# Patient Record
Sex: Male | Born: 1960
Health system: Southern US, Community
[De-identification: ages and names within clinical notes are randomized; demographics above are authoritative.]

## PROBLEM LIST (undated history)

## (undated) DIAGNOSIS — N529 Male erectile dysfunction, unspecified: Secondary | ICD-10-CM

## (undated) DIAGNOSIS — E8881 Metabolic syndrome: Secondary | ICD-10-CM

## (undated) DIAGNOSIS — R7303 Prediabetes: Secondary | ICD-10-CM

## (undated) DIAGNOSIS — N138 Other obstructive and reflux uropathy: Secondary | ICD-10-CM

## (undated) DIAGNOSIS — I2510 Atherosclerotic heart disease of native coronary artery without angina pectoris: Secondary | ICD-10-CM

## (undated) DIAGNOSIS — K5792 Diverticulitis of intestine, part unspecified, without perforation or abscess without bleeding: Secondary | ICD-10-CM

## (undated) DIAGNOSIS — T8859XA Other complications of anesthesia, initial encounter: Secondary | ICD-10-CM

## (undated) DIAGNOSIS — E7800 Pure hypercholesterolemia, unspecified: Secondary | ICD-10-CM

## (undated) DIAGNOSIS — J310 Chronic rhinitis: Secondary | ICD-10-CM

## (undated) DIAGNOSIS — Z7251 High risk heterosexual behavior: Secondary | ICD-10-CM

## (undated) DIAGNOSIS — C801 Malignant (primary) neoplasm, unspecified: Secondary | ICD-10-CM

## (undated) DIAGNOSIS — D573 Sickle-cell trait: Secondary | ICD-10-CM

## (undated) DIAGNOSIS — H269 Unspecified cataract: Secondary | ICD-10-CM

## (undated) DIAGNOSIS — F17200 Nicotine dependence, unspecified, uncomplicated: Secondary | ICD-10-CM

## (undated) DIAGNOSIS — N401 Enlarged prostate with lower urinary tract symptoms: Secondary | ICD-10-CM

## (undated) DIAGNOSIS — G4730 Sleep apnea, unspecified: Secondary | ICD-10-CM

## (undated) DIAGNOSIS — G4733 Obstructive sleep apnea (adult) (pediatric): Secondary | ICD-10-CM

## (undated) DIAGNOSIS — N400 Enlarged prostate without lower urinary tract symptoms: Secondary | ICD-10-CM

## (undated) DIAGNOSIS — M1990 Unspecified osteoarthritis, unspecified site: Secondary | ICD-10-CM

## (undated) DIAGNOSIS — D571 Sickle-cell disease without crisis: Secondary | ICD-10-CM

## (undated) HISTORY — DX: Chronic rhinitis: J31.0

## (undated) HISTORY — DX: Atherosclerotic heart disease of native coronary artery without angina pectoris: I25.10

## (undated) HISTORY — PX: HERNIA REPAIR: SHX51

## (undated) HISTORY — DX: Pure hypercholesterolemia, unspecified: E78.00

## (undated) HISTORY — DX: Metabolic syndrome: E88.810

## (undated) HISTORY — PX: INCISION AND DRAINAGE ABSCESS / HEMATOMA OF BURSA / KNEE / THIGH: SUR668

## (undated) HISTORY — DX: Metabolic syndrome: E88.81

## (undated) HISTORY — DX: Male erectile dysfunction, unspecified: N52.9

## (undated) HISTORY — PX: COSMETIC SURGERY: SHX468

## (undated) HISTORY — DX: Nicotine dependence, unspecified, uncomplicated: F17.200

## (undated) HISTORY — DX: Benign prostatic hyperplasia with lower urinary tract symptoms: N13.8

## (undated) HISTORY — DX: Obstructive sleep apnea (adult) (pediatric): G47.33

## (undated) HISTORY — DX: Benign prostatic hyperplasia with lower urinary tract symptoms: N40.1

## (undated) HISTORY — DX: Sleep apnea, unspecified: G47.30

## (undated) HISTORY — DX: High risk heterosexual behavior: Z72.51

## (undated) HISTORY — DX: Unspecified cataract: H26.9

## (undated) HISTORY — PX: CATARACT EXTRACTION: SUR2

---

## 1998-07-26 ENCOUNTER — Emergency Department (HOSPITAL_COMMUNITY): Admission: EM | Admit: 1998-07-26 | Discharge: 1998-07-26 | Payer: Self-pay | Admitting: Emergency Medicine

## 1999-10-14 ENCOUNTER — Encounter (INDEPENDENT_AMBULATORY_CARE_PROVIDER_SITE_OTHER): Payer: Self-pay | Admitting: *Deleted

## 1999-10-14 ENCOUNTER — Ambulatory Visit (HOSPITAL_BASED_OUTPATIENT_CLINIC_OR_DEPARTMENT_OTHER): Admission: RE | Admit: 1999-10-14 | Discharge: 1999-10-14 | Payer: Self-pay | Admitting: General Surgery

## 2001-08-03 ENCOUNTER — Observation Stay (HOSPITAL_COMMUNITY): Admission: EM | Admit: 2001-08-03 | Discharge: 2001-08-04 | Payer: Self-pay | Admitting: Emergency Medicine

## 2002-07-14 ENCOUNTER — Emergency Department (HOSPITAL_COMMUNITY): Admission: EM | Admit: 2002-07-14 | Discharge: 2002-07-14 | Payer: Self-pay | Admitting: Emergency Medicine

## 2003-12-10 ENCOUNTER — Ambulatory Visit (HOSPITAL_BASED_OUTPATIENT_CLINIC_OR_DEPARTMENT_OTHER): Admission: RE | Admit: 2003-12-10 | Discharge: 2003-12-10 | Payer: Self-pay | Admitting: General Surgery

## 2003-12-10 ENCOUNTER — Ambulatory Visit (HOSPITAL_COMMUNITY): Admission: RE | Admit: 2003-12-10 | Discharge: 2003-12-10 | Payer: Self-pay | Admitting: General Surgery

## 2004-01-07 ENCOUNTER — Emergency Department (HOSPITAL_COMMUNITY): Admission: EM | Admit: 2004-01-07 | Discharge: 2004-01-08 | Payer: Self-pay | Admitting: Emergency Medicine

## 2004-01-10 ENCOUNTER — Emergency Department (HOSPITAL_COMMUNITY): Admission: EM | Admit: 2004-01-10 | Discharge: 2004-01-11 | Payer: Self-pay

## 2004-01-11 ENCOUNTER — Emergency Department (HOSPITAL_COMMUNITY): Admission: EM | Admit: 2004-01-11 | Discharge: 2004-01-11 | Payer: Self-pay | Admitting: Emergency Medicine

## 2004-01-12 ENCOUNTER — Ambulatory Visit (HOSPITAL_COMMUNITY): Admission: RE | Admit: 2004-01-12 | Discharge: 2004-01-12 | Payer: Self-pay | Admitting: Internal Medicine

## 2004-02-07 ENCOUNTER — Emergency Department (HOSPITAL_COMMUNITY): Admission: EM | Admit: 2004-02-07 | Discharge: 2004-02-07 | Payer: Self-pay | Admitting: Emergency Medicine

## 2004-04-10 ENCOUNTER — Emergency Department (HOSPITAL_COMMUNITY): Admission: EM | Admit: 2004-04-10 | Discharge: 2004-04-10 | Payer: Self-pay | Admitting: Emergency Medicine

## 2004-08-20 ENCOUNTER — Ambulatory Visit (HOSPITAL_COMMUNITY): Admission: RE | Admit: 2004-08-20 | Discharge: 2004-08-20 | Payer: Self-pay | Admitting: General Surgery

## 2004-08-20 ENCOUNTER — Encounter (INDEPENDENT_AMBULATORY_CARE_PROVIDER_SITE_OTHER): Payer: Self-pay | Admitting: Specialist

## 2004-09-12 ENCOUNTER — Emergency Department (HOSPITAL_COMMUNITY): Admission: EM | Admit: 2004-09-12 | Discharge: 2004-09-12 | Payer: Self-pay | Admitting: Emergency Medicine

## 2004-09-13 ENCOUNTER — Observation Stay (HOSPITAL_COMMUNITY): Admission: AD | Admit: 2004-09-13 | Discharge: 2004-09-14 | Payer: Self-pay | Admitting: General Surgery

## 2005-06-14 ENCOUNTER — Ambulatory Visit: Payer: Self-pay | Admitting: Internal Medicine

## 2005-09-14 ENCOUNTER — Ambulatory Visit: Payer: Self-pay | Admitting: Internal Medicine

## 2005-10-07 ENCOUNTER — Ambulatory Visit: Payer: Self-pay | Admitting: Internal Medicine

## 2005-10-19 ENCOUNTER — Ambulatory Visit: Payer: Self-pay | Admitting: Internal Medicine

## 2005-10-19 ENCOUNTER — Ambulatory Visit: Payer: Self-pay

## 2005-10-26 ENCOUNTER — Ambulatory Visit: Payer: Self-pay | Admitting: *Deleted

## 2005-10-31 ENCOUNTER — Ambulatory Visit: Payer: Self-pay | Admitting: *Deleted

## 2005-11-04 ENCOUNTER — Inpatient Hospital Stay (HOSPITAL_BASED_OUTPATIENT_CLINIC_OR_DEPARTMENT_OTHER): Admission: RE | Admit: 2005-11-04 | Discharge: 2005-11-04 | Payer: Self-pay | Admitting: Cardiology

## 2005-11-04 ENCOUNTER — Ambulatory Visit: Payer: Self-pay | Admitting: Cardiology

## 2005-11-07 ENCOUNTER — Ambulatory Visit: Payer: Self-pay

## 2006-05-09 ENCOUNTER — Ambulatory Visit: Payer: Self-pay | Admitting: Endocrinology

## 2007-04-03 ENCOUNTER — Ambulatory Visit: Payer: Self-pay | Admitting: Internal Medicine

## 2007-04-03 LAB — CONVERTED CEMR LAB
Bilirubin, Direct: 0.1 mg/dL (ref 0.0–0.3)
Eosinophils Absolute: 0.2 10*3/uL (ref 0.0–0.6)
Eosinophils Relative: 2.9 % (ref 0.0–5.0)
GFR calc Af Amer: 94 mL/min
GFR calc non Af Amer: 78 mL/min
Glucose, Bld: 97 mg/dL (ref 70–99)
HCT: 43.6 % (ref 39.0–52.0)
Lymphocytes Relative: 21 % (ref 12.0–46.0)
MCV: 83.4 fL (ref 78.0–100.0)
Neutro Abs: 4.9 10*3/uL (ref 1.4–7.7)
Neutrophils Relative %: 64 % (ref 43.0–77.0)
Nitrite: NEGATIVE
PSA: 1.98 ng/mL (ref 0.10–4.00)
Platelets: 286 10*3/uL (ref 150–400)
Potassium: 4.2 meq/L (ref 3.5–5.1)
Sodium: 141 meq/L (ref 135–145)
Triglycerides: 131 mg/dL (ref 0–149)
Urine Glucose: NEGATIVE mg/dL
WBC: 7.6 10*3/uL (ref 4.5–10.5)

## 2007-04-13 ENCOUNTER — Ambulatory Visit: Payer: Self-pay | Admitting: Internal Medicine

## 2007-04-13 LAB — CONVERTED CEMR LAB

## 2007-05-23 ENCOUNTER — Ambulatory Visit: Payer: Self-pay | Admitting: Internal Medicine

## 2007-08-25 ENCOUNTER — Encounter: Payer: Self-pay | Admitting: *Deleted

## 2007-08-25 DIAGNOSIS — F528 Other sexual dysfunction not due to a substance or known physiological condition: Secondary | ICD-10-CM | POA: Insufficient documentation

## 2007-12-04 ENCOUNTER — Telehealth (INDEPENDENT_AMBULATORY_CARE_PROVIDER_SITE_OTHER): Payer: Self-pay | Admitting: *Deleted

## 2007-12-04 ENCOUNTER — Ambulatory Visit: Payer: Self-pay | Admitting: Internal Medicine

## 2007-12-04 DIAGNOSIS — J310 Chronic rhinitis: Secondary | ICD-10-CM | POA: Insufficient documentation

## 2008-04-04 ENCOUNTER — Ambulatory Visit: Payer: Self-pay | Admitting: Internal Medicine

## 2008-04-04 LAB — CONVERTED CEMR LAB
ALT: 23 units/L (ref 0–53)
AST: 20 units/L (ref 0–37)
Albumin: 4 g/dL (ref 3.5–5.2)
BUN: 9 mg/dL (ref 6–23)
Basophils Relative: 0.6 % (ref 0.0–1.0)
CO2: 30 meq/L (ref 19–32)
Chloride: 110 meq/L (ref 96–112)
Cholesterol: 187 mg/dL (ref 0–200)
Creatinine, Ser: 1.1 mg/dL (ref 0.4–1.5)
Eosinophils Absolute: 0.2 10*3/uL (ref 0.0–0.7)
Eosinophils Relative: 3.7 % (ref 0.0–5.0)
GFR calc non Af Amer: 77 mL/min
Glucose, Bld: 108 mg/dL — ABNORMAL HIGH (ref 70–99)
LDL Cholesterol: 131 mg/dL — ABNORMAL HIGH (ref 0–99)
MCV: 83.6 fL (ref 78.0–100.0)
Monocytes Relative: 11.4 % (ref 3.0–12.0)
Neutrophils Relative %: 53.9 % (ref 43.0–77.0)
PSA: 1.73 ng/mL (ref 0.10–4.00)
RBC: 5.13 M/uL (ref 4.22–5.81)
Specific Gravity, Urine: 1.025 (ref 1.000–1.03)
Total Protein, Urine: NEGATIVE mg/dL
Total Protein: 7.1 g/dL (ref 6.0–8.3)
Urine Glucose: NEGATIVE mg/dL
Urobilinogen, UA: 0.2 (ref 0.0–1.0)
WBC: 4.7 10*3/uL (ref 4.5–10.5)

## 2008-04-22 ENCOUNTER — Telehealth: Payer: Self-pay | Admitting: Internal Medicine

## 2008-05-02 ENCOUNTER — Encounter: Payer: Self-pay | Admitting: Internal Medicine

## 2008-07-25 ENCOUNTER — Ambulatory Visit: Payer: Self-pay | Admitting: Internal Medicine

## 2008-07-25 DIAGNOSIS — E785 Hyperlipidemia, unspecified: Secondary | ICD-10-CM | POA: Insufficient documentation

## 2008-07-29 ENCOUNTER — Ambulatory Visit: Payer: Self-pay | Admitting: Internal Medicine

## 2008-07-29 LAB — CONVERTED CEMR LAB
ALT: 45 units/L (ref 0–53)
Albumin: 3.4 g/dL — ABNORMAL LOW (ref 3.5–5.2)
Alkaline Phosphatase: 171 units/L — ABNORMAL HIGH (ref 39–117)
BUN: 13 mg/dL (ref 6–23)
Bilirubin, Direct: 0.1 mg/dL (ref 0.0–0.3)
CO2: 29 meq/L (ref 19–32)
Calcium: 8.8 mg/dL (ref 8.4–10.5)
Eosinophils Relative: 5.6 % — ABNORMAL HIGH (ref 0.0–5.0)
GFR calc Af Amer: 104 mL/min
Glucose, Bld: 127 mg/dL — ABNORMAL HIGH (ref 70–99)
HCT: 42.2 % (ref 39.0–52.0)
Hemoglobin: 13.9 g/dL (ref 13.0–17.0)
Lymphocytes Relative: 19.8 % (ref 12.0–46.0)
Monocytes Absolute: 0.7 10*3/uL (ref 0.1–1.0)
Monocytes Relative: 11.9 % (ref 3.0–12.0)
Neutro Abs: 3.4 10*3/uL (ref 1.4–7.7)
Potassium: 4 meq/L (ref 3.5–5.1)
RBC: 4.97 M/uL (ref 4.22–5.81)
Total CHOL/HDL Ratio: 8.1
Total Protein: 7.5 g/dL (ref 6.0–8.3)
VLDL: 15 mg/dL (ref 0–40)
WBC: 5.6 10*3/uL (ref 4.5–10.5)

## 2008-07-31 ENCOUNTER — Ambulatory Visit: Payer: Self-pay | Admitting: Internal Medicine

## 2008-07-31 DIAGNOSIS — N401 Enlarged prostate with lower urinary tract symptoms: Secondary | ICD-10-CM | POA: Insufficient documentation

## 2008-07-31 DIAGNOSIS — I2510 Atherosclerotic heart disease of native coronary artery without angina pectoris: Secondary | ICD-10-CM | POA: Insufficient documentation

## 2008-07-31 DIAGNOSIS — R351 Nocturia: Secondary | ICD-10-CM | POA: Insufficient documentation

## 2008-07-31 DIAGNOSIS — F17200 Nicotine dependence, unspecified, uncomplicated: Secondary | ICD-10-CM | POA: Insufficient documentation

## 2008-07-31 LAB — CONVERTED CEMR LAB
Bacteria, UA: NEGATIVE
Bilirubin Urine: NEGATIVE
Crystals: NEGATIVE
Hemoglobin, Urine: NEGATIVE
Squamous Epithelial / LPF: NEGATIVE /lpf
TSH: 1.04 microintl units/mL (ref 0.35–5.50)
Total Protein, Urine: NEGATIVE mg/dL
Urine Glucose: NEGATIVE mg/dL
Urobilinogen, UA: 0.2 (ref 0.0–1.0)
WBC, UA: NONE SEEN cells/hpf

## 2008-08-05 ENCOUNTER — Encounter: Payer: Self-pay | Admitting: Internal Medicine

## 2008-08-18 ENCOUNTER — Ambulatory Visit: Payer: Self-pay | Admitting: Internal Medicine

## 2008-08-18 ENCOUNTER — Encounter: Payer: Self-pay | Admitting: Internal Medicine

## 2008-08-18 LAB — CONVERTED CEMR LAB
ALT: 20 units/L (ref 0–53)
Albumin: 4.1 g/dL (ref 3.5–5.2)
Bilirubin, Direct: 0.2 mg/dL (ref 0.0–0.3)
Cholesterol: 132 mg/dL (ref 0–200)
HDL: 34.4 mg/dL — ABNORMAL LOW (ref >39.0)
LDL Cholesterol: 74 mg/dL (ref 0–99)
Total Protein: 7.7 g/dL (ref 6.0–8.3)
Triglycerides: 119 mg/dL (ref 0–149)
VLDL: 24 mg/dL (ref 0–40)

## 2008-09-15 ENCOUNTER — Encounter: Payer: Self-pay | Admitting: Internal Medicine

## 2008-10-03 ENCOUNTER — Telehealth: Payer: Self-pay | Admitting: Internal Medicine

## 2008-11-28 HISTORY — PX: PTCA: SHX146

## 2008-12-18 ENCOUNTER — Ambulatory Visit: Payer: Self-pay | Admitting: Internal Medicine

## 2008-12-18 DIAGNOSIS — E119 Type 2 diabetes mellitus without complications: Secondary | ICD-10-CM | POA: Insufficient documentation

## 2008-12-18 DIAGNOSIS — E8881 Metabolic syndrome: Secondary | ICD-10-CM | POA: Insufficient documentation

## 2008-12-18 LAB — CONVERTED CEMR LAB
AST: 22 units/L (ref 0–37)
Albumin: 4 g/dL (ref 3.5–5.2)
Alkaline Phosphatase: 94 units/L (ref 39–117)
BUN: 9 mg/dL (ref 6–23)
Basophils Relative: 1.3 % (ref 0.0–3.0)
Chloride: 109 meq/L (ref 96–112)
Cholesterol, target level: 200 mg/dL
Creatinine, Ser: 1.1 mg/dL (ref 0.4–1.5)
Creatinine,U: 144.1 mg/dL
Eosinophils Relative: 3.5 % (ref 0.0–5.0)
Glucose, Bld: 97 mg/dL (ref 70–99)
HCT: 43 % (ref 39.0–52.0)
HDL goal, serum: 40 mg/dL
HDL: 35.9 mg/dL — ABNORMAL LOW (ref >39.0)
LDL Goal: 70 mg/dL
Monocytes Absolute: 0.6 10*3/uL (ref 0.1–1.0)
Monocytes Relative: 12.2 % — ABNORMAL HIGH (ref 3.0–12.0)
Neutrophils Relative %: 54.2 % (ref 43.0–77.0)
Platelets: 243 10*3/uL (ref 150–400)
Potassium: 4.5 meq/L (ref 3.5–5.1)
RBC: 5.11 M/uL (ref 4.22–5.81)
Total CHOL/HDL Ratio: 5.4
Total Protein: 7.4 g/dL (ref 6.0–8.3)
WBC: 5.1 10*3/uL (ref 4.5–10.5)

## 2008-12-19 ENCOUNTER — Encounter: Payer: Self-pay | Admitting: Internal Medicine

## 2008-12-19 ENCOUNTER — Telehealth: Payer: Self-pay | Admitting: Internal Medicine

## 2008-12-31 ENCOUNTER — Ambulatory Visit: Payer: Self-pay | Admitting: Pulmonary Disease

## 2008-12-31 DIAGNOSIS — G4733 Obstructive sleep apnea (adult) (pediatric): Secondary | ICD-10-CM | POA: Insufficient documentation

## 2009-01-07 ENCOUNTER — Ambulatory Visit (HOSPITAL_BASED_OUTPATIENT_CLINIC_OR_DEPARTMENT_OTHER): Admission: RE | Admit: 2009-01-07 | Discharge: 2009-01-07 | Payer: Self-pay | Admitting: Pulmonary Disease

## 2009-01-07 ENCOUNTER — Encounter: Payer: Self-pay | Admitting: Pulmonary Disease

## 2009-01-14 ENCOUNTER — Ambulatory Visit: Payer: Self-pay | Admitting: Pulmonary Disease

## 2009-02-04 ENCOUNTER — Ambulatory Visit: Payer: Self-pay | Admitting: Pulmonary Disease

## 2009-04-01 ENCOUNTER — Ambulatory Visit: Payer: Self-pay | Admitting: Internal Medicine

## 2009-04-13 ENCOUNTER — Ambulatory Visit: Payer: Self-pay | Admitting: Internal Medicine

## 2009-05-18 ENCOUNTER — Telehealth: Payer: Self-pay | Admitting: Internal Medicine

## 2009-12-09 ENCOUNTER — Ambulatory Visit: Payer: Self-pay | Admitting: Internal Medicine

## 2009-12-10 ENCOUNTER — Encounter: Payer: Self-pay | Admitting: Internal Medicine

## 2009-12-11 ENCOUNTER — Ambulatory Visit: Payer: Self-pay | Admitting: Internal Medicine

## 2009-12-11 ENCOUNTER — Telehealth (INDEPENDENT_AMBULATORY_CARE_PROVIDER_SITE_OTHER): Payer: Self-pay | Admitting: *Deleted

## 2009-12-11 LAB — CONVERTED CEMR LAB
AST: 36 units/L (ref 0–37)
Alkaline Phosphatase: 105 units/L (ref 39–117)
Basophils Relative: 2.3 % (ref 0.0–3.0)
Bilirubin, Direct: 0.1 mg/dL (ref 0.0–0.3)
CO2: 16 meq/L — ABNORMAL LOW (ref 19–32)
Calcium: 9 mg/dL (ref 8.4–10.5)
Creatinine, Ser: 1.22 mg/dL (ref 0.40–1.50)
Eosinophils Relative: 5.2 % — ABNORMAL HIGH (ref 0.0–5.0)
GC Probe Amp, Urine: NEGATIVE
HCT: 43.2 % (ref 39.0–52.0)
HDL: 39 mg/dL — ABNORMAL LOW (ref >39)
Hemoglobin: 13.8 g/dL (ref 13.0–17.0)
Hgb A1c MFr Bld: 6.1 % (ref 4.6–6.5)
Ketones, ur: NEGATIVE mg/dL
Leukocytes, UA: NEGATIVE
Lymphs Abs: 1.4 10*3/uL (ref 0.7–4.0)
Monocytes Relative: 9.7 % (ref 3.0–12.0)
Neutro Abs: 4.1 10*3/uL (ref 1.4–7.7)
PSA: 2.08 ng/mL (ref 0.10–4.00)
Prolactin: 6.3 ng/mL
RBC: 4.98 M/uL (ref 4.22–5.81)
Specific Gravity, Urine: 1.02 (ref 1.000–1.030)
TSH: 1.67 microintl units/mL (ref 0.35–5.50)
Total Bilirubin: 0.3 mg/dL (ref 0.3–1.2)
Urobilinogen, UA: 0.2 (ref 0.0–1.0)
WBC: 6.5 10*3/uL (ref 4.5–10.5)

## 2009-12-13 ENCOUNTER — Encounter: Payer: Self-pay | Admitting: Internal Medicine

## 2010-03-01 ENCOUNTER — Encounter: Payer: Self-pay | Admitting: Internal Medicine

## 2010-06-30 ENCOUNTER — Emergency Department (HOSPITAL_COMMUNITY): Admission: EM | Admit: 2010-06-30 | Discharge: 2010-06-30 | Payer: Self-pay | Admitting: Emergency Medicine

## 2010-07-01 ENCOUNTER — Observation Stay (HOSPITAL_COMMUNITY): Admission: EM | Admit: 2010-07-01 | Discharge: 2010-07-02 | Payer: Self-pay | Admitting: Emergency Medicine

## 2010-07-01 ENCOUNTER — Ambulatory Visit: Payer: Self-pay | Admitting: Internal Medicine

## 2010-07-01 ENCOUNTER — Encounter: Payer: Self-pay | Admitting: Internal Medicine

## 2010-07-01 ENCOUNTER — Ambulatory Visit: Payer: Self-pay | Admitting: Cardiology

## 2010-07-01 DIAGNOSIS — R079 Chest pain, unspecified: Secondary | ICD-10-CM | POA: Insufficient documentation

## 2010-07-01 DIAGNOSIS — R9431 Abnormal electrocardiogram [ECG] [EKG]: Secondary | ICD-10-CM | POA: Insufficient documentation

## 2010-07-05 ENCOUNTER — Telehealth (INDEPENDENT_AMBULATORY_CARE_PROVIDER_SITE_OTHER): Payer: Self-pay | Admitting: *Deleted

## 2010-07-05 ENCOUNTER — Ambulatory Visit: Payer: Self-pay | Admitting: Internal Medicine

## 2010-07-06 ENCOUNTER — Encounter: Payer: Self-pay | Admitting: Internal Medicine

## 2010-07-08 ENCOUNTER — Telehealth (INDEPENDENT_AMBULATORY_CARE_PROVIDER_SITE_OTHER): Payer: Self-pay | Admitting: *Deleted

## 2010-07-08 ENCOUNTER — Encounter: Payer: Self-pay | Admitting: Cardiology

## 2010-07-12 ENCOUNTER — Ambulatory Visit: Payer: Self-pay | Admitting: Cardiology

## 2010-07-12 ENCOUNTER — Encounter (INDEPENDENT_AMBULATORY_CARE_PROVIDER_SITE_OTHER): Payer: Self-pay | Admitting: *Deleted

## 2010-07-12 ENCOUNTER — Encounter (HOSPITAL_COMMUNITY): Admission: RE | Admit: 2010-07-12 | Discharge: 2010-08-20 | Payer: Self-pay | Admitting: Cardiology

## 2010-07-12 ENCOUNTER — Ambulatory Visit: Payer: Self-pay

## 2010-07-12 ENCOUNTER — Telehealth (INDEPENDENT_AMBULATORY_CARE_PROVIDER_SITE_OTHER): Payer: Self-pay

## 2010-07-13 ENCOUNTER — Telehealth: Payer: Self-pay | Admitting: Internal Medicine

## 2010-07-14 ENCOUNTER — Encounter (INDEPENDENT_AMBULATORY_CARE_PROVIDER_SITE_OTHER): Payer: Self-pay | Admitting: *Deleted

## 2010-07-14 ENCOUNTER — Encounter: Payer: Self-pay | Admitting: Internal Medicine

## 2010-07-14 ENCOUNTER — Telehealth: Payer: Self-pay | Admitting: Internal Medicine

## 2010-12-30 NOTE — Letter (Signed)
Summary: [facility]  [facility]   Imported By: Sherian Rein 03/16/2010 12:01:13  _____________________________________________________________________  External Attachment:    Type:   Image     Comment:   External Document

## 2010-12-30 NOTE — Assessment & Plan Note (Signed)
Summary: post hosp/cd   Vital Signs:  Patient profile:   50 year old male Height:      74 inches Weight:      238 pounds BMI:     30.67 O2 Sat:      98 % on Room air Temp:     98.2 degrees F oral Pulse rate:   80 / minute Pulse rhythm:   regular Resp:     16 per minute BP sitting:   140 / 90  (left arm) Cuff size:   large  Vitals Entered By: Rock Nephew CMA (July 05, 2010 1:59 PM)  Nutrition Counseling: Patient's BMI is greater than 25 and therefore counseled on weight management options.  O2 Flow:  Room air CC: Hospital follow up, Lipid Management Is Patient Diabetic? No Pain Assessment Patient in pain? no        Primary Care Provider:  Etta Grandchild MD  CC:  Hospital follow up and Lipid Management.  History of Present Illness:  Follow-Up Visit      This is a 50 year old man who presents for Follow-up visit.  The patient denies chest pain, palpitations, dizziness, syncope, low blood sugar symptoms, high blood sugar symptoms, edema, SOB, DOE, PND, and orthopnea.  Since the last visit the patient notes a recent hospitilization.  The patient reports not taking meds as prescribed, not monitoring BP, not monitoring blood sugars, and dietary noncompliance.  When questioned about possible medication side effects, the patient notes none.    Lipid Management History:      Positive NCEP/ATP III risk factors include male age 48 years old or older, diabetes, HDL cholesterol less than 40, current tobacco user, and ASHD (either angina/prior MI/prior CABG).  Negative NCEP/ATP III risk factors include no family history for ischemic heart disease, non-hypertensive, no prior stroke/TIA, no peripheral vascular disease, and no history of aortic aneurysm.        The patient states that he knows about the "Therapeutic Lifestyle Change" diet.  His compliance with the TLC diet is not at all.  The patient expresses understanding of adjunctive measures for cholesterol lowering.  Adjunctive  measures started by the patient include aerobic exercise, fiber, ASA, limit alcohol consumpton, and weight reduction.  He expresses no side effects from his lipid-lowering medication.  The patient denies any symptoms to suggest myopathy or liver disease.     Preventive Screening-Counseling & Management  Alcohol-Tobacco     Alcohol drinks/day: <1     Alcohol type: all     >5/day in last 3 mos: no     Alcohol Counseling: not indicated; use of alcohol is not excessive or problematic     Feels need to cut down: no     Feels annoyed by complaints: no     Feels guilty re: drinking: no     Needs 'eye opener' in am: no     Smoking Status: current     Smoking Cessation Counseling: yes     Smoke Cessation Stage: precontemplative     Packs/Day: 1.0     Year Started: 1979     Pack years: 19     Cans of tobacco/week: no     Passive Smoke Exposure: no     Tobacco Counseling: to quit use of tobacco products  Hep-HIV-STD-Contraception     Hepatitis Risk: no risk noted     HIV Risk: no     STD Risk: no risk noted      Sexual  History:  currently monogamous.        Drug Use:  never.        Blood Transfusions:  no.    Medications Prior to Update: 1)  Ambien Cr 12.5 Mg Cr-Tabs (Zolpidem Tartrate) .... Onepo At Bedtime As Needed For Insomnia 2)  Lipitor 20 Mg Tabs (Atorvastatin Calcium) .... Take 1 Tablet By Mouth Once A Day 3)  Cialis 20 Mg Tabs (Tadalafil) .... One By Mouth Q 3-4 Days As Directed  Current Medications (verified): 1)  Ambien Cr 12.5 Mg Cr-Tabs (Zolpidem Tartrate) .... Onepo At Bedtime As Needed For Insomnia 2)  Lipitor 20 Mg Tabs (Atorvastatin Calcium) .... Take 1 Tablet By Mouth Once A Day 3)  Cialis 20 Mg Tabs (Tadalafil) .... One By Mouth Q 3-4 Days As Directed  Allergies (verified): No Known Drug Allergies  Past History:  Past Medical History: Last updated: 12/29/2009 Current Problems:  CORONARY ARTERY DISEASE (ICD-414.00) HYPERCHOLESTEROLEMIA (ICD-272.0) TOBACCO  ABUSE (ICD-305.1) DIABETES MELLITUS, TYPE II (ICD-250.00) OBSTRUCTIVE SLEEP APNEA (ICD-327.23) SEXUAL ACTIVITY, HIGH RISK (ICD-V69.2) CELLULITIS AND ABSCESS OF UNSPECIFIED SITE (ICD-682.9) DYSMETABOLIC SYNDROME (ICD-277.7) ROUTINE GENERAL MEDICAL EXAM@HEALTH  CARE FACL (ICD-V70.0) FAMILY HISTORY OF COLON CA 1ST DEGREE RELATIVE <60 (ICD-V16.0) HYPERTROPHY PROSTATE W/UR OBST & OTH LUTS (ICD-600.01) CHRONIC RHINITIS (ICD-472.0) ERECTILE DYSFUNCTION (ICD-302.72)  Past Surgical History: Last updated: 12/29/2009 Percutaneous transluminal coronary angioplasty  Incision and drainage of complex abscess, left axilla.  Incision and drainage of large, infected, pilonidal abscess.  Excision of a large area of hidradenitis and excision of multiple small areas, left groin, and bilateral thighs.  Family History: Last updated: 12/29/2009 Family History of Colon CA 1st degree relative <60 heart disease: maternal grandfather, father's side cancer: mother (breast), maternal grandfather (colon)   Social History: Last updated: 12/29/2009 Current Smoker. 1 ppd.  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   Alcohol use-no Drug use-no Regular exercise-no  Risk Factors: Alcohol Use: <1 (07/05/2010) >5 drinks/d w/in last 3 months: no (07/05/2010) Exercise: no (07/01/2010)  Risk Factors: Smoking Status: current (07/05/2010) Packs/Day: 1.0 (07/05/2010) Cans of tobacco/wk: no (07/05/2010) Passive Smoke Exposure: no (07/05/2010)  Family History: Reviewed history from 12/29/2009 and no changes required. Family History of Colon CA 1st degree relative <60 heart disease: maternal grandfather, father's side cancer: mother (breast), maternal grandfather (colon)   Social History: Reviewed history from 12/29/2009 and no changes required. Current Smoker. 1 ppd.  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   Alcohol  use-no Drug use-no Regular exercise-no  Review of Systems  The patient denies anorexia, fever, weight loss, chest pain, syncope, dyspnea on exertion, peripheral edema, prolonged cough, headaches, hemoptysis, abdominal pain, hematuria, difficulty walking, and depression.    Physical Exam  General:  alert, well-developed, well-nourished, well-hydrated, normal appearance, healthy-appearing, cooperative to examination, good hygiene, and overweight-appearing.   Head:  normocephalic and atraumatic.   Mouth:  Oral mucosa and oropharynx without lesions or exudates.  Teeth in good repair. Neck:  supple, full ROM, no masses, no thyromegaly, no thyroid nodules or tenderness, no JVD, normal carotid upstroke, no carotid bruits, no cervical lymphadenopathy, and no neck tenderness.   Lungs:  normal respiratory effort, no intercostal retractions, no accessory muscle use, normal breath sounds, no dullness, no fremitus, no crackles, and no wheezes.   Heart:  normal rate, regular rhythm, no murmur, no gallop, no rub, and no JVD.   Abdomen:  soft, non-tender, normal bowel sounds, no distention, no  masses, no guarding, no rigidity, no rebound tenderness, no hepatomegaly, and no splenomegaly.   Msk:  normal ROM, no joint tenderness, no joint swelling, no joint warmth, no redness over joints, no joint deformities, no joint instability, and no crepitation.   Pulses:  R and L carotid,radial,femoral,dorsalis pedis and posterior tibial pulses are full and equal bilaterally Extremities:  No clubbing, cyanosis, edema, or deformity noted with normal full range of motion of all joints.   Neurologic:  alert & oriented X3, cranial nerves II-XII intact, strength normal in all extremities, sensation intact to light touch, gait normal, and DTRs symmetrical and normal.   Skin:  turgor normal, color normal, no rashes, no suspicious lesions, no ecchymoses, no petechiae, no purpura, no ulcerations, and no edema.  tattoo(s).    Cervical Nodes:  no anterior cervical adenopathy and no posterior cervical adenopathy.   Psych:  Cognition and judgment appear intact. Alert and cooperative with normal attention span and concentration. No apparent delusions, illusions, hallucinations  Diabetes Management Exam:    Foot Exam (with socks and/or shoes not present):       Sensory-Pinprick/Light touch:          Left medial foot (L-4): normal          Left dorsal foot (L-5): normal          Left lateral foot (S-1): normal          Right medial foot (L-4): normal          Right dorsal foot (L-5): normal          Right lateral foot (S-1): normal       Sensory-Monofilament:          Left foot: normal          Right foot: normal       Inspection:          Left foot: normal          Right foot: normal       Nails:          Left foot: normal          Right foot: normal   Impression & Recommendations:  Problem # 1:  CHEST PAIN (ICD-786.50) Assessment Improved  Problem # 2:  CORONARY ARTERY DISEASE (ICD-414.00) Assessment: Unchanged  Orders: Cardiology Referral (Cardiology)  Complete Medication List: 1)  Ambien Cr 12.5 Mg Cr-tabs (Zolpidem tartrate) .... Onepo at bedtime as needed for insomnia 2)  Lipitor 20 Mg Tabs (Atorvastatin calcium) .... Take 1 tablet by mouth once a day 3)  Cialis 20 Mg Tabs (Tadalafil) .... One by mouth q 3-4 days as directed  Lipid Assessment/Plan:      Based on NCEP/ATP III, the patient's risk factor category is "history of coronary disease, peripheral vascular disease, cerebrovascular disease, or aortic aneurysm along with either diabetes, current smoker, or LDL > 130 plus HDL < 40 plus triglycerides > 200".  The patient's lipid goals are as follows: Total cholesterol goal is 200; LDL cholesterol goal is 70; HDL cholesterol goal is 40; Triglyceride goal is 150.    Patient Instructions: 1)  Please schedule a follow-up appointment in 2 weeks. 2)  Tobacco is very bad for your health and your  loved ones! You Should stop smoking!. 3)  Stop Smoking Tips: Choose a Quit date. Cut down before the Quit date. decide what you will do as a substitute when you feel the urge to smoke(gum,toothpick,exercise). 4)  Check your Blood Pressure regularly.  If it is above 130/80: you should make an appointment.

## 2010-12-30 NOTE — Letter (Signed)
Summary: [facility]   Imported By: Lester Columbiana 07/09/2010 08:10:44  _____________________________________________________________________  External Attachment:    Type:   Image     Comment:   External Document

## 2010-12-30 NOTE — Progress Notes (Signed)
Summary: Work clearance  Phone Note Outgoing Call Call back at [phone]   Call placed by: Irean Hong, RN,  July 12, 2010 4:14 PM Summary of Call: Spoke with Dr. Yetta Barre office in regards to the patient needing to be cleared for work per patient. The myoview to be faxed to Dr. Yetta Barre office tomorrow. Patsy Edwards,RN.

## 2010-12-30 NOTE — Letter (Signed)
Summary: Out of Work  [facility]  [address] [place], [place] [address]   Phone: [phone]  Fax: [phone]    July 05, 2010   Employee:  [name]    To Whom It May Concern:   For Medical reasons, please excuse the above named employee from work for the following dates:  Start:   06/26/2010  End:   07/05/2010   Patient is able to return to work with no restrictions    If you need additional information, please feel free to contact our office.         Sincerely,    Ronnette Juniper.D.

## 2010-12-30 NOTE — Letter (Signed)
Summary: Generic Letter  [facility]  [address] [place], [place] [address]   Phone: [phone]  Fax: [phone]    07/14/2010    ROSAIRE CUETO [address] [place], [place]  [address]    To Whom It May Concern:   Rayetta Pigg had a Nuclear Stress Test with [facility] on   07/08/2010. Patient is now medically cleared to return to work with no   restrictions. Please feel free to contact our office with any future   questions.           Sincerely,     Sanda Linger, M.D.

## 2010-12-30 NOTE — Letter (Signed)
Summary: Outpatient Coinsurance Notice  Outpatient Coinsurance Notice   Imported By: Marylou Mccoy 07/23/2010 11:59:53  _____________________________________________________________________  External Attachment:    Type:   Image     Comment:   External Document

## 2010-12-30 NOTE — Assessment & Plan Note (Signed)
Summary: CHEST PAIN-REFUSED ER-[facility]   Vital Signs:  Patient profile:   50 year old male Height:      74 inches Weight:      239 pounds O2 Sat:      96 % on Room air Temp:     98.2 degrees F oral Pulse rate:   72 / minute Pulse rhythm:   regular Resp:     16 per minute BP sitting:   130 / 78  O2 Flow:  Room air  Primary Care Provider:  Etta Grandchild MD   History of Present Illness: He returns today for f/up after leaving the ER AMA yesterday. He has had CP, DOE, and some nausea for two weeks. He had a 40% occlusion of LAD in 2006 and since then he has not done well with compliance and lifestyle.  Preventive Screening-Counseling & Management  Alcohol-Tobacco     Alcohol drinks/day: <1     Alcohol type: all     >5/day in last 3 mos: no     Alcohol Counseling: not indicated; use of alcohol is not excessive or problematic     Feels need to cut down: no     Feels annoyed by complaints: no     Feels guilty re: drinking: no     Needs 'eye opener' in am: no     Smoking Status: current     Smoking Cessation Counseling: yes     Smoke Cessation Stage: precontemplative     Packs/Day: 1.0     Year Started: 1979     Cans of tobacco/week: no     Passive Smoke Exposure: no     Tobacco Counseling: to quit use of tobacco products  Caffeine-Diet-Exercise     Does Patient Exercise: no  Hep-HIV-STD-Contraception     Hepatitis Risk: no risk noted     HIV Risk: no     STD Risk: no risk noted  Current Medications (verified): 1)  Ambien Cr 12.5 Mg Cr-Tabs (Zolpidem Tartrate) .... Onepo At Bedtime As Needed For Insomnia 2)  Lipitor 20 Mg Tabs (Atorvastatin Calcium) .... Take 1 Tablet By Mouth Once A Day 3)  Cialis 20 Mg Tabs (Tadalafil) .... One By Mouth Q 3-4 Days As Directed  Allergies (verified): No Known Drug Allergies  Past History:  Past Medical History: Last updated: 12/29/2009 Current Problems:  CORONARY ARTERY DISEASE (ICD-414.00) HYPERCHOLESTEROLEMIA  (ICD-272.0) TOBACCO ABUSE (ICD-305.1) DIABETES MELLITUS, TYPE II (ICD-250.00) OBSTRUCTIVE SLEEP APNEA (ICD-327.23) SEXUAL ACTIVITY, HIGH RISK (ICD-V69.2) CELLULITIS AND ABSCESS OF UNSPECIFIED SITE (ICD-682.9) DYSMETABOLIC SYNDROME (ICD-277.7) ROUTINE GENERAL MEDICAL EXAM@HEALTH  CARE FACL (ICD-V70.0) FAMILY HISTORY OF COLON CA 1ST DEGREE RELATIVE <60 (ICD-V16.0) HYPERTROPHY PROSTATE W/UR OBST & OTH LUTS (ICD-600.01) CHRONIC RHINITIS (ICD-472.0) ERECTILE DYSFUNCTION (ICD-302.72)  Past Surgical History: Last updated: 12/29/2009 Percutaneous transluminal coronary angioplasty  Incision and drainage of complex abscess, left axilla.  Incision and drainage of large, infected, pilonidal abscess.  Excision of a large area of hidradenitis and excision of multiple small areas, left groin, and bilateral thighs.  Family History: Last updated: 12/29/2009 Family History of Colon CA 1st degree relative <60 heart disease: maternal grandfather, father's side cancer: mother (breast), maternal grandfather (colon)   Social History: Last updated: 12/29/2009 Current Smoker. 1 ppd.  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   Alcohol use-no Drug use-no Regular exercise-no  Risk Factors: Alcohol Use: <1 (07/01/2010) >5 drinks/d w/in last 3 months: no (07/01/2010) Exercise: no (07/01/2010)  Risk Factors: Smoking  Status: current (07/01/2010) Packs/Day: 1.0 (07/01/2010) Cans of tobacco/wk: no (07/01/2010) Passive Smoke Exposure: no (07/01/2010)  Family History: Reviewed history from 12/29/2009 and no changes required. Family History of Colon CA 1st degree relative <60 heart disease: maternal grandfather, father's side cancer: mother (breast), maternal grandfather (colon)   Social History: Reviewed history from 12/29/2009 and no changes required. Current Smoker. 1 ppd.  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    Alcohol use-no Drug use-no Regular exercise-no  Review of Systems       The patient complains of weight gain, chest pain, dyspnea on exertion, and peripheral edema.  The patient denies anorexia, fever, weight loss, syncope, prolonged cough, headaches, hemoptysis, abdominal pain, hematuria, difficulty walking, and depression.    Physical Exam  General:  alert, well-developed, well-nourished, well-hydrated, normal appearance, healthy-appearing, cooperative to examination, good hygiene, and overweight-appearing.   Head:  normocephalic and atraumatic.   Mouth:  Oral mucosa and oropharynx without lesions or exudates.  Teeth in good repair. Neck:  supple, full ROM, no masses, no thyromegaly, no thyroid nodules or tenderness, no JVD, normal carotid upstroke, no carotid bruits, no cervical lymphadenopathy, and no neck tenderness.   Lungs:  normal respiratory effort, no intercostal retractions, no accessory muscle use, normal breath sounds, no dullness, no fremitus, no crackles, and no wheezes.   Heart:  normal rate, regular rhythm, no murmur, no gallop, no rub, and no JVD.   Abdomen:  soft, non-tender, normal bowel sounds, no distention, no masses, no guarding, no rigidity, no rebound tenderness, no hepatomegaly, and no splenomegaly.   Msk:  normal ROM, no joint tenderness, no joint swelling, no joint warmth, no redness over joints, no joint deformities, no joint instability, and no crepitation.   Pulses:  R and L carotid,radial,femoral,dorsalis pedis and posterior tibial pulses are full and equal bilaterally Extremities:  No clubbing, cyanosis, edema, or deformity noted with normal full range of motion of all joints.   Neurologic:  alert & oriented X3, cranial nerves II-XII intact, strength normal in all extremities, sensation intact to light touch, gait normal, and DTRs symmetrical and normal.   Skin:  turgor normal, color normal, no rashes, no suspicious lesions, no ecchymoses, no petechiae, no  purpura, no ulcerations, and no edema.  tattoo(s).   Cervical Nodes:  no anterior cervical adenopathy and no posterior cervical adenopathy.   Axillary Nodes:  no R axillary adenopathy and no L axillary adenopathy.   Inguinal Nodes:  no R inguinal adenopathy and no L inguinal adenopathy.   Psych:  Cognition and judgment appear intact. Alert and cooperative with normal attention span and concentration. No apparent delusions, illusions, hallucinations Additional Exam:  EKG today shows some ventricular ectopy with inverted T waves in V1 and V2 and poor R waves in V1 and V2, possible Q wave in V2. These changes are new compared to yesterday.   Impression & Recommendations:  Problem # 1:  CORONARY ARTERY DISEASE (ICD-414.00) Assessment Deteriorated  Orders: EKG w/ Interpretation (93000)  Problem # 2:  ABNORMAL ELECTROCARDIOGRAM (ICD-794.31) Assessment: New he agrees to return to the hospital today to be re-evaluated since he has a known hx. of CAD. Orders: EKG w/ Interpretation (93000)  Problem # 3:  CHEST PAIN (ICD-786.50) this is very suspicious for ischemia, he will return to [facility] ER today. Orders: EKG w/ Interpretation (93000)  Complete Medication List: 1)  Ambien Cr 12.5 Mg Cr-tabs (Zolpidem tartrate) .... Onepo at bedtime as needed for insomnia 2)  Lipitor 20 Mg Tabs (Atorvastatin calcium) .... Take 1 tablet by mouth once a day 3)  Cialis 20 Mg Tabs (Tadalafil) .... One by mouth q 3-4 days as directed  Patient Instructions: 1)  Please schedule a follow-up appointment in 2 weeks. 2)  Tobacco is very bad for your health and your loved ones! You Should stop smoking!. 3)  Stop Smoking Tips: Choose a Quit date. Cut down before the Quit date. decide what you will do as a substitute when you feel the urge to smoke(gum,toothpick,exercise).

## 2010-12-30 NOTE — Assessment & Plan Note (Signed)
Summary: PAINFUL BOIL X2 DAYS/?LANCING/KB   Vital Signs:  Patient profile:   50 year old male Height:      74 inches Weight:      245 pounds O2 Sat:      98 % on Room air Temp:     98.2 degrees F oral Pulse rate:   80 / minute Pulse rhythm:   regular Resp:     16 per minute BP sitting:   140 / 90  (right arm)  O2 Flow:  Room air  Primary Care Provider:  Etta Grandchild MD   History of Present Illness: He returns c/o a painful, enlarging boil in his pubic area for 2 weeks. It has intermittently drained bloody pus-like fluid.  Preventive Screening-Counseling & Management  Alcohol-Tobacco     Alcohol drinks/day: <1     Smoking Status: current     Smoking Cessation Counseling: yes     Packs/Day: 1.0     Year Started: 1979     Cans of tobacco/week: no     Passive Smoke Exposure: no  Hep-HIV-STD-Contraception     Hepatitis Risk: no risk noted     HIV Risk: no     STD Risk: no risk noted      Sexual History:  currently monogamous.    Medications Prior to Update: 1)  Ambien Cr 12.5 Mg Cr-Tabs (Zolpidem Tartrate) .... Onepo At Bedtime As Needed For Insomnia 2)  Viagra 50 Mg Tabs (Sildenafil Citrate) .... Take As Directed 3)  Lipitor 20 Mg Tabs (Atorvastatin Calcium) .... Take 1 Tablet By Mouth Once A Day 4)  Doxycycline Monohydrate 100 Mg Caps (Doxycycline Monohydrate) .... One By Mouth Two Times A Day For 14 Days 5)  Nucynta 50 Mg Tabs (Tapentadol Hcl) .... One By Mouth Qid As Needed For Headache  Allergies (verified): No Known Drug Allergies  Past History:  Past Medical History: Reviewed history from 12/18/2008 and no changes required. Depression Tobacco Abuse Coronary artery disease Hyperlipidemia Diabetes mellitus, type II  Past Surgical History: Reviewed history from 08/25/2007 and no changes required. Percutaneous transluminal coronary angioplasty  Family History: Reviewed history from 04/01/2009 and no changes required. Family History of Colon CA 1st  degree relative <60 heart disease: maternal grandfather, father's side cancer: mother (breast), maternal grandfather (colon)   Social History: Reviewed history from 12/31/2008 and no changes required. Current Smoker. 1 ppd.  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   Alcohol use-no Drug use-no Regular exercise-no Hepatitis Risk:  no risk noted STD Risk:  no risk noted Sexual History:  currently monogamous  Review of Systems  The patient denies anorexia, fever, weight gain, chest pain, prolonged cough, headaches, hemoptysis, abdominal pain, hematuria, enlarged lymph nodes, and testicular masses.    Physical Exam  General:  alert, well-developed, well-nourished, well-hydrated, normal appearance, healthy-appearing, cooperative to examination, good hygiene, and overweight-appearing.   Head:  normocephalic and atraumatic.   Mouth:  Oral mucosa and oropharynx without lesions or exudates.  Teeth in good repair. Lungs:  normal respiratory effort, no intercostal retractions, no accessory muscle use, and no dullness.   Heart:  normal rate, regular rhythm, no murmur, and no rub.   Abdomen:  in his pubic area in the midline there is a 2 cm area of erythema, fluctuance, ttp, and some induration. It does not extend onto the penis or scrotum and there is no LAD or rash. Pulses:  R and L carotid,radial,femoral,dorsalis pedis and posterior tibial  pulses are full and equal bilaterally Extremities:  No clubbing, cyanosis, edema, or deformity noted with normal full range of motion of all joints.   Skin:  turgor normal, color normal, no rashes, no suspicious lesions, no ecchymoses, no petechiae, no purpura, no ulcerations, and no edema.   Cervical Nodes:  no anterior cervical adenopathy and no posterior cervical adenopathy.   Inguinal Nodes:  no R inguinal adenopathy and no L inguinal adenopathy.   Psych:  Cognition and judgment appear intact. Alert and  cooperative with normal attention span and concentration. No apparent delusions, illusions, hallucinations Additional Exam:  the pubic area was prepped and draped in sterile fashion and local anesthesia was obtained with 2% plain lidocaine. a 3mm punch  was used to open the abscess and a scant amount of pus was expressed from a cavity with a loculated area. it was irrigated with H2O2 and packed with iodoform. there was no blood loss and he tolerated it well. a dressing was applied. cultue was sent.   Impression & Recommendations:  Problem # 1:  CELLULITIS AND ABSCESS OF UNSPECIFIED SITE (ICD-682.9) Assessment New  The following medications were removed from the medication list:    Doxycycline Monohydrate 100 Mg Caps (Doxycycline monohydrate) ..... One by mouth two times a day for 14 days His updated medication list for this problem includes:    Sulfamethoxazole-tmp Ds 800-160 Mg Tab (Trimethoprim-sulfamethoxazole) .Marland Kitchen... Take 2  tablet by mouth morning and night  Orders: T-Culture, Wound ([id number]) I&D Abscess, Complex (10061)  Complete Medication List: 1)  Ambien Cr 12.5 Mg Cr-tabs (Zolpidem tartrate) .... Onepo at bedtime as needed for insomnia 2)  Viagra 50 Mg Tabs (Sildenafil citrate) .... Take as directed 3)  Lipitor 20 Mg Tabs (Atorvastatin calcium) .... Take 1 tablet by mouth once a day 4)  Sulfamethoxazole-tmp Ds 800-160 Mg Tab (Trimethoprim-sulfamethoxazole) .... Take 2  tablet by mouth morning and night  Other Orders: Tdap => 11yrs IM (09811) Admin 1st Vaccine (91478)  Patient Instructions: 1)  Take your antibiotic as prescribed until ALL of it is gone, but stop if you develop a rash or swelling and contact our office as soon as possible. 2)  Please schedule a follow-up appointment in 2 days for packing removal and recheck. Prescriptions: SULFAMETHOXAZOLE-TMP DS 800-160 MG TAB (TRIMETHOPRIM-SULFAMETHOXAZOLE) Take 2  tablet by mouth morning and night  #40 x 0    Entered and Authorized by:   Etta Grandchild MD   Signed by:   Etta Grandchild MD on 12/09/2009   Method used:   Electronically to        [facility] (retail)       [address]       [place], [place]  [address]       Ph: [phone]       Fax: [phone]   RxID:   [id number]    Immunizations Administered:  Tetanus Vaccine:    Vaccine Type: Tdap    Site: left deltoid    Mfr: GlaxoSmithKline    Dose: 0.5 ml    Route: IM    Given by: Rock Nephew CMA    Exp. Date: 01/22/2011    Lot #: [id number]    VIS given: 10/16/07 version given December 09, 2009.

## 2010-12-30 NOTE — Assessment & Plan Note (Signed)
Summary: cpx w/labs //LA   Vital Signs:  Patient profile:   50 year old male Height:      74 inches Weight:      245 pounds BMI:     31.57 O2 Sat:      98 % on Room air Temp:     98.4 degrees F oral Pulse rate:   78 / minute Pulse rhythm:   regular Resp:     16 per minute BP sitting:   140 / 72  (left arm) Cuff size:   large  Vitals Entered By: Rock Nephew CMA (December 11, 2009 8:15 AM)  Nutrition Counseling: Patient's BMI is greater than 25 and therefore counseled on weight management options.  O2 Flow:  Room air CC: CPX w/labs, Preventive Care Is Patient Diabetic? No Pain Assessment Patient in pain? no        Primary Care Provider:  Etta Grandchild MD  CC:  CPX w/labs and Preventive Care.  History of Present Illness: He returns for packing removal and says the abscess area is healing well with no pain, swelling, bleeding.  He wants to do a complete physical and to be screened for STD's today.  He is still having some ED.  Preventive Screening-Counseling & Management  Alcohol-Tobacco     Alcohol drinks/day: <1     Smoking Status: current     Smoking Cessation Counseling: yes     Smoke Cessation Stage: precontemplative     Packs/Day: 1.0     Year Started: 1979     Cans of tobacco/week: no     Passive Smoke Exposure: no  Hep-HIV-STD-Contraception     Hepatitis Risk: no risk noted     HIV Risk: no     STD Risk: no risk noted      Sexual History:  currently monogamous.        Drug Use:  never.        Blood Transfusions:  no.    Clinical Review Panels:  Prevention   Last PSA:  2.07 (07/31/2008)  Lipid Management   Cholesterol:  193 (12/18/2008)   LDL (bad choesterol):  132 (12/18/2008)   HDL (good cholesterol):  35.9 (12/18/2008)  Diabetes Management   HgBA1C:  6.4 (12/18/2008)   Creatinine:  1.1 (12/18/2008)   Last Foot Exam:  yes (12/18/2008)  CBC   WBC:  5.1 (12/18/2008)   RBC:  5.11 (12/18/2008)   Hgb:  14.7 (12/18/2008)   Hct:   43.0 (12/18/2008)   Platelets:  243 (12/18/2008)   MCV  84.0 (12/18/2008)   MCHC  34.3 (12/18/2008)   RDW  13.2 (12/18/2008)   PMN:  54.2 (12/18/2008)   Lymphs:  28.8 (12/18/2008)   Monos:  12.2 (12/18/2008)   Eosinophils:  3.5 (12/18/2008)   Basophil:  1.3 (12/18/2008)  Complete Metabolic Panel   Glucose:  97 (12/18/2008)   Sodium:  148 (12/18/2008)   Potassium:  4.5 (12/18/2008)   Chloride:  109 (12/18/2008)   CO2:  31 (12/18/2008)   BUN:  9 (12/18/2008)   Creatinine:  1.1 (12/18/2008)   Albumin:  4.0 (12/18/2008)   Total Protein:  7.4 (12/18/2008)   Calcium:  9.4 (12/18/2008)   Total Bili:  0.8 (12/18/2008)   Alk Phos:  94 (12/18/2008)   SGPT (ALT):  25 (12/18/2008)   SGOT (AST):  22 (12/18/2008)   Current Medications (verified): 1)  Ambien Cr 12.5 Mg Cr-Tabs (Zolpidem Tartrate) .... Onepo At Bedtime As Needed For Insomnia 2)  Viagra 50  Mg Tabs (Sildenafil Citrate) .... Take As Directed 3)  Lipitor 20 Mg Tabs (Atorvastatin Calcium) .... Take 1 Tablet By Mouth Once A Day 4)  Sulfamethoxazole-Tmp Ds 800-160 Mg Tab (Trimethoprim-Sulfamethoxazole) .... Take 2  Tablet By Mouth Morning and Night  Allergies (verified): No Known Drug Allergies  Past History:  Past Medical History: Reviewed history from 12/18/2008 and no changes required. Depression Tobacco Abuse Coronary artery disease Hyperlipidemia Diabetes mellitus, type II  Past Surgical History: Reviewed history from 08/25/2007 and no changes required. Percutaneous transluminal coronary angioplasty  Family History: Reviewed history from 04/01/2009 and no changes required. Family History of Colon CA 1st degree relative <60 heart disease: maternal grandfather, father's side cancer: mother (breast), maternal grandfather (colon)   Social History: Reviewed history from 12/31/2008 and no changes required. Current Smoker. 1 ppd.  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     Alcohol use-no Drug use-no Regular exercise-no Drug Use:  never Blood Transfusions:  no  Review of Systems  The patient denies anorexia, fever, weight loss, weight gain, chest pain, syncope, dyspnea on exertion, peripheral edema, prolonged cough, headaches, hemoptysis, abdominal pain, melena, hematochezia, severe indigestion/heartburn, hematuria, genital sores, suspicious skin lesions, difficulty walking, enlarged lymph nodes, angioedema, and testicular masses.   General:  Denies chills, fatigue, fever, loss of appetite, malaise, sweats, and weakness. GU:  Complains of decreased libido and erectile dysfunction; denies discharge, dysuria, hematuria, incontinence, nocturia, urinary frequency, and urinary hesitancy. Endo:  Complains of polyuria; denies cold intolerance, excessive hunger, excessive thirst, excessive urination, and weight change.  Physical Exam  General:  alert, well-developed, well-nourished, well-hydrated, normal appearance, healthy-appearing, cooperative to examination, good hygiene, and overweight-appearing.   Head:  normocephalic and atraumatic.   Mouth:  Oral mucosa and oropharynx without lesions or exudates.  Teeth in good repair. Neck:  supple, full ROM, no masses, no thyromegaly, no thyroid nodules or tenderness, no JVD, normal carotid upstroke, no carotid bruits, no cervical lymphadenopathy, and no neck tenderness.   Chest Wall:  no deformities, no tenderness, and no masses.   Breasts:  No masses or gynecomastia noted Lungs:  normal respiratory effort, no intercostal retractions, no accessory muscle use, normal breath sounds, no dullness, no fremitus, no crackles, and no wheezes.   Heart:  normal rate, regular rhythm, no murmur, no gallop, no rub, and no JVD.   Abdomen:  soft, non-tender, normal bowel sounds, no distention, no masses, no guarding, no rigidity, no rebound tenderness, no hepatomegaly, and no splenomegaly.   Rectal:  No external abnormalities noted.  Normal sphincter tone. No rectal masses or tenderness. heme negative. Genitalia:  circumcised, no hydrocele, no varicocele, no scrotal masses, no testicular masses or atrophy, no cutaneous lesions, and no urethral discharge.  packing is removed from pubic area and there is no more swelling, exudate, fluctuance, induration Prostate:  no nodules, no asymmetry, no induration, and 1+ enlarged.   Msk:  normal ROM, no joint tenderness, no joint swelling, no joint warmth, no redness over joints, no joint deformities, no joint instability, and no crepitation.   Pulses:  R and L carotid,radial,femoral,dorsalis pedis and posterior tibial pulses are full and equal bilaterally Extremities:  No clubbing, cyanosis, edema, or deformity noted with normal full range of motion of all joints.   Neurologic:  alert & oriented X3, cranial nerves II-XII intact, strength normal in all extremities, sensation intact to light touch, gait normal, and DTRs symmetrical and normal.   Skin:  turgor normal, color normal,  no rashes, no suspicious lesions, no ecchymoses, no petechiae, no purpura, no ulcerations, and no edema.  tattoo(s).   Cervical Nodes:  no anterior cervical adenopathy and no posterior cervical adenopathy.   Axillary Nodes:  no R axillary adenopathy and no L axillary adenopathy.   Inguinal Nodes:  no R inguinal adenopathy and no L inguinal adenopathy.   Psych:  Cognition and judgment appear intact. Alert and cooperative with normal attention span and concentration. No apparent delusions, illusions, hallucinations Additional Exam:  EKG shows NSR with one PVC but no Q waves and normal ST/T waves   Impression & Recommendations:  Problem # 1:  CELLULITIS AND ABSCESS OF UNSPECIFIED SITE (ICD-682.9) Assessment Improved  His updated medication list for this problem includes:    Sulfamethoxazole-tmp Ds 800-160 Mg Tab (Trimethoprim-sulfamethoxazole) .Marland Kitchen... Take 2  tablet by mouth morning and night  Problem # 2:   ROUTINE GENERAL MEDICAL EXAM@HEALTH  CARE FACL (ICD-V70.0)  Orders: Venipuncture (56213) TLB-Lipid Panel (80061-LIPID) TLB-BMP (Basic Metabolic Panel-BMET) (80048-METABOL) TLB-CBC Platelet - w/Differential (85025-CBCD) TLB-Hepatic/Liver Function Pnl (80076-HEPATIC) TLB-TSH (Thyroid Stimulating Hormone) (84443-TSH) TLB-FSH (Follicle Stimulating Hormone) (83001-FSH) TLB-Luteinizing Hormone (LH) (83002-LH) TLB-Prolactin (84146-PROL) TLB-PSA (Prostate Specific Antigen) (84153-PSA) TLB-Udip w/ Micro (81001-URINE) TLB-Testosterone, Total (84403-TESTO) TLB-A1C / Hgb A1C (Glycohemoglobin) (83036-A1C)  Td Booster: Tdap (12/09/2009)   Chol: 193 (12/18/2008)   HDL: 35.9 (12/18/2008)   LDL: 132 (12/18/2008)   TG: 128 (12/18/2008) TSH: 0.88 (12/18/2008)   HgbA1C: 6.4 (12/18/2008)   PSA: 2.07 (07/31/2008)  Discussed using sunscreen, use of alcohol, drug use, self testicular exam, routine dental care, routine eye care, routine physical exam, seat belts, multiple vitamins, and recommendations for immunizations.  Discussed exercise and checking cholesterol.  Also recommend checking PSA.  Problem # 3:  HYPERLIPIDEMIA (ICD-272.4) Assessment: Unchanged  His updated medication list for this problem includes:    Lipitor 20 Mg Tabs (Atorvastatin calcium) .Marland Kitchen... Take 1 tablet by mouth once a day  Orders: Venipuncture (08657) TLB-Lipid Panel (80061-LIPID) TLB-BMP (Basic Metabolic Panel-BMET) (80048-METABOL) TLB-CBC Platelet - w/Differential (85025-CBCD) TLB-Hepatic/Liver Function Pnl (80076-HEPATIC) TLB-TSH (Thyroid Stimulating Hormone) (84443-TSH) TLB-FSH (Follicle Stimulating Hormone) (83001-FSH) TLB-Luteinizing Hormone (LH) (83002-LH) TLB-Prolactin (84146-PROL) TLB-PSA (Prostate Specific Antigen) (84153-PSA) TLB-Udip w/ Micro (81001-URINE) TLB-Testosterone, Total (84403-TESTO) TLB-A1C / Hgb A1C (Glycohemoglobin) (83036-A1C)  Labs Reviewed: SGOT: 22 (12/18/2008)   SGPT: 25 (12/18/2008)  Lipid  Goals: Chol Goal: 200 (12/18/2008)   HDL Goal: 40 (12/18/2008)   LDL Goal: 70 (12/18/2008)   TG Goal: 150 (12/18/2008)  Prior 10 Yr Risk Heart Disease: N/A (12/18/2008)   HDL:35.9 (12/18/2008), 34.4 (08/18/2008)  LDL:132 (12/18/2008), 74 (84/69/6295)  Chol:193 (12/18/2008), 132 (08/18/2008)  Trig:128 (12/18/2008), 119 (08/18/2008)  Problem # 4:  HYPERTROPHY PROSTATE W/UR OBST & OTH LUTS (ICD-600.01) Assessment: Unchanged  Orders: Venipuncture (28413) TLB-Lipid Panel (80061-LIPID) TLB-BMP (Basic Metabolic Panel-BMET) (80048-METABOL) TLB-CBC Platelet - w/Differential (85025-CBCD) TLB-Hepatic/Liver Function Pnl (80076-HEPATIC) TLB-TSH (Thyroid Stimulating Hormone) (84443-TSH) TLB-FSH (Follicle Stimulating Hormone) (83001-FSH) TLB-Luteinizing Hormone (LH) (83002-LH) TLB-Prolactin (84146-PROL) TLB-PSA (Prostate Specific Antigen) (84153-PSA) TLB-Udip w/ Micro (81001-URINE) TLB-Testosterone, Total (84403-TESTO) TLB-A1C / Hgb A1C (Glycohemoglobin) (83036-A1C)  Problem # 5:  DIABETES MELLITUS, TYPE II (ICD-250.00) Assessment: Unchanged  Orders: Venipuncture (24401) TLB-Lipid Panel (80061-LIPID) TLB-BMP (Basic Metabolic Panel-BMET) (80048-METABOL) TLB-CBC Platelet - w/Differential (85025-CBCD) TLB-Hepatic/Liver Function Pnl (80076-HEPATIC) TLB-TSH (Thyroid Stimulating Hormone) (84443-TSH) TLB-FSH (Follicle Stimulating Hormone) (83001-FSH) TLB-Luteinizing Hormone (LH) (83002-LH) TLB-Prolactin (84146-PROL) TLB-PSA (Prostate Specific Antigen) (84153-PSA) TLB-Udip w/ Micro (81001-URINE) TLB-Testosterone, Total (84403-TESTO) TLB-A1C / Hgb A1C (Glycohemoglobin) (83036-A1C) Tobacco use cessation intermediate 3-10 minutes (99406)  Labs Reviewed: Creat: 1.1 (  12/18/2008)    Reviewed HgBA1c results: 6.4 (12/18/2008)  6.1 (07/31/2008)  Problem # 6:  ERECTILE DYSFUNCTION (ICD-302.72) Assessment: Deteriorated I advised him that the factors causing this are tobacco abuse, DM, high  cholesterol, and obesity. The following medications were removed from the medication list:    Viagra 50 Mg Tabs (Sildenafil citrate) .Marland Kitchen... Take as directed His updated medication list for this problem includes:    Cialis 20 Mg Tabs (Tadalafil) ..... One by mouth q 3-4 days as directed  Orders: Venipuncture (54098) TLB-Lipid Panel (80061-LIPID) TLB-BMP (Basic Metabolic Panel-BMET) (80048-METABOL) TLB-CBC Platelet - w/Differential (85025-CBCD) TLB-Hepatic/Liver Function Pnl (80076-HEPATIC) TLB-TSH (Thyroid Stimulating Hormone) (84443-TSH) TLB-FSH (Follicle Stimulating Hormone) (83001-FSH) TLB-Luteinizing Hormone (LH) (83002-LH) TLB-Prolactin (84146-PROL) TLB-PSA (Prostate Specific Antigen) (84153-PSA) TLB-Udip w/ Micro (81001-URINE) TLB-Testosterone, Total (84403-TESTO) TLB-A1C / Hgb A1C (Glycohemoglobin) (83036-A1C) Tobacco use cessation intermediate 3-10 minutes (11914)  Problem # 7:  SEXUAL ACTIVITY, HIGH RISK (ICD-V69.2) Assessment: New  Orders: T-GC Probe, urine (78295-62130) T-Chlamydia  Probe, urine [phone]) T-RPR (Syphilis) (95284-13244) T-HIV Antibody  (Reflex) (01027-25366)  Complete Medication List: 1)  Ambien Cr 12.5 Mg Cr-tabs (Zolpidem tartrate) .... Onepo at bedtime as needed for insomnia 2)  Lipitor 20 Mg Tabs (Atorvastatin calcium) .... Take 1 tablet by mouth once a day 3)  Sulfamethoxazole-tmp Ds 800-160 Mg Tab (Trimethoprim-sulfamethoxazole) .... Take 2  tablet by mouth morning and night 4)  Cialis 20 Mg Tabs (Tadalafil) .... One by mouth q 3-4 days as directed  Other Orders: Cardiology Referral (Cardiology) EKG w/ Interpretation (93000)  Colorectal Screening:  Current Recommendations:    Hemoccult: NEG X 1 today  PSA Screening:    PSA: 2.07  (07/31/2008)    Reviewed PSA screening recommendations: PSA ordered  Immunization & Chemoprophylaxis:    Tetanus vaccine: Tdap  (12/09/2009)  Patient Instructions: 1)  Tobacco is very bad for your  health and your loved ones! You Should stop smoking!. 2)  Stop Smoking Tips: Choose a Quit date. Cut down before the Quit date. decide what you will do as a substitute when you feel the urge to smoke(gum,toothpick,exercise). 3)  It is important that you exercise regularly at least 20 minutes 5 times a week. If you develop chest pain, have severe difficulty breathing, or feel very tired , stop exercising immediately and seek medical attention. 4)  You need to lose weight. Consider a lower calorie diet and regular exercise.  5)  Please schedule a follow-up appointment in 1 month. Prescriptions: CIALIS 20 MG TABS (TADALAFIL) One by mouth q 3-4 days as directed  #15 x 0   Entered and Authorized by:   Etta Grandchild MD   Signed by:   Etta Grandchild MD on 12/11/2009   Method used:   Samples Given   RxID:   [id number]     Appended Document: Orders Update    Clinical Lists Changes

## 2010-12-30 NOTE — Letter (Signed)
Summary: Lipid Letter  [facility]  [address] [place], [place] [address]   Phone: [phone]  Fax: [phone]    12/13/2009  Rayetta Pigg [address] [place], [place]  [address]  Dear Gery Pray:  We have carefully reviewed your last lipid profile from 12/18/2008 and the results are noted below with a summary of recommendations for lipid management.    Cholesterol:       193     Goal: <200   HDL "good" Cholesterol:   63.0     Goal: >40   LDL "bad" Cholesterol:   132     Goal: <70   Triglycerides:       128     Goal: <150    these number should be better    TLC Diet (Therapeutic Lifestyle Change): Saturated Fats & Transfatty acids should be kept < 7% of total calories ***Reduce Saturated Fats Polyunstaurated Fat can be up to 10% of total calories Monounsaturated Fat Fat can be up to 20% of total calories Total Fat should be no greater than 25-35% of total calories Carbohydrates should be 50-60% of total calories Protein should be approximately 15% of total calories Fiber should be at least 20-30 grams a day ***Increased fiber may help lower LDL Total Cholesterol should be < 200mg /day Consider adding plant stanol/sterols to diet (example: Benacol spread) ***A higher intake of unsaturated fat may reduce Triglycerides and Increase HDL    Adjunctive Measures (may lower LIPIDS and reduce risk of Heart Attack) include: Aerobic Exercise (20-30 minutes 3-4 times a week) Limit Alcohol Consumption Weight Reduction Aspirin 75-81 mg a day by mouth (if not allergic or contraindicated) Dietary Fiber 20-30 grams a day by mouth     Current Medications: 1)    Ambien Cr 12.5 Mg Cr-tabs (Zolpidem tartrate) .... Onepo at bedtime as needed for insomnia 2)    Lipitor 20 Mg Tabs (Atorvastatin calcium) .... Take 1 tablet by mouth once a day 3)    Sulfamethoxazole-tmp Ds 800-160 Mg Tab (Trimethoprim-sulfamethoxazole) .... Take 2  tablet by mouth morning and night 4)     Cialis 20 Mg Tabs (Tadalafil) .... One by mouth q 3-4 days as directed  If you have any questions, please call. We appreciate being able to work with you.   Sincerely,    [facility] Etta Grandchild MD

## 2010-12-30 NOTE — Letter (Signed)
Summary: Disability form/Patient  Disability form/Patient   Imported By: Sherian Rein 07/15/2010 14:30:32  _____________________________________________________________________  External Attachment:    Type:   Image     Comment:   External Document

## 2010-12-30 NOTE — Letter (Signed)
Summary: Lipid Letter  [facility]  [address] [place], [place] [address]   Phone: [phone]  Fax: [phone]    12/13/2009  [name] [address] [place], [place]  [address]  Dear [name]:  We have carefully reviewed your last lipid profile from 12/11/2009 and the results are noted below with a summary of recommendations for lipid management.    Cholesterol:       162     Goal: <200   HDL "good" Cholesterol:   39     Goal: >40   LDL "bad" Cholesterol:   93     Goal: <70   Triglycerides:       150     Goal: <150        TLC Diet (Therapeutic Lifestyle Change): Saturated Fats & Transfatty acids should be kept < 7% of total calories ***Reduce Saturated Fats Polyunstaurated Fat can be up to 10% of total calories Monounsaturated Fat Fat can be up to 20% of total calories Total Fat should be no greater than 25-35% of total calories Carbohydrates should be 50-60% of total calories Protein should be approximately 15% of total calories Fiber should be at least 20-30 grams a day ***Increased fiber may help lower LDL Total Cholesterol should be < 200mg /day Consider adding plant stanol/sterols to diet (example: Benacol spread) ***A higher intake of unsaturated fat may reduce Triglycerides and Increase HDL    Adjunctive Measures (may lower LIPIDS and reduce risk of Heart Attack) include: Aerobic Exercise (20-30 minutes 3-4 times a week) Limit Alcohol Consumption Weight Reduction Aspirin 75-81 mg a day by mouth (if not allergic or contraindicated) Dietary Fiber 20-30 grams a day by mouth     Current Medications: 1)    Ambien Cr 12.5 Mg Cr-tabs (Zolpidem tartrate) .... Onepo at bedtime as needed for insomnia 2)    Lipitor 20 Mg Tabs (Atorvastatin calcium) .... Take 1 tablet by mouth once a day 3)    Sulfamethoxazole-tmp Ds 800-160 Mg Tab (Trimethoprim-sulfamethoxazole) .... Take 2  tablet by mouth morning and night 4)    Cialis 20 Mg Tabs (Tadalafil)  .... One by mouth q 3-4 days as directed  If you have any questions, please call. We appreciate being able to work with you.   Sincerely,     [facility] Etta Grandchild MD

## 2010-12-30 NOTE — Letter (Signed)
Summary: Out of Work  [facility]  [address] [place], [place] [address]   Phone: [phone]  Fax: [phone]    December 09, 2009   Employee:  [name]    To Whom It May Concern:   For Medical reasons, please excuse the above named employee from work for the following dates:  Start:   12/09/2009  End:   12/12/2009  If you need additional information, please feel free to contact our office.         Sincerely,    Alvy Beal CMA fpr Sanda Linger, MD

## 2010-12-30 NOTE — Progress Notes (Signed)
----   Converted from flag ---- ---- 07/05/2010 2:43 PM, Edman Circle wrote: appt 9/2 @ 10:15 with Eden Emms  ---- 07/05/2010 2:22 PM, Dagoberto Reef wrote: Thanks  ---- 07/05/2010 2:15 PM, Etta Grandchild MD wrote: The following orders have been entered for this patient and placed on Admin Hold:  Type:     Referral       Code:   Cardiology Description:   Cardiology Referral Order Date:   07/05/2010   Authorized By:   Etta Grandchild MD Order #:   [id number] Clinical Notes: ------------------------------

## 2010-12-30 NOTE — Progress Notes (Signed)
Summary: Nuc Pre-Procedure  Phone Note Outgoing Call Call back at [facility] Phone [phone]   Call placed by: Antionette Char RN,  July 08, 2010 3:56 PM Call placed to: Patient Reason for Call: Confirm/change Appt Summary of Call: Reviewed information on Myoview Information Sheet (see scanned document for further details).  Spoke with patient.    New Allergies: ! PENICILLIN New Allergies: ! PENICILLIN

## 2010-12-30 NOTE — Progress Notes (Signed)
----   Converted from flag ---- ---- 12/11/2009 11:00 AM, Edman Circle wrote: appt 2/2 @ 9:45  ---- 12/11/2009 8:39 AM, Dagoberto Reef wrote: Thanks  ---- 12/11/2009 8:33 AM, Etta Grandchild MD wrote: The following orders have been entered for this patient and placed on Admin Hold:  Type:     Referral       Code:   Cardiology Description:   Cardiology Referral Order Date:   12/11/2009   Authorized By:   Etta Grandchild MD Order #:   [id number] Clinical Notes: ------------------------------

## 2010-12-30 NOTE — Assessment & Plan Note (Signed)
Summary: Cardiology Nuclear Testing  Nuclear Med Background Indications for Stress Test: Evaluation for Ischemia  Indications Comments: [facility] 07/02/10 for CP- neg. enzymes  History: Heart Catheterization, Myocardial Perfusion Study  History Comments: 2006 MPS EF 51% 2006 Heart Cath- Mild Disease EF 60%  Symptoms: Chest Pain, Chest Tightness, Chest Tightness with Exertion, Diaphoresis, Dizziness, DOE, Fatigue, Fatigue with Exertion, Light-Headedness, Palpitations, SOB    Nuclear Pre-Procedure Cardiac Risk Factors: Family History - CAD, IDDM Type 2, Lipids, Smoker Caffeine/Decaff Intake: None NPO After: 9:00 AM Lungs: Clear IV 0.9% NS with Angio Cath: 22g     IV Site: (R) Hand IV Started by: Edwyna Perfect, RN Chest Size (in) 42     Height (in): 74 Weight (lb): 239 BMI: 30.80  Nuclear Med Study 1 or 2 day study:  1 day     Stress Test Type:  Stress Reading MD:  Willa Rough, MD     Referring MD:  Charlton Haws Resting Radionuclide:  Technetium 48m Tetrofosmin     Resting Radionuclide Dose:  11.0 mCi  Stress Radionuclide:  Technetium 63m Tetrofosmin     Stress Radionuclide Dose:  33.0 mCi   Stress Protocol Exercise Time (min):  11:31 min     Max HR:  150 bpm     Predicted Max HR:  173 bpm  Max Systolic BP: 132 mm Hg     Percent Max HR:  86.71 %     METS: 13.4 Rate Pressure Product:  81191    Stress Test Technologist:  Irean Hong RN     Nuclear Technologist:  Domenic Polite CNMT  Rest Procedure  Myocardial perfusion imaging was performed at rest 45 minutes following the intravenous administration of Myoview Technetium 36m Tetrofosmin.  Stress Procedure  The patient exercised for 11 minutes and 31 seconds, RPE=15.  The patient stopped due to DOE  and denied any chest pain.  There were no significant ST-T wave changes.   Myoview was injected at peak exercise and myocardial perfusion imaging was performed after a brief delay.  QPS Raw Data Images:  Patient motion  noted; appropriate software correction applied. Stress Images:  Mild decrease in activity at the base of the inferior wall. Rest Images:  Same as stress Subtraction (SDS):  No evidence of ischemia. Transient Ischemic Dilatation:  0.88  (Normal <1.22)  Lung/Heart Ratio:  0.31  (Normal <0.45)  Quantitative Gated Spect Images QGS EDV:  148 ml QGS ESV:  70 ml QGS EF:  53 % QGS cine images:  EF is low normal.  Findings Low risk nuclear study      Overall Impression  Exercise Capacity: Good exercise capacity. BP Response: Normal blood pressure response. Clinical Symptoms: No chest pain ECG Impression: No significant ST segment change suggestive of ischemia. Overall Impression Comments: The study is similar to the report of 2006. There is decreased activity at the base of the inferior wall. This could be small scar or attenuation. I doubt any change from the past.  Appended Document: Cardiology Nuclear Testing COPY SENT TO DR.JONES

## 2010-12-30 NOTE — Progress Notes (Signed)
  Phone Note Call from Patient   Summary of Call: Robyn Nohr (2061/02/02) Please call this pt, he had stress test, needs results - something to do w/work.Marland KitchenMarland KitchenI am not sure what is going on w/him maybe you do. [phone]. Thanks!! ;)  Elnita Maxwell  Follow-up for Phone Call        Patient is requesting stauts of results so that he may return to work. Please advise......Marland KitchenMarland KitchenAlvy Beal Archie CMA  July 13, 2010 1:04 PM   Additional Follow-up for Phone Call Additional follow up Details #1::        no results available-call cardiology Additional Follow-up by: Etta Grandchild MD,  July 13, 2010 7:00 PM

## 2010-12-30 NOTE — Letter (Signed)
Summary: Results Follow-up Letter  [facility]  [address] [place], [place] [address]   Phone: [phone]  Fax: [phone]    12/13/2009  [address] [place], [place]  [address]  Dear [name],   The following are the results of your recent test(s):  Test     Result     STD's       all negative Urine       normal Testosterone   low Prostate     normal Blood sugars   acceptable CBC       normal Liver/kidney   normal    _________________________________________________________  Please call for an appointment Or _________________________________________________________ _________________________________________________________ _________________________________________________________  Sincerely,  Sanda Linger MD  [facility]

## 2010-12-30 NOTE — Letter (Signed)
Summary: Out of Work  [facility]  [address] [place], [place] [address]   Phone: [phone]  Fax: [phone]    July 05, 2010   Employee:  SHIVA SAHAGIAN    To Whom It May Concern:   For Medical reasons, please excuse the above named employee from work for the following dates:  Start:   06/26/10  End:   07/05/10  If you need additional information, please feel free to contact our office.         Sincerely,    Etta Grandchild MD

## 2010-12-30 NOTE — Progress Notes (Signed)
Summary: RESULTS  Phone Note Call from Patient Call back at Home Phone [phone]   Summary of Call: Patient is requesting results of stress test. See EMR dated 8/11, results are ready. Please advise, can patient return to work?   Initial call taken by: Lamar Sprinkles, CMA,  July 14, 2010 9:54 AM  Follow-up for Phone Call        low risk - yes he can return to work Follow-up by: Etta Grandchild MD,  July 14, 2010 11:36 AM  Additional Follow-up for Phone Call Additional follow up Details #1::        Patient notified and will come by to pick up letter.[name] CMA  July 14, 2010 1:34 PM

## 2011-02-11 LAB — COMPREHENSIVE METABOLIC PANEL
ALT: 22 U/L (ref 0–53)
AST: 16 U/L (ref 0–37)
Albumin: 4 g/dL (ref 3.5–5.2)
Alkaline Phosphatase: 91 U/L (ref 39–117)
CO2: 29 mEq/L (ref 19–32)
Chloride: 104 mEq/L (ref 96–112)
Creatinine, Ser: 1.18 mg/dL (ref 0.4–1.5)
GFR calc Af Amer: >60 mL/min (ref >60)
GFR calc non Af Amer: >60 mL/min (ref >60)
Potassium: 4.1 mEq/L (ref 3.5–5.1)
Total Bilirubin: 0.4 mg/dL (ref 0.3–1.2)

## 2011-02-11 LAB — CARDIAC PANEL(CRET KIN+CKTOT+MB+TROPI)
CK, MB: 0.7 ng/mL (ref 0.3–4.0)
Relative Index: 0.7 (ref 0.0–2.5)

## 2011-02-11 LAB — CK TOTAL AND CKMB (NOT AT ARMC)
CK, MB: 0.9 ng/mL (ref 0.3–4.0)
Total CK: 135 U/L (ref 7–232)

## 2011-02-11 LAB — CBC
HCT: 42.1 % (ref 39.0–52.0)
MCV: 83 fL (ref 78.0–100.0)
RBC: 5.08 MIL/uL (ref 4.22–5.81)
WBC: 4.7 10*3/uL (ref 4.0–10.5)

## 2011-02-11 LAB — DIFFERENTIAL
Eosinophils Relative: 4 % (ref 0–5)
Lymphocytes Relative: 30 % (ref 12–46)
Lymphs Abs: 1.4 10*3/uL (ref 0.7–4.0)
Monocytes Absolute: 0.5 10*3/uL (ref 0.1–1.0)

## 2011-02-11 LAB — POCT I-STAT, CHEM 8
BUN: 7 mg/dL (ref 6–23)
Calcium, Ion: 1.09 mmol/L — ABNORMAL LOW (ref 1.12–1.32)
Chloride: 105 mEq/L (ref 96–112)
Creatinine, Ser: 1.3 mg/dL (ref 0.4–1.5)
Sodium: 143 mEq/L (ref 135–145)
TCO2: 25 mmol/L (ref 0–100)

## 2011-02-11 LAB — LIPID PANEL
HDL: 37 mg/dL — ABNORMAL LOW (ref >39)
LDL Cholesterol: 95 mg/dL (ref 0–99)
Total CHOL/HDL Ratio: 4.6 RATIO
Triglycerides: 190 mg/dL — ABNORMAL HIGH (ref <150)
VLDL: 38 mg/dL (ref 0–40)

## 2011-02-11 LAB — TROPONIN I
Troponin I: 0.01 ng/mL (ref 0.00–0.06)
Troponin I: <0.01 ng/mL (ref 0.00–0.06)

## 2011-02-11 LAB — BASIC METABOLIC PANEL
Chloride: 108 mEq/L (ref 96–112)
GFR calc Af Amer: >60 mL/min (ref >60)
Potassium: 3.4 mEq/L — ABNORMAL LOW (ref 3.5–5.1)
Sodium: 141 mEq/L (ref 135–145)

## 2011-02-11 LAB — PHOSPHORUS: Phosphorus: 4.3 mg/dL (ref 2.3–4.6)

## 2011-02-11 LAB — POCT CARDIAC MARKERS
Myoglobin, poc: 75.7 ng/mL (ref 12–200)
Troponin i, poc: <0.05 ng/mL (ref 0.00–0.09)

## 2011-02-11 LAB — TSH: TSH: 1.454 u[IU]/mL (ref 0.350–4.500)

## 2011-02-11 LAB — HEMOGLOBIN A1C
Hgb A1c MFr Bld: 5.9 % — ABNORMAL HIGH (ref <5.7)
Mean Plasma Glucose: 123 mg/dL — ABNORMAL HIGH (ref <117)

## 2011-03-25 ENCOUNTER — Ambulatory Visit: Payer: Self-pay | Admitting: Internal Medicine

## 2011-04-15 NOTE — Procedures (Signed)
[name], [name] NO.:  [id number]   MEDICAL RECORD NO.:  [id number]          PATIENT TYPE:  OUT   LOCATION:  SLEEP CENTER                 FACILITY:  [facility]   PHYSICIAN:  Barbaraann Share, MD,FCCPDATE OF BIRTH:  03/11/1961   DATE OF STUDY:                            NOCTURNAL POLYSOMNOGRAM   REFERRING PHYSICIAN:  Barbaraann Share, MD,FCCP   LOCATION:  Sleep lab.   REFERRING PHYSICIAN:  Barbaraann Share, MD, [facility]   DATE OF STUDY:  January 07, 2009.   INDICATION FOR STUDY:  Hypersomnia with sleep apnea.   EPWORTH SLEEPINESS SCORE:  2.   MEDICATIONS:   SLEEP ARCHITECTURE:  The patient had a total sleep time of 283 minutes  with no slow wave sleep and only 30 minutes of REM.  Sleep onset latency  was normal at 6 minutes, and REM onset was normal at 95 minutes.  Sleep  efficiency was moderately reduced at 79%, and the patient did have very  fragmented sleep during the night.   RESPIRATORY DATA:  The patient was found to have 14 obstructive apneas  and 13 obstructive hypopneas for an apnea/hypopnea index of 6 events per  hour.  He was also noted to have 43 respiratory effort-related arousals,  giving him a respiratory disturbance index of 15 events per hour.  The  events occurred in all body positions, and there was loud snoring noted  throughout.   OXYGEN DATA:  There was O2 desaturation as low as 87% with the  obstructive events.   CARDIAC DATA:  The patient was noted to have frequent PVCs throughout  the night.   MOVEMENT-PARASOMNIA:  There were no significant periodic leg movements  or abnormal behavior seen.   IMPRESSIONS-RECOMMENDATIONS:  1. Mild obstructive sleep apnea/hypopnea syndrome with an apnea-      hypopnea index of 6 events per hour, and a respiratory disturbance      index of 15 events per hour.  There was O2 desaturation as low as      87% with his obstructive events.  Treatment for this degree of      sleep apnea can include weight  loss alone if applicable, upper      airway surgery, oral appliance, and also continuous positive airway      pressure.  Clinical correlation is suggested.  2. Frequent premature ventricular contractions noted during the night,      again, clinical correlation is suggested.  It is unknown if this      has been evaluated in the past.      Barbaraann Share, MD,FCCP  Diplomate, American Board of Sleep  Medicine  Electronically Signed     [facility]/MEDQ  D:  01/14/2009 12:40:44  T:  01/15/2009 00:34:00  Job:  [id number]

## 2011-04-15 NOTE — Cardiovascular Report (Signed)
NAME:  [name], [name] NO.:  [id number]   MEDICAL RECORD NO.:  [id number]          PATIENT TYPE:  OIB   LOCATION:  1961                         FACILITY:  [facility]   PHYSICIAN:  Charlies Constable, M.D. 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 He is a smoker  and also has a strong family history of coronary heart disease and mild  hyperlipidemia. He had a Cardiolite scan, which showed possible mild  inferior ischemia and he was scheduled for evaluation with coronary  angiography by Dr. Corinda Gubler.   PROCEDURE:  The procedure was performed via the right femoral artery with an  arterial sheath and 4 French pre-formed coronary catheters. A __________  Omnipaque contrast was used. The patient tolerated the procedure well and  left the laboratory in satisfactory condition.   RESULTS:  LEFT MAIN CORONARY ARTERY:  The left main coronary artery was free  of significant disease.   LEFT ANTERIOR DESCENDING ARTERY:  The left anterior descending artery gave  rise to a large diagonal branch and 3 small diagonal branches and 3 septal  perforators. There was a long area of segmental narrowing in the mid LAD,  estimated at 40% with slight systolic compression.   CIRCUMFLEX ARTERY:  The circumflex artery gave rise to a marginal branch and  2 posterolateral branches. These vessels were free of significant disease.   RIGHT CORONARY ARTERY:  The right coronary artery gave rise to a conus  branch, 2 right ventricular branches, a posterior descending branch, and 2  posterolateral branches. These vessels were free of significant disease.   LEFT VENTRICULOGRAM:  The left ventriculogram performed in the RAO  projection showed good wall motion with no areas of  hypokinesis. The  estimated ejection fraction was 60%.   The aortic pressure was 110/82 with a mean of 95.  Left ventricular pressure was 110/15.   CONCLUSION:  Mild non-obstructive coronary artery disease with 40% narrowing  in the mid left anterior descending, no significant obstruction of the  circumflex and right coronary arteries, and normal left ventricular  function.   RECOMMENDATIONS:  Reassurance. In view of these findings, I think the  patient's pain is not cardiac and is likely musculoskeletal.   Thank you.           ______________________________  Charlies Constable, M.D. [facility]     BB/MEDQ  D:  11/04/2005  T:  11/04/2005  Job:  [id number]   cc:   Cecil Cranker, M.D.  [address]  [place]  [place] [address]   Corwin Levins, M.D. [facility]  [address]  [place]  [place] [address]   Cardiopulmonary Lab

## 2011-04-15 NOTE — Op Note (Signed)
NAMECHRISTIE, [name] NO.:  [id number]   MEDICAL RECORD NO.:  [id number]          PATIENT TYPE:  INP   LOCATION:  0440                         FACILITY:  [facility]   PHYSICIAN:  Anselm Pancoast. Weatherly, M.D.DATE OF BIRTH:  October 09, 1961   DATE OF PROCEDURE:  09/13/2004  DATE OF DISCHARGE:                                 OPERATIVE REPORT   PREOPERATIVE DIAGNOSIS:  Recurrent abscesses, hidradenitis left groin,  perineal body area.  General anesthesia.  Excision of multiple areas of  abscesses left inguinofemoral crease, left inner thigh.  General anesthesia,  lithotomy position.   HISTORY:  [name] is a 50 year old male who has had problems with  recurrent hidradenitis, and on August 20, 2004 he was taken to surgery,  where I excised a large area of frank purulence in the left perineal body  base of the scrotum area, and then there were several smaller areas in the  left inner thigh.  He was seen in the office on two or three occasions.  Cultures originally did not grow MRSA, but I think it was a Staphylococcus,  and he had been on Keflex, and then approximately 10 days ago I placed him  on tetracycline.  Over the past weekend, he has had recurrent pain and  purulence and presented to the emergency room.  They suggested that he be  seen in the office today.  He was seen by Dr. Francina Ames, who called me and  asked that I take him back to the operating room.  The patient has frank  purulence in the area anterior to where he has had this previously drained.  It does not go to the actual inguinal area, but it goes real anterior to the  lateral base of the scrotum.  There are frank areas of purulence.  The  patient's pain is such that it was impossible to do any type of really  thorough examination, and certainly impossible to do anything with local  anesthesia as far as draining this.  He was given a gram of Kefzol,  positioned up on the O.R. table.  First, with the  frank purulence, I  cultured it aerobically and anaerobically.  I then prepped the area with  Betadine solution and then used cautery to kind of open up this large  abscess.  There were numerous areas of kind of edematous skin.  I then used  a curet to scrape out this chronic abscess tissue, and then examined the  areas posterior.  There were areas that looked like it was kind of healing  poorly but healing, and there were a couple areas where he had previously  been I&Ded that had very poor healing.  I re-excised these and debrided them  with cautery.  I then packed everything with Betadine and saline gauze 4 x  4s and held them in place.  Three or four areas required suturing with 3-0  chromic, but most of the bleeders were controlled with cautery.  I really  questioned whether this is a MRSA, even though we have not cultured it  previously,  and I think I will give him vancomycin in addition to the  Kefzol.  I will talk with the pharmacist and await the results of the  cultures.   ESTIMATED BLOOD LOSS:  Probably 50 cc.   The patient will be kept overnight for IV antibiotics.  The patient's white  count is not elevated, and he is not a diabetic.      WJW/MEDQ  D:  09/13/2004  T:  09/14/2004  Job:  [id number]

## 2011-04-21 ENCOUNTER — Encounter: Payer: Self-pay | Admitting: Internal Medicine

## 2011-05-02 ENCOUNTER — Other Ambulatory Visit (INDEPENDENT_AMBULATORY_CARE_PROVIDER_SITE_OTHER): Payer: 59

## 2011-05-02 ENCOUNTER — Encounter: Payer: Self-pay | Admitting: Internal Medicine

## 2011-05-02 ENCOUNTER — Ambulatory Visit (INDEPENDENT_AMBULATORY_CARE_PROVIDER_SITE_OTHER): Payer: 59 | Admitting: Internal Medicine

## 2011-05-02 DIAGNOSIS — F17200 Nicotine dependence, unspecified, uncomplicated: Secondary | ICD-10-CM

## 2011-05-02 DIAGNOSIS — E119 Type 2 diabetes mellitus without complications: Secondary | ICD-10-CM

## 2011-05-02 DIAGNOSIS — Z0000 Encounter for general adult medical examination without abnormal findings: Secondary | ICD-10-CM

## 2011-05-02 DIAGNOSIS — F528 Other sexual dysfunction not due to a substance or known physiological condition: Secondary | ICD-10-CM

## 2011-05-02 DIAGNOSIS — I2510 Atherosclerotic heart disease of native coronary artery without angina pectoris: Secondary | ICD-10-CM

## 2011-05-02 DIAGNOSIS — N401 Enlarged prostate with lower urinary tract symptoms: Secondary | ICD-10-CM

## 2011-05-02 DIAGNOSIS — E7800 Pure hypercholesterolemia, unspecified: Secondary | ICD-10-CM

## 2011-05-02 DIAGNOSIS — Z202 Contact with and (suspected) exposure to infections with a predominantly sexual mode of transmission: Secondary | ICD-10-CM

## 2011-05-02 DIAGNOSIS — L0390 Cellulitis, unspecified: Secondary | ICD-10-CM | POA: Insufficient documentation

## 2011-05-02 DIAGNOSIS — N138 Other obstructive and reflux uropathy: Secondary | ICD-10-CM

## 2011-05-02 DIAGNOSIS — L0291 Cutaneous abscess, unspecified: Secondary | ICD-10-CM | POA: Insufficient documentation

## 2011-05-02 LAB — URINALYSIS, ROUTINE W REFLEX MICROSCOPIC
Bilirubin Urine: NEGATIVE
Leukocytes, UA: NEGATIVE
Nitrite: NEGATIVE
Urobilinogen, UA: 0.2 (ref 0.0–1.0)

## 2011-05-02 LAB — CBC WITH DIFFERENTIAL/PLATELET
Basophils Absolute: 0 10*3/uL (ref 0.0–0.1)
HCT: 42.3 % (ref 39.0–52.0)
Lymphs Abs: 1.2 10*3/uL (ref 0.7–4.0)
Monocytes Absolute: 0.5 10*3/uL (ref 0.1–1.0)
Monocytes Relative: 9 % (ref 3.0–12.0)
Neutrophils Relative %: 65.3 % (ref 43.0–77.0)
Platelets: 253 10*3/uL (ref 150.0–400.0)
RDW: 13.2 % (ref 11.5–14.6)

## 2011-05-02 LAB — LIPID PANEL
HDL: 37.5 mg/dL — ABNORMAL LOW (ref >39.00)
Total CHOL/HDL Ratio: 4
Triglycerides: 100 mg/dL (ref 0.0–149.0)
VLDL: 20 mg/dL (ref 0.0–40.0)

## 2011-05-02 LAB — COMPREHENSIVE METABOLIC PANEL
Albumin: 4 g/dL (ref 3.5–5.2)
CO2: 26 mEq/L (ref 19–32)
Calcium: 8.9 mg/dL (ref 8.4–10.5)
GFR: 105.06 mL/min (ref >60.00)
Glucose, Bld: 107 mg/dL — ABNORMAL HIGH (ref 70–99)
Potassium: 4.2 mEq/L (ref 3.5–5.1)
Sodium: 141 mEq/L (ref 135–145)
Total Protein: 7.2 g/dL (ref 6.0–8.3)

## 2011-05-02 MED ORDER — ATORVASTATIN CALCIUM 20 MG PO TABS: 20.0000 mg | ORAL_TABLET | Freq: Every day | ORAL | Status: DC

## 2011-05-02 MED ORDER — SULFAMETHOXAZOLE-TRIMETHOPRIM 800-160 MG PO TABS: 1.0000 | ORAL_TABLET | Freq: Two times a day (BID) | ORAL | Status: AC

## 2011-05-02 MED ORDER — VARENICLINE TARTRATE 1 MG PO TABS: 1.0000 mg | ORAL_TABLET | Freq: Two times a day (BID) | ORAL | Status: AC

## 2011-05-02 MED ORDER — VARENICLINE TARTRATE 0.5 MG X 11 & 1 MG X 42 PO MISC: ORAL | Status: AC

## 2011-05-02 NOTE — Assessment & Plan Note (Signed)
Start bactrim (?MRSA), follow closely for improvement and monitor for any signs of abscess formation

## 2011-05-02 NOTE — Assessment & Plan Note (Signed)
Labs ordered to look for secondary causes

## 2011-05-02 NOTE — Assessment & Plan Note (Signed)
I will check his A1C and advise if needed

## 2011-05-02 NOTE — Patient Instructions (Addendum)
Health Maintenance in Males MAINTAIN REGULAR HEALTH EXAMS  Maintain a healthy diet and normal weight. Increased weight leads to problems with blood pressure and diabetes. Decrease fat in the diet and increase exercise. Obtain a proper diet from your caregiver if necessary.   High blood pressure causes heart and blood vessel problems. Check blood pressures regularly and keep your blood pressure at normal limits. Aerobic exercise helps this. Persistent elevations of blood pressure should be treated with medications if weight loss and exercise are ineffective.   Avoid smoking, drinking in excess (more than 2 drinks per day), or use of street drugs. Do not share needles with anyone. Ask for help if you need assistance or instructions on stopping the use of alcohol, cigarettes, or drugs.   Maintain normal blood lipids and cholesterol. Your caregiver can give you information to lower your risk of heart disease or stroke.   Ask your caregiver if you are in need of early heart disease screening because of a strong family history of heart disease or signs of elevated testosterone (male sex hormone) levels. These can predispose you to early heart disease.   Practice safe sex. Practicing safe sex decreases your risk for a sexually transmitted infection (STI). Some of the STIs are gonorrhea, chlamydia, syphilis, trichimonas, herpes, human papillomavirus (HPV), and human immunodeficiency virus (HIV). Herpes, HIV, and HPV are viral illnesses that have no cure. These can result in disability, cancer, and death.   It is not safe for someone who has AIDS or is HIV positive to have unprotected sex with a partner who is HIV positive. The reason for this is the fact that there are many different strains of HIV. If you have a strain that is readily treated with medications and then suddenly introduce a strain from a partner that has no further treatment options, you may suddenly have a strain of HIV that is untreatable.  Even if you are both positive for HIV, it is still necessary to practice safe sex.   Use sunscreen with a SPF of 15 or greater. Being outside in the sun when your shadow caused by the sun is shorter than you are, means you are being exposed to sun at greater intensity. Lighter skinned people are at a greater risk of skin cancer.   Keep carbon monoxide and smoke detectors in your home and functioning at all times. Change the batteries every 6 months.   Do monthly examinations of your testicles. The best time to do this is after a hot shower or bath when the tissues are loose. Notify your caregivers of any lumps, tenderness, or changes in size or shape.   Notify your caregiver of new moles or changes in moles, especially if there is a change in shape or color. Also notify your caregiver if a mole is larger than the size of a pencil eraser.   Stay current with your tetanus shots and other required immunizations.  The Body Mass Index (BMI) is a way of measuring how much of your body is fat. Having a BMI above 27 increases the risk of heart disease, diabetes, hypertension, stroke, and other problems related to obesity. Document Released: 05/12/2008 Document Re-Released: 05/04/2010 [facility] Patient Information 2011 [place], [place].Smoking Cessation This document explains the best ways for you to quit smoking and new treatments to help. It lists new medicines that can double or triple your chances of quitting and quitting for good. It also considers ways to avoid relapses and concerns you may have about quitting,  including weight gain. NICOTINE: A POWERFUL ADDICTION If you have tried to quit smoking, you know how hard it can be. It is hard because nicotine is a very addictive drug. For some people, it can be as addictive as heroin or cocaine. Usually, people make 2 or 3 tries, or more, before finally being able to quit. Each time you try to quit, you can learn about what helps and what hurts. Quitting takes  hard work and a lot of effort, but you can quit smoking. QUITTING SMOKING IS ONE OF THE MOST IMPORTANT THINGS YOU WILL EVER DO:  You will live longer, feel better, and live better.   The impact on your body of quitting smoking is felt almost immediately:   Within 20 minutes, blood pressure decreases. Pulse returns to its normal level.   After 8 hours, carbon monoxide levels in the blood return to normal. Oxygen level increases.   After 24 hours, chance of heart attack starts to decrease. Breath, hair, and body stop smelling like smoke.   After 48 hours, damaged nerve endings begin to recover. Sense of taste and smell improve.   After 72 hours, the body is virtually free of nicotine. Bronchial tubes relax and breathing becomes easier.   After 2 to 12 weeks, lungs can hold more air. Exercise becomes easier and circulation improves.   Quitting will lower your chance of having a heart attack, stroke, cancer, or lung disease:   After 1 year, the risk of coronary heart disease is cut in half.   After 5 years, the risk of stroke falls to the same as a nonsmoker.   After 10 years, the risk of lung cancer is cut in half and the risk of other cancers decreases significantly.   After 15 years, the risk of coronary heart disease drops, usually to the level of a nonsmoker.   If you are pregnant, quitting smoking will improve your chances of having a healthy baby.   The people you live with, especially your children, will be healthier.   You will have extra money to spend on things other than cigarettes.  FIVE KEYS TO QUITTING Studies have shown that these 5 steps will help you quit smoking and quit for good. You have the best chances of quitting if you use them together: 1. Get ready.  2. Get support and encouragement.  3. Learn new skills and behaviors.  4. Get medicine to reduce your nicotine addiction and use it correctly.  5. Be prepared for relapse or difficult situations. Be  determined to continue trying to quit, even if you do not succeed at first.  1. GET READY  Set a quit date.   Change your environment.   Get rid of ALL cigarettes, ashtrays, matches, and lighters in your home, car, and place of work.   Do not let people smoke in your home.   Review your past attempts to quit. Think about what worked and what did not.   Once you quit, do not smoke. NOT EVEN A PUFF!  2. GET SUPPORT AND ENCOURAGEMENT Studies have shown that you have a better chance of being successful if you have help. You can get support in many ways.  Tell your family, friends, and coworkers that you are going to quit and need their support. Ask them not to smoke around you.   Talk to your caregivers (doctor, dentist, nurse, pharmacist, psychologist, and/or smoking counselor).   Get individual, group, or telephone counseling and support. The more  counseling you have, the better your chances are of quitting. Programs are available at [employer] and health centers. Call your local health department for information about programs in your area.   Spiritual beliefs and practices may help some smokers quit.   Quit meters are Photographer that keep track of quit statistics, such as amount of "quit-time," cigarettes not smoked, and money saved.   Many smokers find one or more of the many self-help books available useful in helping them quit and stay off tobacco.  3. LEARN NEW SKILLS AND BEHAVIORS  Try to distract yourself from urges to smoke. Talk to someone, go for a walk, or occupy your time with a task.   When you first try to quit, change your routine. Take a different route to work. Drink tea instead of coffee. Eat breakfast in a different place.   Do something to reduce your stress. Take a hot bath, exercise, or read a book.   Plan something enjoyable to do every day. Reward yourself for not smoking.   Explore interactive web-based programs  that specialize in helping you quit.  4. GET MEDICINE AND USE IT CORRECTLY Medicines can help you stop smoking and decrease the urge to smoke. Combining medicine with the above behavioral methods and support can quadruple your chances of successfully quitting smoking. The U[place]. Food and Drug Administration (FDA) has approved 7 medicines to help you quit smoking. These medicines fall into 3 categories.  Nicotine replacement therapy (delivers nicotine to your body without the negative effects and risks of smoking):   Nicotine gum: Available over-the-counter.   Nicotine lozenges: Available over-the-counter.   Nicotine inhaler: Available by prescription.   Nicotine nasal spray: Available by prescription.   Nicotine skin patches (transdermal): Available by prescription and over-the-counter.   Antidepressant medicine (helps people abstain from smoking, but how this works is unknown):   Bupropion sustained-release (SR) tablets: Available by prescription.   Nicotinic receptor partial agonist (simulates the effect of nicotine in your brain):   Varenicline tartrate tablets: Available by prescription.   Ask your caregiver for advice about which medicines to use and how to use them. Carefully read the information on the package.   Everyone who is trying to quit may benefit from using a medicine. If you are pregnant or trying to become pregnant, nursing an infant, you are under age 35, or you smoke fewer than 10 cigarettes per day, talk to your caregiver before taking any nicotine replacement medicines.   You should stop using a nicotine replacement product and call your caregiver if you experience nausea, dizziness, weakness, vomiting, fast or irregular heartbeat, mouth problems with the lozenge or gum, or redness or swelling of the skin around the patch that does not go away.   Do not use any other product containing nicotine while using a nicotine replacement product.   Talk to your caregiver  before using these products if you have diabetes, heart disease, asthma, stomach ulcers, you had a recent heart attack, you have high blood pressure that is not controlled with medicine, a history of irregular heartbeat, or you have been prescribed medicine to help you quit smoking.  5. BE PREPARED FOR RELAPSE OR DIFFICULT SITUATIONS  Most relapses occur within the first 3 months after quitting. Do not be discouraged if you start smoking again. Remember, most people try several times before they finally quit.   You may have symptoms of withdrawal because your body is used to nicotine. You  may crave cigarettes, be irritable, feel very hungry, cough often, get headaches, or have difficulty concentrating.   The withdrawal symptoms are only temporary. They are strongest when you first quit, but they will go away within 10 to 14 days.  Here are some difficult situations to watch for:  Alcohol. Avoid drinking alcohol. Drinking lowers your chances of successfully quitting.   Caffeine. Try to reduce the amount of caffeine you consume. It also lowers your chances of successfully quitting.   Other smokers. Being around smoking can make you want to smoke. Avoid smokers.   Weight gain. Many smokers will gain weight when they quit, usually less than 10 pounds. Eat a healthy diet and stay active. Do not let weight gain distract you from your main goal, quitting smoking. Some medicines that help you quit smoking may also help delay weight gain. You can always lose the weight gained after you quit.   Bad mood or depression. There are a lot of ways to improve your mood other than smoking.  If you are having problems with any of these situations, talk to your caregiver. SPECIAL SITUATIONS OR CONDITIONS Studies suggest that everyone can quit smoking. Your situation or condition can give you a special reason to quit.  Pregnant women/New mothers: By quitting, you protect your baby's health and your own.    Hospitalized patients: By quitting, you reduce health problems and help healing.   Heart attack patients: By quitting, you reduce your risk of a second heart attack.   Lung, head, and neck cancer patients: By quitting, you reduce your chance of a second cancer.   Parents of children and adolescents: By quitting, you protect your children from illnesses caused by secondhand smoke.  QUESTIONS TO THINK ABOUT Think about the following questions before you try to stop smoking. You may want to talk about your answers with your caregiver.  Why do you want to quit?   If you tried to quit in the past, what helped and what did not?   What will be the most difficult situations for you after you quit? How will you plan to handle them?   Who can help you through the tough times? Your family? Friends? Caregiver?   What pleasures do you get from smoking? What ways can you still get pleasure if you quit?  Here are some questions to ask your caregiver:  How can you help me to be successful at quitting?   What medicine do you think would be best for me and how should I take it?   What should I do if I need more help?   What is smoking withdrawal like? How can I get information on withdrawal?  Quitting takes hard work and a lot of effort, but you can quit smoking. FOR MORE INFORMATION Smokefree.gov (http://www.davis-sullivan.com/) provides free, accurate, evidence-based information and professional assistance to help support the immediate and long-term needs of people trying to quit smoking. Document Released: 11/08/2001 Document Re-Released: 05/04/2010 [facility] Patient Information 2011 [place], [place].Cellulitis Cellulitis is an infection of the skin and the tissue beneath it. The area is typically red and tender. It is caused by germs (bacteria) (usually staph or strep) that enter the body through cuts or sores. Cellulitis most commonly occurs in the arms or lower legs.  HOME CARE INSTRUCTIONS  If  you are given a prescription for medications which kill germs (antibiotics), take as directed until finished.   If the infection is on the arm or leg, keep the limb  elevated as able.   Use a warm cloth several times per day to relieve pain and encourage healing.   See your caregiver for recheck of the infected site in 3 days or sooner if problems arise.   Only take over-the-counter or prescription medicines for pain, discomfort, or fever as directed by your caregiver.  SEEK MEDICAL CARE IF:  An oral temperature above 100.5 develops, not controlled by medication.   The area of redness (inflammation) is spreading, there are red streaks coming from the infected site, or if a part of the infection begins to turn dark in color.   The joint or bone underneath the infected skin becomes painful after the skin has healed.   The infection returns in the same or another area after it seems to have gone away.   A boil or bump swells up. This may be an abscess.   New, unexplained problems such as pain or fever develop.  SEEK IMMEDIATE MEDICAL CARE IF:  You or your child feels drowsy or lethargic.   There is vomiting, diarrhea, or lasting discomfort or feeling ill (malaise) with muscle aches and pains.  MAKE SURE YOU:   Understand these instructions.   Will watch your condition.   Will get help right away if you are not doing well or get worse.  Document Released: 08/24/2005 Document Re-Released: 09/11/2009 [facility] Patient Information 2011 [place], [place].

## 2011-05-02 NOTE — Assessment & Plan Note (Signed)
STD tests ordered.

## 2011-05-02 NOTE — Assessment & Plan Note (Signed)
He wants to quit smoking to I offered info and an Rx for Chantix

## 2011-05-02 NOTE — Assessment & Plan Note (Signed)
He has some s/s so I will check his PSA today

## 2011-05-02 NOTE — Progress Notes (Signed)
Subjective:    Patient ID: [name], male    DOB: 04/08/1961, 50 y.o.   MRN: [id number]  Diabetes He presents for his follow-up diabetic visit. He has type 2 diabetes mellitus. His disease course has been improving. There are no hypoglycemic associated symptoms. Pertinent negatives for hypoglycemia include no confusion, dizziness, headaches, nervousness/anxiousness, pallor, seizures, speech difficulty or tremors. There are no diabetic associated symptoms. Pertinent negatives for diabetes include no chest pain, no fatigue and no weakness. Symptoms are stable. Current diabetic treatment includes diet. He is compliant with treatment some of the time. His weight is stable. He is following a generally healthy diet. Meal planning includes avoidance of concentrated sweets. He has not had a previous visit with a dietician. He participates in exercise intermittently. There is no change in his home blood glucose trend. His breakfast blood glucose range is generally 70-90 mg/dl. His lunch blood glucose range is generally 90-110 mg/dl. His dinner blood glucose range is generally 90-110 mg/dl. His highest blood glucose is 90-110 mg/dl. His overall blood glucose range is 90-110 mg/dl. An ACE inhibitor/angiotensin II receptor blocker is not being taken. He does not see a podiatrist.Eye exam is not current.  Erectile Dysfunction This is a chronic problem. The current episode started more than 1 year ago. The problem is unchanged. The nature of his difficulty is maintaining erection and penetration. He reports no anxiety, decreased libido or performance anxiety. Irritative symptoms include frequency and nocturia. Irritative symptoms do not include urgency. Obstructive symptoms include dribbling and a slower stream. Obstructive symptoms do not include incomplete emptying, an intermittent stream, straining or a weak stream. Pertinent negatives include no chills, dysuria, genital pain, hematuria, hesitancy or inability to  urinate. The symptoms are aggravated by nothing. Past treatments include sildenafil. The treatment provided significant relief. He has been using treatment for 1 to 6 months. He has had nasal congestion caused by medications.  He has an area of infection in his right groin. He wants to quit smoking. He stopped Lipitor b/c he did not think he needed it anymore. He has been exposed to an STD and wants to be tested. He wants a complete physical today. He saw Cardiology about 4 months ago and he tells me that his heart is okay.    Review of Systems  Constitutional: Negative for fever, chills, diaphoresis, activity change, appetite change and fatigue.  HENT: Negative for sore throat, facial swelling, trouble swallowing, neck pain, neck stiffness and voice change.   Eyes: Negative for photophobia and visual disturbance.  Respiratory: Negative for apnea, cough, choking, chest tightness, shortness of breath, wheezing and stridor.   Cardiovascular: Negative for chest pain, palpitations and leg swelling.  Gastrointestinal: Negative for nausea, vomiting, abdominal pain, diarrhea, constipation, blood in stool, abdominal distention, anal bleeding and rectal pain.  Genitourinary: Positive for frequency and nocturia. Negative for dysuria, hesitancy, urgency, hematuria, flank pain, decreased urine volume, discharge, penile swelling, scrotal swelling, enuresis, difficulty urinating, genital sores, penile pain, testicular pain, decreased libido and incomplete emptying.  Musculoskeletal: Negative for myalgias, back pain, joint swelling, arthralgias and gait problem.  Skin: Positive for wound (he has an area in his right groin for several weeks that feels red and irritated). Negative for color change, pallor and rash.  Neurological: Negative for dizziness, tremors, seizures, syncope, facial asymmetry, speech difficulty, weakness, light-headedness, numbness and headaches.  Hematological: Negative for adenopathy.  Does not bruise/bleed easily.  Psychiatric/Behavioral: Negative for suicidal ideas, hallucinations, behavioral problems, confusion, sleep disturbance, self-injury, dysphoric  mood, decreased concentration and agitation. The patient is not nervous/anxious and is not hyperactive.        Objective:   Physical Exam  Vitals reviewed. Constitutional: He appears well-developed and well-nourished. No distress.  HENT:  Head: Normocephalic and atraumatic.  Right Ear: External ear normal.  Left Ear: External ear normal.  Nose: Nose normal.  Mouth/Throat: Oropharynx is clear and moist. No oropharyngeal exudate.  Eyes: Conjunctivae and EOM are normal. Pupils are equal, round, and reactive to light. Right eye exhibits no discharge. Left eye exhibits no discharge. No scleral icterus.  Neck: Normal range of motion. Neck supple. No JVD present. No tracheal deviation present. No thyromegaly present.  Cardiovascular: Normal rate, regular rhythm, normal heart sounds and intact distal pulses.  Exam reveals no gallop and no friction rub.   No murmur heard. Pulmonary/Chest: Effort normal and breath sounds normal. No stridor. No respiratory distress. He has no wheezes. He has no rales. He exhibits no tenderness.  Abdominal: Soft. Bowel sounds are normal. He exhibits no distension and no mass. There is no tenderness. There is no rebound and no guarding. Hernia confirmed negative in the right inguinal area and confirmed negative in the left inguinal area.  Genitourinary: Testes normal and penis normal. Rectal exam shows no external hemorrhoid, no internal hemorrhoid, no fissure, no mass, no tenderness and anal tone normal. Guaiac negative stool.    Prostate is enlarged (there is 1+ bilateral symmetrical BPH with no nodules or ttp). Prostate is not tender. Right testis shows no mass, no swelling and no tenderness. Left testis shows no mass, no swelling and no tenderness. Circumcised. No penile erythema or penile  tenderness. No discharge found.       In the right groin there is a 1 x 0.5 cm area of mild erythema and induration but no fluctuance, exudate, streaking  Lymphadenopathy:    He has no cervical adenopathy.       Right: No inguinal adenopathy present.       Left: No inguinal adenopathy present.  Skin: He is not diaphoretic.        Lab Results  Component Value Date   WBC 4.7 06/30/2010   HGB 15.3 06/30/2010   HCT 45.0 06/30/2010   PLT 247 06/30/2010   CHOL  Value: 170        ATP III CLASSIFICATION:  <200     mg/dL   Desirable  045-409  mg/dL   Borderline High  >=811    mg/dL   High        07/29/4781   TRIG 190* 07/02/2010   HDL 37* 07/02/2010   LDLDIRECT 132.4 07/29/2008   ALT 22 07/01/2010   AST 16 07/01/2010   NA 141 07/01/2010   K 4.1 07/01/2010   CL 104 07/01/2010   CREATININE 1.18 07/01/2010   BUN 10 07/01/2010   CO2 29 07/01/2010   TSH 1.454 07/01/2010   PSA 2.08 12/11/2009   HGBA1C  Value: 5.9 (NOTE)                                                                       According to the ADA Clinical Practice Recommendations for 2011, when HbA1c is used as a screening test:   >=  6.5%   Diagnostic of Diabetes Mellitus           (if abnormal result  is confirmed)  5.7-6.4%   Increased risk of developing Diabetes Mellitus  References:Diagnosis and Classification of Diabetes Mellitus,Diabetes Care,2011,34(Suppl 1):S62-S69 and Standards of Medical Care in         Diabetes - 2011,Diabetes Care,2011,34  (Suppl 1):S11-S61.* 07/01/2010   MICROALBUR 3.1* 12/18/2008    Assessment & Plan:

## 2011-05-02 NOTE — Assessment & Plan Note (Signed)
Routine labs ordered and pt ed material was given

## 2011-05-03 ENCOUNTER — Encounter: Payer: Self-pay | Admitting: Internal Medicine

## 2011-05-03 LAB — HSV 2 ANTIBODY, IGG: HSV 2 Glycoprotein G Ab, IgG: 10.72 IV — ABNORMAL HIGH

## 2011-05-03 LAB — CHLAMYDIA PROBE AMPLIFICATION, URINE: Chlamydia, Swab/Urine, PCR: NEGATIVE

## 2011-05-03 LAB — TESTOSTERONE, FREE, TOTAL, SHBG
Sex Hormone Binding: 39 nmol/L (ref 13–71)
Testosterone: 363.47 ng/dL (ref 250–890)

## 2011-05-03 LAB — GC PROBE AMPLIFICATION, URINE: GC Probe Amp, Urine: NEGATIVE

## 2011-05-14 ENCOUNTER — Emergency Department (HOSPITAL_COMMUNITY)
Admission: EM | Admit: 2011-05-14 | Discharge: 2011-05-14 | Disposition: A | Discharge status: Home / Self Care | Payer: Worker's Compensation | Attending: Emergency Medicine | Admitting: Emergency Medicine

## 2011-05-14 ENCOUNTER — Emergency Department (HOSPITAL_COMMUNITY): Payer: Worker's Compensation

## 2011-05-14 DIAGNOSIS — M545 Low back pain, unspecified: Secondary | ICD-10-CM | POA: Insufficient documentation

## 2011-05-14 DIAGNOSIS — Y990 Civilian activity done for income or pay: Secondary | ICD-10-CM | POA: Insufficient documentation

## 2011-05-14 DIAGNOSIS — E7800 Pure hypercholesterolemia, unspecified: Secondary | ICD-10-CM | POA: Insufficient documentation

## 2011-05-14 DIAGNOSIS — M546 Pain in thoracic spine: Secondary | ICD-10-CM | POA: Insufficient documentation

## 2011-05-14 DIAGNOSIS — X500XXA Overexertion from strenuous movement or load, initial encounter: Secondary | ICD-10-CM | POA: Insufficient documentation

## 2011-05-31 ENCOUNTER — Ambulatory Visit (INDEPENDENT_AMBULATORY_CARE_PROVIDER_SITE_OTHER): Payer: BC Managed Care – PPO | Admitting: Internal Medicine

## 2011-05-31 ENCOUNTER — Encounter: Payer: Self-pay | Admitting: Internal Medicine

## 2011-05-31 ENCOUNTER — Other Ambulatory Visit: Payer: Worker's Compensation

## 2011-05-31 VITALS — BP 120/80 | HR 78 | Temp 97.9°F | Wt 240.0 lb

## 2011-05-31 DIAGNOSIS — L0390 Cellulitis, unspecified: Secondary | ICD-10-CM

## 2011-05-31 DIAGNOSIS — L089 Local infection of the skin and subcutaneous tissue, unspecified: Secondary | ICD-10-CM

## 2011-05-31 DIAGNOSIS — L0291 Cutaneous abscess, unspecified: Secondary | ICD-10-CM

## 2011-05-31 DIAGNOSIS — L723 Sebaceous cyst: Secondary | ICD-10-CM

## 2011-05-31 MED ORDER — SULFAMETHOXAZOLE-TRIMETHOPRIM 800-160 MG PO TABS: 1.0000 | ORAL_TABLET | Freq: Two times a day (BID) | ORAL | Status: DC

## 2011-05-31 NOTE — Patient Instructions (Signed)
Abscess/Boil (Furuncle) An abscess (boil or furuncle) is an infected area that contains a collection of pus.  SYMPTOMS Signs and symptoms of an abscess include pain, tenderness, redness, or hardness. You may feel a moveable soft area under your skin. An abscess can occur anywhere in the body.  TREATMENT An incision (cut by the caregiver) may have been made over your abscess so the pus could be drained out. Gauze may have been packed into the space or a drain may have been looped thru the abscess cavity (pocket). This provides a drain that will allow the cavity to heal from the inside outwards. The abscess may be painful for a few days, but should feel much better if it was drained. Your abscess, if seen early, may not have localized and may not have been drained. If not, another appointment may be required if it does not get better on its own or with medications. HOME CARE INSTRUCTIONS  Only take over-the-counter or prescription medicines for pain, discomfort, or fever as directed by your caregiver.   Keep the skin and clothes clean around your abscess.   If the abscess was drained, you will need to use gauze dressing ("4x4") to collect any draining pus. These dressing typically will need to be changed 3 or more times during the day.   The infection may spread by skin contact with others. Avoid skin contact as much as possible.   Good hygiene is very important including regular hand washing, cover any draining skin lesions, and don't share personal care items.   If you participate in sports do not share athletic equipment, towels, whirlpools, or personal care items. Shower after every practice or tournament.   If a draining area cannot be adequately covered:   Do not participate in sports   Children should not participate in day care until the wound has healed or drainage stops.   If your caregiver has given you a follow-up appointment, it is very important to keep that appointment. Not  keeping the appointment could result in a much worse infection, chronic or permanent injury, pain, and disability. If there is any problem keeping the appointment, you must call back to this facility for assistance.  SEEK MEDICAL CARE IF:  You develop increased pain, swelling, redness, drainage, or bleeding in the wound site.   You develop signs of generalized infection including muscle aches, chills, fever, or a general ill feeling.   You or your child has an oral temperature above 100.5.   Your baby is older than 3 months with a rectal temperature of 100.5 F (38.1 C) or higher for more than 1 day.  See your caregiver as directed for a recheck or sooner if you develop any of the symptoms described above. Take antibiotics (medicine that kills germs) as directed if they were prescribed. MAKE SURE YOU:   Understand these instructions.   Will watch your condition.   Will get help right away if you are not doing well or get worse.  Document Released: 08/24/2005 Document Re-Released: 05/04/2010 [employer] Patient Information 2011 [employer]. 

## 2011-06-02 ENCOUNTER — Encounter: Payer: Self-pay | Admitting: Internal Medicine

## 2011-06-02 NOTE — Assessment & Plan Note (Signed)
Start bactrim-ds and await culture results. He will return in 2 days for packing removal and recheck.

## 2011-06-02 NOTE — Progress Notes (Signed)
  Subjective:    Patient ID: [name], male    DOB: August 07, 1961, 50 y.o.   MRN: [id number]  HPI  He returns and complains of a cyst on his left upper back for 10 days, he tells me that family members have pressed on it but nothing will drain out. It is painful and feels swollen.  Review of Systems  Constitutional: Negative.  Negative for fever.  HENT: Negative.   Eyes: Negative.   Respiratory: Negative.   Cardiovascular: Negative.   Gastrointestinal: Negative.   Genitourinary: Negative.   Musculoskeletal: Negative.   Neurological: Negative.   Hematological: Negative.   Psychiatric/Behavioral: Negative.        Objective:   Physical Exam  Vitals reviewed. Constitutional: He is oriented to person, place, and time. He appears well-developed and well-nourished. No distress.  HENT:  Head: Normocephalic and atraumatic.  Right Ear: External ear normal.  Left Ear: External ear normal.  Nose: Nose normal.  Mouth/Throat: Oropharynx is clear and moist. No oropharyngeal exudate.  Eyes: Conjunctivae and EOM are normal. Pupils are equal, round, and reactive to light. Right eye exhibits no discharge. Left eye exhibits no discharge. No scleral icterus.  Neck: Normal range of motion. Neck supple. No JVD present. No tracheal deviation present. No thyromegaly present.  Cardiovascular: Normal rate, regular rhythm, normal heart sounds and intact distal pulses.  Exam reveals no gallop and no friction rub.   No murmur heard. Pulmonary/Chest: Effort normal and breath sounds normal. No stridor. No respiratory distress. He has no wheezes. He has no rales. He exhibits no tenderness.  Abdominal: Soft. Bowel sounds are normal. He exhibits no distension and no mass. There is no tenderness. There is no rebound and no guarding.  Musculoskeletal: Normal range of motion. He exhibits no edema and no tenderness.  Lymphadenopathy:    He has no cervical adenopathy.  Neurological: He is alert and oriented to  person, place, and time. He has normal reflexes. He displays normal reflexes. No cranial nerve deficit. He exhibits normal muscle tone. Coordination normal.  Skin: Skin is warm and dry. No abrasion, no bruising, no ecchymosis, no lesion and no rash noted. He is not diaphoretic. No erythema. No pallor.          On his left upper back there is a 3 cm cystic lesion that has a large pore with surrounding fluctuance, erythema, warmth, and ttp. There is no streaking.  Psychiatric: He has a normal mood and affect. His behavior is normal. Judgment and thought content normal.         Lab Results  Component Value Date   WBC 5.3 05/02/2011   HGB 14.6 05/02/2011   HCT 42.3 05/02/2011   PLT 253.0 05/02/2011   CHOL 154 05/02/2011   TRIG 100.0 05/02/2011   HDL 37.50* 05/02/2011   LDLDIRECT 132.4 07/29/2008   ALT 27 05/02/2011   AST 24 05/02/2011   NA 141 05/02/2011   K 4.2 05/02/2011   CL 108 05/02/2011   CREATININE 1.0 05/02/2011   BUN 13 05/02/2011   CO2 26 05/02/2011   TSH 0.83 05/02/2011   PSA 1.92 05/02/2011   HGBA1C 6.2 05/02/2011   MICROALBUR 3.1* 12/18/2008   Assessment & Plan:

## 2011-06-02 NOTE — Assessment & Plan Note (Signed)
This was removed today.   

## 2011-06-03 ENCOUNTER — Ambulatory Visit (INDEPENDENT_AMBULATORY_CARE_PROVIDER_SITE_OTHER): Payer: BC Managed Care – PPO | Admitting: Internal Medicine

## 2011-06-03 ENCOUNTER — Encounter: Payer: Self-pay | Admitting: Internal Medicine

## 2011-06-03 VITALS — BP 112/74 | HR 72 | Temp 97.5°F | Resp 16 | Wt 237.0 lb

## 2011-06-03 DIAGNOSIS — L723 Sebaceous cyst: Secondary | ICD-10-CM

## 2011-06-03 DIAGNOSIS — L0291 Cutaneous abscess, unspecified: Secondary | ICD-10-CM

## 2011-06-03 DIAGNOSIS — L089 Local infection of the skin and subcutaneous tissue, unspecified: Secondary | ICD-10-CM

## 2011-06-03 DIAGNOSIS — L0390 Cellulitis, unspecified: Secondary | ICD-10-CM

## 2011-06-03 LAB — WOUND CULTURE

## 2011-06-04 NOTE — Assessment & Plan Note (Signed)
This has been removed.

## 2011-06-04 NOTE — Patient Instructions (Signed)
Wound Care     Good wound care will help reduce pain and prevent infection. Puncture wounds and deep cuts are more likely to become infected than scrapes and abrasions. There is a greater chance of infection if the injured person is diabetic and/or has a weakened immune system (such as when a cancer patient is being treated with chemotherapy and/or radiation).     HOME CARE INSTRUCTIONS  General wound care treatment includes:   Rest and elevate the injured area until the pain and swelling are better.   All foreign material like bits of dirt must be washed off.   Wounds should be cleaned daily with gentle soap and water.   Dressings may be needed to protect the wound from further damage.   Apply antibiotic cream or ointment after the wound has been cleaned.     SEEK MEDICAL CARE IF YOU DEVELOP:   Increased redness or swelling in or around the wound.   Increasing pain.     SEEK IMMEDIATE MEDICAL CARE IF YOU DEVELOP:   Temperature of 102F (38.9 C) or higher.   A pus like drainage from the wound.   Severe pain not controlled with non-prescription or prescription pain relievers.   Inability to move fingers or toes if the wound affects these areas.   Red streaking of the skin extending above and/or below the wound.     A tetanus shot may be needed if you have not had a booster within the past 5 years.     Document Released: 12/22/2004  Document Re-Released: 02/08/2010  [employer] Patient Information 2011 [employer].

## 2011-06-04 NOTE — Assessment & Plan Note (Signed)
Culture was negative so he will stop the antibiotics, he will keep the wound clean, covered, and protected and will let me know if any symptoms develop.

## 2011-06-04 NOTE — Progress Notes (Signed)
  Subjective:    Patient ID: [name], male    DOB: 1961/05/26, 50 y.o.   MRN: [id number]  HPI He returns for f/up and he tells me that the wound is healing nicely with no more drainage and much less pain and redness. The culture was negative.    Review of Systems  Constitutional: Negative.   HENT: Negative.   Eyes: Negative.   Respiratory: Negative.   Cardiovascular: Negative.   Gastrointestinal: Negative.   Genitourinary: Negative.   Musculoskeletal: Negative.   Skin: Negative.   Neurological: Negative.   Hematological: Negative.   Psychiatric/Behavioral: Negative.        Objective:   Physical Exam  Vitals reviewed. Skin: Skin is warm and dry. He is not diaphoretic. No pallor.       The area of I and D on the left upper back was checked today and the packing was removed, there was no additional exudate noted, there was a small piece of necrotic EIC that was removed. No additional packing was done. There is no ttp, erythema, streaking, fluctuance, or induration.          Assessment & Plan:

## 2011-08-19 ENCOUNTER — Ambulatory Visit (INDEPENDENT_AMBULATORY_CARE_PROVIDER_SITE_OTHER): Payer: BC Managed Care – PPO | Admitting: Internal Medicine

## 2011-08-19 ENCOUNTER — Encounter: Payer: Self-pay | Admitting: Internal Medicine

## 2011-08-19 VITALS — BP 130/82 | HR 80 | Temp 99.3°F | Resp 16

## 2011-08-19 DIAGNOSIS — J019 Acute sinusitis, unspecified: Secondary | ICD-10-CM

## 2011-08-19 DIAGNOSIS — E119 Type 2 diabetes mellitus without complications: Secondary | ICD-10-CM

## 2011-08-19 MED ORDER — MOXIFLOXACIN HCL 400 MG PO TABS: 400.0000 mg | ORAL_TABLET | Freq: Every day | ORAL | Status: AC

## 2011-08-19 NOTE — Progress Notes (Signed)
Subjective:    Patient ID: [name], male    DOB: 1961-07-30, 50 y.o.   MRN: [id number]  URI  This is a new problem. The current episode started in the past 7 days. The problem has been gradually worsening. There has been no fever. Associated symptoms include congestion, rhinorrhea, sinus pain, sneezing and a sore throat. Pertinent negatives include no abdominal pain, chest pain, coughing, diarrhea, dysuria, ear pain, headaches, joint pain, joint swelling, nausea, neck pain, plugged ear sensation, rash, swollen glands, vomiting or wheezing. He has tried nothing for the symptoms.      Review of Systems  Constitutional: Positive for chills. Negative for fever, diaphoresis, activity change, appetite change and fatigue.  HENT: Positive for congestion, sore throat, rhinorrhea, sneezing, postnasal drip and sinus pressure. Negative for ear pain, facial swelling, trouble swallowing, neck pain and voice change.   Eyes: Negative for photophobia, pain, discharge, redness, itching and visual disturbance.  Respiratory: Negative for apnea, cough, choking, chest tightness, shortness of breath, wheezing and stridor.   Cardiovascular: Negative for chest pain, palpitations and leg swelling.  Gastrointestinal: Negative for nausea, vomiting, abdominal pain, diarrhea, constipation, blood in stool, abdominal distention, anal bleeding and rectal pain.  Genitourinary: Negative for dysuria, urgency, frequency, hematuria, flank pain, decreased urine volume, enuresis, difficulty urinating, genital sores and testicular pain.  Musculoskeletal: Negative for myalgias, back pain, joint pain, joint swelling, arthralgias and gait problem.  Skin: Negative for color change, pallor, rash and wound.  Neurological: Negative for dizziness, tremors, seizures, syncope, facial asymmetry, speech difficulty, weakness, light-headedness, numbness and headaches.  Hematological: Negative for adenopathy. Does not bruise/bleed easily.    Psychiatric/Behavioral: Negative for suicidal ideas, hallucinations, behavioral problems, confusion, sleep disturbance, self-injury, dysphoric mood, decreased concentration and agitation. The patient is not nervous/anxious and is not hyperactive.        Objective:   Physical Exam  Vitals reviewed. Constitutional: He is oriented to person, place, and time. He appears well-developed and well-nourished. No distress.  HENT:  Mouth/Throat: Oropharynx is clear and moist. No oropharyngeal exudate.  Eyes: Conjunctivae are normal. Right eye exhibits no discharge. Left eye exhibits no discharge. No scleral icterus.  Neck: Normal range of motion. Neck supple. No JVD present. No tracheal deviation present. No thyromegaly present.  Cardiovascular: Normal rate, regular rhythm, normal heart sounds and intact distal pulses.  Exam reveals no gallop and no friction rub.   No murmur heard. Pulmonary/Chest: Effort normal and breath sounds normal. No stridor. No respiratory distress. He has no wheezes. He has no rales. He exhibits no tenderness.  Abdominal: Soft. Bowel sounds are normal. He exhibits no distension. There is no tenderness. There is no rebound and no guarding.  Musculoskeletal: Normal range of motion. He exhibits no edema and no tenderness.  Lymphadenopathy:    He has no cervical adenopathy.  Neurological: He is oriented to person, place, and time. He displays normal reflexes. He exhibits normal muscle tone. Coordination normal.  Skin: Skin is warm and dry. No rash noted. He is not diaphoretic. No erythema. No pallor.  Psychiatric: He has a normal mood and affect. His behavior is normal. Judgment and thought content normal.      Lab Results  Component Value Date   WBC 5.3 05/02/2011   HGB 14.6 05/02/2011   HCT 42.3 05/02/2011   PLT 253.0 05/02/2011   CHOL 154 05/02/2011   TRIG 100.0 05/02/2011   HDL 37.50* 05/02/2011   LDLDIRECT 132.4 07/29/2008   ALT 27 05/02/2011   AST  24 05/02/2011   NA 141 05/02/2011    K 4.2 05/02/2011   CL 108 05/02/2011   CREATININE 1.0 05/02/2011   BUN 13 05/02/2011   CO2 26 05/02/2011   TSH 0.83 05/02/2011   PSA 1.92 05/02/2011   HGBA1C 6.2 05/02/2011   MICROALBUR 3.1* 12/18/2008      Assessment & Plan:

## 2011-08-19 NOTE — Patient Instructions (Signed)

## 2011-08-21 ENCOUNTER — Encounter: Payer: Self-pay | Admitting: Internal Medicine

## 2011-08-21 NOTE — Assessment & Plan Note (Signed)
His recent A1C looks good

## 2011-08-21 NOTE — Assessment & Plan Note (Signed)
Start avelox for the infection 

## 2012-01-17 ENCOUNTER — Encounter (HOSPITAL_COMMUNITY): Payer: Self-pay | Admitting: Emergency Medicine

## 2012-01-17 ENCOUNTER — Emergency Department (HOSPITAL_COMMUNITY)
Admission: EM | Admit: 2012-01-17 | Discharge: 2012-01-17 | Disposition: A | Discharge status: Home / Self Care | Payer: Worker's Compensation | Attending: Emergency Medicine | Admitting: Emergency Medicine

## 2012-01-17 DIAGNOSIS — F17200 Nicotine dependence, unspecified, uncomplicated: Secondary | ICD-10-CM | POA: Insufficient documentation

## 2012-01-17 DIAGNOSIS — R29898 Other symptoms and signs involving the musculoskeletal system: Secondary | ICD-10-CM | POA: Insufficient documentation

## 2012-01-17 DIAGNOSIS — M79609 Pain in unspecified limb: Secondary | ICD-10-CM | POA: Insufficient documentation

## 2012-01-17 DIAGNOSIS — E119 Type 2 diabetes mellitus without complications: Secondary | ICD-10-CM | POA: Insufficient documentation

## 2012-01-17 DIAGNOSIS — M25539 Pain in unspecified wrist: Secondary | ICD-10-CM | POA: Insufficient documentation

## 2012-01-17 DIAGNOSIS — M659 Unspecified synovitis and tenosynovitis, unspecified site: Secondary | ICD-10-CM | POA: Insufficient documentation

## 2012-01-17 DIAGNOSIS — E7800 Pure hypercholesterolemia, unspecified: Secondary | ICD-10-CM | POA: Insufficient documentation

## 2012-01-17 DIAGNOSIS — Z79899 Other long term (current) drug therapy: Secondary | ICD-10-CM | POA: Insufficient documentation

## 2012-01-17 DIAGNOSIS — J310 Chronic rhinitis: Secondary | ICD-10-CM | POA: Insufficient documentation

## 2012-01-17 DIAGNOSIS — I2510 Atherosclerotic heart disease of native coronary artery without angina pectoris: Secondary | ICD-10-CM | POA: Insufficient documentation

## 2012-01-17 DIAGNOSIS — M25439 Effusion, unspecified wrist: Secondary | ICD-10-CM | POA: Insufficient documentation

## 2012-01-17 MED ORDER — IBUPROFEN 800 MG PO TABS: 800.0000 mg | ORAL_TABLET | Freq: Three times a day (TID) | ORAL | Status: AC

## 2012-01-17 MED ORDER — HYDROCODONE-ACETAMINOPHEN 5-325 MG PO TABS: 1.0000 | ORAL_TABLET | Freq: Four times a day (QID) | ORAL | Status: AC | PRN

## 2012-01-17 NOTE — Discharge Instructions (Signed)
Tendinitis and Tenosynovitis  Tendinitis is inflammation of the tendon. Tenosynovitis is inflammation of the lining around the tendon (tendon sheath). These painful conditions often occur at once. Tendons attach muscle to bone. To move a limb, force from the muscle moves through the tendon, to the bone. These conditions often cause increased pain when moving. Tendinitis may be caused by a small or partial tear in the tendon.  SYMPTOMS   Pain, tenderness, redness, bruising, or swelling at the injury.   Loss of normal joint movement.   Pain that gets worse with use of the muscle and joint attached to the tendon.   Weakness in the tendon, caused by calcium build up that may occur with tendinitis.   Commonly affected tendons:   Achilles tendon (calf of leg).   Rotator cuff (shoulder joint).   Patellar tendon (kneecap to shin).   Peroneal tendon (ankle).   Posterior tibial tendon (inner ankle).   Biceps tendon (in front of shoulder).  CAUSES   Sudden strain on a flexed muscle, muscle overuse, sudden increase or change in activity, vigorous activity.   Result of a direct hit (less common).   Poor muscle action (biomechanics).  RISK INCREASES WITH:  Injury (trauma).   Too much exercise.   Sudden change in athletic activity.   Incorrect exercise form or technique.   Poor strength and flexibility.   Not warming-up properly before activity.   Returning to activity before healing is complete.  PREVENTION   Warm-up and stretch properly before activity.   Maintain physical fitness:   Joint flexibility.   Muscle strength and endurance.   Fitness that increases heart rate.   Learn and use proper exercise techniques.   Use rehabilitation exercises to strengthen weak muscles and tendons.   Ice the tendon after activity, to reduce recurring inflammation.   Wear proper fitting protective equipment for specific tendons, when indicated.  PROGNOSIS  When treated properly,  can be cured in 6 to 8 weeks. Recovery may take longer, depending on degree of injury.  RELATED COMPLICATIONS   Re-injury or recurring symptoms.   Permanent weakness or joint stiffness, if injury is severe and recovery is not completed.   Delayed healing, if sports are started before healing is complete.   Tearing apart (rupture) of the inflamed tendon. Tendinitis means the tendon is injured and must recover.  TREATMENT  Treatment first involves ice, medicine, and rest from aggravating activities. This reduces pain and inflammation. Modifying your activity may be considered to prevent recurring injury. A brace, elastic bandage wrap, splint, cast, or sling may be prescribed to protect the joint for a short period. After that period, strengthening and stretching exercise may help to regain strength and full range of motion. If the condition persists, despite non-surgical treatment, surgery may be recommended to remove the inflamed tendon lining. Corticosteroid injections may be given to reduce inflammation. However, these injections may weaken the tendon and increase your risk for tendon rupture. MEDICATION   If pain medicine is needed, nonsteroidal anti-inflammatory medicines (aspirin and ibuprofen), or other minor pain relievers (acetaminophen), are often recommended.   Do not take pain medicine for 7 days before surgery.   Prescription pain relievers are usually prescribed only after surgery. Use only as directed and only as much as you need.   Ointments applied to the skin may be helpful.   Corticosteroid injections may be given to reduce inflammation. However, this may increase your risk of a tendon rupture.  HEAT AND COLD  Cold   treatment (icing) relieves pain and reduces inflammation. Cold treatment should be applied for 10 to 15 minutes every 2 to 3 hours, and immediately after activity that aggravates your symptoms. Use ice packs or an ice massage.   Heat treatment may be used before  performing stretching and strengthening activities prescribed by your caregiver, physical therapist, or athletic trainer. Use a heat pack or a warm water soak.  SEEK MEDICAL CARE IF:   Symptoms get worse or do not improve, despite treatment.   Pain becomes too much to tolerate.   You develop numbness or tingling.   Toes become cold, or toenails become blue, gray, or dark colored.   New, unexplained symptoms develop. (Drugs used in treatment may produce side effects.)  Document Released: 11/14/2005 Document Revised: 07/27/2011 Document Reviewed: 02/26/2009 [facility] Patient Information 2012 [place], [place].

## 2012-01-17 NOTE — ED Provider Notes (Signed)
History     CSN: [id number]  Arrival date & time 01/17/12  6295   First MD Initiated Contact with Patient 01/17/12 0951     11:03 AM  HPI Patient reports left wrist pain has gradually begun of the last several days. Points to pain located at the radial surface of his left wrist. Reports feels like his wrists as slightly swollen as well. Reports she's having increasing difficulty with grabbing and holding things. States he is now unable to hold steering wheel. Denies erythema, injury. 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 Denies popping sound, or numbness. Patient is a 51 y.o. male presenting with wrist pain. The history is provided by the patient.  Wrist Pain This is a new problem. The current episode started in the past 7 days. The problem occurs constantly. The problem has been gradually worsening. Associated symptoms include joint swelling and weakness. Pertinent negatives include no chills, fever, myalgias, neck pain or numbness. Exacerbated by: Movements of left wrist. He has tried NSAIDs and oral narcotics for the symptoms. The treatment provided mild relief.    Past Medical History  Diagnosis Date  . Coronary atherosclerosis of unspecified type of vessel, native or graft   . Pure hypercholesterolemia   . Tobacco use disorder   . Type II or unspecified type diabetes mellitus without mention of complication, not stated as uncontrolled   . Obstructive sleep apnea (adult) (pediatric)   . Problems related to high-risk sexual behavior   . Cellulitis and abscess of unspecified site   . Dysmetabolic syndrome X   . Erectile dysfunction   . Chronic rhinitis   . Hypertrophy of prostate with urinary obstruction and other lower urinary tract symptoms (LUTS)   . Family history of malignant neoplasm of gastrointestinal tract     Past Surgical History  Procedure Date  . Ptca   . Incision and drainage abscess / hematoma of bursa / knee /  thigh     I&D of complex abscess,left axilla. I&D of large infected pilonidal abscess  . Cosmetic surgery     Family History  Problem Relation Age of Onset  . Cancer Mother     breast  . Cancer Maternal Grandmother     colon cancer  . Heart disease Maternal Grandfather   . Cancer Other     colon cancer 1st degree relative<60  . Heart disease Other     History  Substance Use Topics  . Smoking status: Current Everyday Smoker -- 1.0 packs/day for 25 years  . Smokeless tobacco: Not on file  . Alcohol Use: Yes      Review of Systems  Constitutional: Negative for fever and chills.  HENT: Negative for neck pain.   Musculoskeletal: Positive for joint swelling. Negative for myalgias.       Wrist pain and swelling.  Neurological: Positive for weakness. Negative for numbness.  All other systems reviewed and are negative.    Allergies  Penicillins  Home Medications   Current Outpatient Rx  Name Route Sig Dispense Refill  . ATORVASTATIN CALCIUM 20 MG PO TABS Oral Take 1 tablet (20 mg total) by mouth daily. 90 tablet 3  . TADALAFIL 20 MG PO TABS Oral Take 20 mg by mouth as directed. Take 1 tablet every 3-4 days     . ZOLPIDEM TARTRATE ER 12.5 MG PO TBCR Oral Take 12.5 mg by mouth at bedtime as needed.  BP 136/78  Pulse 91  Resp 18  Wt 235 lb (106.595 kg)  SpO2 99%  Physical Exam  Constitutional: He is oriented to person, place, and time. He appears well-developed and well-nourished.  HENT:  Head: Normocephalic and atraumatic.  Eyes: Pupils are equal, round, and reactive to light.  Musculoskeletal:       Left wrist: He exhibits decreased range of motion, tenderness and swelling. He exhibits no effusion, no crepitus, no deformity and no laceration.       Arms: Neurological: He is alert and oriented to person, place, and time.  Skin: Skin is warm and dry. No rash noted. No erythema. No pallor.  Psychiatric: He has a normal mood and affect. His behavior is normal.      ED Course  Procedures   MDM   Discussed pain is likely due to overuse. Patient diagnosed with tenosynovitis. Will place patient in a thumb spica and referred to orthopedic physician. Advised if no improvement in one week to followup with orthopedic physician. Advised ibuprofen. Will give prescription of hydrocodone for additional pain relief if needed. Patient agrees to plan and is ready for discharge.       Thomasene Lot, PA-C 01/17/12 1106

## 2012-01-17 NOTE — ED Notes (Signed)
Swelling and pain in l/hand x 3 days. Denies injury

## 2012-01-19 NOTE — ED Provider Notes (Signed)
Medical screening examination/treatment/procedure(s) were performed by non-physician practitioner and as supervising physician I was immediately available for consultation/collaboration.  Mita Vallo, MD 01/19/12 1340 

## 2012-03-19 ENCOUNTER — Encounter: Payer: Self-pay | Admitting: Internal Medicine

## 2012-03-19 ENCOUNTER — Other Ambulatory Visit: Payer: BC Managed Care – PPO

## 2012-03-19 ENCOUNTER — Ambulatory Visit (INDEPENDENT_AMBULATORY_CARE_PROVIDER_SITE_OTHER): Payer: BC Managed Care – PPO | Admitting: Internal Medicine

## 2012-03-19 ENCOUNTER — Other Ambulatory Visit: Payer: Self-pay | Admitting: Internal Medicine

## 2012-03-19 VITALS — BP 120/82 | HR 88 | Temp 98.3°F | Resp 16 | Wt 237.0 lb

## 2012-03-19 DIAGNOSIS — G4733 Obstructive sleep apnea (adult) (pediatric): Secondary | ICD-10-CM

## 2012-03-19 DIAGNOSIS — L0390 Cellulitis, unspecified: Secondary | ICD-10-CM

## 2012-03-19 DIAGNOSIS — L0291 Cutaneous abscess, unspecified: Secondary | ICD-10-CM

## 2012-03-19 MED ORDER — HYDROCODONE-ACETAMINOPHEN 5-500 MG PO TABS: 2.0000 | ORAL_TABLET | Freq: Four times a day (QID) | ORAL | Status: AC | PRN

## 2012-03-19 MED ORDER — SULFAMETHOXAZOLE-TRIMETHOPRIM 800-160 MG PO TABS: 1.0000 | ORAL_TABLET | Freq: Two times a day (BID) | ORAL | Status: AC

## 2012-03-19 NOTE — Progress Notes (Signed)
  Subjective:    Patient ID: [name], male    DOB: Sep 30, 1961, 51 y.o.   MRN: [id number]  HPI  He returns c/o a one week history of worsening pain, redness, and swelling in his left axilla.  Review of Systems  Constitutional: Negative for fever, chills, diaphoresis, activity change, appetite change, fatigue and unexpected weight change.  HENT: Negative.   Eyes: Negative.   Respiratory: Negative for cough, chest tightness, shortness of breath, wheezing and stridor.   Cardiovascular: Negative for chest pain, palpitations and leg swelling.  Gastrointestinal: Negative.   Genitourinary: Negative.   Musculoskeletal: Negative.   Skin: Positive for color change. Negative for pallor, rash and wound.  Neurological: Negative.   Hematological: Negative for adenopathy. Does not bruise/bleed easily.  Psychiatric/Behavioral: Negative.        Objective:   Physical Exam  Skin:       In the left axilla there is a 3x4 cm area of erythema, warmth, fluctuance, induration, ttp  The area was prepped and draped in sterile fashion and local anesthesia was obtained with 2% lido with epi, 3 cc were used, an incision was made with a 6 mm punch and a copious amount of thick, purulent exudate was easily expressed, the cavity was explored with H2O2 and Qtips and several loculations were disrupted, there was no blood loss, a culture was obtained and sent, the cavity was packed with iodofrom, he tolerated the procedure well with no complications.          Assessment & Plan:

## 2012-03-19 NOTE — Patient Instructions (Signed)

## 2012-03-22 ENCOUNTER — Other Ambulatory Visit (INDEPENDENT_AMBULATORY_CARE_PROVIDER_SITE_OTHER): Payer: BC Managed Care – PPO

## 2012-03-22 ENCOUNTER — Ambulatory Visit (INDEPENDENT_AMBULATORY_CARE_PROVIDER_SITE_OTHER): Payer: BC Managed Care – PPO | Admitting: Internal Medicine

## 2012-03-22 ENCOUNTER — Ambulatory Visit: Payer: Self-pay | Admitting: Internal Medicine

## 2012-03-22 ENCOUNTER — Encounter: Payer: Self-pay | Admitting: Internal Medicine

## 2012-03-22 VITALS — BP 120/78 | HR 79 | Temp 97.3°F | Resp 16

## 2012-03-22 DIAGNOSIS — N401 Enlarged prostate with lower urinary tract symptoms: Secondary | ICD-10-CM

## 2012-03-22 DIAGNOSIS — N138 Other obstructive and reflux uropathy: Secondary | ICD-10-CM

## 2012-03-22 DIAGNOSIS — L0291 Cutaneous abscess, unspecified: Secondary | ICD-10-CM

## 2012-03-22 DIAGNOSIS — G4733 Obstructive sleep apnea (adult) (pediatric): Secondary | ICD-10-CM

## 2012-03-22 DIAGNOSIS — E7800 Pure hypercholesterolemia, unspecified: Secondary | ICD-10-CM

## 2012-03-22 DIAGNOSIS — L0390 Cellulitis, unspecified: Secondary | ICD-10-CM

## 2012-03-22 DIAGNOSIS — I2510 Atherosclerotic heart disease of native coronary artery without angina pectoris: Secondary | ICD-10-CM

## 2012-03-22 DIAGNOSIS — E119 Type 2 diabetes mellitus without complications: Secondary | ICD-10-CM

## 2012-03-22 LAB — HEMOGLOBIN A1C: Hgb A1c MFr Bld: 5.7 % (ref 4.6–6.5)

## 2012-03-22 LAB — URINALYSIS, ROUTINE W REFLEX MICROSCOPIC
Bilirubin Urine: NEGATIVE
Ketones, ur: NEGATIVE
Nitrite: NEGATIVE
Total Protein, Urine: NEGATIVE
pH: 6 (ref 5.0–8.0)

## 2012-03-22 LAB — COMPREHENSIVE METABOLIC PANEL
ALT: 23 U/L (ref 0–53)
AST: 22 U/L (ref 0–37)
CO2: 27 mEq/L (ref 19–32)
Chloride: 103 mEq/L (ref 96–112)
Creatinine, Ser: 1 mg/dL (ref 0.4–1.5)
Potassium: 4.4 mEq/L (ref 3.5–5.1)
Total Bilirubin: 0.4 mg/dL (ref 0.3–1.2)

## 2012-03-22 LAB — CBC WITH DIFFERENTIAL/PLATELET
Basophils Relative: 0.5 % (ref 0.0–3.0)
Eosinophils Relative: 3.7 % (ref 0.0–5.0)
MCV: 83.3 fl (ref 78.0–100.0)
Monocytes Absolute: 0.9 10*3/uL (ref 0.1–1.0)
Monocytes Relative: 13 % — ABNORMAL HIGH (ref 3.0–12.0)
Neutrophils Relative %: 63.7 % (ref 43.0–77.0)
RBC: 5.3 Mil/uL (ref 4.22–5.81)
WBC: 7.1 10*3/uL (ref 4.5–10.5)

## 2012-03-22 LAB — LIPID PANEL
Cholesterol: 168 mg/dL (ref 0–200)
Total CHOL/HDL Ratio: 4
Triglycerides: 91 mg/dL (ref 0.0–149.0)

## 2012-03-22 LAB — WOUND CULTURE

## 2012-03-22 LAB — MICROALBUMIN / CREATININE URINE RATIO
Creatinine,U: 88 mg/dL
Microalb, Ur: 1.2 mg/dL (ref 0.0–1.9)

## 2012-03-22 NOTE — Assessment & Plan Note (Signed)
I will check his a1c today to see how well controlled his DM is

## 2012-03-22 NOTE — Assessment & Plan Note (Signed)
He is doing well on lipitor, I will check his FLP today 

## 2012-03-22 NOTE — Assessment & Plan Note (Signed)
He has no s/s today but I have asked him to have a cardiology f/up

## 2012-03-22 NOTE — Assessment & Plan Note (Signed)
He does not have any s/s, I will check his PSA today

## 2012-03-22 NOTE — Assessment & Plan Note (Signed)
The sight looks a lot better, the clx looks suspicious for MRSA so I have asked him to continue bactrim

## 2012-03-22 NOTE — Assessment & Plan Note (Signed)
He has been contacted by pulm about having this evaluated

## 2012-03-22 NOTE — Progress Notes (Signed)
Subjective:    Patient ID: [name], male    DOB: 06-14-1961, 51 y.o.   MRN: [id number]  Wound Check He was originally treated 3 to 5 days ago. Previous treatment included I&D of abscess. There has been no drainage from the wound. There is no redness present. There is no swelling present. The pain has no pain. He has no difficulty moving the affected extremity or digit.  Diabetes He presents for his follow-up diabetic visit. He has type 2 diabetes mellitus. His disease course has been stable. There are no hypoglycemic associated symptoms. Pertinent negatives for hypoglycemia include no pallor. Pertinent negatives for diabetes include no blurred vision, no chest pain, no fatigue, no foot paresthesias, no foot ulcerations, no polydipsia, no polyphagia, no polyuria, no visual change, no weakness and no weight loss. There are no hypoglycemic complications. Symptoms are stable. Diabetic complications include heart disease. Current diabetic treatment includes diet. He is compliant with treatment none of the time. His weight is stable. He is following a generally unhealthy diet. When asked about meal planning, he reported none. He has not had a previous visit with a dietician. He participates in exercise intermittently. There is no change in his home blood glucose trend. An ACE inhibitor/angiotensin II receptor blocker is not being taken. He does not see a podiatrist.Eye exam is not current.      Review of Systems  Constitutional: Negative for fever, chills, weight loss, diaphoresis, activity change, appetite change, fatigue and unexpected weight change.  HENT: Negative.   Eyes: Negative.  Negative for blurred vision.  Respiratory: Negative for cough, chest tightness, shortness of breath, wheezing and stridor.   Cardiovascular: Negative for chest pain, palpitations and leg swelling.  Gastrointestinal: Negative for nausea, vomiting, abdominal pain, diarrhea, constipation and blood in stool.    Genitourinary: Negative.  Negative for polyuria.  Musculoskeletal: Negative.   Skin: Positive for wound. Negative for color change, pallor and rash.  Neurological: Negative.  Negative for weakness.  Hematological: Negative for polydipsia, polyphagia and adenopathy. Does not bruise/bleed easily.  Psychiatric/Behavioral: Negative.        Objective:   Physical Exam  Vitals reviewed. Constitutional: He is oriented to person, place, and time. He appears well-developed and well-nourished. No distress.  HENT:  Head: Normocephalic and atraumatic.  Mouth/Throat: No oropharyngeal exudate.  Eyes: Conjunctivae are normal. Right eye exhibits no discharge. Left eye exhibits no discharge. No scleral icterus.  Neck: Normal range of motion. Neck supple. No JVD present. No tracheal deviation present. No thyromegaly present.  Cardiovascular: Normal rate, regular rhythm, normal heart sounds and intact distal pulses.  Exam reveals no gallop and no friction rub.   No murmur heard. Pulmonary/Chest: Effort normal and breath sounds normal. No stridor. No respiratory distress. He has no wheezes. He has no rales. He exhibits no tenderness.  Abdominal: Soft. Bowel sounds are normal. He exhibits no distension and no mass. There is no tenderness. There is no rebound and no guarding.  Musculoskeletal: Normal range of motion. He exhibits no edema and no tenderness.  Lymphadenopathy:    He has no cervical adenopathy.  Neurological: He is oriented to person, place, and time.  Skin: Skin is warm and dry. No abrasion and no rash noted. He is not diaphoretic. No erythema. No pallor.       Left axilla was examined and the packing was removed, there is a significant decrease in the swelling and there is no ttp, erythema, induration, fluctuance, exudate, or warmth  Psychiatric: He  has a normal mood and affect. His behavior is normal. Judgment and thought content normal.      Lab Results  Component Value Date   WBC 5.3  05/02/2011   HGB 14.6 05/02/2011   HCT 42.3 05/02/2011   PLT 253.0 05/02/2011   GLUCOSE 107* 05/02/2011   CHOL 154 05/02/2011   TRIG 100.0 05/02/2011   HDL 37.50* 05/02/2011   LDLDIRECT 132.4 07/29/2008   LDLCALC 97 05/02/2011   ALT 27 05/02/2011   AST 24 05/02/2011   NA 141 05/02/2011   K 4.2 05/02/2011   CL 108 05/02/2011   CREATININE 1.0 05/02/2011   BUN 13 05/02/2011   CO2 26 05/02/2011   TSH 0.83 05/02/2011   PSA 1.92 05/02/2011   HGBA1C 6.2 05/02/2011   MICROALBUR 3.1* 12/18/2008      Assessment & Plan:

## 2012-03-22 NOTE — Patient Instructions (Signed)

## 2012-04-11 ENCOUNTER — Institutional Professional Consult (permissible substitution): Payer: Self-pay | Admitting: Pulmonary Disease

## 2012-04-12 ENCOUNTER — Institutional Professional Consult (permissible substitution): Payer: Self-pay | Admitting: Cardiovascular Disease

## 2012-05-21 ENCOUNTER — Encounter: Payer: Self-pay | Admitting: Internal Medicine

## 2012-05-21 ENCOUNTER — Other Ambulatory Visit (INDEPENDENT_AMBULATORY_CARE_PROVIDER_SITE_OTHER): Payer: BC Managed Care – PPO

## 2012-05-21 ENCOUNTER — Ambulatory Visit (INDEPENDENT_AMBULATORY_CARE_PROVIDER_SITE_OTHER): Payer: BC Managed Care – PPO | Admitting: Internal Medicine

## 2012-05-21 ENCOUNTER — Institutional Professional Consult (permissible substitution): Payer: BC Managed Care – PPO | Admitting: Cardiovascular Disease

## 2012-05-21 VITALS — BP 120/64 | HR 73 | Temp 98.2°F | Resp 16 | Wt 231.8 lb

## 2012-05-21 DIAGNOSIS — Z0000 Encounter for general adult medical examination without abnormal findings: Secondary | ICD-10-CM

## 2012-05-21 DIAGNOSIS — E119 Type 2 diabetes mellitus without complications: Secondary | ICD-10-CM

## 2012-05-21 DIAGNOSIS — Z2089 Contact with and (suspected) exposure to other communicable diseases: Secondary | ICD-10-CM

## 2012-05-21 DIAGNOSIS — Z202 Contact with and (suspected) exposure to infections with a predominantly sexual mode of transmission: Secondary | ICD-10-CM | POA: Insufficient documentation

## 2012-05-21 DIAGNOSIS — Z0001 Encounter for general adult medical examination with abnormal findings: Secondary | ICD-10-CM | POA: Insufficient documentation

## 2012-05-21 DIAGNOSIS — N401 Enlarged prostate with lower urinary tract symptoms: Secondary | ICD-10-CM

## 2012-05-21 DIAGNOSIS — N138 Other obstructive and reflux uropathy: Secondary | ICD-10-CM

## 2012-05-21 DIAGNOSIS — I2510 Atherosclerotic heart disease of native coronary artery without angina pectoris: Secondary | ICD-10-CM

## 2012-05-21 DIAGNOSIS — R079 Chest pain, unspecified: Secondary | ICD-10-CM

## 2012-05-21 LAB — HEMOGLOBIN A1C: Hgb A1c MFr Bld: 5.7 % (ref 4.6–6.5)

## 2012-05-21 LAB — BASIC METABOLIC PANEL
BUN: 15 mg/dL (ref 6–23)
GFR: 93.5 mL/min (ref >60.00)
Potassium: 4.3 mEq/L (ref 3.5–5.1)
Sodium: 140 mEq/L (ref 135–145)

## 2012-05-21 LAB — PSA: PSA: 2.25 ng/mL (ref 0.10–4.00)

## 2012-05-21 NOTE — Assessment & Plan Note (Signed)
I will recheck his a1c today 

## 2012-05-21 NOTE — Patient Instructions (Signed)
Health Maintenance, Males A healthy lifestyle and preventative care can promote health and wellness.  Maintain regular health, dental, and eye exams.   Eat a healthy diet. Foods like vegetables, fruits, whole grains, low-fat dairy products, and lean protein foods contain the nutrients you need without too many calories. Decrease your intake of foods high in solid fats, added sugars, and salt. Get information about a proper diet from your caregiver, if necessary.   Regular physical exercise is one of the most important things you can do for your health. Most adults should get at least 150 minutes of moderate-intensity exercise (any activity that increases your heart rate and causes you to sweat) each week. In addition, most adults need muscle-strengthening exercises on 2 or more days a week.    Maintain a healthy weight. The body mass index (BMI) is a screening tool to identify possible weight problems. It provides an estimate of body fat based on height and weight. Your caregiver can help determine your BMI, and can help you achieve or maintain a healthy weight. For adults 20 years and older:   A BMI below 18.5 is considered underweight.   A BMI of 18.5 to 24.9 is normal.   A BMI of 25 to 29.9 is considered overweight.   A BMI of 30 and above is considered obese.   Maintain normal blood lipids and cholesterol by exercising and minimizing your intake of saturated fat. Eat a balanced diet with plenty of fruits and vegetables. Blood tests for lipids and cholesterol should begin at age 20 and be repeated every 5 years. If your lipid or cholesterol levels are high, you are over 50, or you are a high risk for heart disease, you may need your cholesterol levels checked more frequently.Ongoing high lipid and cholesterol levels should be treated with medicines, if diet and exercise are not effective.   If you smoke, find out from your caregiver how to quit. If you do not use tobacco, do not start.    If you choose to drink alcohol, do not exceed 2 drinks per day. One drink is considered to be 12 ounces (355 mL) of beer, 5 ounces (148 mL) of wine, or 1.5 ounces (44 mL) of liquor.   Avoid use of street drugs. Do not share needles with anyone. Ask for help if you need support or instructions about stopping the use of drugs.   High blood pressure causes heart disease and increases the risk of stroke. Blood pressure should be checked at least every 1 to 2 years. Ongoing high blood pressure should be treated with medicines if weight loss and exercise are not effective.   If you are 45 to 49 years old, ask your caregiver if you should take aspirin to prevent heart disease.   Diabetes screening involves taking a blood sample to check your fasting blood sugar level. This should be done once every 3 years, after age 45, if you are within normal weight and without risk factors for diabetes. Testing should be considered at a younger age or be carried out more frequently if you are overweight and have at least 1 risk factor for diabetes.   Colorectal cancer can be detected and often prevented. Most routine colorectal cancer screening begins at the age of 50 and continues through age 75. However, your caregiver may recommend screening at an earlier age if you have risk factors for colon cancer. On a yearly basis, your caregiver may provide home test kits to check for hidden   blood in the stool. Use of a small camera at the end of a tube, to directly examine the colon (sigmoidoscopy or colonoscopy), can detect the earliest forms of colorectal cancer. Talk to your caregiver about this at age 50, when routine screening begins. Direct examination of the colon should be repeated every 5 to 10 years through age 75, unless early forms of pre-cancerous polyps or small growths are found.   Hepatitis C blood testing is recommended for all people born from 1945 through 1965 and any individual with known risks for  hepatitis C.   Healthy men should no longer receive prostate-specific antigen (PSA) blood tests as part of routine cancer screening. Consult with your caregiver about prostate cancer screening.   Testicular cancer screening is not recommended for adolescents or adult males who have no symptoms. Screening includes self-exam, caregiver exam, and other screening tests. Consult with your caregiver about any symptoms you have or any concerns you have about testicular cancer.   Practice safe sex. Use condoms and avoid high-risk sexual practices to reduce the spread of sexually transmitted infections (STIs).   Use sunscreen with a sun protection factor (SPF) of 30 or greater. Apply sunscreen liberally and repeatedly throughout the day. You should seek shade when your shadow is shorter than you. Protect yourself by wearing long sleeves, pants, a wide-brimmed hat, and sunglasses year round, whenever you are outdoors.   Notify your caregiver of new moles or changes in moles, especially if there is a change in shape or color. Also notify your caregiver if a mole is larger than the size of a pencil eraser.   A one-time screening for abdominal aortic aneurysm (AAA) and surgical repair of large AAAs by sound wave imaging (ultrasonography) is recommended for ages 65 to 75 years who are current or former smokers.   Stay current with your immunizations.  Document Released: 05/12/2008 Document Revised: 11/03/2011 Document Reviewed: 04/11/2011 [employer] Patient Information 2012 [employer]. 

## 2012-05-21 NOTE — Assessment & Plan Note (Signed)
His EKG is normal and the pain is atypical though he does have a hx of CAD, he will f/up with cardiology soon

## 2012-05-21 NOTE — Assessment & Plan Note (Signed)
At this request I will check for STD's today

## 2012-05-21 NOTE — Assessment & Plan Note (Signed)
Exam done, labs ordered, vaccines were reviewed, pt ed material was given 

## 2012-05-21 NOTE — Assessment & Plan Note (Signed)
His PSA was slightly elevated last time so I will recheck it today and if it continues to rise I will send him to urology

## 2012-05-21 NOTE — Progress Notes (Signed)
Subjective:    Patient ID: [name], male    DOB: Jul 31, 1961, 51 y.o.   MRN: [id number]  Chest Pain  This is a recurrent problem. The current episode started more than 1 month ago. The onset quality is gradual. The problem occurs intermittently. The problem has been unchanged. The pain is present in the substernal region. The pain is at a severity of 1/10. The pain is mild. The quality of the pain is described as dull. The pain does not radiate. Pertinent negatives include no abdominal pain, back pain, claudication, cough, diaphoresis, dizziness, exertional chest pressure, fever, headaches, hemoptysis, irregular heartbeat, leg pain, lower extremity edema, malaise/fatigue, nausea, near-syncope, numbness, orthopnea, palpitations, PND, shortness of breath, sputum production, syncope, vomiting or weakness. The pain is aggravated by nothing. He has tried nothing for the symptoms.  His past medical history is significant for CAD.  Pertinent negatives for past medical history include no seizures.      Review of Systems  Constitutional: Negative for fever, chills, malaise/fatigue, diaphoresis, activity change, appetite change, fatigue and unexpected weight change.  HENT: Negative.   Eyes: Negative.   Respiratory: Negative for apnea, cough, hemoptysis, sputum production, choking, chest tightness, shortness of breath, wheezing and stridor.   Cardiovascular: Positive for chest pain. Negative for palpitations, orthopnea, claudication, leg swelling, syncope, PND and near-syncope.  Gastrointestinal: Negative for nausea, vomiting, abdominal pain, diarrhea, constipation, blood in stool and abdominal distention.  Genitourinary: Negative for dysuria, urgency, frequency, hematuria, flank pain, decreased urine volume, discharge, penile swelling, scrotal swelling, enuresis, difficulty urinating, genital sores, penile pain and testicular pain.  Musculoskeletal: Negative for myalgias, back pain, joint swelling,  arthralgias and gait problem.  Skin: Negative for color change, pallor, rash and wound.  Neurological: Negative for dizziness, tremors, seizures, syncope, facial asymmetry, speech difficulty, weakness, light-headedness, numbness and headaches.  Hematological: Negative for adenopathy. Does not bruise/bleed easily.  Psychiatric/Behavioral: Negative.        Objective:   Physical Exam  Vitals reviewed. Constitutional: He is oriented to person, place, and time. He appears well-developed and well-nourished. No distress.  HENT:  Head: Normocephalic and atraumatic.  Mouth/Throat: Oropharynx is clear and moist. No oropharyngeal exudate.  Eyes: Conjunctivae are normal. Right eye exhibits no discharge. Left eye exhibits no discharge. No scleral icterus.  Neck: Normal range of motion. Neck supple. No JVD present. No tracheal deviation present. No thyromegaly present.  Cardiovascular: Normal rate, regular rhythm, normal heart sounds and intact distal pulses.  Exam reveals no gallop and no friction rub.   No murmur heard. Pulmonary/Chest: Effort normal and breath sounds normal. No stridor. No respiratory distress. He has no wheezes. He has no rales. He exhibits no tenderness.  Abdominal: Soft. Bowel sounds are normal. He exhibits no distension and no mass. There is no tenderness. There is no rebound and no guarding. Hernia confirmed negative in the right inguinal area and confirmed negative in the left inguinal area.  Genitourinary: Rectum normal, prostate normal, testes normal and penis normal. Rectal exam shows no external hemorrhoid, no internal hemorrhoid, no fissure, no mass, no tenderness and anal tone normal. Guaiac negative stool. Prostate is not enlarged and not tender. Right testis shows no mass, no swelling and no tenderness. Right testis is descended. Left testis shows no mass, no swelling and no tenderness. Left testis is descended. Circumcised. No penile tenderness. No discharge found.    Musculoskeletal: Normal range of motion. He exhibits no edema and no tenderness.  Lymphadenopathy:    He has no  cervical adenopathy.       Right: No inguinal adenopathy present.       Left: No inguinal adenopathy present.  Neurological: He is oriented to person, place, and time.  Skin: Skin is warm and dry. No rash noted. He is not diaphoretic. No erythema. No pallor.  Psychiatric: He has a normal mood and affect. His behavior is normal. Judgment and thought content normal.     Lab Results  Component Value Date   WBC 7.1 03/22/2012   HGB 14.7 03/22/2012   HCT 44.1 03/22/2012   PLT 248.0 03/22/2012   GLUCOSE 72 03/22/2012   CHOL 168 03/22/2012   TRIG 91.0 03/22/2012   HDL 42.90 03/22/2012   LDLDIRECT 132.4 07/29/2008   LDLCALC 107* 03/22/2012   ALT 23 03/22/2012   AST 22 03/22/2012   NA 139 03/22/2012   K 4.4 03/22/2012   CL 103 03/22/2012   CREATININE 1.0 03/22/2012   BUN 18 03/22/2012   CO2 27 03/22/2012   TSH 1.17 03/22/2012   PSA 2.63 03/22/2012   HGBA1C 5.7 03/22/2012   MICROALBUR 1.2 03/22/2012       Assessment & Plan:

## 2012-05-22 ENCOUNTER — Encounter: Payer: Self-pay | Admitting: Internal Medicine

## 2012-05-22 LAB — GC PROBE AMPLIFICATION, URINE: GC Probe Amp, Urine: NEGATIVE

## 2012-06-27 ENCOUNTER — Telehealth: Payer: Self-pay | Admitting: Internal Medicine

## 2012-06-27 MED ORDER — SULFAMETHOXAZOLE-TRIMETHOPRIM 800-160 MG PO TABS: 1.0000 | ORAL_TABLET | Freq: Two times a day (BID) | ORAL | Status: AC

## 2012-06-27 NOTE — Telephone Encounter (Signed)
done

## 2012-06-27 NOTE — Telephone Encounter (Signed)
Pt is req antibiotic for boils. Please send in to [facility] on [address]. Thank you.

## 2012-10-27 ENCOUNTER — Emergency Department (HOSPITAL_COMMUNITY): Payer: BC Managed Care – PPO

## 2012-10-27 ENCOUNTER — Emergency Department (HOSPITAL_COMMUNITY)
Admission: EM | Admit: 2012-10-27 | Discharge: 2012-10-27 | Disposition: A | Discharge status: Home / Self Care | Payer: BC Managed Care – PPO | Attending: Emergency Medicine | Admitting: Emergency Medicine

## 2012-10-27 DIAGNOSIS — G4733 Obstructive sleep apnea (adult) (pediatric): Secondary | ICD-10-CM | POA: Insufficient documentation

## 2012-10-27 DIAGNOSIS — E7800 Pure hypercholesterolemia, unspecified: Secondary | ICD-10-CM | POA: Insufficient documentation

## 2012-10-27 DIAGNOSIS — E119 Type 2 diabetes mellitus without complications: Secondary | ICD-10-CM | POA: Insufficient documentation

## 2012-10-27 DIAGNOSIS — Z23 Encounter for immunization: Secondary | ICD-10-CM | POA: Insufficient documentation

## 2012-10-27 DIAGNOSIS — I2510 Atherosclerotic heart disease of native coronary artery without angina pectoris: Secondary | ICD-10-CM | POA: Insufficient documentation

## 2012-10-27 DIAGNOSIS — Z7982 Long term (current) use of aspirin: Secondary | ICD-10-CM | POA: Insufficient documentation

## 2012-10-27 DIAGNOSIS — Y9389 Activity, other specified: Secondary | ICD-10-CM | POA: Insufficient documentation

## 2012-10-27 DIAGNOSIS — W230XXA Caught, crushed, jammed, or pinched between moving objects, initial encounter: Secondary | ICD-10-CM | POA: Insufficient documentation

## 2012-10-27 DIAGNOSIS — F17200 Nicotine dependence, unspecified, uncomplicated: Secondary | ICD-10-CM | POA: Insufficient documentation

## 2012-10-27 DIAGNOSIS — Z7251 High risk heterosexual behavior: Secondary | ICD-10-CM | POA: Insufficient documentation

## 2012-10-27 DIAGNOSIS — Z87448 Personal history of other diseases of urinary system: Secondary | ICD-10-CM | POA: Insufficient documentation

## 2012-10-27 DIAGNOSIS — IMO0002 Reserved for concepts with insufficient information to code with codable children: Secondary | ICD-10-CM

## 2012-10-27 DIAGNOSIS — Y929 Unspecified place or not applicable: Secondary | ICD-10-CM | POA: Insufficient documentation

## 2012-10-27 DIAGNOSIS — S61209A Unspecified open wound of unspecified finger without damage to nail, initial encounter: Secondary | ICD-10-CM | POA: Insufficient documentation

## 2012-10-27 DIAGNOSIS — E8881 Metabolic syndrome: Secondary | ICD-10-CM | POA: Insufficient documentation

## 2012-10-27 DIAGNOSIS — Z872 Personal history of diseases of the skin and subcutaneous tissue: Secondary | ICD-10-CM | POA: Insufficient documentation

## 2012-10-27 DIAGNOSIS — J310 Chronic rhinitis: Secondary | ICD-10-CM | POA: Insufficient documentation

## 2012-10-27 MED ORDER — HYDROCODONE-ACETAMINOPHEN 5-325 MG PO TABS: 1.0000 | ORAL_TABLET | Freq: Four times a day (QID) | ORAL | Status: DC | PRN

## 2012-10-27 MED ORDER — TETANUS-DIPHTH-ACELL PERTUSSIS 5-2.5-18.5 LF-MCG/0.5 IM SUSP
0.5000 mL | Freq: Once | INTRAMUSCULAR | Status: AC
  Administered 2012-10-27: 0.5 mL via INTRAMUSCULAR

## 2012-10-27 MED ORDER — TETANUS-DIPHTH-ACELL PERTUSSIS 5-2.5-18.5 LF-MCG/0.5 IM SUSP
INTRAMUSCULAR | Status: AC
  Filled 2012-10-27: qty 0.5

## 2012-10-27 MED ORDER — HYDROCODONE-ACETAMINOPHEN 5-325 MG PO TABS
1.0000 | ORAL_TABLET | Freq: Once | ORAL | Status: AC
  Administered 2012-10-27: 1 via ORAL
  Filled 2012-10-27: qty 1

## 2012-10-27 NOTE — ED Provider Notes (Signed)
[name] is a 51 y.o. male who cut his right, middle finger, when he dropped a weight on it, today.  Exam. Right middle finger with laceration of the proximal radial aspect. He has near normal flexion of the PIP and IP joints. He is neurovascularly intact distally in the right middle finger.  Assessment: Laceration ring finger without tendon, nerve or artery injury. He can be sutured and followed up expectantly.   Medical screening examination/treatment/procedure(s) were conducted as a shared visit with non-physician practitioner(s) and myself.  I personally evaluated the patient during the encounter  Flint Melter, MD 10/27/12 [id number]

## 2012-10-27 NOTE — ED Notes (Signed)
Patient was lifting weights today and caught his hand between two of them lacerating base of third right finger.  Partially an avulsion.  Tetanus up to date.

## 2012-10-27 NOTE — ED Provider Notes (Signed)
History     CSN: [id number]  Arrival date & time 10/27/12  1650   First MD Initiated Contact with Patient 10/27/12 1728      Chief Complaint  Patient presents with  . Laceration    (Consider location/radiation/quality/duration/timing/severity/associated sxs/prior treatment) Patient is a 51 y.o. male presenting with skin laceration. The history is provided by the patient.  Laceration  The incident occurred 1 to 2 hours ago. The laceration is located on the right hand. The laceration is 3 cm in size. Injury mechanism: busted between floor and weight. The pain is at a severity of 10/10. The pain is moderate. The pain has been constant since onset. He reports no foreign bodies present. His tetanus status is out of date.    Past Medical History  Diagnosis Date  . Coronary atherosclerosis of unspecified type of vessel, native or graft   . Pure hypercholesterolemia   . Tobacco use disorder   . Type II or unspecified type diabetes mellitus without mention of complication, not stated as uncontrolled   . Obstructive sleep apnea (adult) (pediatric)   . Problems related to high-risk sexual behavior   . Cellulitis and abscess of unspecified site   . Dysmetabolic syndrome X   . Erectile dysfunction   . Chronic rhinitis   . Hypertrophy of prostate with urinary obstruction and other lower urinary tract symptoms (LUTS)   . Family history of malignant neoplasm of gastrointestinal tract     Past Surgical History  Procedure Date  . Ptca   . Incision and drainage abscess / hematoma of bursa / knee / thigh     I&D of complex abscess,left axilla. I&D of large infected pilonidal abscess  . Cosmetic surgery     Family History  Problem Relation Age of Onset  . Cancer Mother     breast  . Cancer Maternal Grandmother     colon cancer  . Heart disease Maternal Grandfather   . Cancer Other     colon cancer 1st degree relative<60  . Heart disease Other     History  Substance Use Topics  .  Smoking status: Current Every Day Smoker -- 1.0 packs/day for 25 years  . Smokeless tobacco: Not on file  . Alcohol Use: 3.0 oz/week    5 Cans of beer per week      Review of Systems  Constitutional: Negative for fever, diaphoresis and activity change.  HENT: Negative for congestion and neck pain.   Respiratory: Negative for cough.   Genitourinary: Negative for dysuria.  Musculoskeletal: Negative for myalgias.  Skin: Positive for wound. Negative for color change.  Neurological: Negative for headaches.  All other systems reviewed and are negative.    Allergies  Penicillins  Home Medications   Current Outpatient Rx  Name  Route  Sig  Dispense  Refill  . ASPIRIN EC 81 MG PO TBEC   Oral   Take 81 mg by mouth daily.         . ADULT MULTIVITAMIN W/MINERALS CH   Oral   Take 1 tablet by mouth daily.           BP 143/78  Pulse 88  Temp 98.8 F (37.1 C) (Oral)  Resp 20  Wt 215 lb (97.523 kg)  SpO2 100%  Physical Exam  Nursing note and vitals reviewed. Constitutional: He appears well-developed and well-nourished. No distress.  HENT:  Head: Normocephalic and atraumatic.  Eyes: Conjunctivae normal and EOM are normal.  Neck: Normal range of motion.  Neck supple.  Cardiovascular:       Intact distal pulses, capillary refill < 3 seconds  Musculoskeletal:       Right hand third digit: Complete extension, near complete flexion at PIP/DIP   Neurological:       No sensory deficit  Skin: He is not diaphoretic.       3 cm uneven laceration located at the right proximal radial aspect 3rd digit right hand, actively bleeding    ED Course  LACERATION REPAIR Date/Time: 10/27/2012 8:20 PM Performed by: Jaci Carrel Authorized by: Jaci Carrel Consent: Verbal consent obtained. Risks and benefits: risks, benefits and alternatives were discussed Consent given by: patient Patient understanding: patient states understanding of the procedure being performed Patient consent:  the patient's understanding of the procedure matches consent given Patient identity confirmed: verbally with patient and arm band Body area: upper extremity Location details: right long finger Laceration length: 2.5 cm Foreign bodies: no foreign bodies Tendon involvement: thoroughly explored, soed not appear to be involved. Nerve involvement: none Vascular damage: no Anesthesia: local infiltration Local anesthetic: lidocaine 2% without epinephrine Anesthetic total: 3 ml Patient sedated: no Preparation: Patient was prepped and draped in the usual sterile fashion. Irrigation solution: saline Irrigation method: syringe Amount of cleaning: extensive Debridement: moderate Degree of undermining: minimal Skin closure: 4-0 Prolene Number of sutures: 8 Technique: simple Approximation: close Approximation difficulty: complex Dressing: antibiotic ointment Patient tolerance: Patient tolerated the procedure well with no immediate complications.   (including critical care time)  Labs Reviewed - No data to display Dg Finger Middle Right  10/27/2012  *RADIOLOGY REPORT*  Clinical Data: Injury.  Laceration  RIGHT MIDDLE FINGER 2+V  Comparison: None.  Findings: Negative for fracture or foreign body.  No significant arthropathy.  Laceration with gas in the soft tissues of the third finger.  IMPRESSION: Soft tissue laceration.  No bony abnormality.   Original Report Authenticated By: Janeece Riggers, M.D.      No diagnosis found.    MDM  50 yo M w laceration to the right proximal radial aspect 3rd digit right hand. Tdap booster given. Pressure irrigation performed. Wound explored with no obvious tendon involvement. On exam mildly incomplete flexion of digit at PIP/DIP- will refer to hand for follow up.  Laceration occurred just prior to repair which was well tolerated. Pt is diabetic and explained this may effect normal wound healing and advised to closely watch BG and follow up for wound check in  5-7 days with suture removal in 7-10 days. Discussed suture home care w pt and answered questions.Pt is hemodynamically stable w no complaints prior to dc.          Jaci Carrel, PA-C 10/27/12 2023  Jaci Carrel, PA-C 10/27/12 2029

## 2012-10-29 ENCOUNTER — Encounter: Payer: Self-pay | Admitting: Internal Medicine

## 2012-10-29 ENCOUNTER — Ambulatory Visit (INDEPENDENT_AMBULATORY_CARE_PROVIDER_SITE_OTHER): Payer: BC Managed Care – PPO | Admitting: Internal Medicine

## 2012-10-29 ENCOUNTER — Emergency Department (HOSPITAL_COMMUNITY)
Admission: EM | Admit: 2012-10-29 | Discharge: 2012-10-29 | Disposition: A | Discharge status: Home / Self Care | Payer: BC Managed Care – PPO | Attending: Emergency Medicine | Admitting: Emergency Medicine

## 2012-10-29 ENCOUNTER — Telehealth: Payer: Self-pay | Admitting: Internal Medicine

## 2012-10-29 ENCOUNTER — Emergency Department (HOSPITAL_COMMUNITY): Payer: BC Managed Care – PPO

## 2012-10-29 VITALS — BP 110/72 | HR 72 | Temp 98.7°F | Resp 16 | Wt 220.0 lb

## 2012-10-29 DIAGNOSIS — Z79899 Other long term (current) drug therapy: Secondary | ICD-10-CM | POA: Insufficient documentation

## 2012-10-29 DIAGNOSIS — Y838 Other surgical procedures as the cause of abnormal reaction of the patient, or of later complication, without mention of misadventure at the time of the procedure: Secondary | ICD-10-CM | POA: Insufficient documentation

## 2012-10-29 DIAGNOSIS — I2510 Atherosclerotic heart disease of native coronary artery without angina pectoris: Secondary | ICD-10-CM | POA: Insufficient documentation

## 2012-10-29 DIAGNOSIS — G4733 Obstructive sleep apnea (adult) (pediatric): Secondary | ICD-10-CM | POA: Insufficient documentation

## 2012-10-29 DIAGNOSIS — N400 Enlarged prostate without lower urinary tract symptoms: Secondary | ICD-10-CM | POA: Insufficient documentation

## 2012-10-29 DIAGNOSIS — F17200 Nicotine dependence, unspecified, uncomplicated: Secondary | ICD-10-CM | POA: Insufficient documentation

## 2012-10-29 DIAGNOSIS — Z87448 Personal history of other diseases of urinary system: Secondary | ICD-10-CM | POA: Insufficient documentation

## 2012-10-29 DIAGNOSIS — S6710XA Crushing injury of unspecified finger(s), initial encounter: Secondary | ICD-10-CM

## 2012-10-29 DIAGNOSIS — M7989 Other specified soft tissue disorders: Secondary | ICD-10-CM | POA: Insufficient documentation

## 2012-10-29 DIAGNOSIS — Z7982 Long term (current) use of aspirin: Secondary | ICD-10-CM | POA: Insufficient documentation

## 2012-10-29 DIAGNOSIS — Z8709 Personal history of other diseases of the respiratory system: Secondary | ICD-10-CM | POA: Insufficient documentation

## 2012-10-29 DIAGNOSIS — T8140XA Infection following a procedure, unspecified, initial encounter: Secondary | ICD-10-CM | POA: Insufficient documentation

## 2012-10-29 DIAGNOSIS — E7800 Pure hypercholesterolemia, unspecified: Secondary | ICD-10-CM | POA: Insufficient documentation

## 2012-10-29 DIAGNOSIS — E119 Type 2 diabetes mellitus without complications: Secondary | ICD-10-CM | POA: Insufficient documentation

## 2012-10-29 LAB — CBC WITH DIFFERENTIAL/PLATELET
Basophils Relative: 1 % (ref 0–1)
Eosinophils Absolute: 0.3 10*3/uL (ref 0.0–0.7)
Hemoglobin: 14.7 g/dL (ref 13.0–17.0)
Lymphs Abs: 1.8 10*3/uL (ref 0.7–4.0)
Monocytes Relative: 10 % (ref 3–12)
Neutro Abs: 4.2 10*3/uL (ref 1.7–7.7)
Neutrophils Relative %: 60 % (ref 43–77)
Platelets: 276 10*3/uL (ref 150–400)
RBC: 5.2 MIL/uL (ref 4.22–5.81)

## 2012-10-29 MED ORDER — HYDROMORPHONE HCL PF 1 MG/ML IJ SOLN
0.5000 mg | Freq: Once | INTRAMUSCULAR | Status: AC
  Administered 2012-10-29: 0.5 mg via INTRAVENOUS
  Filled 2012-10-29: qty 1

## 2012-10-29 MED ORDER — CLINDAMYCIN PHOSPHATE 600 MG/50ML IV SOLN
600.0000 mg | Freq: Once | INTRAVENOUS | Status: AC
  Administered 2012-10-29: 600 mg via INTRAVENOUS
  Filled 2012-10-29: qty 50

## 2012-10-29 MED ORDER — OXYCODONE-ACETAMINOPHEN 5-325 MG PO TABS: 1.0000 | ORAL_TABLET | Freq: Four times a day (QID) | ORAL | Status: DC | PRN

## 2012-10-29 MED ORDER — CLINDAMYCIN HCL 150 MG PO CAPS: 450.0000 mg | ORAL_CAPSULE | Freq: Three times a day (TID) | ORAL | Status: DC

## 2012-10-29 MED ORDER — VANCOMYCIN HCL IN DEXTROSE 1-5 GM/200ML-% IV SOLN
1000.0000 mg | Freq: Once | INTRAVENOUS | Status: AC
  Administered 2012-10-29: 1000 mg via INTRAVENOUS
  Filled 2012-10-29: qty 200

## 2012-10-29 NOTE — ED Provider Notes (Signed)
History     CSN: [id number]  Arrival date & time 10/29/12  1646   First MD Initiated Contact with Patient 10/29/12 1745      Chief Complaint  Patient presents with  . Finger Injury    (Consider location/radiation/quality/duration/timing/severity/associated sxs/prior treatment) The history is provided by the patient.  patient had a crush injury to his right middle finger on Saturday. It is reportedly now more painful and cold numb. He saw his primary care doctor who sent him in to see a hand surgeon. No fevers. No trauma. He states his pain moving his finger. He has increased swelling in his hand. No clear drainage from wound.  Past Medical History  Diagnosis Date  . Coronary atherosclerosis of unspecified type of vessel, native or graft   . Pure hypercholesterolemia   . Tobacco use disorder   . Type II or unspecified type diabetes mellitus without mention of complication, not stated as uncontrolled   . Obstructive sleep apnea (adult) (pediatric)   . Problems related to high-risk sexual behavior   . Cellulitis and abscess of unspecified site   . Dysmetabolic syndrome X   . Erectile dysfunction   . Chronic rhinitis   . Hypertrophy of prostate with urinary obstruction and other lower urinary tract symptoms (LUTS)   . Family history of malignant neoplasm of gastrointestinal tract     Past Surgical History  Procedure Date  . Ptca   . Incision and drainage abscess / hematoma of bursa / knee / thigh     I&D of complex abscess,left axilla. I&D of large infected pilonidal abscess  . Cosmetic surgery     Family History  Problem Relation Age of Onset  . Cancer Mother     breast  . Cancer Maternal Grandmother     colon cancer  . Heart disease Maternal Grandfather   . Cancer Other     colon cancer 1st degree relative<60  . Heart disease Other     History  Substance Use Topics  . Smoking status: Current Every Day Smoker -- 1.0 packs/day for 25 years  . Smokeless tobacco:  Not on file  . Alcohol Use: 3.0 oz/week    5 Cans of beer per week      Review of Systems  Constitutional: Negative for fever and fatigue.  Musculoskeletal: Positive for joint swelling.  Skin: Positive for color change and wound.    Allergies  Penicillins  Home Medications   Current Outpatient Rx  Name  Route  Sig  Dispense  Refill  . ASPIRIN EC 81 MG PO TBEC   Oral   Take 81 mg by mouth daily.         Marland Kitchen HYDROCODONE-ACETAMINOPHEN 5-325 MG PO TABS   Oral   Take 1 tablet by mouth every 6 (six) hours as needed for pain.   15 tablet   0   . ADULT MULTIVITAMIN W/MINERALS CH   Oral   Take 1 tablet by mouth daily.         Marland Kitchen CLINDAMYCIN HCL 150 MG PO CAPS   Oral   Take 3 capsules (450 mg total) by mouth 3 (three) times daily.   45 capsule   0   . OXYCODONE-ACETAMINOPHEN 5-325 MG PO TABS   Oral   Take 1-2 tablets by mouth every 6 (six) hours as needed for pain.   20 tablet   0     BP 116/72  Pulse 63  Temp 98.9 F (37.2 C) (Oral)  Resp  18  SpO2 100%  Physical Exam  Vitals reviewed. Constitutional: He appears well-developed.  Musculoskeletal:       Sutured wound to proximal phalanx of right middle finger. Some delayed capillary refill and decreased sensation laterally. His decreased range of motion at PIP and PIP joints. His finger is swollen as is his hand. No drainage from wound.  Skin: There is erythema.    ED Course  Procedures (including critical care time)   Labs Reviewed  CBC WITH DIFFERENTIAL   Dg Finger Middle Right  10/29/2012  *RADIOLOGY REPORT*  Clinical Data: Diffuse right middle finger pain and swelling. Laceration of that finger on 10/27/2012.  RIGHT MIDDLE FINGER 2+V  Comparison: 10/27/2012.  Findings: The previously seen soft tissue air in the proximal middle finger is no longer demonstrated.  No fracture, dislocation or radiopaque foreign body seen.  IMPRESSION: No fracture, radiopaque foreign body or soft tissue gas.   Original  Report Authenticated By: Beckie Salts, M.D.      1. Post-operative infection       MDM  Patient with likely infection after wound was closed in ER a few days ago. Seen in ER by Dr. Mina Marble. Antibiotics were given and patient will follow in the office on Thursday.        Juliet Rude. Rubin Payor, MD 10/30/12 2956

## 2012-10-29 NOTE — Patient Instructions (Signed)
Wound Care  Wound care helps prevent pain and infection.    You may need a tetanus shot if:   You cannot remember when you had your last tetanus shot.   You have never had a tetanus shot.   The injury broke your skin.  If you need a tetanus shot and you choose not to have one, you may get tetanus. Sickness from tetanus can be serious.  HOME CARE     Only take medicine as told by your doctor.   Clean the wound daily with mild soap and water.   Change any bandages (dressings) as told by your doctor.   Put medicated cream and a bandage on the wound as told by your doctor.   Change the bandage if it gets wet, dirty, or starts to smell.   Take showers. Do not take baths, swim, or do anything that puts your wound under water.   Rest and raise (elevate) the wound until the pain and puffiness (swelling) are better.   Keep all doctor visits as told.  GET HELP RIGHT AWAY IF:     Yellowish-white fluid (pus) comes from the wound.   Medicine does not lessen your pain.   There is a red streak going away from the wound.   You have a fever.  MAKE SURE YOU:     Understand these instructions.   Will watch your condition.   Will get help right away if you are not doing well or get worse.  Document Released: 08/23/2008 Document Revised: 02/06/2012 Document Reviewed: 03/20/2011  [employer] Patient Information 2013 [employer].

## 2012-10-29 NOTE — ED Notes (Signed)
Bed:WA14<BR> Expected date:<BR> Expected time:<BR> Means of arrival:<BR> Comments:<BR> Hold for triage 1

## 2012-10-29 NOTE — Telephone Encounter (Signed)
Patient Information:  Caller Name: Giulian  Phone: [phone]  Patient: [name]  Gender: Male  DOB: Sep 16, 1961  Age: 51 Years  PCP: Sanda Linger (Adults only)   Symptoms  Reason For Call & Symptoms: Middle finger to Right Hand injury with pain and swelling with stitches  Reviewed Health History In EMR: Yes  Reviewed Medications In EMR: Yes  Reviewed Allergies In EMR: Yes  Reviewed Surgeries / Procedures: Yes  Date of Onset of Symptoms: 10/27/2012  Treatments Tried: Taking Norco for pain,  Treatments Tried Worked: No  Guideline(s) Used:  Finger Injury  Disposition Per Guideline:   Go to ED Now (or to Office with PCP Approval)  Reason For Disposition Reached:   Cut with numbness (loss of sensation) of finger  Advice Given:  N/A  Office Follow Up:  Does the office need to follow up with this patient?: Yes  Instructions For The Office: Patient Disposition see in Emergency Room or Office with PCP approval.  Patient has appointment previously scheduled at 16:00.  Please follow up with patient with work in appointment or further instructions.  See Nurses note for update on status of injury of finger.  RN Note:  Patient injured finger on Saturday at [facility] and had to go to Emergency Room for 8-10 stitches to inside of middle finger.  States today pain is 5/10 however hurts worse if he is lying down.  Finger is numb and cold to top area with discoloration to lower part. Has some tingling sensation. Increased in swelling. Patient has appointment at 1600 and wants to know if he can come in sooner. Per Epic schedule no ealier appointments available.  Message sent to determine if earlier appointment available or if patient should go back to Emergency Department for evaluation.

## 2012-10-29 NOTE — ED Notes (Signed)
Pt states his is unable to bend finger.

## 2012-10-29 NOTE — Assessment & Plan Note (Signed)
I am concerned about ischemia and infection, I think he should see a hand surgery, he will return to the ER for further evaluation

## 2012-10-29 NOTE — ED Notes (Signed)
Pt c/o worsening pain and swelling to 3rd finger on R hand. Pt injured finger on Sat and has sutures to side of finger. Pt states finger is now cold and numb since leaving ED on Saturday. Pt went to his doctor today and was told to come to ED. Pt has slow capillary refill to finger. Finger is cool to touch. Pt states swelling has increased to R hand and finger is very painful.

## 2012-10-29 NOTE — ED Notes (Signed)
Pt. Standing in doorway.

## 2012-10-29 NOTE — Progress Notes (Signed)
  Subjective:    Patient ID: [name], male    DOB: [birth year], 51 y.o.   MRN: [id number]  Wound Check He was originally treated 2 to 3 days ago. Previous treatment included laceration repair. His temperature was unmeasured prior to arrival. There has been no drainage from the wound. There is new redness present. There is new swelling present. The pain has new pain. He has no difficulty moving (right middle finger feels cold and numb to him) the affected extremity or digit.      Review of Systems  Constitutional: Negative.   HENT: Negative.   Eyes: Negative.   Respiratory: Negative.   Cardiovascular: Negative.   Gastrointestinal: Negative.   Genitourinary: Negative.   Musculoskeletal: Negative.   Skin: Negative.   Neurological: Negative.   Hematological: Negative.   Psychiatric/Behavioral: Negative.        Objective:   Physical Exam  Musculoskeletal:       Right middle finger has a repaired laceration over the proximal phalanx, radial side - proximalto this there is a subtle area of erythema/warmth/swelling. Distally in the RMF there is no capillary refill, the finger feels cool, pulse ox does not measure anything (left middle finger pulse ox = 97%)      Dg Finger Middle Right  10/27/2012  *RADIOLOGY REPORT*  Clinical Data: Injury.  Laceration  RIGHT MIDDLE FINGER 2+V  Comparison: None.  Findings: Negative for fracture or foreign body.  No significant arthropathy.  Laceration with gas in the soft tissues of the third finger.  IMPRESSION: Soft tissue laceration.  No bony abnormality.   Original Report Authenticated By: Janeece Riggers, M.D.      Assessment & Plan:

## 2013-06-07 ENCOUNTER — Ambulatory Visit (INDEPENDENT_AMBULATORY_CARE_PROVIDER_SITE_OTHER): Payer: BC Managed Care – PPO | Admitting: Internal Medicine

## 2013-06-07 ENCOUNTER — Encounter: Payer: Self-pay | Admitting: Internal Medicine

## 2013-06-07 VITALS — BP 110/80 | HR 73 | Temp 98.1°F | Ht 71.0 in | Wt 229.1 lb

## 2013-06-07 DIAGNOSIS — N529 Male erectile dysfunction, unspecified: Secondary | ICD-10-CM

## 2013-06-07 DIAGNOSIS — F17200 Nicotine dependence, unspecified, uncomplicated: Secondary | ICD-10-CM

## 2013-06-07 DIAGNOSIS — L0292 Furuncle, unspecified: Secondary | ICD-10-CM | POA: Insufficient documentation

## 2013-06-07 DIAGNOSIS — L0293 Carbuncle, unspecified: Secondary | ICD-10-CM

## 2013-06-07 MED ORDER — VARENICLINE TARTRATE 1 MG PO TABS: 1.0000 mg | ORAL_TABLET | Freq: Two times a day (BID) | ORAL | Status: DC

## 2013-06-07 MED ORDER — TADALAFIL 20 MG PO TABS: 20.0000 mg | ORAL_TABLET | Freq: Every day | ORAL | Status: DC | PRN

## 2013-06-07 MED ORDER — SULFAMETHOXAZOLE-TRIMETHOPRIM 800-160 MG PO TABS: 1.0000 | ORAL_TABLET | Freq: Two times a day (BID) | ORAL | Status: DC

## 2013-06-07 MED ORDER — VARENICLINE TARTRATE 0.5 MG X 11 & 1 MG X 42 PO MISC: ORAL | Status: DC

## 2013-06-07 NOTE — Progress Notes (Signed)
Subjective:    Patient ID: [name], male    DOB: 10-16-1961, 52 y.o.   MRN: [id number]  HPI   Here with 2-3 days onset red/pain/tender to right pubic area, similar to previous episodes or recurrent boils, did drain some last night pus like material, no fever, chill, streaks, n/v or malaise.  Wants to quit smoking, asks for chantix as last time he was given rx the coupon was expired and o/w too expensive.  Also with 6 mo worsening ED symptoms, cant maintain erection.   Past Medical History  Diagnosis Date  . Coronary atherosclerosis of unspecified type of vessel, native or graft   . Pure hypercholesterolemia   . Tobacco use disorder   . Type II or unspecified type diabetes mellitus without mention of complication, not stated as uncontrolled   . Obstructive sleep apnea (adult) (pediatric)   . Problems related to high-risk sexual behavior   . Cellulitis and abscess of unspecified site   . Dysmetabolic syndrome X   . Erectile dysfunction   . Chronic rhinitis   . Hypertrophy of prostate with urinary obstruction and other lower urinary tract symptoms (LUTS)   . Family history of malignant neoplasm of gastrointestinal tract    Past Surgical History  Procedure Laterality Date  . Ptca    . Incision and drainage abscess / hematoma of bursa / knee / thigh      I&D of complex abscess,left axilla. I&D of large infected pilonidal abscess  . Cosmetic surgery      reports that he has been smoking.  He does not have any smokeless tobacco history on file. He reports that he drinks about 3.0 ounces of alcohol per week. He reports that he does not use illicit drugs. family history includes Cancer in his maternal grandmother, mother, and other and Heart disease in his maternal grandfather and other. Allergies  Allergen Reactions  . Penicillins Hives and Itching   Current Outpatient Prescriptions on File Prior to Visit  Medication Sig Dispense Refill  . aspirin EC 81 MG tablet Take 81 mg by mouth  daily.      Marland Kitchen HYDROcodone-acetaminophen (NORCO/VICODIN) 5-325 MG per tablet Take 1 tablet by mouth every 6 (six) hours as needed for pain.  15 tablet  0  . Multiple Vitamin (MULTIVITAMIN WITH MINERALS) TABS Take 1 tablet by mouth daily.      Marland Kitchen oxyCODONE-acetaminophen (PERCOCET/ROXICET) 5-325 MG per tablet Take 1-2 tablets by mouth every 6 (six) hours as needed for pain.  20 tablet  0   No current facility-administered medications on file prior to visit.   Review of Systems  Constitutional: Negative for unexpected weight change, or unusual diaphoresis  HENT: Negative for tinnitus.   Eyes: Negative for photophobia and visual disturbance.  Respiratory: Negative for choking and stridor.   Gastrointestinal: Negative for vomiting and blood in stool.  Genitourinary: Negative for hematuria and decreased urine volume.  Musculoskeletal: Negative for acute joint swelling Skin: Negative for color change and wound.  Neurological: Negative for tremors and numbness other than noted  Psychiatric/Behavioral: Negative for decreased concentration or  hyperactivity.       Objective:   Physical Exam BP 110/80  Pulse 73  Temp(Src) 98.1 F (36.7 C) (Oral)  Ht 5\' 11"  (1.803 m)  Wt 229 lb 2 oz (103.93 kg)  BMI 31.97 kg/m2  SpO2 97% VS noted,  Constitutional: Pt appears well-developed and well-nourished.  HENT: Head: NCAT.  Right Ear: External ear normal.  Left Ear: External  ear normal.  Eyes: Conjunctivae and EOM are normal. Pupils are equal, round, and reactive to light.  Neck: Normal range of motion. Neck supple.  Cardiovascular: Normal rate and regular rhythm.   Pulmonary/Chest: Effort normal and breath sounds normal.  Abd:  Soft, NT, non-distended, + BS Neurological: Pt is alert. Not confused  Skin: Skin is warm. No erythema. except for right pubic area with 1/2 cm area red/tender/swelling/fluctuance without drainage Psychiatric: Pt behavior is normal. Thought content normal.     Assessment &  Plan:

## 2013-06-07 NOTE — Assessment & Plan Note (Signed)
Ok for cialis prn,  to f/u any worsening symptoms or concerns  

## 2013-06-07 NOTE — Assessment & Plan Note (Signed)
Ok for chantix asd,  to f/u any worsening symptoms or concerns, gave new coupon

## 2013-06-07 NOTE — Patient Instructions (Signed)
Please take all new medication as prescribed Please continue all other medications as before, and refills have been done if requested.  Please remember to sign up for My Chart if you have not done so, as this will be important to you in the future with finding out test results, communicating by private email, and scheduling acute appointments online when needed.   

## 2013-09-18 ENCOUNTER — Encounter: Payer: Self-pay | Admitting: Internal Medicine

## 2013-09-18 ENCOUNTER — Other Ambulatory Visit (INDEPENDENT_AMBULATORY_CARE_PROVIDER_SITE_OTHER): Payer: BC Managed Care – PPO

## 2013-09-18 ENCOUNTER — Ambulatory Visit (INDEPENDENT_AMBULATORY_CARE_PROVIDER_SITE_OTHER): Payer: BC Managed Care – PPO | Admitting: Internal Medicine

## 2013-09-18 VITALS — BP 118/70 | HR 64 | Temp 97.0°F | Resp 16 | Ht 71.0 in | Wt 236.0 lb

## 2013-09-18 DIAGNOSIS — Z23 Encounter for immunization: Secondary | ICD-10-CM

## 2013-09-18 DIAGNOSIS — F17200 Nicotine dependence, unspecified, uncomplicated: Secondary | ICD-10-CM

## 2013-09-18 DIAGNOSIS — Z0000 Encounter for general adult medical examination without abnormal findings: Secondary | ICD-10-CM

## 2013-09-18 DIAGNOSIS — Z202 Contact with and (suspected) exposure to infections with a predominantly sexual mode of transmission: Secondary | ICD-10-CM

## 2013-09-18 DIAGNOSIS — E7800 Pure hypercholesterolemia, unspecified: Secondary | ICD-10-CM

## 2013-09-18 DIAGNOSIS — Z2089 Contact with and (suspected) exposure to other communicable diseases: Secondary | ICD-10-CM

## 2013-09-18 DIAGNOSIS — F528 Other sexual dysfunction not due to a substance or known physiological condition: Secondary | ICD-10-CM

## 2013-09-18 DIAGNOSIS — I2510 Atherosclerotic heart disease of native coronary artery without angina pectoris: Secondary | ICD-10-CM

## 2013-09-18 DIAGNOSIS — E119 Type 2 diabetes mellitus without complications: Secondary | ICD-10-CM

## 2013-09-18 LAB — TSH: TSH: 0.73 u[IU]/mL (ref 0.35–5.50)

## 2013-09-18 LAB — LIPID PANEL
Cholesterol: 170 mg/dL (ref 0–200)
LDL Cholesterol: 111 mg/dL — ABNORMAL HIGH (ref 0–99)
Triglycerides: 60 mg/dL (ref 0.0–149.0)
VLDL: 12 mg/dL (ref 0.0–40.0)

## 2013-09-18 LAB — URINALYSIS, ROUTINE W REFLEX MICROSCOPIC
Nitrite: NEGATIVE
Specific Gravity, Urine: 1.02 (ref 1.000–1.030)
Total Protein, Urine: NEGATIVE
Urine Glucose: NEGATIVE
pH: 6 (ref 5.0–8.0)

## 2013-09-18 LAB — COMPREHENSIVE METABOLIC PANEL
AST: 20 U/L (ref 0–37)
Albumin: 4.2 g/dL (ref 3.5–5.2)
BUN: 14 mg/dL (ref 6–23)
Calcium: 9.2 mg/dL (ref 8.4–10.5)
Chloride: 104 mEq/L (ref 96–112)
Creatinine, Ser: 1.1 mg/dL (ref 0.4–1.5)
Glucose, Bld: 88 mg/dL (ref 70–99)
Potassium: 4.1 mEq/L (ref 3.5–5.1)

## 2013-09-18 LAB — CBC WITH DIFFERENTIAL/PLATELET
Basophils Relative: 0.8 % (ref 0.0–3.0)
Eosinophils Absolute: 0.2 10*3/uL (ref 0.0–0.7)
Eosinophils Relative: 4.5 % (ref 0.0–5.0)
Hemoglobin: 14.3 g/dL (ref 13.0–17.0)
Lymphocytes Relative: 31.3 % (ref 12.0–46.0)
Monocytes Relative: 9.7 % (ref 3.0–12.0)
Neutro Abs: 2.5 10*3/uL (ref 1.4–7.7)
Neutrophils Relative %: 53.7 % (ref 43.0–77.0)
RBC: 5.03 Mil/uL (ref 4.22–5.81)
WBC: 4.6 10*3/uL (ref 4.5–10.5)

## 2013-09-18 LAB — FECAL OCCULT BLOOD, GUAIAC: Fecal Occult Blood: NEGATIVE

## 2013-09-18 MED ORDER — ATORVASTATIN CALCIUM 20 MG PO TABS: 20.0000 mg | ORAL_TABLET | Freq: Every day | ORAL | Status: DC

## 2013-09-18 MED ORDER — TADALAFIL 20 MG PO TABS: 20.0000 mg | ORAL_TABLET | Freq: Every day | ORAL | Status: DC | PRN

## 2013-09-18 MED ORDER — VARENICLINE TARTRATE 1 MG PO TABS: 1.0000 mg | ORAL_TABLET | Freq: Two times a day (BID) | ORAL | Status: DC

## 2013-09-18 MED ORDER — VARENICLINE TARTRATE 0.5 MG X 11 & 1 MG X 42 PO MISC: ORAL | Status: DC

## 2013-09-18 NOTE — Progress Notes (Signed)
Subjective:    Patient ID: [name], male    DOB: 1961/03/08, 52 y.o.   MRN: [id number]  Hyperlipidemia This is a chronic problem. The current episode started more than 1 year ago. The problem is uncontrolled. Recent lipid tests were reviewed and are variable. Exacerbating diseases include obesity. He has no history of chronic renal disease, diabetes, hypothyroidism, liver disease or nephrotic syndrome. There are no known factors aggravating his hyperlipidemia. Pertinent negatives include no chest pain, focal sensory loss, focal weakness, leg pain, myalgias or shortness of breath. He is currently on no antihyperlipidemic treatment. The current treatment provides no improvement of lipids. Compliance problems include adherence to diet, adherence to exercise and psychosocial issues.       Review of Systems  Constitutional: Negative.  Negative for fever, chills, diaphoresis, appetite change and fatigue.  HENT: Negative.   Eyes: Negative.   Respiratory: Negative.  Negative for apnea, cough, choking, chest tightness, shortness of breath, wheezing and stridor.   Cardiovascular: Negative.  Negative for chest pain, palpitations and leg swelling.  Gastrointestinal: Negative.  Negative for nausea, vomiting, abdominal pain, diarrhea, constipation and blood in stool.  Endocrine: Negative.   Genitourinary: Negative.   Musculoskeletal: Negative for arthralgias, back pain, gait problem, joint swelling, myalgias, neck pain and neck stiffness.  Skin: Negative.   Allergic/Immunologic: Negative.   Neurological: Negative.  Negative for dizziness, tremors, focal weakness, weakness, light-headedness and headaches.  Hematological: Negative.  Negative for adenopathy. Does not bruise/bleed easily.  Psychiatric/Behavioral: Negative.        Objective:   Physical Exam  Vitals reviewed. Constitutional: He is oriented to person, place, and time. He appears well-developed and well-nourished. No distress.  HENT:   Head: Normocephalic and atraumatic.  Mouth/Throat: Oropharynx is clear and moist. No oropharyngeal exudate.  Eyes: Conjunctivae are normal. Right eye exhibits no discharge. Left eye exhibits no discharge. No scleral icterus.  Neck: Normal range of motion. Neck supple. No JVD present. No tracheal deviation present. No thyromegaly present.  Cardiovascular: Normal rate, regular rhythm, normal heart sounds and intact distal pulses.  Exam reveals no gallop and no friction rub.   No murmur heard. Pulmonary/Chest: Effort normal and breath sounds normal. No stridor. No respiratory distress. He has no wheezes. He has no rales. He exhibits no tenderness.  Abdominal: Soft. Bowel sounds are normal. He exhibits no distension and no mass. There is no tenderness. There is no rebound and no guarding. Hernia confirmed negative in the right inguinal area and confirmed negative in the left inguinal area.  Genitourinary: Rectum normal, prostate normal, testes normal and penis normal. Rectal exam shows no external hemorrhoid, no internal hemorrhoid, no fissure, no mass, no tenderness and anal tone normal. Guaiac negative stool. Prostate is not enlarged and not tender. Right testis shows no mass, no swelling and no tenderness. Right testis is descended. Left testis shows no mass, no swelling and no tenderness. Left testis is descended. Circumcised. No penile erythema or penile tenderness. No discharge found.  Musculoskeletal: Normal range of motion. He exhibits no edema and no tenderness.  Lymphadenopathy:    He has no cervical adenopathy.       Right: No inguinal adenopathy present.       Left: No inguinal adenopathy present.  Neurological: He is oriented to person, place, and time.  Skin: Skin is warm and dry. No rash noted. He is not diaphoretic. No erythema. No pallor.  Psychiatric: He has a normal mood and affect. His behavior is normal. Judgment  and thought content normal.     Lab Results  Component Value  Date   WBC 7.0 10/29/2012   HGB 14.7 10/29/2012   HCT 42.5 10/29/2012   PLT 276 10/29/2012   GLUCOSE 90 05/21/2012   CHOL 168 03/22/2012   TRIG 91.0 03/22/2012   HDL 42.90 03/22/2012   LDLDIRECT 132.4 07/29/2008   LDLCALC 107* 03/22/2012   ALT 23 03/22/2012   AST 22 03/22/2012   NA 140 05/21/2012   K 4.3 05/21/2012   CL 104 05/21/2012   CREATININE 1.1 05/21/2012   BUN 15 05/21/2012   CO2 27 05/21/2012   TSH 1.17 03/22/2012   PSA 2.25 05/21/2012   HGBA1C 5.7 05/21/2012   MICROALBUR 1.2 03/22/2012       Assessment & Plan:

## 2013-09-18 NOTE — Patient Instructions (Signed)
Health Maintenance, Males A healthy lifestyle and preventative care can promote health and wellness.  Maintain regular health, dental, and eye exams.  Eat a healthy diet. Foods like vegetables, fruits, whole grains, low-fat dairy products, and lean protein foods contain the nutrients you need without too many calories. Decrease your intake of foods high in solid fats, added sugars, and salt. Get information about a proper diet from your caregiver, if necessary.  Regular physical exercise is one of the most important things you can do for your health. Most adults should get at least 150 minutes of moderate-intensity exercise (any activity that increases your heart rate and causes you to sweat) each week. In addition, most adults need muscle-strengthening exercises on 2 or more days a week.   Maintain a healthy weight. The body mass index (BMI) is a screening tool to identify possible weight problems. It provides an estimate of body fat based on height and weight. Your caregiver can help determine your BMI, and can help you achieve or maintain a healthy weight. For adults 20 years and older:  A BMI below 18.5 is considered underweight.  A BMI of 18.5 to 24.9 is normal.  A BMI of 25 to 29.9 is considered overweight.  A BMI of 30 and above is considered obese.  Maintain normal blood lipids and cholesterol by exercising and minimizing your intake of saturated fat. Eat a balanced diet with plenty of fruits and vegetables. Blood tests for lipids and cholesterol should begin at age 20 and be repeated every 5 years. If your lipid or cholesterol levels are high, you are over 50, or you are a high risk for heart disease, you may need your cholesterol levels checked more frequently.Ongoing high lipid and cholesterol levels should be treated with medicines, if diet and exercise are not effective.  If you smoke, find out from your caregiver how to quit. If you do not use tobacco, do not start.  If you  choose to drink alcohol, do not exceed 2 drinks per day. One drink is considered to be 12 ounces (355 mL) of beer, 5 ounces (148 mL) of wine, or 1.5 ounces (44 mL) of liquor.  Avoid use of street drugs. Do not share needles with anyone. Ask for help if you need support or instructions about stopping the use of drugs.  High blood pressure causes heart disease and increases the risk of stroke. Blood pressure should be checked at least every 1 to 2 years. Ongoing high blood pressure should be treated with medicines if weight loss and exercise are not effective.  If you are 45 to 50 years old, ask your caregiver if you should take aspirin to prevent heart disease.  Diabetes screening involves taking a blood sample to check your fasting blood sugar level. This should be done once every 3 years, after age 45, if you are within normal weight and without risk factors for diabetes. Testing should be considered at a younger age or be carried out more frequently if you are overweight and have at least 1 risk factor for diabetes.  Colorectal cancer can be detected and often prevented. Most routine colorectal cancer screening begins at the age of 50 and continues through age 75. However, your caregiver may recommend screening at an earlier age if you have risk factors for colon cancer. On a yearly basis, your caregiver may provide home test kits to check for hidden blood in the stool. Use of a small camera at the end of a tube,   to directly examine the colon (sigmoidoscopy or colonoscopy), can detect the earliest forms of colorectal cancer. Talk to your caregiver about this at age 50, when routine screening begins. Direct examination of the colon should be repeated every 5 to 10 years through age 75, unless early forms of pre-cancerous polyps or small growths are found.  Hepatitis C blood testing is recommended for all people born from 1945 through 1965 and any individual with known risks for hepatitis C.  Healthy  men should no longer receive prostate-specific antigen (PSA) blood tests as part of routine cancer screening. Consult with your caregiver about prostate cancer screening.  Testicular cancer screening is not recommended for adolescents or adult males who have no symptoms. Screening includes self-exam, caregiver exam, and other screening tests. Consult with your caregiver about any symptoms you have or any concerns you have about testicular cancer.  Practice safe sex. Use condoms and avoid high-risk sexual practices to reduce the spread of sexually transmitted infections (STIs).  Use sunscreen with a sun protection factor (SPF) of 30 or greater. Apply sunscreen liberally and repeatedly throughout the day. You should seek shade when your shadow is shorter than you. Protect yourself by wearing long sleeves, pants, a wide-brimmed hat, and sunglasses year round, whenever you are outdoors.  Notify your caregiver of new moles or changes in moles, especially if there is a change in shape or color. Also notify your caregiver if a mole is larger than the size of a pencil eraser.  A one-time screening for abdominal aortic aneurysm (AAA) and surgical repair of large AAAs by sound wave imaging (ultrasonography) is recommended for ages 65 to 75 years who are current or former smokers.  Stay current with your immunizations. Document Released: 05/12/2008 Document Revised: 02/06/2012 Document Reviewed: 04/11/2011 [employer] Patient Information 2014 [employer].  

## 2013-09-19 ENCOUNTER — Encounter: Payer: Self-pay | Admitting: Internal Medicine

## 2013-09-19 LAB — TESTOSTERONE, FREE, TOTAL, SHBG
Sex Hormone Binding: 49 nmol/L (ref 13–71)
Testosterone-% Free: 1.6 % (ref 1.6–2.9)
Testosterone: 386 ng/dL (ref 300–890)

## 2013-09-19 LAB — HIV ANTIBODY (ROUTINE TESTING W REFLEX): HIV: NONREACTIVE

## 2013-09-22 NOTE — Assessment & Plan Note (Signed)
I have asked him to restart the statin

## 2013-09-22 NOTE — Assessment & Plan Note (Signed)
I will check his testosterone level and other labs to look for secondary causes of ED

## 2013-09-22 NOTE — Assessment & Plan Note (Signed)
I have asked him to restart a statin and asa He is also due for a f/up with cardiology

## 2013-09-22 NOTE — Assessment & Plan Note (Signed)
HIV ab test ordered at his request today

## 2013-09-22 NOTE — Assessment & Plan Note (Addendum)
Exam done Vaccines were updated Labs ordered He was referred for a colonoscopy Pt ed material was given 

## 2013-09-22 NOTE — Assessment & Plan Note (Signed)
He will use chantix to quit smoking

## 2013-10-03 ENCOUNTER — Other Ambulatory Visit: Payer: Self-pay

## 2013-11-04 ENCOUNTER — Encounter: Payer: Self-pay | Admitting: Internal Medicine

## 2013-12-29 ENCOUNTER — Encounter (HOSPITAL_COMMUNITY): Payer: Self-pay | Admitting: Emergency Medicine

## 2013-12-29 ENCOUNTER — Emergency Department (HOSPITAL_COMMUNITY)
Admission: EM | Admit: 2013-12-29 | Discharge: 2013-12-29 | Disposition: A | Discharge status: Home / Self Care | Payer: BC Managed Care – PPO | Attending: Emergency Medicine | Admitting: Emergency Medicine

## 2013-12-29 DIAGNOSIS — E119 Type 2 diabetes mellitus without complications: Secondary | ICD-10-CM | POA: Insufficient documentation

## 2013-12-29 DIAGNOSIS — Z87448 Personal history of other diseases of urinary system: Secondary | ICD-10-CM | POA: Insufficient documentation

## 2013-12-29 DIAGNOSIS — M5410 Radiculopathy, site unspecified: Secondary | ICD-10-CM

## 2013-12-29 DIAGNOSIS — Z7982 Long term (current) use of aspirin: Secondary | ICD-10-CM | POA: Insufficient documentation

## 2013-12-29 DIAGNOSIS — R5383 Other fatigue: Secondary | ICD-10-CM

## 2013-12-29 DIAGNOSIS — Z880 Allergy status to penicillin: Secondary | ICD-10-CM | POA: Insufficient documentation

## 2013-12-29 DIAGNOSIS — R5381 Other malaise: Secondary | ICD-10-CM | POA: Insufficient documentation

## 2013-12-29 DIAGNOSIS — I2510 Atherosclerotic heart disease of native coronary artery without angina pectoris: Secondary | ICD-10-CM | POA: Insufficient documentation

## 2013-12-29 DIAGNOSIS — Z872 Personal history of diseases of the skin and subcutaneous tissue: Secondary | ICD-10-CM | POA: Insufficient documentation

## 2013-12-29 DIAGNOSIS — R209 Unspecified disturbances of skin sensation: Secondary | ICD-10-CM | POA: Insufficient documentation

## 2013-12-29 DIAGNOSIS — Z79899 Other long term (current) drug therapy: Secondary | ICD-10-CM | POA: Insufficient documentation

## 2013-12-29 DIAGNOSIS — M5412 Radiculopathy, cervical region: Secondary | ICD-10-CM | POA: Insufficient documentation

## 2013-12-29 DIAGNOSIS — Z8669 Personal history of other diseases of the nervous system and sense organs: Secondary | ICD-10-CM | POA: Insufficient documentation

## 2013-12-29 DIAGNOSIS — F17200 Nicotine dependence, unspecified, uncomplicated: Secondary | ICD-10-CM | POA: Insufficient documentation

## 2013-12-29 MED ORDER — CYCLOBENZAPRINE HCL 5 MG PO TABS: 5.0000 mg | ORAL_TABLET | Freq: Three times a day (TID) | ORAL | Status: DC | PRN

## 2013-12-29 NOTE — Discharge Instructions (Signed)
Cervical Radiculopathy  Cervical radiculopathy happens when a nerve in the neck is pinched or bruised by a slipped (herniated) disk or by arthritic changes in the bones of the cervical spine. This can occur due to an injury or as part of the normal aging process. Pressure on the cervical nerves can cause pain or numbness that runs from your neck all the way down into your arm and fingers.  CAUSES   There are many possible causes, including:  · Injury.  · Muscle tightness in the neck from overuse.  · Swollen, painful joints (arthritis).  · Breakdown or degeneration in the bones and joints of the spine (spondylosis) due to aging.  · Bone spurs that may develop near the cervical nerves.  SYMPTOMS   Symptoms include pain, weakness, or numbness in the affected arm and hand. Pain can be severe or irritating. Symptoms may be worse when extending or turning the neck.  DIAGNOSIS   Your caregiver will ask about your symptoms and do a physical exam. He or she may test your strength and reflexes. X-rays, CT scans, and MRI scans may be needed in cases of injury or if the symptoms do not go away after a period of time. Electromyography (EMG) or nerve conduction testing may be done to study how your nerves and muscles are working.  TREATMENT   Your caregiver may recommend certain exercises to help relieve your symptoms. Cervical radiculopathy can, and often does, get better with time and treatment. If your problems continue, treatment options may include:  · Wearing a soft collar for short periods of time.  · Physical therapy to strengthen the neck muscles.  · Medicines, such as nonsteroidal anti-inflammatory drugs (NSAIDs), oral corticosteroids, or spinal injections.  · Surgery. Different types of surgery may be done depending on the cause of your problems.  HOME CARE INSTRUCTIONS   · Put ice on the affected area.  · Put ice in a plastic bag.  · Place a towel between your skin and the bag.  · Leave the ice on for 15-20 minutes,  03-04 times a day or as directed by your caregiver.  · If ice does not help, you can try using heat. Take a warm shower or bath, or use a hot water bottle as directed by your caregiver.  · You may try a gentle neck and shoulder massage.  · Use a flat pillow when you sleep.  · Only take over-the-counter or prescription medicines for pain, discomfort, or fever as directed by your caregiver.  · If physical therapy was prescribed, follow your caregiver's directions.  · If a soft collar was prescribed, use it as directed.  SEEK IMMEDIATE MEDICAL CARE IF:   · Your pain gets much worse and cannot be controlled with medicines.  · You have weakness or numbness in your hand, arm, face, or leg.  · You have a high fever or a stiff, rigid neck.  · You lose bowel or bladder control (incontinence).  · You have trouble with walking, balance, or speaking.  MAKE SURE YOU:   · Understand these instructions.  · Will watch your condition.  · Will get help right away if you are not doing well or get worse.  Document Released: 08/09/2001 Document Revised: 02/06/2012 Document Reviewed: 06/28/2011  [employer]® Patient Information ©2014 [employer].

## 2013-12-29 NOTE — ED Provider Notes (Signed)
CSN: [id number]     Arrival date & time 12/29/13  1036 History   First MD Initiated Contact with Patient 12/29/13 1050     Chief Complaint  Patient presents with  . Arm Injury   (Consider location/radiation/quality/duration/timing/severity/associated sxs/prior Treatment) HPI Comments: Pt has been a serious weight lifter for about 4 years, has lost weight, gained sig muscle mass.  Pt is left handed.  He reports starting about 6 weeks ago and getting a little worse, actually has had episodes of pain waking him from sleep and uncomfortable numbness and tingling sensation in left forearm and fingertips, but not including thumb.  Pain will go away and be asymptomatic at time.  No specific movement seems to exacerbate it, however he has found over the past few weeks, he cannot do arm curls or shoulder presses due to perceived weakness.  He reports he can curl up to 85lbs with both arms normally, he reports even with 30 lbs, he has fatiguing with just 2 reps on left arm.  No CP, SOB, pleuritic pain.  Denies any neck injury, but with weight lifitng, may have injured arm or shoulder or neck  Patient is a 53 y.o. male presenting with extremity pain. The history is provided by the patient.  Extremity Pain This is a new problem. Episode onset: 6 weeks ago. Pertinent negatives include no chest pain, no abdominal pain and no shortness of breath.    Past Medical History  Diagnosis Date  . Coronary atherosclerosis of unspecified type of vessel, native or graft   . Pure hypercholesterolemia   . Tobacco use disorder   . Type II or unspecified type diabetes mellitus without mention of complication, not stated as uncontrolled   . Obstructive sleep apnea (adult) (pediatric)   . Problems related to high-risk sexual behavior   . Cellulitis and abscess of unspecified site   . Dysmetabolic syndrome X   . Erectile dysfunction   . Chronic rhinitis   . Hypertrophy of prostate with urinary obstruction and other lower  urinary tract symptoms (LUTS)   . Family history of malignant neoplasm of gastrointestinal tract    Past Surgical History  Procedure Laterality Date  . Ptca    . Incision and drainage abscess / hematoma of bursa / knee / thigh      I&D of complex abscess,left axilla. I&D of large infected pilonidal abscess  . Cosmetic surgery     Family History  Problem Relation Age of Onset  . Cancer Mother     breast  . Cancer Maternal Grandmother     colon cancer  . Heart disease Maternal Grandfather   . Cancer Other     colon cancer 1st degree relative<60  . Heart disease Other    History  Substance Use Topics  . Smoking status: Current Every Day Smoker -- 1.00 packs/day for 25 years  . Smokeless tobacco: Not on file  . Alcohol Use: 3.0 oz/week    5 Cans of beer per week    Review of Systems  Constitutional: Negative for fever and chills.  Respiratory: Negative for chest tightness and shortness of breath.   Cardiovascular: Negative for chest pain.  Gastrointestinal: Negative for abdominal pain.  Musculoskeletal: Positive for arthralgias. Negative for neck pain and neck stiffness.  Skin: Negative for color change, rash and wound.  Neurological: Positive for weakness and numbness.  All other systems reviewed and are negative.    Allergies  Penicillins  Home Medications   Current Outpatient Rx  Name  Route  Sig  Dispense  Refill  . aspirin EC 81 MG tablet   Oral   Take 81 mg by mouth daily.         . Multiple Vitamin (MULTIVITAMIN WITH MINERALS) TABS   Oral   Take 1 tablet by mouth daily.         . cyclobenzaprine (FLEXERIL) 5 MG tablet   Oral   Take 1 tablet (5 mg total) by mouth 3 (three) times daily as needed for muscle spasms.   20 tablet   0    BP 117/88  Pulse 75  Temp(Src) 98.4 F (36.9 C) (Oral)  Resp 20  SpO2 100% Physical Exam  Nursing note and vitals reviewed. Constitutional: He appears well-developed and well-nourished.  HENT:  Head:  Normocephalic and atraumatic.  Cardiovascular: Normal rate, regular rhythm and intact distal pulses.   Pulmonary/Chest: Effort normal. No respiratory distress. He has no wheezes.  Abdominal: Soft.  Musculoskeletal:       Cervical back: He exhibits normal range of motion and no bony tenderness.  Neurological: He is alert. He is not disoriented. He displays no atrophy and no tremor. No sensory deficit. He exhibits normal muscle tone. Coordination normal.  No difference in biceps or triceps strength, B, left grip is marginally weaker than on right.  No wrist drop.  Median, ulnar and radial distributions seem intact.    Skin: Skin is warm.    ED Course  Procedures (including critical care time) Labs Review Labs Reviewed - No data to display Imaging Review No results found.  EKG Interpretation   None      RA sat is 100% and I interpret to be normal   MDM   1. Radiculopathy affecting upper extremity      Pt with muscle fatigue, possibly nerve inflammation.  Unclear at this point from my exam if source may be a cervical disc herniation and cervial radiculopathy versus atypical presentation of carpal tunnel.  Carpal tunnel could explain forearm symptoms and hand, but not his left shoulder and posterior upper back discomfort.  Doubt his is cardiac as symptoms are reproducible, involved distal muscle weakness.  Will place in wrist splint and order outpt cervical MRI and forward results to Dr. Ronnald Ramp.  Pt can follow up with Dr. Ronnald Ramp and possibly may need further sports medicine or ortho referral once MRI is done.      Saddie Benders. Akshita Italiano, MD 12/29/13 1131

## 2013-12-29 NOTE — ED Notes (Signed)
Pt from home reports numbness to L fingers and forearm x 1.5 months. Pt denies injury, but states that he works out at gym. Pt states that over past month, symptoms have worsened. When questioned, pt states that he has had dizziness, more tired, but denies visual changes. Pt is A&O and in NAD

## 2013-12-30 ENCOUNTER — Ambulatory Visit (HOSPITAL_COMMUNITY)
Admission: RE | Admit: 2013-12-30 | Discharge: 2013-12-30 | Disposition: A | Discharge status: Home / Self Care | Payer: BC Managed Care – PPO | Source: Ambulatory Visit | Attending: Emergency Medicine | Admitting: Emergency Medicine

## 2013-12-30 DIAGNOSIS — M5020 Other cervical disc displacement, unspecified cervical region: Secondary | ICD-10-CM | POA: Insufficient documentation

## 2014-01-02 ENCOUNTER — Telehealth: Payer: Self-pay

## 2014-01-02 NOTE — Telephone Encounter (Signed)
The patient called and is hoping to get the results of his recent MRI   Callback - [phone]

## 2014-01-02 NOTE — Telephone Encounter (Signed)
Pt called again for results.

## 2014-01-02 NOTE — Telephone Encounter (Signed)
Herniated discs with damage to the nerves and spinal cord

## 2014-01-02 NOTE — Telephone Encounter (Signed)
Pt.notified

## 2014-01-06 ENCOUNTER — Encounter: Payer: Self-pay | Admitting: Internal Medicine

## 2014-01-06 ENCOUNTER — Ambulatory Visit (INDEPENDENT_AMBULATORY_CARE_PROVIDER_SITE_OTHER): Payer: BC Managed Care – PPO | Admitting: Internal Medicine

## 2014-01-06 VITALS — BP 112/80 | HR 81 | Temp 98.2°F | Resp 16 | Ht 71.0 in | Wt 234.0 lb

## 2014-01-06 DIAGNOSIS — E785 Hyperlipidemia, unspecified: Secondary | ICD-10-CM

## 2014-01-06 DIAGNOSIS — I2510 Atherosclerotic heart disease of native coronary artery without angina pectoris: Secondary | ICD-10-CM

## 2014-01-06 DIAGNOSIS — F17200 Nicotine dependence, unspecified, uncomplicated: Secondary | ICD-10-CM

## 2014-01-06 DIAGNOSIS — M5412 Radiculopathy, cervical region: Secondary | ICD-10-CM

## 2014-01-06 MED ORDER — PITAVASTATIN CALCIUM 2 MG PO TABS: 1.0000 | ORAL_TABLET | Freq: Every day | ORAL | Status: DC

## 2014-01-06 MED ORDER — IBUPROFEN-FAMOTIDINE 800-26.6 MG PO TABS: 1.0000 | ORAL_TABLET | Freq: Three times a day (TID) | ORAL | Status: DC | PRN

## 2014-01-06 MED ORDER — METHYLPREDNISOLONE ACETATE 80 MG/ML IJ SUSP
120.0000 mg | Freq: Once | INTRAMUSCULAR | Status: AC
  Administered 2014-01-06: 120 mg via INTRAMUSCULAR

## 2014-01-06 NOTE — Patient Instructions (Signed)

## 2014-01-06 NOTE — Progress Notes (Signed)
Subjective:    Patient ID: [name], male    DOB: 1961-10-15, 53 y.o.   MRN: [id number]  Hyperlipidemia This is a chronic problem. The current episode started more than 1 year ago. The problem is uncontrolled. Recent lipid tests were reviewed and are variable. Exacerbating diseases include obesity. He has no history of chronic renal disease, diabetes, hypothyroidism, liver disease or nephrotic syndrome. There are no known factors aggravating his hyperlipidemia. Pertinent negatives include no chest pain, focal sensory loss, leg pain, myalgias or shortness of breath. He is currently on no antihyperlipidemic treatment. The current treatment provides no improvement of lipids. Compliance problems include adherence to exercise, adherence to diet and psychosocial issues.   Neck Pain  This is a recurrent problem. The current episode started more than 1 month ago. The problem occurs intermittently. The problem has been gradually worsening. The pain is associated with nothing. The pain is present in the left side. The quality of the pain is described as aching and burning. The pain is at a severity of 3/10. The pain is mild. The symptoms are aggravated by position and twisting. The pain is worse during the day. Associated symptoms include numbness (left hand) and tingling (left hand). Pertinent negatives include no chest pain, fever, headaches, leg pain, pain with swallowing, paresis, photophobia, syncope, trouble swallowing, visual change, weakness or weight loss. He has tried muscle relaxants for the symptoms. The treatment provided mild relief.      Review of Systems  Constitutional: Negative.  Negative for fever, chills, weight loss, diaphoresis, appetite change and fatigue.  HENT: Negative.  Negative for trouble swallowing.   Eyes: Negative.  Negative for photophobia.  Respiratory: Negative.  Negative for cough, choking, chest tightness, shortness of breath and stridor.   Cardiovascular: Negative.   Negative for chest pain, palpitations, leg swelling and syncope.  Gastrointestinal: Negative.  Negative for nausea, vomiting, abdominal pain, diarrhea, constipation and blood in stool.  Endocrine: Negative.   Genitourinary: Negative.   Musculoskeletal: Positive for neck pain. Negative for arthralgias, back pain, gait problem, joint swelling, myalgias and neck stiffness.  Allergic/Immunologic: Negative.   Neurological: Positive for tingling (left hand), speech difficulty and numbness (left hand). Negative for dizziness, tremors, seizures, syncope, facial asymmetry, weakness and headaches.  Hematological: Negative.  Negative for adenopathy. Does not bruise/bleed easily.  Psychiatric/Behavioral: Negative.        Objective:   Physical Exam  Vitals reviewed. Constitutional: He appears well-developed and well-nourished. No distress.  HENT:  Head: Normocephalic and atraumatic.  Mouth/Throat: Oropharynx is clear and moist. No oropharyngeal exudate.  Eyes: Conjunctivae are normal. Right eye exhibits no discharge. Left eye exhibits no discharge. No scleral icterus.  Neck: Normal range of motion. Neck supple. No JVD present. No tracheal deviation present. No thyromegaly present.  Cardiovascular: Normal rate, regular rhythm, normal heart sounds and intact distal pulses.  Exam reveals no gallop and no friction rub.   No murmur heard. Pulmonary/Chest: Effort normal and breath sounds normal. No stridor. No respiratory distress. He has no wheezes. He has no rales. He exhibits no tenderness.  Abdominal: Soft. Bowel sounds are normal. He exhibits no distension and no mass. There is no tenderness. There is no rebound and no guarding.  Musculoskeletal: Normal range of motion. He exhibits no edema and no tenderness.       Cervical back: Normal. He exhibits normal range of motion, no tenderness, no bony tenderness, no swelling, no edema, no deformity, no laceration, no pain, no spasm and  normal pulse.    Lymphadenopathy:    He has no cervical adenopathy.  Neurological: He is alert. He has normal strength. He displays no atrophy, no tremor and normal reflexes. No cranial nerve deficit or sensory deficit. He exhibits normal muscle tone. He displays a negative Romberg sign. He displays no seizure activity. Coordination and gait normal.  Reflex Scores:      Tricep reflexes are 1+ on the right side and 1+ on the left side.      Bicep reflexes are 1+ on the right side and 1+ on the left side.      Brachioradialis reflexes are 1+ on the right side and 1+ on the left side.      Patellar reflexes are 1+ on the right side and 1+ on the left side.      Achilles reflexes are 1+ on the right side and 1+ on the left side. Skin: Skin is warm and dry. No rash noted. He is not diaphoretic. No erythema. No pallor.  Psychiatric: He has a normal mood and affect. His behavior is normal. Judgment and thought content normal.          Assessment & Plan:

## 2014-01-06 NOTE — Progress Notes (Signed)
Pre visit review using our clinic review tool, if applicable. No additional management support is needed unless otherwise documented below in the visit note. 

## 2014-01-07 ENCOUNTER — Telehealth: Payer: Self-pay

## 2014-01-07 NOTE — Assessment & Plan Note (Signed)
He agrees to start livalo for this

## 2014-01-07 NOTE — Assessment & Plan Note (Signed)
He needs to be on a statin - start livalo

## 2014-01-07 NOTE — Assessment & Plan Note (Signed)
I encouraged him to quit smoking and he is contemplating this

## 2014-01-07 NOTE — Assessment & Plan Note (Signed)
I will treat with an injection of depo-medrol IM and duexis I have also asked him to see pain management for further evaluation

## 2014-01-07 NOTE — Telephone Encounter (Signed)
Received pharmacy rejection stating that insurance  [facility] [phone]) will not cover livalo or duexis  without a prior authorization. Covered medications are crestor,simcor, or vytorin, alternatives for duexis are celebrex or generic NSAID. Thanks

## 2014-01-08 NOTE — Telephone Encounter (Signed)
He has samples, take all of the samples and get back to me after all of the samples are gone

## 2014-09-12 ENCOUNTER — Other Ambulatory Visit: Payer: Self-pay

## 2014-10-29 ENCOUNTER — Encounter: Payer: Self-pay | Admitting: Internal Medicine

## 2014-10-29 ENCOUNTER — Ambulatory Visit (INDEPENDENT_AMBULATORY_CARE_PROVIDER_SITE_OTHER)
Admission: RE | Admit: 2014-10-29 | Discharge: 2014-10-29 | Disposition: A | Discharge status: Home / Self Care | Payer: BC Managed Care – PPO | Source: Ambulatory Visit | Attending: Internal Medicine | Admitting: Internal Medicine

## 2014-10-29 ENCOUNTER — Ambulatory Visit (INDEPENDENT_AMBULATORY_CARE_PROVIDER_SITE_OTHER): Payer: BC Managed Care – PPO | Admitting: Internal Medicine

## 2014-10-29 VITALS — BP 120/74 | HR 69 | Temp 98.2°F | Resp 16 | Ht 71.0 in | Wt 240.0 lb

## 2014-10-29 DIAGNOSIS — S86001A Unspecified injury of right Achilles tendon, initial encounter: Secondary | ICD-10-CM

## 2014-10-29 DIAGNOSIS — S99911A Unspecified injury of right ankle, initial encounter: Secondary | ICD-10-CM | POA: Insufficient documentation

## 2014-10-29 DIAGNOSIS — S86009A Unspecified injury of unspecified Achilles tendon, initial encounter: Secondary | ICD-10-CM | POA: Insufficient documentation

## 2014-10-29 NOTE — Progress Notes (Signed)
Pre visit review using our clinic review tool, if applicable. No additional management support is needed unless otherwise documented below in the visit note. 

## 2014-10-29 NOTE — Progress Notes (Signed)
   Subjective:    Patient ID: [name], male    DOB: [birth year], 53 y.o.   MRN: [id number]  HPI  He was at work a few days ago and pushed off with his right foot and now complains of pain over the back of the ankle around the achilles. He has taken motrin and got some relief.  Review of Systems  Constitutional: Negative.   HENT: Negative.   Eyes: Negative.   Respiratory: Negative.  Negative for cough, chest tightness, shortness of breath and stridor.   Cardiovascular: Negative.   Gastrointestinal: Negative.  Negative for abdominal pain.  Endocrine: Negative.   Genitourinary: Negative.   Musculoskeletal: Positive for arthralgias. Negative for myalgias, back pain, joint swelling, gait problem, neck pain and neck stiffness.  Skin: Negative.   Allergic/Immunologic: Negative.   Neurological: Negative.   Hematological: Negative.  Negative for adenopathy. Does not bruise/bleed easily.  Psychiatric/Behavioral: Negative.        Objective:   Physical Exam  Musculoskeletal:       Right ankle: Normal. He exhibits normal range of motion, no swelling, no ecchymosis, no deformity, no laceration and normal pulse. No tenderness. No lateral malleolus and no medial malleolus tenderness found. Achilles tendon normal. Achilles tendon exhibits no pain, no defect and normal Thompson's test results.      Lab Results  Component Value Date   WBC 4.6 09/18/2013   HGB 14.3 09/18/2013   HCT 42.1 09/18/2013   PLT 240.0 09/18/2013   GLUCOSE 88 09/18/2013   CHOL 170 09/18/2013   TRIG 60.0 09/18/2013   HDL 47.40 09/18/2013   LDLDIRECT 132.4 07/29/2008   LDLCALC 111* 09/18/2013   ALT 23 09/18/2013   AST 20 09/18/2013   NA 143 09/18/2013   K 4.1 09/18/2013   CL 104 09/18/2013   CREATININE 1.1 09/18/2013   BUN 14 09/18/2013   CO2 30 09/18/2013   TSH 0.73 09/18/2013   PSA 2.42 09/18/2013   HGBA1C 5.7 05/21/2012   MICROALBUR 1.2 03/22/2012      Assessment & Plan:

## 2014-10-29 NOTE — Patient Instructions (Signed)
Achilles Tendinitis Achilles tendinitis is inflammation of the tough, cord-like band that attaches the lower muscles of your leg to your heel (Achilles tendon). It is usually caused by overusing the tendon and joint involved.  CAUSES Achilles tendinitis can happen because of:  A sudden increase in exercise or activity (such as running).  Doing the same exercises or activities (such as jumping) over and over.  Not warming up calf muscles before exercising.  Exercising in shoes that are worn out or not made for exercise.  Having arthritis or a bone growth on the back of the heel bone. This can rub against the tendon and hurt the tendon. SIGNS AND SYMPTOMS The most common symptoms are:  Pain in the back of the leg, just above the heel. The pain usually gets worse with exercise and better with rest.  Stiffness or soreness in the back of the leg, especially in the morning.  Swelling of the skin over the Achilles tendon.  Trouble standing on tiptoe. Sometimes, an Achilles tendon tears (ruptures). Symptoms of an Achilles tendon rupture can include:  Sudden, severe pain in the back of the leg.  Trouble putting weight on the foot or walking normally. DIAGNOSIS Achilles tendinitis will be diagnosed based on symptoms and a physical examination. An X-ray may be done to check if another condition is causing your symptoms. An MRI may be ordered if your health care provider suspects you may have completely torn your tendon, which is called an Achilles tendon rupture.  TREATMENT  Achilles tendinitis usually gets better over time. It can take weeks to months to heal completely. Treatment focuses on treating the symptoms and helping the injury heal. HOME CARE INSTRUCTIONS   Rest your Achilles tendon and avoid activities that cause pain.  Apply ice to the injured area:  Put ice in a plastic bag.  Place a towel between your skin and the bag.  Leave the ice on for 20 minutes, 2-3 times a  day  Try to avoid using the tendon (other than gentle range of motion) while the tendon is painful. Do not resume use until instructed by your health care provider. Then begin use gradually. Do not increase use to the point of pain. If pain does develop, decrease use and continue the above measures. Gradually increase activities that do not cause discomfort until you achieve normal use.  Do exercises to make your calf muscles stronger and more flexible. Your health care provider or physical therapist can recommend exercises for you to do.  Wrap your ankle with an elastic bandage or other wrap. This can help keep your tendon from moving too much. Your health care provider will show you how to wrap your ankle correctly.  Only take over-the-counter or prescription medicines for pain, discomfort, or fever as directed by your health care provider. SEEK MEDICAL CARE IF:   Your pain and swelling increase or pain is uncontrolled with medicines.  You develop new, unexplained symptoms or your symptoms get worse.  You are unable to move your toes or foot.  You develop warmth and swelling in your foot.  You have an unexplained temperature. MAKE SURE YOU:   Understand these instructions.  Will watch your condition.  Will get help right away if you are not doing well or get worse. Document Released: 08/24/2005 Document Revised: 09/04/2013 Document Reviewed: 06/26/2013 [employer] Patient Information 2015 [employer]. This information is not intended to replace advice given to you by your health care provider. Make sure you discuss   any questions you have with your health care provider.  

## 2014-10-30 ENCOUNTER — Telehealth: Payer: Self-pay | Admitting: Internal Medicine

## 2014-10-30 NOTE — Assessment & Plan Note (Signed)
Xray and plain film are normal Will treat for strain

## 2014-10-30 NOTE — Assessment & Plan Note (Signed)
Exam and xray are normal Will cont motrin as needed Will RIE as well

## 2014-10-30 NOTE — Telephone Encounter (Signed)
yes

## 2014-10-30 NOTE — Telephone Encounter (Signed)
Please advise 

## 2014-10-30 NOTE — Telephone Encounter (Signed)
Notified pt md ok work note fax to # below...[name]

## 2014-10-30 NOTE — Telephone Encounter (Signed)
Patient called stating his ankle is feeling better and would like to know if we can release him to go back to work tomorrow, 10/31/14, with no restrictions. Fax to his employer Attn: Legrand Como Prior [phone]. Patient would like someone to call him when this is done.

## 2014-11-10 ENCOUNTER — Ambulatory Visit (INDEPENDENT_AMBULATORY_CARE_PROVIDER_SITE_OTHER): Payer: BC Managed Care – PPO | Admitting: Internal Medicine

## 2014-11-10 ENCOUNTER — Other Ambulatory Visit (INDEPENDENT_AMBULATORY_CARE_PROVIDER_SITE_OTHER): Payer: BC Managed Care – PPO

## 2014-11-10 ENCOUNTER — Encounter: Payer: Self-pay | Admitting: Internal Medicine

## 2014-11-10 VITALS — BP 120/70 | HR 79 | Temp 98.2°F | Resp 16 | Ht 71.0 in | Wt 242.0 lb

## 2014-11-10 DIAGNOSIS — Z0000 Encounter for general adult medical examination without abnormal findings: Secondary | ICD-10-CM

## 2014-11-10 DIAGNOSIS — E785 Hyperlipidemia, unspecified: Secondary | ICD-10-CM

## 2014-11-10 DIAGNOSIS — I25118 Atherosclerotic heart disease of native coronary artery with other forms of angina pectoris: Secondary | ICD-10-CM

## 2014-11-10 DIAGNOSIS — R072 Precordial pain: Secondary | ICD-10-CM

## 2014-11-10 LAB — TSH: TSH: 0.81 u[IU]/mL (ref 0.35–4.50)

## 2014-11-10 LAB — PSA: PSA: 2.98 ng/mL (ref 0.10–4.00)

## 2014-11-10 LAB — TROPONIN I: TNIDX: 0 ug/L (ref 0.00–0.06)

## 2014-11-10 LAB — URINALYSIS, ROUTINE W REFLEX MICROSCOPIC
BILIRUBIN URINE: NEGATIVE
HGB URINE DIPSTICK: NEGATIVE
KETONES UR: NEGATIVE
LEUKOCYTES UA: NEGATIVE
Nitrite: NEGATIVE
Specific Gravity, Urine: <=1.005 — AB (ref 1.000–1.030)
Total Protein, Urine: NEGATIVE
Urine Glucose: NEGATIVE
Urobilinogen, UA: 0.2 (ref 0.0–1.0)
WBC, UA: NONE SEEN (ref 0–?)
pH: 6.5 (ref 5.0–8.0)

## 2014-11-10 LAB — CBC WITH DIFFERENTIAL/PLATELET
Basophils Absolute: 0 10*3/uL (ref 0.0–0.1)
Basophils Relative: 0.4 % (ref 0.0–3.0)
EOS ABS: 0.1 10*3/uL (ref 0.0–0.7)
EOS PCT: 2.3 % (ref 0.0–5.0)
HCT: 42.3 % (ref 39.0–52.0)
Hemoglobin: 14 g/dL (ref 13.0–17.0)
LYMPHS PCT: 23.8 % (ref 12.0–46.0)
Lymphs Abs: 1.5 10*3/uL (ref 0.7–4.0)
MCHC: 33.1 g/dL (ref 30.0–36.0)
MCV: 82.8 fl (ref 78.0–100.0)
MONO ABS: 0.7 10*3/uL (ref 0.1–1.0)
Monocytes Relative: 10.7 % (ref 3.0–12.0)
NEUTROS PCT: 62.8 % (ref 43.0–77.0)
Neutro Abs: 4.1 10*3/uL (ref 1.4–7.7)
Platelets: 246 10*3/uL (ref 150.0–400.0)
RBC: 5.11 Mil/uL (ref 4.22–5.81)
RDW: 13.4 % (ref 11.5–15.5)
WBC: 6.4 10*3/uL (ref 4.0–10.5)

## 2014-11-10 LAB — FECAL OCCULT BLOOD, GUAIAC: FECAL OCCULT BLD: NEGATIVE

## 2014-11-10 MED ORDER — ROSUVASTATIN CALCIUM 40 MG PO TABS: 40.0000 mg | ORAL_TABLET | Freq: Every day | ORAL | Status: DC

## 2014-11-10 NOTE — Progress Notes (Signed)
Pre visit review using our clinic review tool, if applicable. No additional management support is needed unless otherwise documented below in the visit note. 

## 2014-11-10 NOTE — Progress Notes (Signed)
Subjective:    Patient ID: [name], male    DOB: 09-14-1961, 53 y.o.   MRN: [id number]  HPI Comments: He complains of diffuse chest pressure that occurs only in the mornings when he is getting up and getting his day started. He also requests a complete physical and STI screen.  Chest Pain  This is a recurrent problem. The current episode started more than 1 year ago. The onset quality is gradual. The problem occurs intermittently. The problem has been unchanged. The pain is present in the substernal region. The pain is at a severity of 1/10. The pain is mild. The quality of the pain is described as pressure. The pain does not radiate. Associated symptoms include exertional chest pressure. Pertinent negatives include no abdominal pain, back pain, claudication, cough, diaphoresis, dizziness, fever, headaches, hemoptysis, irregular heartbeat, leg pain, lower extremity edema, malaise/fatigue, nausea, near-syncope, numbness, orthopnea, palpitations, PND, shortness of breath, sputum production, syncope, vomiting or weakness. The pain is aggravated by nothing. He has tried NSAIDs for the symptoms. The treatment provided moderate relief. Risk factors include smoking/tobacco exposure, stress, male gender and obesity.  His past medical history is significant for CAD.      Review of Systems  Constitutional: Negative.  Negative for fever, chills, malaise/fatigue, diaphoresis, appetite change and fatigue.  HENT: Negative.   Eyes: Negative.   Respiratory: Positive for chest tightness. Negative for apnea, cough, hemoptysis, sputum production, choking, shortness of breath, wheezing and stridor.   Cardiovascular: Positive for chest pain. Negative for palpitations, orthopnea, claudication, leg swelling, syncope, PND and near-syncope.  Gastrointestinal: Positive for constipation. Negative for nausea, vomiting, abdominal pain, diarrhea, blood in stool, abdominal distention, anal bleeding and rectal pain.    Endocrine: Negative.   Genitourinary: Positive for frequency.  Musculoskeletal: Negative.  Negative for back pain.  Skin: Negative.   Allergic/Immunologic: Negative.   Neurological: Negative.  Negative for dizziness, tremors, weakness, light-headedness, numbness and headaches.  Hematological: Negative.  Negative for adenopathy. Does not bruise/bleed easily.  Psychiatric/Behavioral: Negative.        Objective:   Physical Exam  Constitutional: He is oriented to person, place, and time. He appears well-developed and well-nourished. No distress.  HENT:  Head: Normocephalic and atraumatic.  Mouth/Throat: Oropharynx is clear and moist. No oropharyngeal exudate.  Eyes: Conjunctivae are normal. Right eye exhibits no discharge. Left eye exhibits no discharge. No scleral icterus.  Neck: Normal range of motion. Neck supple. No JVD present. No tracheal deviation present. No thyromegaly present.  Cardiovascular: Normal rate, regular rhythm, normal heart sounds and intact distal pulses.  Exam reveals no gallop and no friction rub.   No murmur heard. Pulmonary/Chest: Effort normal and breath sounds normal. No stridor. No respiratory distress. He has no wheezes. He has no rales. He exhibits no tenderness.  Abdominal: Soft. Bowel sounds are normal. He exhibits no distension and no mass. There is no tenderness. There is no rebound and no guarding. Hernia confirmed negative in the right inguinal area and confirmed negative in the left inguinal area.  Genitourinary: Rectum normal, prostate normal, testes normal and penis normal. Rectal exam shows no external hemorrhoid, no internal hemorrhoid, no fissure, no mass, no tenderness and anal tone normal. Guaiac negative stool. Prostate is not enlarged and not tender. Right testis shows no mass, no swelling and no tenderness. Right testis is descended. Left testis shows no mass, no swelling and no tenderness. Left testis is descended. Circumcised. No penile erythema  or penile tenderness. No discharge found.  Musculoskeletal: Normal range of motion. He exhibits no edema or tenderness.  Lymphadenopathy:    He has no cervical adenopathy.       Right: No inguinal adenopathy present.       Left: No inguinal adenopathy present.  Neurological: He is oriented to person, place, and time.  Skin: Skin is warm and dry. No rash noted. He is not diaphoretic. No erythema. No pallor.  Psychiatric: He has a normal mood and affect. His behavior is normal. Judgment and thought content normal.  Vitals reviewed.    Lab Results  Component Value Date   WBC 4.6 09/18/2013   HGB 14.3 09/18/2013   HCT 42.1 09/18/2013   PLT 240.0 09/18/2013   GLUCOSE 88 09/18/2013   CHOL 170 09/18/2013   TRIG 60.0 09/18/2013   HDL 47.40 09/18/2013   LDLDIRECT 132.4 07/29/2008   LDLCALC 111* 09/18/2013   ALT 23 09/18/2013   AST 20 09/18/2013   NA 143 09/18/2013   K 4.1 09/18/2013   CL 104 09/18/2013   CREATININE 1.1 09/18/2013   BUN 14 09/18/2013   CO2 30 09/18/2013   TSH 0.73 09/18/2013   PSA 2.42 09/18/2013   HGBA1C 5.7 05/21/2012   MICROALBUR 1.2 03/22/2012       Assessment & Plan:

## 2014-11-10 NOTE — Patient Instructions (Signed)
Health Maintenance A healthy lifestyle and preventative care can promote health and wellness.  Maintain regular health, dental, and eye exams.  Eat a healthy diet. Foods like vegetables, fruits, whole grains, low-fat dairy products, and lean protein foods contain the nutrients you need and are low in calories. Decrease your intake of foods high in solid fats, added sugars, and salt. Get information about a proper diet from your health care provider, if necessary.  Regular physical exercise is one of the most important things you can do for your health. Most adults should get at least 150 minutes of moderate-intensity exercise (any activity that increases your heart rate and causes you to sweat) each week. In addition, most adults need muscle-strengthening exercises on 2 or more days a week.   Maintain a healthy weight. The body mass index (BMI) is a screening tool to identify possible weight problems. It provides an estimate of body fat based on height and weight. Your health care provider can find your BMI and can help you achieve or maintain a healthy weight. For males 20 years and older:  A BMI below 18.5 is considered underweight.  A BMI of 18.5 to 24.9 is normal.  A BMI of 25 to 29.9 is considered overweight.  A BMI of 30 and above is considered obese.  Maintain normal blood lipids and cholesterol by exercising and minimizing your intake of saturated fat. Eat a balanced diet with plenty of fruits and vegetables. Blood tests for lipids and cholesterol should begin at age 20 and be repeated every 5 years. If your lipid or cholesterol levels are high, you are over age 50, or you are at high risk for heart disease, you may need your cholesterol levels checked more frequently.Ongoing high lipid and cholesterol levels should be treated with medicines if diet and exercise are not working.  If you smoke, find out from your health care provider how to quit. If you do not use tobacco, do not  start.  Lung cancer screening is recommended for adults aged 55-80 years who are at high risk for developing lung cancer because of a history of smoking. A yearly low-dose CT scan of the lungs is recommended for people who have at least a 30-pack-year history of smoking and are current smokers or have quit within the past 15 years. A pack year of smoking is smoking an average of 1 pack of cigarettes a day for 1 year (for example, a 30-pack-year history of smoking could mean smoking 1 pack a day for 30 years or 2 packs a day for 15 years). Yearly screening should continue until the smoker has stopped smoking for at least 15 years. Yearly screening should be stopped for people who develop a health problem that would prevent them from having lung cancer treatment.  If you choose to drink alcohol, do not have more than 2 drinks per day. One drink is considered to be 12 oz (360 mL) of beer, 5 oz (150 mL) of wine, or 1.5 oz (45 mL) of liquor.  Avoid the use of street drugs. Do not share needles with anyone. Ask for help if you need support or instructions about stopping the use of drugs.  High blood pressure causes heart disease and increases the risk of stroke. Blood pressure should be checked at least every 1-2 years. Ongoing high blood pressure should be treated with medicines if weight loss and exercise are not effective.  If you are 45-79 years old, ask your health care provider if   you should take aspirin to prevent heart disease.  Diabetes screening involves taking a blood sample to check your fasting blood sugar level. This should be done once every 3 years after age 45 if you are at a normal weight and without risk factors for diabetes. Testing should be considered at a younger age or be carried out more frequently if you are overweight and have at least 1 risk factor for diabetes.  Colorectal cancer can be detected and often prevented. Most routine colorectal cancer screening begins at the age of 50  and continues through age 75. However, your health care provider may recommend screening at an earlier age if you have risk factors for colon cancer. On a yearly basis, your health care provider may provide home test kits to check for hidden blood in the stool. A small camera at the end of a tube may be used to directly examine the colon (sigmoidoscopy or colonoscopy) to detect the earliest forms of colorectal cancer. Talk to your health care provider about this at age 50 when routine screening begins. A direct exam of the colon should be repeated every 5-10 years through age 75, unless early forms of precancerous polyps or small growths are found.  People who are at an increased risk for hepatitis B should be screened for this virus. You are considered at high risk for hepatitis B if:  You were born in a country where hepatitis B occurs often. Talk with your health care provider about which countries are considered high risk.  Your parents were born in a high-risk country and you have not received a shot to protect against hepatitis B (hepatitis B vaccine).  You have HIV or AIDS.  You use needles to inject street drugs.  You live with, or have sex with, someone who has hepatitis B.  You are a man who has sex with other men (MSM).  You get hemodialysis treatment.  You take certain medicines for conditions like cancer, organ transplantation, and autoimmune conditions.  Hepatitis C blood testing is recommended for all people born from 1945 through 1965 and any individual with known risk factors for hepatitis C.  Healthy men should no longer receive prostate-specific antigen (PSA) blood tests as part of routine cancer screening. Talk to your health care provider about prostate cancer screening.  Testicular cancer screening is not recommended for adolescents or adult males who have no symptoms. Screening includes self-exam, a health care provider exam, and other screening tests. Consult with your  health care provider about any symptoms you have or any concerns you have about testicular cancer.  Practice safe sex. Use condoms and avoid high-risk sexual practices to reduce the spread of sexually transmitted infections (STIs).  You should be screened for STIs, including gonorrhea and chlamydia if:  You are sexually active and are younger than 24 years.  You are older than 24 years, and your health care provider tells you that you are at risk for this type of infection.  Your sexual activity has changed since you were last screened, and you are at an increased risk for chlamydia or gonorrhea. Ask your health care provider if you are at risk.  If you are at risk of being infected with HIV, it is recommended that you take a prescription medicine daily to prevent HIV infection. This is called pre-exposure prophylaxis (PrEP). You are considered at risk if:  You are a man who has sex with other men (MSM).  You are a heterosexual man who   is sexually active with multiple partners.  You take drugs by injection.  You are sexually active with a partner who has HIV.  Talk with your health care provider about whether you are at high risk of being infected with HIV. If you choose to begin PrEP, you should first be tested for HIV. You should then be tested every 3 months for as long as you are taking PrEP.  Use sunscreen. Apply sunscreen liberally and repeatedly throughout the day. You should seek shade when your shadow is shorter than you. Protect yourself by wearing long sleeves, pants, a wide-brimmed hat, and sunglasses year round whenever you are outdoors.  Tell your health care provider of new moles or changes in moles, especially if there is a change in shape or color. Also, tell your health care provider if a mole is larger than the size of a pencil eraser.  A one-time screening for abdominal aortic aneurysm (AAA) and surgical repair of large AAAs by ultrasound is recommended for men aged  19-75 years who are current or former smokers.  Stay current with your vaccines (immunizations). Document Released: 05/12/2008 Document Revised: 11/19/2013 Document Reviewed: 04/11/2011 [facility] Patient Information 2015 [place], [place]. This information is not intended to replace advice given to you by your health care provider. Make sure you discuss any questions you have with your health care provider. Chest Pain (Nonspecific) It is often hard to give a specific diagnosis for the cause of chest pain. There is always a chance that your pain could be related to something serious, such as a heart attack or a blood clot in the lungs. You need to follow up with your health care provider for further evaluation. CAUSES   Heartburn.  Pneumonia or bronchitis.  Anxiety or stress.  Inflammation around your heart (pericarditis) or lung (pleuritis or pleurisy).  A blood clot in the lung.  A collapsed lung (pneumothorax). It can develop suddenly on its own (spontaneous pneumothorax) or from trauma to the chest.  Shingles infection (herpes zoster virus). The chest wall is composed of bones, muscles, and cartilage. Any of these can be the source of the pain.  The bones can be bruised by injury.  The muscles or cartilage can be strained by coughing or overwork.  The cartilage can be affected by inflammation and become sore (costochondritis). DIAGNOSIS  Lab tests or other studies may be needed to find the cause of your pain. Your health care provider may have you take a test called an ambulatory electrocardiogram (ECG). An ECG records your heartbeat patterns over a 24-hour period. You may also have other tests, such as:  Transthoracic echocardiogram (TTE). During echocardiography, sound waves are used to evaluate how blood flows through your heart.  Transesophageal echocardiogram (TEE).  Cardiac monitoring. This allows your health care provider to monitor your heart rate and rhythm in real  time.  Holter monitor. This is a portable device that records your heartbeat and can help diagnose heart arrhythmias. It allows your health care provider to track your heart activity for several days, if needed.  Stress tests by exercise or by giving medicine that makes the heart beat faster. TREATMENT   Treatment depends on what may be causing your chest pain. Treatment may include:  Acid blockers for heartburn.  Anti-inflammatory medicine.  Pain medicine for inflammatory conditions.  Antibiotics if an infection is present.  You may be advised to change lifestyle habits. This includes stopping smoking and avoiding alcohol, caffeine, and chocolate.  You may be  advised to keep your head raised (elevated) when sleeping. This reduces the chance of acid going backward from your stomach into your esophagus. Most of the time, nonspecific chest pain will improve within 2-3 days with rest and mild pain medicine.  HOME CARE INSTRUCTIONS   If antibiotics were prescribed, take them as directed. Finish them even if you start to feel better.  For the next few days, avoid physical activities that bring on chest pain. Continue physical activities as directed.  Do not use any tobacco products, including cigarettes, chewing tobacco, or electronic cigarettes.  Avoid drinking alcohol.  Only take medicine as directed by your health care provider.  Follow your health care provider's suggestions for further testing if your chest pain does not go away.  Keep any follow-up appointments you made. If you do not go to an appointment, you could develop lasting (chronic) problems with pain. If there is any problem keeping an appointment, call to reschedule. SEEK MEDICAL CARE IF:   Your chest pain does not go away, even after treatment.  You have a rash with blisters on your chest.  You have a fever. SEEK IMMEDIATE MEDICAL CARE IF:   You have increased chest pain or pain that spreads to your arm,  neck, jaw, back, or abdomen.  You have shortness of breath.  You have an increasing cough, or you cough up blood.  You have severe back or abdominal pain.  You feel nauseous or vomit.  You have severe weakness.  You faint.  You have chills. This is an emergency. Do not wait to see if the pain will go away. Get medical help at once. Call your local emergency services (911 in [place] Do not drive yourself to the hospital. MAKE SURE YOU:   Understand these instructions.  Will watch your condition.  Will get help right away if you are not doing well or get worse. Document Released: 08/24/2005 Document Revised: 11/19/2013 Document Reviewed: 06/19/2008 [facility] Patient Information 2015 [place], [place]. This information is not intended to replace advice given to you by your health care provider. Make sure you discuss any questions you have with your health care provider.

## 2014-11-11 ENCOUNTER — Encounter: Payer: Self-pay | Admitting: Internal Medicine

## 2014-11-11 DIAGNOSIS — R072 Precordial pain: Secondary | ICD-10-CM | POA: Insufficient documentation

## 2014-11-11 LAB — COMPREHENSIVE METABOLIC PANEL
ALBUMIN: 3.9 g/dL (ref 3.5–5.2)
ALT: 21 U/L (ref 0–53)
AST: 21 U/L (ref 0–37)
Alkaline Phosphatase: 93 U/L (ref 39–117)
BUN: 18 mg/dL (ref 6–23)
CALCIUM: 8.7 mg/dL (ref 8.4–10.5)
CHLORIDE: 105 meq/L (ref 96–112)
CO2: 25 mEq/L (ref 19–32)
Creatinine, Ser: 1.3 mg/dL (ref 0.4–1.5)
GFR: 77.48 mL/min (ref >60.00)
GLUCOSE: 76 mg/dL (ref 70–99)
Potassium: 4.1 mEq/L (ref 3.5–5.1)
Sodium: 137 mEq/L (ref 135–145)
TOTAL PROTEIN: 7 g/dL (ref 6.0–8.3)
Total Bilirubin: 0.6 mg/dL (ref 0.2–1.2)

## 2014-11-11 LAB — RPR

## 2014-11-11 LAB — CARDIAC PANEL
CK MB: 2.6 ng/mL (ref 0.3–4.0)
Relative Index: 0.7 calc (ref 0.0–2.5)
Total CK: 349 U/L — ABNORMAL HIGH (ref 7–232)

## 2014-11-11 LAB — LIPID PANEL
CHOLESTEROL: 171 mg/dL (ref 0–200)
HDL: 38.1 mg/dL — ABNORMAL LOW (ref >39.00)
LDL CALC: 101 mg/dL — AB (ref 0–99)
NonHDL: 132.9
TRIGLYCERIDES: 160 mg/dL — AB (ref 0.0–149.0)
Total CHOL/HDL Ratio: 4
VLDL: 32 mg/dL (ref 0.0–40.0)

## 2014-11-11 LAB — GC/CHLAMYDIA PROBE AMP, URINE
Chlamydia, Swab/Urine, PCR: NEGATIVE
GC Probe Amp, Urine: NEGATIVE

## 2014-11-11 LAB — HIV ANTIBODY (ROUTINE TESTING W REFLEX): HIV 1&2 Ab, 4th Generation: NONREACTIVE

## 2014-11-11 NOTE — Assessment & Plan Note (Signed)
Exam, EKG, and cardiac enzymes are normal Has has CAD so will get a lexiscan done

## 2014-11-11 NOTE — Assessment & Plan Note (Signed)
Labs done as requested Vaccines were updated Exam done He was referred for a colonscopy

## 2014-11-11 NOTE — Assessment & Plan Note (Signed)
He reports CP that is typical in some ways and atypical in other ways His EKG is normal and cardiac enzymes are normal I have asked him to get a lexiscan done

## 2014-11-11 NOTE — Assessment & Plan Note (Signed)
He has not been complaint with livalo He has CAD so will start high dose crestor

## 2014-11-12 LAB — HSV(HERPES SMPLX)ABS-I+II(IGG+IGM)-BLD
HSV 1 Glycoprotein G Ab, IgG: 0.57 IV
HSV 2 Glycoprotein G Ab, IgG: 10.74 IV — ABNORMAL HIGH
Herpes Simplex Vrs I&II-IgM Ab (EIA): 1.06 INDEX

## 2014-12-08 ENCOUNTER — Ambulatory Visit (HOSPITAL_COMMUNITY): Payer: BLUE CROSS/BLUE SHIELD | Attending: Cardiology | Admitting: Radiology

## 2014-12-08 DIAGNOSIS — E785 Hyperlipidemia, unspecified: Secondary | ICD-10-CM | POA: Diagnosis not present

## 2014-12-08 DIAGNOSIS — R072 Precordial pain: Secondary | ICD-10-CM

## 2014-12-08 DIAGNOSIS — R079 Chest pain, unspecified: Secondary | ICD-10-CM | POA: Diagnosis not present

## 2014-12-08 DIAGNOSIS — I2510 Atherosclerotic heart disease of native coronary artery without angina pectoris: Secondary | ICD-10-CM | POA: Insufficient documentation

## 2014-12-08 DIAGNOSIS — I25118 Atherosclerotic heart disease of native coronary artery with other forms of angina pectoris: Secondary | ICD-10-CM

## 2014-12-08 MED ORDER — TECHNETIUM TC 99M SESTAMIBI GENERIC - CARDIOLITE
10.0000 | Freq: Once | INTRAVENOUS | Status: AC | PRN
  Administered 2014-12-08: 10 via INTRAVENOUS

## 2014-12-08 MED ORDER — TECHNETIUM TC 99M SESTAMIBI GENERIC - CARDIOLITE
30.0000 | Freq: Once | INTRAVENOUS | Status: AC | PRN
  Administered 2014-12-08: 30 via INTRAVENOUS

## 2014-12-08 MED ORDER — REGADENOSON 0.4 MG/5ML IV SOLN
0.4000 mg | Freq: Once | INTRAVENOUS | Status: AC
  Administered 2014-12-08: 0.4 mg via INTRAVENOUS

## 2014-12-08 NOTE — Progress Notes (Signed)
[facility] [address] [place], [place] [address] [phone]    Cardiology Nuclear Med Study  [name] is a 54 y.o. male     MRN : [id number]     DOB: September 27, 1961  Procedure Date: 12/08/2014  Nuclear Med Background Indication for Stress Test:  Evaluation for Ischemia History:  CAD-PTCA, '06 MPI: EF: 51% Ischemia Low Risk Cardiac Risk Factors: Lipids  Symptoms:  Chest Pain   Nuclear Pre-Procedure Caffeine/Decaff Intake:  9:00pm NPO After: 6:00am   Lungs:  clear O2 Sat: 97% on room air. IV 0.9% NS with Angio Cath:  22g  IV Site: R Hand  IV Started by:  Matilde Haymaker, RN  Chest Size (in):  52 Cup Size: n/a  Height: 5\' 11"  (1.803 m)  Weight:  239 lb (108.41 kg)  BMI:  Body mass index is 33.35 kg/(m^2). Tech Comments:  n/a    Nuclear Med Study 1 or 2 day study: 1 day  Stress Test Type:  Lexiscan  Reading MD: n/a  Order Authorizing Provider:  Volanda Napoleon   Resting Radionuclide: Technetium 81m Sestamibi  Resting Radionuclide Dose: 11.0 mCi   Stress Radionuclide:  Technetium 77m Sestamibi  Stress Radionuclide Dose: 33.0 mCi           Stress Protocol Rest HR: 64 Stress HR: 87  Rest BP: 110/84 Stress BP: 127/86  Exercise Time (min): n/a METS: n/a   Predicted Max HR: 169 bpm % Max HR: 51.48 bpm Rate Pressure Product: 11049   Dose of Adenosine (mg):  n/a Dose of Lexiscan: 0.4 mg  Dose of Atropine (mg): n/a Dose of Dobutamine: n/a mcg/kg/min (at max HR)  Stress Test Technologist: Perrin Maltese, EMT-P  Nuclear Technologist:  Earl Many, CNMT     Rest Procedure:  Myocardial perfusion imaging was performed at rest 45 minutes following the intravenous administration of Technetium 55m Sestamibi. Rest ECG: NSR-LVH  Stress Procedure:  The patient received IV Lexiscan 0.4 mg over 15-seconds.  Technetium 82m Sestamibi injected at 30-seconds. This patient had sob and felt weird with the Lexiscan injection. Quantitative spect images  were obtained after a 45 minute delay. Stress ECG: No significant change from baseline ECG  QPS Raw Data Images:  Diaphragm and bowel artifact Stress Images:  There is decreased uptake in the inferior wall. Rest Images:  There is decreased uptake in the inferior wall. Subtraction (SDS):  There is a fixed inferior defect that is most consistent with diaphragmatic attenuation. Transient Ischemic Dilatation (Normal <1.22):  0.90 Lung/Heart Ratio (Normal <0.45):  0.32  Quantitative Gated Spect Images QGS EDV:  158 ml QGS ESV:  76 ml  Impression Exercise Capacity:  Lexiscan with no exercise. BP Response:  Normal blood pressure response. Clinical Symptoms:  There is dyspnea. ECG Impression:  No significant ST segment change suggestive of ischemia. Comparison with Prior Nuclear Study: No images to compare  Overall Impression:  Low risk stress nuclear study Attenuation of the mid and basal inferior wall in setting of diaphragm and bowel artifact No ischemia Similar to description of myovue from August of 2011.  LV Ejection Fraction: 52%.  LV Wall Motion:  NL LV Function; NL Wall Motion   Jenkins Rouge

## 2014-12-09 NOTE — Addendum Note (Signed)
Addended by: Janith Lima on: 12/09/2014 10:49 AM   Modules accepted: Miquel Dunn

## 2014-12-12 ENCOUNTER — Encounter: Payer: Self-pay | Admitting: Internal Medicine

## 2015-01-12 ENCOUNTER — Ambulatory Visit (AMBULATORY_SURGERY_CENTER): Payer: Self-pay | Admitting: *Deleted

## 2015-01-12 VITALS — Ht 72.0 in | Wt 241.6 lb

## 2015-01-12 DIAGNOSIS — Z1211 Encounter for screening for malignant neoplasm of colon: Secondary | ICD-10-CM

## 2015-01-12 MED ORDER — MOVIPREP 100 G PO SOLR: 1.0000 | Freq: Once | ORAL | Status: DC

## 2015-01-12 NOTE — Progress Notes (Signed)
Denies allergies to eggs or soy products. Denies complications with sedation or anesthesia. Denies O2 use. Denies use of diet or weight loss medications.  Emmi instructions given for colonoscopy.  

## 2015-01-26 ENCOUNTER — Ambulatory Visit (AMBULATORY_SURGERY_CENTER): Payer: BLUE CROSS/BLUE SHIELD | Admitting: Internal Medicine

## 2015-01-26 ENCOUNTER — Other Ambulatory Visit: Payer: Self-pay

## 2015-01-26 ENCOUNTER — Encounter: Payer: Self-pay | Admitting: Internal Medicine

## 2015-01-26 ENCOUNTER — Telehealth: Payer: Self-pay

## 2015-01-26 VITALS — BP 112/80 | HR 67 | Temp 97.7°F | Resp 21 | Ht 72.0 in | Wt 241.0 lb

## 2015-01-26 DIAGNOSIS — Z1211 Encounter for screening for malignant neoplasm of colon: Secondary | ICD-10-CM

## 2015-01-26 DIAGNOSIS — K639 Disease of intestine, unspecified: Secondary | ICD-10-CM

## 2015-01-26 DIAGNOSIS — K6389 Other specified diseases of intestine: Secondary | ICD-10-CM

## 2015-01-26 DIAGNOSIS — D123 Benign neoplasm of transverse colon: Secondary | ICD-10-CM

## 2015-01-26 DIAGNOSIS — D120 Benign neoplasm of cecum: Secondary | ICD-10-CM

## 2015-01-26 MED ORDER — CIPROFLOXACIN HCL 500 MG PO TABS: 500.0000 mg | ORAL_TABLET | Freq: Two times a day (BID) | ORAL | Status: DC

## 2015-01-26 MED ORDER — SODIUM CHLORIDE 0.9 % IV SOLN: 500.0000 mL | INTRAVENOUS | Status: DC

## 2015-01-26 MED ORDER — METRONIDAZOLE 500 MG PO TABS: ORAL_TABLET | ORAL | Status: DC

## 2015-01-26 NOTE — Patient Instructions (Signed)
YOU HAD AN ENDOSCOPIC PROCEDURE TODAY AT [facility]:   Refer to the procedure report that was given to you for any specific questions about what was found during the examination.  If the procedure report does not answer your questions, please call your gastroenterologist to clarify.  If you requested that your care partner not be given the details of your procedure findings, then the procedure report has been included in a sealed envelope for you to review at your convenience later.  YOU SHOULD EXPECT: Some feelings of bloating in the abdomen. Passage of more gas than usual.  Walking can help get rid of the air that was put into your GI tract during the procedure and reduce the bloating. If you had a lower endoscopy (such as a colonoscopy or flexible sigmoidoscopy) you may notice spotting of blood in your stool or on the toilet paper. If you underwent a bowel prep for your procedure, you may not have a normal bowel movement for a few days.  Please Note:  You might notice some irritation and congestion in your nose or some drainage.  This is from the oxygen used during your procedure.  There is no need for concern and it should clear up in a day or so.  SYMPTOMS TO REPORT IMMEDIATELY:   Following lower endoscopy (colonoscopy or flexible sigmoidoscopy):  Excessive amounts of blood in the stool  Significant tenderness or worsening of abdominal pains  Swelling of the abdomen that is new, acute  Fever of 100F or higher   Following upper endoscopy (EGD)  Vomiting of blood or coffee ground material  New chest pain or pain under the shoulder blades  Painful or persistently difficult swallowing  New shortness of breath  Fever of 100F or higher  Black, tarry-looking stools  A gastroenterologist can be reached at any hour by calling [phone].   DIET: Your first meal following the procedure should be a small meal and then it is ok to progress to your normal diet. Heavy or  fried foods are harder to digest and may make you feel nauseous or bloated.  Likewise, meals heavy in dairy and vegetables can increase bloating.  Drink plenty of fluids but you should avoid alcoholic beverages for 24 hours.  ACTIVITY:  You should plan to take it easy for the rest of today and you should NOT DRIVE or use heavy machinery until tomorrow (because of the sedation medicines used during the test).    FOLLOW UP: Our staff will call the number listed on your records the next business day following your procedure to check on you and address any questions or concerns that you may have regarding the information given to you following your procedure. If we do not reach you, we will leave a message.  However, if you are feeling well and you are not experiencing any problems, there is no need to return our call.  We will assume that you have returned to your regular daily activities without incident.  If any biopsies were taken you will be contacted by phone or by letter within the next 1-3 weeks.  Please call us at [phone] if you have not heard about the biopsies in 3 weeks.    SIGNATURES/CONFIDENTIALITY: You and/or your care partner have signed paperwork which will be entered into your electronic medical record.  These signatures attest to the fact that that the information above on your After Visit Summary has been reviewed and is understood.  Full  responsibility of the confidentiality of this discharge information lies with you and/or your care-partner.  Polyp and diverticulosis information given. CT contrast given with directions. Avoid NSAIDS.

## 2015-01-26 NOTE — Telephone Encounter (Signed)
Pt scheduled for CT of A/P at [facility] 01/27/15@3 :30pm. Pt to be NPO after 11:30am except for bottle 1 of contrast at 1:30pm, bottle 2 at 2:30pm. Jane-[facility] RN- to give pt appt info.

## 2015-01-26 NOTE — Op Note (Signed)
[facility]  [address] [place], [address]   COLONOSCOPY PROCEDURE REPORT  PATIENT: Scott, [name]  MR#: [id number] BIRTHDATE: 1961/10/14 , 63  yrs. old GENDER: male ENDOSCOPIST: Jerene Bears, MD REFERRED BB:[id number] Evalina Field, M.D. PROCEDURE DATE:  01/26/2015 PROCEDURE:   Colonoscopy with biopsy and Colonoscopy with snare polypectomy First Screening Colonoscopy - Avg.  risk and is 50 yrs.  old or older - No.  Prior Negative Screening - Now for repeat screening. N/A  History of Adenoma - Now for follow-up colonoscopy & has been > or = to 3 yrs.  N/A  Polyps Removed Today? Yes. ASA CLASS:   Class III INDICATIONS:patient's family history of colon cancer, distant relatives. MEDICATIONS: Monitored anesthesia care, Propofol 400 mg IV, and Lidocaine 200 mg IV  DESCRIPTION OF PROCEDURE:   After the risks benefits and alternatives of the procedure were thoroughly explained, informed consent was obtained.  The digital rectal exam revealed no rectal mass.   The LB [id number]  endoscope was introduced through the anus and advanced to the terminal ileum which was intubated for a short distance. No adverse events experienced.   The quality of the prep was good, using MoviPrep  The instrument was then slowly withdrawn as the colon was fully examined.   COLON FINDINGS: The examined terminal ileum appeared to be normal. A mass, possibly inflammatory,  measuring approximately 2 X 2cm in size was found at the cecum.  This is located underneath the IC valve and was covered with clotted blood.  Multiple biopsies of the area were performed using cold forceps.   A sessile polyp measuring 5 mm in size was found in the transverse colon.  A polypectomy was performed with a cold snare.  The resection was complete, the polyp tissue was completely retrieved and sent to histology.   There was moderate diverticulosis noted in the ascending colon, transverse colon, descending  sigmoid, and sigmoid colon.  Retroflexed views revealed no abnormalities. The time to cecum=5 minutes 11 seconds. Withdrawal time=16 minutes 29 seconds.  The scope was withdrawn and the procedure completed.  COMPLICATIONS: There were no immediate complications.     ENDOSCOPIC IMPRESSION: 1.   The examined terminal ileum appeared to be normal 2.   Mass, inflammatory versus neoplastic, was found at the cecum; multiple biopsies of the area were performed using cold forceps 3.   Sessile polyp was found in the transverse colon; polypectomy was performed with a cold snare 4.   Moderate diverticulosis was noted in the transverse colon, ascending colon, sigmoid colon, and descending colon  RECOMMENDATIONS: 1.  Await pathology results 2.  Avoid all NSAIDs 3.  CT scan abdomen/pelvis with IV contrast to further examine cecum as biopsies pending 4.  Timing of repeat colonoscopy will be determined by pathology findings. 5.  You will receive a letter within 1-2 weeks with the results of your biopsy as well as final recommendations.  Please call my office if you have not received a letter after 3 weeks.  eSigned:  Jerene Bears, MD 01/26/2015 11:53 AM   cc: Janith Lima, MD and The Patient   PATIENT NAME:  Scott, [name] MR#: [id number]

## 2015-01-26 NOTE — Progress Notes (Signed)
Report to PACU, RN, vss, BBS= Clear.  

## 2015-01-26 NOTE — Progress Notes (Signed)
Called to room to assist during endoscopic procedure.  Patient ID and intended procedure confirmed with present staff. Received instructions for my participation in the procedure from the performing physician.  

## 2015-01-27 ENCOUNTER — Telehealth: Payer: Self-pay | Admitting: *Deleted

## 2015-01-27 ENCOUNTER — Ambulatory Visit (INDEPENDENT_AMBULATORY_CARE_PROVIDER_SITE_OTHER)
Admission: RE | Admit: 2015-01-27 | Discharge: 2015-01-27 | Disposition: A | Discharge status: Home / Self Care | Payer: BLUE CROSS/BLUE SHIELD | Source: Ambulatory Visit | Attending: Internal Medicine | Admitting: Internal Medicine

## 2015-01-27 DIAGNOSIS — K6389 Other specified diseases of intestine: Secondary | ICD-10-CM

## 2015-01-27 MED ORDER — IOHEXOL 300 MG/ML  SOLN: 100.0000 mL | Freq: Once | INTRAMUSCULAR | Status: AC | PRN

## 2015-01-27 NOTE — Telephone Encounter (Signed)
  Follow up Call-  Call back number 01/26/2015  Post procedure Call Back phone  # [phone]  Permission to leave phone message Yes     Patient questions:  Do you have a fever, pain , or abdominal swelling? No. Pain Score  0 *  Have you tolerated food without any problems? Yes.    Have you been able to return to your normal activities? Yes.    Do you have any questions about your discharge instructions: Diet   No. Medications  No. Follow up visit  No.  Do you have questions or concerns about your Care? Yes.    Actions: * If pain score is 4 or above: No action needed, pain <4.

## 2015-01-29 LAB — HM COLONOSCOPY

## 2015-01-29 NOTE — Addendum Note (Signed)
Addended by: Janith Lima on: 01/29/2015 01:50 PM   Modules accepted: Miquel Dunn

## 2015-02-17 ENCOUNTER — Other Ambulatory Visit: Payer: Self-pay | Admitting: General Surgery

## 2015-02-26 ENCOUNTER — Encounter (HOSPITAL_COMMUNITY): Payer: Self-pay | Admitting: Pharmacy Technician

## 2015-03-02 ENCOUNTER — Encounter (HOSPITAL_COMMUNITY): Payer: Self-pay

## 2015-03-02 ENCOUNTER — Encounter (HOSPITAL_COMMUNITY)
Admission: RE | Admit: 2015-03-02 | Discharge: 2015-03-02 | Disposition: A | Discharge status: Home / Self Care | Payer: BLUE CROSS/BLUE SHIELD | Source: Ambulatory Visit | Attending: General Surgery | Admitting: General Surgery

## 2015-03-02 DIAGNOSIS — Z01812 Encounter for preprocedural laboratory examination: Secondary | ICD-10-CM | POA: Insufficient documentation

## 2015-03-02 HISTORY — DX: Other complications of anesthesia, initial encounter: T88.59XA

## 2015-03-02 LAB — BASIC METABOLIC PANEL
ANION GAP: 13 (ref 5–15)
BUN: 12 mg/dL (ref 6–23)
CALCIUM: 9.2 mg/dL (ref 8.4–10.5)
CO2: 22 mmol/L (ref 19–32)
CREATININE: 1.19 mg/dL (ref 0.50–1.35)
Chloride: 104 mmol/L (ref 96–112)
GFR calc non Af Amer: 69 mL/min — ABNORMAL LOW (ref >90)
GFR, EST AFRICAN AMERICAN: 80 mL/min — AB (ref >90)
Glucose, Bld: 86 mg/dL (ref 70–99)
Potassium: 4.1 mmol/L (ref 3.5–5.1)
Sodium: 139 mmol/L (ref 135–145)

## 2015-03-02 LAB — CBC WITH DIFFERENTIAL/PLATELET
BASOS PCT: 1 % (ref 0–1)
Basophils Absolute: 0 10*3/uL (ref 0.0–0.1)
Eosinophils Absolute: 0.2 10*3/uL (ref 0.0–0.7)
Eosinophils Relative: 5 % (ref 0–5)
HEMATOCRIT: 40.8 % (ref 39.0–52.0)
HEMOGLOBIN: 13.9 g/dL (ref 13.0–17.0)
LYMPHS PCT: 37 % (ref 12–46)
Lymphs Abs: 1.6 10*3/uL (ref 0.7–4.0)
MCH: 27.6 pg (ref 26.0–34.0)
MCHC: 34.1 g/dL (ref 30.0–36.0)
MCV: 81 fL (ref 78.0–100.0)
MONO ABS: 0.5 10*3/uL (ref 0.1–1.0)
Monocytes Relative: 10 % (ref 3–12)
NEUTROS ABS: 2 10*3/uL (ref 1.7–7.7)
NEUTROS PCT: 47 % (ref 43–77)
Platelets: 225 10*3/uL (ref 150–400)
RBC: 5.04 MIL/uL (ref 4.22–5.81)
RDW: 13.2 % (ref 11.5–15.5)
WBC: 4.3 10*3/uL (ref 4.0–10.5)

## 2015-03-02 NOTE — Pre-Procedure Instructions (Signed)
[name]  03/02/2015   Your procedure is scheduled on:   Tuesday  03/10/15  Report to [facility] Admitting at 900 AM.  Call this number if you have problems the morning of surgery: [phone]   Remember:   Do not eat food or drink liquids after midnight.   Take these medicines the morning of surgery with A SIP OF WATER:  (STOP ASPIRIN, COUMADIN, PLAVIX, EFFIENT, HERBAL MEDICINES)   Do not wear jewelry, make-up or nail polish.  Do not wear lotions, powders, or perfumes. You may wear deodorant.  Do not shave 48 hours prior to surgery. Men may shave face and neck.  Do not bring valuables to the hospital.  [facility] is not responsible                  for any belongings or valuables.               Contacts, dentures or bridgework may not be worn into surgery.  Leave suitcase in the car. After surgery it may be brought to your room.  For patients admitted to the hospital, discharge time is determined by your                treatment team.               Patients discharged the day of surgery will not be allowed to drive  home.  Name and phone number of your driver:   Special Instructions: [place] - Preparing for Surgery  Before surgery, you can play an important role.  Because skin is not sterile, your skin needs to be as free of germs as possible.  You can reduce the number of germs on you skin by washing with CHG (chlorahexidine gluconate) soap before surgery.  CHG is an antiseptic cleaner which kills germs and bonds with the skin to continue killing germs even after washing.  Please DO NOT use if you have an allergy to CHG or antibacterial soaps.  If your skin becomes reddened/irritated stop using the CHG and inform your nurse when you arrive at Short Stay.  Do not shave (including legs and underarms) for at least 48 hours prior to the first CHG shower.  You may shave your face.  Please follow these instructions carefully:   1.  Shower with CHG Soap the night  before surgery and the                                morning of Surgery.  2.  If you choose to wash your hair, wash your hair first as usual with your       normal shampoo.  3.  After you shampoo, rinse your hair and body thoroughly to remove the                      Shampoo.  4.  Use CHG as you would any other liquid soap.  You can apply chg directly       to the skin and wash gently with scrungie or a clean washcloth.  5.  Apply the CHG Soap to your body ONLY FROM THE NECK DOWN.        Do not use on open wounds or open sores.  Avoid contact with your eyes,       ears, mouth and genitals (private parts).  Wash genitals (private parts)  with your normal soap.  6.  Wash thoroughly, paying special attention to the area where your surgery        will be performed.  7.  Thoroughly rinse your body with warm water from the neck down.  8.  DO NOT shower/wash with your normal soap after using and rinsing off       the CHG Soap.  9.  Pat yourself dry with a clean towel.            10.  Wear clean pajamas.            11.  Place clean sheets on your bed the night of your first shower and do not        sleep with pets.  Day of Surgery  Do not apply any lotions/deoderants the morning of surgery.  Please wear clean clothes to the hospital/surgery center.     Please read over the following fact sheets that you were given: Pain Booklet, Coughing and Deep Breathing and Surgical Site Infection Prevention

## 2015-03-03 LAB — CEA: CEA: 2.1 ng/mL (ref 0.0–4.7)

## 2015-03-09 MED ORDER — HEPARIN SODIUM (PORCINE) 5000 UNIT/ML IJ SOLN
5000.0000 [IU] | Freq: Once | INTRAMUSCULAR | Status: AC
  Administered 2015-03-10: 5000 [IU] via SUBCUTANEOUS
  Filled 2015-03-09: qty 1

## 2015-03-09 MED ORDER — CLINDAMYCIN PHOSPHATE 900 MG/50ML IV SOLN
900.0000 mg | INTRAVENOUS | Status: AC
  Administered 2015-03-10: 900 mg via INTRAVENOUS
  Filled 2015-03-09: qty 50

## 2015-03-09 MED ORDER — ALVIMOPAN 12 MG PO CAPS
12.0000 mg | ORAL_CAPSULE | Freq: Once | ORAL | Status: AC
  Administered 2015-03-10: 12 mg via ORAL
  Filled 2015-03-09: qty 1

## 2015-03-09 MED ORDER — CHLORHEXIDINE GLUCONATE CLOTH 2 % EX PADS: 6.0000 | MEDICATED_PAD | Freq: Once | CUTANEOUS | Status: DC

## 2015-03-09 MED ORDER — GENTAMICIN SULFATE 40 MG/ML IJ SOLN
5.0000 mg/kg | INTRAVENOUS | Status: AC
  Administered 2015-03-10: 550 mg via INTRAVENOUS
  Filled 2015-03-09: qty 13.75

## 2015-03-10 ENCOUNTER — Inpatient Hospital Stay (HOSPITAL_COMMUNITY)
Admission: RE | Admit: 2015-03-10 | Discharge: 2015-03-14 | DRG: 331 | Disposition: A | Discharge status: Home / Self Care | Payer: BLUE CROSS/BLUE SHIELD | Source: Ambulatory Visit | Attending: General Surgery | Admitting: General Surgery

## 2015-03-10 ENCOUNTER — Encounter (HOSPITAL_COMMUNITY)
Admission: RE | Disposition: A | Discharge status: Home / Self Care | Payer: Self-pay | Source: Ambulatory Visit | Attending: General Surgery

## 2015-03-10 ENCOUNTER — Inpatient Hospital Stay (HOSPITAL_COMMUNITY): Payer: BLUE CROSS/BLUE SHIELD | Admitting: Anesthesiology

## 2015-03-10 ENCOUNTER — Encounter (HOSPITAL_COMMUNITY): Payer: Self-pay | Admitting: *Deleted

## 2015-03-10 DIAGNOSIS — Z9049 Acquired absence of other specified parts of digestive tract: Secondary | ICD-10-CM

## 2015-03-10 DIAGNOSIS — Z6832 Body mass index (BMI) 32.0-32.9, adult: Secondary | ICD-10-CM

## 2015-03-10 DIAGNOSIS — R1031 Right lower quadrant pain: Secondary | ICD-10-CM | POA: Diagnosis present

## 2015-03-10 DIAGNOSIS — G4730 Sleep apnea, unspecified: Secondary | ICD-10-CM | POA: Diagnosis present

## 2015-03-10 DIAGNOSIS — R1903 Right lower quadrant abdominal swelling, mass and lump: Principal | ICD-10-CM | POA: Diagnosis present

## 2015-03-10 DIAGNOSIS — E119 Type 2 diabetes mellitus without complications: Secondary | ICD-10-CM | POA: Diagnosis present

## 2015-03-10 HISTORY — PX: LAPAROSCOPIC PARTIAL COLECTOMY: SHX5907

## 2015-03-10 LAB — TYPE AND SCREEN
ABO/RH(D): AB POS
Antibody Screen: NEGATIVE

## 2015-03-10 LAB — ABO/RH: ABO/RH(D): AB POS

## 2015-03-10 LAB — GLUCOSE, CAPILLARY: Glucose-Capillary: 82 mg/dL (ref 70–99)

## 2015-03-10 SURGERY — LAPAROSCOPIC PARTIAL COLECTOMY
Anesthesia: General | Site: Abdomen

## 2015-03-10 MED ORDER — FENTANYL CITRATE 0.05 MG/ML IJ SOLN
INTRAMUSCULAR | Status: AC
  Filled 2015-03-10: qty 5

## 2015-03-10 MED ORDER — NEOSTIGMINE METHYLSULFATE 10 MG/10ML IV SOLN
INTRAVENOUS | Status: DC | PRN
  Administered 2015-03-10: 5 mg via INTRAVENOUS

## 2015-03-10 MED ORDER — MIDAZOLAM HCL 2 MG/2ML IJ SOLN
INTRAMUSCULAR | Status: AC
  Filled 2015-03-10: qty 2

## 2015-03-10 MED ORDER — PROPOFOL 10 MG/ML IV BOLUS
INTRAVENOUS | Status: DC | PRN
  Administered 2015-03-10: 200 mg via INTRAVENOUS

## 2015-03-10 MED ORDER — ONDANSETRON HCL 4 MG/2ML IJ SOLN: 4.0000 mg | Freq: Four times a day (QID) | INTRAMUSCULAR | Status: DC | PRN

## 2015-03-10 MED ORDER — LIDOCAINE HCL (CARDIAC) 20 MG/ML IV SOLN
INTRAVENOUS | Status: AC
  Filled 2015-03-10: qty 5

## 2015-03-10 MED ORDER — MORPHINE SULFATE (PF) 1 MG/ML IV SOLN
INTRAVENOUS | Status: DC
  Administered 2015-03-10: 7 mg via INTRAVENOUS
  Administered 2015-03-10: 14:00:00 via INTRAVENOUS
  Administered 2015-03-10: 6 mg via INTRAVENOUS
  Administered 2015-03-11: 14 mg via INTRAVENOUS
  Administered 2015-03-11: 3 mg via INTRAVENOUS
  Administered 2015-03-11: 6 mg via INTRAVENOUS
  Filled 2015-03-10: qty 25

## 2015-03-10 MED ORDER — PROMETHAZINE HCL 25 MG/ML IJ SOLN
INTRAMUSCULAR | Status: AC
  Filled 2015-03-10: qty 1

## 2015-03-10 MED ORDER — ONDANSETRON HCL 4 MG/2ML IJ SOLN
INTRAMUSCULAR | Status: AC
  Filled 2015-03-10: qty 2

## 2015-03-10 MED ORDER — SODIUM CHLORIDE 0.9 % IV SOLN
INTRAVENOUS | Status: DC
  Administered 2015-03-10: 19:00:00 via INTRAVENOUS

## 2015-03-10 MED ORDER — PHENYLEPHRINE HCL 10 MG/ML IJ SOLN
INTRAMUSCULAR | Status: DC | PRN
  Administered 2015-03-10: 80 ug via INTRAVENOUS

## 2015-03-10 MED ORDER — 0.9 % SODIUM CHLORIDE (POUR BTL) OPTIME
TOPICAL | Status: DC | PRN
  Administered 2015-03-10: 1000 mL

## 2015-03-10 MED ORDER — ONDANSETRON HCL 4 MG/2ML IJ SOLN
INTRAMUSCULAR | Status: DC | PRN
  Administered 2015-03-10: 4 mg via INTRAVENOUS

## 2015-03-10 MED ORDER — MIDAZOLAM HCL 5 MG/5ML IJ SOLN
INTRAMUSCULAR | Status: DC | PRN
  Administered 2015-03-10: 2 mg via INTRAVENOUS

## 2015-03-10 MED ORDER — NALOXONE HCL 0.4 MG/ML IJ SOLN: 0.4000 mg | INTRAMUSCULAR | Status: DC | PRN

## 2015-03-10 MED ORDER — HYDROMORPHONE HCL 1 MG/ML IJ SOLN
0.2500 mg | INTRAMUSCULAR | Status: DC | PRN
  Administered 2015-03-10 (×4): 0.5 mg via INTRAVENOUS

## 2015-03-10 MED ORDER — ACETAMINOPHEN 650 MG RE SUPP: 650.0000 mg | Freq: Four times a day (QID) | RECTAL | Status: DC | PRN

## 2015-03-10 MED ORDER — HYDROMORPHONE HCL 1 MG/ML IJ SOLN
INTRAMUSCULAR | Status: AC
  Administered 2015-03-10: 0.5 mg via INTRAVENOUS
  Filled 2015-03-10: qty 2

## 2015-03-10 MED ORDER — MORPHINE SULFATE (PF) 1 MG/ML IV SOLN
INTRAVENOUS | Status: AC
  Filled 2015-03-10: qty 25

## 2015-03-10 MED ORDER — SODIUM CHLORIDE 0.9 % IR SOLN: Status: DC | PRN

## 2015-03-10 MED ORDER — OXYCODONE HCL 5 MG/5ML PO SOLN: 5.0000 mg | Freq: Once | ORAL | Status: AC | PRN

## 2015-03-10 MED ORDER — KETOROLAC TROMETHAMINE 15 MG/ML IJ SOLN
15.0000 mg | Freq: Three times a day (TID) | INTRAMUSCULAR | Status: DC | PRN
  Administered 2015-03-10 – 2015-03-13 (×5): 15 mg via INTRAVENOUS
  Filled 2015-03-10 (×4): qty 1

## 2015-03-10 MED ORDER — ACETAMINOPHEN 325 MG PO TABS: 650.0000 mg | ORAL_TABLET | Freq: Four times a day (QID) | ORAL | Status: DC | PRN

## 2015-03-10 MED ORDER — FENTANYL CITRATE 0.05 MG/ML IJ SOLN
INTRAMUSCULAR | Status: DC | PRN
  Administered 2015-03-10 (×2): 100 ug via INTRAVENOUS
  Administered 2015-03-10: 50 ug via INTRAVENOUS
  Administered 2015-03-10: 100 ug via INTRAVENOUS
  Administered 2015-03-10: 150 ug via INTRAVENOUS
  Administered 2015-03-10 (×3): 50 ug via INTRAVENOUS
  Administered 2015-03-10: 100 ug via INTRAVENOUS

## 2015-03-10 MED ORDER — GLYCOPYRROLATE 0.2 MG/ML IJ SOLN
INTRAMUSCULAR | Status: DC | PRN
  Administered 2015-03-10: .8 mg via INTRAVENOUS

## 2015-03-10 MED ORDER — ALVIMOPAN 12 MG PO CAPS
12.0000 mg | ORAL_CAPSULE | Freq: Two times a day (BID) | ORAL | Status: DC
  Administered 2015-03-11 – 2015-03-13 (×6): 12 mg via ORAL
  Filled 2015-03-10 (×6): qty 1

## 2015-03-10 MED ORDER — SODIUM CHLORIDE 0.9 % IR SOLN
Status: DC | PRN
  Administered 2015-03-10 (×2): 1000 mL

## 2015-03-10 MED ORDER — DIPHENHYDRAMINE HCL 12.5 MG/5ML PO ELIX: 12.5000 mg | ORAL_SOLUTION | Freq: Four times a day (QID) | ORAL | Status: DC | PRN

## 2015-03-10 MED ORDER — BUPIVACAINE HCL (PF) 0.25 % IJ SOLN
INTRAMUSCULAR | Status: DC | PRN
  Administered 2015-03-10: 9 mL

## 2015-03-10 MED ORDER — ENOXAPARIN SODIUM 40 MG/0.4ML ~~LOC~~ SOLN
40.0000 mg | SUBCUTANEOUS | Status: DC
  Administered 2015-03-11 – 2015-03-13 (×3): 40 mg via SUBCUTANEOUS
  Filled 2015-03-10 (×4): qty 0.4

## 2015-03-10 MED ORDER — LACTATED RINGERS IV SOLN
INTRAVENOUS | Status: DC | PRN
  Administered 2015-03-10 (×3): via INTRAVENOUS

## 2015-03-10 MED ORDER — DIPHENHYDRAMINE HCL 50 MG/ML IJ SOLN: 12.5000 mg | Freq: Four times a day (QID) | INTRAMUSCULAR | Status: DC | PRN

## 2015-03-10 MED ORDER — NEOSTIGMINE METHYLSULFATE 10 MG/10ML IV SOLN
INTRAVENOUS | Status: AC
  Filled 2015-03-10: qty 1

## 2015-03-10 MED ORDER — KETOROLAC TROMETHAMINE 15 MG/ML IJ SOLN
INTRAMUSCULAR | Status: AC
  Administered 2015-03-10: 15 mg via INTRAVENOUS
  Filled 2015-03-10: qty 1

## 2015-03-10 MED ORDER — LACTATED RINGERS IV SOLN
INTRAVENOUS | Status: DC
  Administered 2015-03-10: 10:00:00 via INTRAVENOUS

## 2015-03-10 MED ORDER — SODIUM CHLORIDE 0.9 % IJ SOLN: 9.0000 mL | INTRAMUSCULAR | Status: DC | PRN

## 2015-03-10 MED ORDER — GLYCOPYRROLATE 0.2 MG/ML IJ SOLN
INTRAMUSCULAR | Status: AC
  Filled 2015-03-10: qty 4

## 2015-03-10 MED ORDER — ROCURONIUM BROMIDE 100 MG/10ML IV SOLN
INTRAVENOUS | Status: DC | PRN
  Administered 2015-03-10: 10 mg via INTRAVENOUS
  Administered 2015-03-10: 30 mg via INTRAVENOUS
  Administered 2015-03-10: 40 mg via INTRAVENOUS

## 2015-03-10 MED ORDER — ALVIMOPAN 12 MG PO CAPS: 12.0000 mg | ORAL_CAPSULE | Freq: Once | ORAL | Status: DC

## 2015-03-10 MED ORDER — OXYCODONE HCL 5 MG PO TABS
ORAL_TABLET | ORAL | Status: AC
  Administered 2015-03-10: 5 mg via ORAL
  Filled 2015-03-10: qty 1

## 2015-03-10 MED ORDER — BUPIVACAINE-EPINEPHRINE (PF) 0.25% -1:200000 IJ SOLN
INTRAMUSCULAR | Status: AC
  Filled 2015-03-10: qty 30

## 2015-03-10 MED ORDER — LIDOCAINE HCL (CARDIAC) 20 MG/ML IV SOLN
INTRAVENOUS | Status: DC | PRN
  Administered 2015-03-10: 100 mg via INTRAVENOUS

## 2015-03-10 MED ORDER — PROMETHAZINE HCL 25 MG/ML IJ SOLN
6.2500 mg | INTRAMUSCULAR | Status: DC | PRN
  Administered 2015-03-10: 12.5 mg via INTRAVENOUS

## 2015-03-10 MED ORDER — OXYCODONE HCL 5 MG PO TABS
5.0000 mg | ORAL_TABLET | Freq: Once | ORAL | Status: AC | PRN
  Administered 2015-03-10: 5 mg via ORAL

## 2015-03-10 SURGICAL SUPPLY — 82 items
APPLIER CLIP 5 13 M/L LIGAMAX5 (MISCELLANEOUS)
APR CLP MED LRG 5 ANG JAW (MISCELLANEOUS)
BLADE SURG ROTATE 9660 (MISCELLANEOUS) ×1 IMPLANT
CANISTER SUCTION 2500CC (MISCELLANEOUS) ×2 IMPLANT
CELLS DAT CNTRL 66122 CELL SVR (MISCELLANEOUS) IMPLANT
CHLORAPREP W/TINT 26ML (MISCELLANEOUS) ×2 IMPLANT
CLIP APPLIE 5 13 M/L LIGAMAX5 (MISCELLANEOUS) IMPLANT
COVER MAYO STAND STRL (DRAPES) ×2 IMPLANT
COVER SURGICAL LIGHT HANDLE (MISCELLANEOUS) ×4 IMPLANT
DRAPE LAPAROSCOPIC ABDOMINAL (DRAPES) ×2 IMPLANT
DRAPE PROXIMA HALF (DRAPES) IMPLANT
DRAPE UTILITY XL STRL (DRAPES) ×7 IMPLANT
DRAPE WARM FLUID 44X44 (DRAPE) ×2 IMPLANT
DRSG OPSITE POSTOP 4X10 (GAUZE/BANDAGES/DRESSINGS) IMPLANT
DRSG OPSITE POSTOP 4X8 (GAUZE/BANDAGES/DRESSINGS) ×1 IMPLANT
DRSG TEGADERM 2-3/8X2-3/4 SM (GAUZE/BANDAGES/DRESSINGS) ×4 IMPLANT
ELECT BLADE 6.5 EXT (BLADE) ×2 IMPLANT
ELECT CAUTERY BLADE 6.4 (BLADE) ×4 IMPLANT
ELECT REM PT RETURN 9FT ADLT (ELECTROSURGICAL) ×2
ELECTRODE REM PT RTRN 9FT ADLT (ELECTROSURGICAL) ×1 IMPLANT
GAUZE SPONGE 2X2 8PLY STRL LF (GAUZE/BANDAGES/DRESSINGS) IMPLANT
GEL ULTRASOUND 20GR AQUASONIC (MISCELLANEOUS) IMPLANT
GLOVE BIO SURGEON STRL SZ7 (GLOVE) ×4 IMPLANT
GLOVE BIOGEL PI IND STRL 7.0 (GLOVE) IMPLANT
GLOVE BIOGEL PI IND STRL 7.5 (GLOVE) ×2 IMPLANT
GLOVE BIOGEL PI IND STRL 8 (GLOVE) IMPLANT
GLOVE BIOGEL PI INDICATOR 7.0 (GLOVE) ×6
GLOVE BIOGEL PI INDICATOR 7.5 (GLOVE) ×6
GLOVE BIOGEL PI INDICATOR 8 (GLOVE) ×1
GLOVE ECLIPSE 7.5 STRL STRAW (GLOVE) ×2 IMPLANT
GLOVE ECLIPSE 8.0 STRL XLNG CF (GLOVE) ×2 IMPLANT
GOWN STRL REUS W/ TWL LRG LVL3 (GOWN DISPOSABLE) ×8 IMPLANT
GOWN STRL REUS W/TWL LRG LVL3 (GOWN DISPOSABLE) ×14
KIT BASIN OR (CUSTOM PROCEDURE TRAY) ×2 IMPLANT
KIT ROOM TURNOVER OR (KITS) ×2 IMPLANT
LEGGING LITHOTOMY PAIR STRL (DRAPES) IMPLANT
LIGASURE IMPACT 36 18CM CVD LR (INSTRUMENTS) ×1 IMPLANT
LIQUID BAND (GAUZE/BANDAGES/DRESSINGS) ×1 IMPLANT
NS IRRIG 1000ML POUR BTL (IV SOLUTION) ×5 IMPLANT
PAD ARMBOARD 7.5X6 YLW CONV (MISCELLANEOUS) ×4 IMPLANT
PENCIL BUTTON HOLSTER BLD 10FT (ELECTRODE) ×4 IMPLANT
RELOAD PROXIMATE 75MM BLUE (ENDOMECHANICALS) ×6 IMPLANT
RELOAD STAPLE 75 3.8 BLU REG (ENDOMECHANICALS) IMPLANT
RETRACTOR WND ALEXIS 18 MED (MISCELLANEOUS) IMPLANT
RTRCTR WOUND ALEXIS 18CM MED (MISCELLANEOUS)
SCALPEL HARMONIC ACE (MISCELLANEOUS) ×2 IMPLANT
SCISSORS LAP 5X35 DISP (ENDOMECHANICALS) ×2 IMPLANT
SET IRRIG TUBING LAPAROSCOPIC (IRRIGATION / IRRIGATOR) ×1 IMPLANT
SLEEVE ENDOPATH XCEL 5M (ENDOMECHANICALS) ×4 IMPLANT
SPECIMEN JAR LARGE (MISCELLANEOUS) ×2 IMPLANT
SPONGE GAUZE 2X2 STER 10/PKG (GAUZE/BANDAGES/DRESSINGS) ×1
SPONGE LAP 18X18 X RAY DECT (DISPOSABLE) ×1 IMPLANT
STAPLER GUN LINEAR PROX 60 (STAPLE) ×1 IMPLANT
STAPLER PROXIMATE 75MM BLUE (STAPLE) ×1 IMPLANT
STAPLER VISISTAT 35W (STAPLE) ×2 IMPLANT
SUCTION POOLE TIP (SUCTIONS) ×2 IMPLANT
SURGILUBE 2OZ TUBE FLIPTOP (MISCELLANEOUS) IMPLANT
SUT MNCRL AB 4-0 PS2 18 (SUTURE) ×1 IMPLANT
SUT PDS AB 1 CTX 36 (SUTURE) ×4 IMPLANT
SUT PROLENE 2 0 CT2 30 (SUTURE) IMPLANT
SUT PROLENE 2 0 KS (SUTURE) IMPLANT
SUT SILK 2 0 (SUTURE) ×2
SUT SILK 2 0 SH CR/8 (SUTURE) ×4 IMPLANT
SUT SILK 2-0 18XBRD TIE 12 (SUTURE) ×1 IMPLANT
SUT SILK 3 0 (SUTURE) ×2
SUT SILK 3 0 SH CR/8 (SUTURE) ×2 IMPLANT
SUT SILK 3-0 18XBRD TIE 12 (SUTURE) ×1 IMPLANT
SUT VIC AB 3-0 SH 8-18 (SUTURE) IMPLANT
SYR BULB IRRIGATION 50ML (SYRINGE) ×1 IMPLANT
SYS LAPSCP GELPORT 120MM (MISCELLANEOUS) ×2
SYSTEM LAPSCP GELPORT 120MM (MISCELLANEOUS) IMPLANT
TOWEL OR 17X26 10 PK STRL BLUE (TOWEL DISPOSABLE) ×3 IMPLANT
TRAY FOLEY CATH 16FRSI W/METER (SET/KITS/TRAYS/PACK) ×2 IMPLANT
TRAY LAPAROSCOPIC (CUSTOM PROCEDURE TRAY) ×2 IMPLANT
TRAY PROCTOSCOPIC FIBER OPTIC (SET/KITS/TRAYS/PACK) IMPLANT
TROCAR XCEL BLUNT TIP 100MML (ENDOMECHANICALS) IMPLANT
TROCAR XCEL NON-BLD 11X100MML (ENDOMECHANICALS) IMPLANT
TROCAR XCEL NON-BLD 5MMX100MML (ENDOMECHANICALS) ×2 IMPLANT
TUBE CONNECTING 12X1/4 (SUCTIONS) ×4 IMPLANT
TUBING FILTER THERMOFLATOR (ELECTROSURGICAL) ×2 IMPLANT
TUBING INSUFFLATION (TUBING) ×1 IMPLANT
YANKAUER SUCT BULB TIP NO VENT (SUCTIONS) ×5 IMPLANT

## 2015-03-10 NOTE — H&P (Signed)
54 yo healthy male who smokes has had right lower quadrant pain for about one month. He does not report any real changes in bms except some constipation. No brbpr, no change in stool caliber. He does have symptoms of BPH. He underwent a colonoscopy with Dr Hilarie Fredrickson that shows a possible mass with overlying clot in the cecum. biopsy was done showing ulcerative changes. he has another benign polyp that was removed. Since then he underwent ct scan that shows a nl appendix, 2.3x3.4x4.1 eccentric cecal mass worrisome for tumor. There is stranding aroung this and some small nodes. He has been on c/f and he states the pain has gotten worse since then.   Other Problems Elbert Ewings, CMA; 02/17/2015 9:44 AM) No pertinent past medical history  Allergies Elbert Ewings, CMA; 02/17/2015 9:45 AM) No Known Drug Allergies03/22/2016  Medication History Elbert Ewings, CMA; 02/17/2015 9:46 AM) Aspirin EC (81MG  Tablet DR, Oral) Active. Ciprofloxacin HCl (500MG  Tablet, Oral) Active. MetroNIDAZOLE (500MG  Tablet, Oral) Active. Rosuvastatin Calcium (40MG  Tablet, Oral) Active. Medications Reconciled  Social History Elbert Ewings, [place]; 02/17/2015 9:44 AM) Alcohol use Occasional alcohol use. Caffeine use Coffee. No drug use Tobacco use Current every day smoker.  Family History Elbert Ewings, [place]; 02/17/2015 9:44 AM) Breast Cancer Mother. Colon Cancer Family Members In General. Colon Polyps Family Members In General. Prostate Cancer Family Members In General.  Review of Systems Elbert Ewings CMA; 02/17/2015 9:44 AM) General Not Present- Appetite Loss, Chills, Fatigue, Fever, Night Sweats, Weight Gain and Weight Loss. Skin Not Present- Change in Wart/Mole, Dryness, Hives, Jaundice, New Lesions, Non-Healing Wounds, Rash and Ulcer. HEENT Not Present- Earache, Hearing Loss, Hoarseness, Nose Bleed, Oral Ulcers, Ringing in the Ears, Seasonal Allergies, Sinus Pain, Sore Throat, Visual Disturbances, Wears  glasses/contact lenses and Yellow Eyes. Respiratory Not Present- Bloody sputum, Chronic Cough, Difficulty Breathing, Snoring and Wheezing. Breast Not Present- Breast Mass, Breast Pain, Nipple Discharge and Skin Changes. Cardiovascular Not Present- Chest Pain, Difficulty Breathing Lying Down, Leg Cramps, Palpitations, Rapid Heart Rate, Shortness of Breath and Swelling of Extremities. Gastrointestinal Present- Bloating, Change in Bowel Habits and Constipation. Not Present- Abdominal Pain, Bloody Stool, Chronic diarrhea, Difficulty Swallowing, Excessive gas, Gets full quickly at meals, Hemorrhoids, Indigestion, Nausea, Rectal Pain and Vomiting. Male Genitourinary Present- Frequency and Urine Leakage. Not Present- Blood in Urine, Change in Urinary Stream, Impotence, Nocturia, Painful Urination and Urgency. Musculoskeletal Not Present- Back Pain, Joint Pain, Joint Stiffness, Muscle Pain, Muscle Weakness and Swelling of Extremities. Neurological Not Present- Decreased Memory, Fainting, Headaches, Numbness, Seizures, Tingling, Tremor, Trouble walking and Weakness.   Vitals Elbert Ewings CMA; 02/17/2015 9:46 AM) 02/17/2015 9:46 AM Weight: 233 lb Height: 72in Body Surface Area: 2.32 m Body Mass Index: 31.6 kg/m Temp.: 97.35F(Temporal)  Pulse: 71 (Regular)  Resp.: 18 (Unlabored)  BP: 136/78 (Sitting, Left Arm, Standard) Physical Exam Rolm Bookbinder MD; 02/17/2015 1:31 PM) General Mental Status-Alert. Orientation-Oriented X3. Eye Sclera/Conjunctiva - Bilateral-No scleral icterus. Chest and Lung Exam Chest and lung exam reveals -on auscultation, normal breath sounds, no adventitious sounds and normal vocal resonance. Cardiovascular Cardiovascular examination reveals -normal heart sounds, regular rate and rhythm with no murmurs. Abdomen Note: rlq tender to palpation, supraumbilical scar, bs present, no groin hernia  Assessment & Plan Rolm Bookbinder MD; 02/17/2015 1:32  PM) MASS OF COLON (569.89  K63.9) Story: Laparoscopic assisted right colectomy I think there is enough evidence and concern that he needs surgery for this. I don't think continued conservative mgt as there certainly is the possibility there is a malignancy.  I recommended to him a right colectomy. We discussed risks/hospital stay/time off work. Risks include but are not limited to bleeding, infection, anastomotic leak, reoperation, dvt/pe etc. I will give him flomax five days prior to surgery also to decreased risk of urinary retention.

## 2015-03-10 NOTE — Anesthesia Preprocedure Evaluation (Addendum)
Anesthesia Evaluation  Patient identified by MRN, date of birth, ID band Patient awake    Reviewed: Allergy & Precautions, NPO status , Patient's Chart, lab work & pertinent test results  Airway Mallampati: III  TM Distance: >3 FB Neck ROM: Full    Dental  (+) Teeth Intact, Dental Advisory Given   Pulmonary sleep apnea , Current Smoker,  breath sounds clear to auscultation        Cardiovascular Exercise Tolerance: Good METS: 7 - 9 Mets + CAD Rhythm:Regular Rate:Normal  11/2014- Low risk myoview   Neuro/Psych Cervical radiculopathy  Neuromuscular disease    GI/Hepatic Neg liver ROS, Colon mass   Endo/Other  diabetesMorbid obesity  Renal/GU Renal InsufficiencyRenal disease     Musculoskeletal negative musculoskeletal ROS (+)   Abdominal   Peds  Hematology negative hematology ROS (+)   Anesthesia Other Findings   Reproductive/Obstetrics                          Anesthesia Physical Anesthesia Plan  ASA: II  Anesthesia Plan: General   Post-op Pain Management:    Induction: Intravenous  Airway Management Planned: Oral ETT  Additional Equipment:   Intra-op Plan:   Post-operative Plan: Extubation in OR  Informed Consent: I have reviewed the patients History and Physical, chart, labs and discussed the procedure including the risks, benefits and alternatives for the proposed anesthesia with the patient or authorized representative who has indicated his/her understanding and acceptance.   Dental advisory given  Plan Discussed with: CRNA  Anesthesia Plan Comments:        Anesthesia Quick Evaluation

## 2015-03-10 NOTE — Transfer of Care (Signed)
Immediate Anesthesia Transfer of Care Note  Patient: [name]  Procedure(s) Performed: Procedure(s): LAPAROSCOPIC ASSISTED RIGHT COLECTOMY (N/A)  Patient Location: PACU  Anesthesia Type:General  Level of Consciousness: awake, alert  and oriented  Airway & Oxygen Therapy: Patient Spontanous Breathing and Patient connected to nasal cannula oxygen  Post-op Assessment: Report given to RN and Post -op Vital signs reviewed and stable  Post vital signs: Reviewed and stable  Last Vitals:  Filed Vitals:   03/10/15 0946  BP: 143/85  Pulse: 65  Temp: 36.5 C  Resp: 20    Complications: No apparent anesthesia complications

## 2015-03-10 NOTE — Interval H&P Note (Signed)
History and Physical Interval Note:  03/10/2015 11:11 AM  [name]  has presented today for surgery, with the diagnosis of RIGHT COLON MASS  The various methods of treatment have been discussed with the patient and family. After consideration of risks, benefits and other options for treatment, the patient has consented to  Procedure(s): LAPAROSCOPIC ASSISTED RIGHT COLECTOMY (N/A) as a surgical intervention .  The patient's history has been reviewed, patient examined, no change in status, stable for surgery.  I have reviewed the patient's chart and labs.  Questions were answered to the patient's satisfaction.     [name]

## 2015-03-10 NOTE — Anesthesia Procedure Notes (Signed)
Procedure Name: Intubation Date/Time: 03/10/2015 11:22 AM Performed by: Ollen Bowl Pre-anesthesia Checklist: Patient identified, Emergency Drugs available, Suction available, Patient being monitored and Timeout performed Patient Re-evaluated:Patient Re-evaluated prior to inductionOxygen Delivery Method: Circle system utilized and Simple face mask Preoxygenation: Pre-oxygenation with 100% oxygen Intubation Type: IV induction Ventilation: Mask ventilation with difficulty and Oral airway inserted - appropriate to patient size Laryngoscope Size: Sabra Heck and 2 Grade View: Grade I Tube type: Oral Tube size: 7.5 mm Number of attempts: 1 Airway Equipment and Method: Patient positioned with wedge pillow and Stylet Placement Confirmation: ETT inserted through vocal cords under direct vision,  positive ETCO2 and breath sounds checked- equal and bilateral Secured at: 23 cm Tube secured with: Tape Dental Injury: Teeth and Oropharynx as per pre-operative assessment

## 2015-03-10 NOTE — Op Note (Signed)
Preoperative diagnosis: right colon mass Postoperative diagnosis: same as above Procedure: laparoscopic assisted right colectomy Surgeon: Dr Serita Grammes Asst: Dr Jackolyn Confer Anesthesia: general EBL: 50 cc Complications: none Drains none Specimens: right colon Sponge count correct times two Disposition to recovery stable  Indications: This is a 88 yom who underwent routine screening colonoscopy with finding of a cecal ulcerated mass with overyling clot. This was confirmed by CT scan and concerning for tumor.  He has pain at this site also. I discussed with him proceeding with laparoscopic right colectomy.  Risks were discussed.  Procedure: After informed consent was obtained the patient was taken to the operating room.  SCDs were in place. Antibiotics were given.  He was placed under general anesthesia without complication.  A foley catheter and orogastric tube were placed.  He was then prepped and draped in the standard sterile surgical fashion.  A timeout was performed.  I infiltrated marcaine in the left upper quadrant and used the 5 mm optiview trocar to enter the peritoneal cavity without difficulty or injury.  I then insufflated the abdomen to 15 mm Hg pressure. I then inserted two 5 mm trocars on the left side of the abdomen. I reduced scar tissue that was adherent to his prior umbilical hernia site.  I then inserted a 5 mm trocar in the llq. I identified his cecum, appendix, and the terminal ileum. There was clearly a mass in the cecum that was hard. He also had multiple diverticuli.  I then used the harmonic scalpel to rotate the colon medially and the appendix which was very long.  I then divided the omentum from the transverse colon.  I was able to mobilize the entire hepatic flexure and medialize the colon. The duodenum was visualized.  I then inserted a hand port and completed the dissection.  I then exteriorized the colon. I divided the small bowel with the gia stapler. I then  divided the transverse colon also. I then divided the mesentery with the ligasure device and sutures for the vessels This was then passed off the table. Hemostasis was obtained. I closed the mesenteric defect with silk suture. I then approximated the small bowel and transverse colon. I made enterotomies and then created an anastomosis with a gia stapler.  I then closed the common enterotomy with a tx stapler. I oversewed the bleeding areas.  The anastomosis was hemostatic prior to this. It was patent upon completion. I placed 2 2-0 crotch silk stitches.  I then placed this in the abdomen.  I irrigated. We observed the colon protocol and changed gowns and redraped.  I then closed the handport incision with 2-0 pds suture and stapled this.  I then reinsufflated the abdomen and there was not any evidence bleeding and the anastomosis looked well. I then desufflated the abdomen and removed the trocars. These were closed with staples.  Dressings were placed. He was extubated and transferred to recovery stable.

## 2015-03-11 ENCOUNTER — Encounter (HOSPITAL_COMMUNITY): Payer: Self-pay | Admitting: General Surgery

## 2015-03-11 LAB — BASIC METABOLIC PANEL
Anion gap: 9 (ref 5–15)
BUN: 11 mg/dL (ref 6–23)
CALCIUM: 8 mg/dL — AB (ref 8.4–10.5)
CO2: 26 mmol/L (ref 19–32)
Chloride: 106 mmol/L (ref 96–112)
Creatinine, Ser: 1.29 mg/dL (ref 0.50–1.35)
GFR calc Af Amer: 73 mL/min — ABNORMAL LOW (ref >90)
GFR calc non Af Amer: 63 mL/min — ABNORMAL LOW (ref >90)
GLUCOSE: 104 mg/dL — AB (ref 70–99)
Potassium: 3.9 mmol/L (ref 3.5–5.1)
Sodium: 141 mmol/L (ref 135–145)

## 2015-03-11 LAB — CBC
HCT: 37.6 % — ABNORMAL LOW (ref 39.0–52.0)
HEMOGLOBIN: 12.6 g/dL — AB (ref 13.0–17.0)
MCH: 27.2 pg (ref 26.0–34.0)
MCHC: 33.5 g/dL (ref 30.0–36.0)
MCV: 81 fL (ref 78.0–100.0)
Platelets: 215 10*3/uL (ref 150–400)
RBC: 4.64 MIL/uL (ref 4.22–5.81)
RDW: 13.5 % (ref 11.5–15.5)
WBC: 11.6 10*3/uL — ABNORMAL HIGH (ref 4.0–10.5)

## 2015-03-11 MED ORDER — HYDROMORPHONE 0.3 MG/ML IV SOLN
INTRAVENOUS | Status: DC
  Administered 2015-03-11: 1.5 mg via INTRAVENOUS
  Administered 2015-03-11 (×2): 1.2 mg via INTRAVENOUS
  Administered 2015-03-11: 10:00:00 via INTRAVENOUS
  Administered 2015-03-12: 1.2 mg via INTRAVENOUS
  Administered 2015-03-12: 2.4 mg via INTRAVENOUS
  Administered 2015-03-12: 1.2 mg via INTRAVENOUS
  Filled 2015-03-11 (×2): qty 25

## 2015-03-11 MED ORDER — DIPHENHYDRAMINE HCL 50 MG/ML IJ SOLN
12.5000 mg | Freq: Four times a day (QID) | INTRAMUSCULAR | Status: DC | PRN
  Administered 2015-03-11: 12.5 mg via INTRAVENOUS
  Filled 2015-03-11: qty 1

## 2015-03-11 MED ORDER — NALOXONE HCL 0.4 MG/ML IJ SOLN: 0.4000 mg | INTRAMUSCULAR | Status: DC | PRN

## 2015-03-11 MED ORDER — ACETAMINOPHEN 325 MG PO TABS
650.0000 mg | ORAL_TABLET | Freq: Four times a day (QID) | ORAL | Status: DC | PRN
  Administered 2015-03-12: 650 mg via ORAL
  Filled 2015-03-11 (×2): qty 2

## 2015-03-11 MED ORDER — KCL IN DEXTROSE-NACL 20-5-0.45 MEQ/L-%-% IV SOLN
INTRAVENOUS | Status: DC
Start: 2015-03-11 — End: 2015-03-14
  Administered 2015-03-11 – 2015-03-12 (×4): via INTRAVENOUS
  Filled 2015-03-11 (×5): qty 1000

## 2015-03-11 MED ORDER — DIPHENHYDRAMINE HCL 12.5 MG/5ML PO ELIX: 12.5000 mg | ORAL_SOLUTION | Freq: Four times a day (QID) | ORAL | Status: DC | PRN

## 2015-03-11 MED ORDER — ONDANSETRON HCL 4 MG/2ML IJ SOLN: 4.0000 mg | Freq: Four times a day (QID) | INTRAMUSCULAR | Status: DC | PRN

## 2015-03-11 MED ORDER — SODIUM CHLORIDE 0.9 % IJ SOLN: 9.0000 mL | INTRAMUSCULAR | Status: DC | PRN

## 2015-03-11 NOTE — Progress Notes (Signed)
1 Day Post-Op  Subjective: Pain fair control, no n/v  Objective: Vital signs in last 24 hours: Temp:  [97.7 F (36.5 C)-99.3 F (37.4 C)] 98.4 F (36.9 C) (04/13 0601) Pulse Rate:  [59-156] 85 (04/13 0601) Resp:  [8-20] 18 (04/13 0743) BP: (125-144)/(67-92) 142/67 mmHg (04/13 0601) SpO2:  [95 %-100 %] 97 % (04/13 0743) Weight:  [106.777 kg (235 lb 6.4 oz)-107.2 kg (236 lb 5.3 oz)] 107.2 kg (236 lb 5.3 oz) (04/12 1734)    Intake/Output from previous day: 04/12 0701 - 04/13 0700 In: 3730 [P.O.:580; I.V.:3150] Out: 2425 [Urine:2325; Blood:100] Intake/Output this shift:    General appearance: no distress Resp: clear to auscultation bilaterally Cardio: regular rate and rhythm GI: mild distended wounds clean  Lab Results:   Recent Labs  03/11/15 0415  WBC 11.6*  HGB 12.6*  HCT 37.6*  PLT 215   BMET  Recent Labs  03/11/15 0415  NA 141  K 3.9  CL 106  CO2 26  GLUCOSE 104*  BUN 11  CREATININE 1.29  CALCIUM 8.0*    Anti-infectives: Anti-infectives    Start     Dose/Rate Route Frequency Ordered Stop   03/09/15 1414  clindamycin (CLEOCIN) IVPB 900 mg     900 mg 100 mL/hr over 30 Minutes Intravenous 60 min pre-op 03/09/15 1414 03/10/15 1127   03/09/15 1414  gentamicin (GARAMYCIN) 550 mg in dextrose 5 % 100 mL IVPB     5 mg/kg  109.3 kg 113.8 mL/hr over 60 Minutes Intravenous 60 min pre-op 03/09/15 1414 03/10/15 1155      Assessment/Plan: POD 1 lap assist right colectomy  1. Will change pca, tylenol and toradol prn (will recheck cr in am tomorrow) 2. pulm toilet oob 3. Continue clears 4. Lovenox, scds   [facility] 03/11/2015

## 2015-03-11 NOTE — Anesthesia Postprocedure Evaluation (Signed)
  Anesthesia Post-op Note  Patient: [name]  Procedure(s) Performed: Procedure(s): LAPAROSCOPIC ASSISTED RIGHT COLECTOMY (N/A)  Patient Location: PACU  Anesthesia Type:General  Level of Consciousness: awake and alert   Airway and Oxygen Therapy: Patient Spontanous Breathing  Post-op Pain: mild  Post-op Assessment: Post-op Vital signs reviewed  Post-op Vital Signs: Reviewed  Last Vitals:  Filed Vitals:   03/11/15 1206  BP:   Pulse:   Temp:   Resp: 19    Complications: No apparent anesthesia complications

## 2015-03-12 LAB — BASIC METABOLIC PANEL
ANION GAP: 7 (ref 5–15)
BUN: 9 mg/dL (ref 6–23)
CALCIUM: 8.3 mg/dL — AB (ref 8.4–10.5)
CO2: 31 mmol/L (ref 19–32)
Chloride: 101 mmol/L (ref 96–112)
Creatinine, Ser: 1.04 mg/dL (ref 0.50–1.35)
GFR calc Af Amer: >90 mL/min (ref >90)
GFR calc non Af Amer: 81 mL/min — ABNORMAL LOW (ref >90)
GLUCOSE: 115 mg/dL — AB (ref 70–99)
Potassium: 3.9 mmol/L (ref 3.5–5.1)
Sodium: 139 mmol/L (ref 135–145)

## 2015-03-12 MED ORDER — HYDROMORPHONE HCL 1 MG/ML IJ SOLN
0.5000 mg | INTRAMUSCULAR | Status: DC | PRN
  Administered 2015-03-13: 1 mg via INTRAVENOUS
  Filled 2015-03-12: qty 1

## 2015-03-12 NOTE — Progress Notes (Signed)
2 Days Post-Op  Subjective: No flatus or bm, doesn't like bed, no n/v, up some  Objective: Vital signs in last 24 hours: Temp:  [98.4 F (36.9 C)-98.7 F (37.1 C)] 98.7 F (37.1 C) (04/14 0640) Pulse Rate:  [68-95] 80 (04/14 0640) Resp:  [17-25] 18 (04/14 0738) BP: (125-145)/(62-95) 145/87 mmHg (04/14 0640) SpO2:  [94 %-98 %] 98 % (04/14 0738) Last BM Date: 03/09/15 (Patient states 11th or 10th, before he came to hospital)  Intake/Output from previous day: 04/13 0701 - 04/14 0700 In: 2188.7 [P.O.:120; I.V.:2068.7] Out: 1200 [Urine:1200] Intake/Output this shift:    General appearance: no distress Resp: diminished breath sounds bibasilar Cardio: regular rate and rhythm GI: no bs, mild distended, dressings dry  Lab Results:   Recent Labs  03/11/15 0415  WBC 11.6*  HGB 12.6*  HCT 37.6*  PLT 215   BMET  Recent Labs  03/11/15 0415 03/12/15 0410  NA 141 139  K 3.9 3.9  CL 106 101  CO2 26 31  GLUCOSE 104* 115*  BUN 11 9  CREATININE 1.29 1.04  CALCIUM 8.0* 8.3*   PT/INR No results for input(s): LABPROT, INR in the last 72 hours. ABG No results for input(s): PHART, HCO3 in the last 72 hours.  Invalid input(s): PCO2, PO2  Studies/Results: No results found.  Anti-infectives: Anti-infectives    Start     Dose/Rate Route Frequency Ordered Stop   03/09/15 1414  clindamycin (CLEOCIN) IVPB 900 mg     900 mg 100 mL/hr over 30 Minutes Intravenous 60 min pre-op 03/09/15 1414 03/10/15 1127   03/09/15 1414  gentamicin (GARAMYCIN) 550 mg in dextrose 5 % 100 mL IVPB     5 mg/kg  109.3 kg 113.8 mL/hr over 60 Minutes Intravenous 60 min pre-op 03/09/15 1414 03/10/15 1155      Assessment/Plan: POD 2 lap right colectomy  1. Will switch off pca to iv meds as it is bothering him, toradol and tylenol to be used also to minimize narcotics, continue entereg 2. pulm toilet, oob 3. Clears until ileus resolves 4. Cr fine, met dtv  5. Lovenox, scds 6. Home when having  bowel function and tol po  Kjirsten Bloodgood 03/12/2015

## 2015-03-13 MED ORDER — DOCUSATE SODIUM 100 MG PO CAPS
100.0000 mg | ORAL_CAPSULE | Freq: Two times a day (BID) | ORAL | Status: DC
Start: 2015-03-13 — End: 2015-03-14
  Administered 2015-03-13 (×2): 100 mg via ORAL
  Filled 2015-03-13 (×2): qty 1

## 2015-03-13 MED ORDER — OXYCODONE-ACETAMINOPHEN 5-325 MG PO TABS
1.0000 | ORAL_TABLET | ORAL | Status: DC | PRN
  Administered 2015-03-13 – 2015-03-14 (×2): 2 via ORAL
  Filled 2015-03-13 (×2): qty 2

## 2015-03-13 NOTE — Progress Notes (Signed)
3 Days Post-Op  Subjective: Passing flatus, tol clears, walking a lot, pain controlled, no n/v  Objective: Vital signs in last 24 hours: Temp:  [98.7 F (37.1 C)-99.9 F (37.7 C)] 99.9 F (37.7 C) (04/14 2152) Pulse Rate:  [78-84] 82 (04/14 2152) Resp:  [18-20] 19 (04/14 2152) BP: (107-145)/(52-87) 113/72 mmHg (04/14 2152) SpO2:  [91 %-98 %] 97 % (04/14 2152) Last BM Date: 03/09/15  Intake/Output from previous day: 04/14 0701 - 04/15 0700 In: 2010 [P.O.:820; I.V.:1190] Out: 1850 [Urine:1850] Intake/Output this shift: Total I/O In: 811.3 [P.O.:220; I.V.:591.3] Out: -   General appearance: no distress Resp: clear to auscultation bilaterally Cardio: regular rate and rhythm GI: soft approp tender bs present incisions clean  Lab Results:   Recent Labs  03/11/15 0415  WBC 11.6*  HGB 12.6*  HCT 37.6*  PLT 215   BMET  Recent Labs  03/11/15 0415 03/12/15 0410  NA 141 139  K 3.9 3.9  CL 106 101  CO2 26 31  GLUCOSE 104* 115*  BUN 11 9  CREATININE 1.29 1.04  CALCIUM 8.0* 8.3*   PT/INR No results for input(s): LABPROT, INR in the last 72 hours. ABG No results for input(s): PHART, HCO3 in the last 72 hours.  Invalid input(s): PCO2, PO2  Studies/Results: No results found.  Anti-infectives: Anti-infectives    Start     Dose/Rate Route Frequency Ordered Stop   03/09/15 1414  clindamycin (CLEOCIN) IVPB 900 mg     900 mg 100 mL/hr over 30 Minutes Intravenous 60 min pre-op 03/09/15 1414 03/10/15 1127   03/09/15 1414  gentamicin (GARAMYCIN) 550 mg in dextrose 5 % 100 mL IVPB     5 mg/kg  109.3 kg 113.8 mL/hr over 60 Minutes Intravenous 60 min pre-op 03/09/15 1414 03/10/15 1155      Assessment/Plan: POD 3 lap right colon  1. Will change to oral pain meds with iv backup 2. Advance diet to fulls then as tolerated 3. Cont ambulation, pulm toilet 4. lovenox scds 5. If does well could go home tomorrow 6. Path benign discussed this  am  [facility] 03/13/2015

## 2015-03-14 MED ORDER — OXYCODONE-ACETAMINOPHEN 10-325 MG PO TABS: 1.0000 | ORAL_TABLET | Freq: Four times a day (QID) | ORAL | Status: DC | PRN

## 2015-03-14 NOTE — Progress Notes (Signed)
4 Days Post-Op  Subjective: Passing flatus, no n/v tol diet, wants to go home  Objective: Vital signs in last 24 hours: Temp:  [98.1 F (36.7 C)-98.6 F (37 C)] 98.1 F (36.7 C) (04/16 0545) Pulse Rate:  [64-75] 64 (04/16 0545) Resp:  [18] 18 (04/16 0545) BP: (123-134)/(69-94) 128/83 mmHg (04/16 0545) SpO2:  [100 %] 100 % (04/16 0545) Last BM Date: 03/09/15  Intake/Output from previous day: 04/15 0701 - 04/16 0700 In: 1400 [P.O.:1400] Out: -  Intake/Output this shift:    GI: soft nd bs present approp tender incisions all without infection  Lab Results:  No results for input(s): WBC, HGB, HCT, PLT in the last 72 hours. BMET  Recent Labs  03/12/15 0410  NA 139  K 3.9  CL 101  CO2 31  GLUCOSE 115*  BUN 9  CREATININE 1.04  CALCIUM 8.3*   PT/INR No results for input(s): LABPROT, INR in the last 72 hours. ABG No results for input(s): PHART, HCO3 in the last 72 hours.  Invalid input(s): PCO2, PO2  Studies/Results: No results found.  Anti-infectives: Anti-infectives    Start     Dose/Rate Route Frequency Ordered Stop   03/09/15 1414  clindamycin (CLEOCIN) IVPB 900 mg     900 mg 100 mL/hr over 30 Minutes Intravenous 60 min pre-op 03/09/15 1414 03/10/15 1127   03/09/15 1414  gentamicin (GARAMYCIN) 550 mg in dextrose 5 % 100 mL IVPB     5 mg/kg  109.3 kg 113.8 mL/hr over 60 Minutes Intravenous 60 min pre-op 03/09/15 1414 03/10/15 1155      Assessment/Plan: POD 4 lap right colon  Will dc home today  [facility] 03/14/2015

## 2015-03-14 NOTE — Discharge Instructions (Signed)
[facility], [place] [phone]  POST OP INSTRUCTIONS  Always review your discharge instruction sheet given to you by the facility where your surgery was performed.  IF YOU HAVE DISABILITY OR FAMILY LEAVE FORMS, YOU MUST BRING THEM TO THE OFFICE FOR PROCESSING.  PLEASE DO NOT GIVE THEM TO YOUR DOCTOR.  1. A prescription for pain medication may be given to you upon discharge.  Take your pain medication as prescribed, if needed.  If narcotic pain medicine is not needed, then you may take acetaminophen (Tylenol) or ibuprofen (Advil) as needed. 2. Take your usually prescribed medications unless otherwise directed. 3. If you need a refill on your pain medication, please contact your pharmacy. They will contact our office to request authorization.  Prescriptions will not be filled after 5pm or on week-ends. 4. You should follow a light diet the first few days after arrival home, such as soup and crackers, pudding, etc.unless your doctor has advised otherwise. A high-fiber, low fat diet can be resumed as tolerated.   Be sure to include lots of fluids daily.  5. Most patients will experience some swelling and bruising in the area of the incision. Ice pack will help. Swelling and bruising can take several days to resolve..  6. It is common to experience some constipation if taking pain medication after surgery.  Increasing fluid intake and taking a stool softener will usually help or prevent this problem from occurring.  A mild laxative (Milk of Magnesia or Miralax) should be taken according to package directions if there are no bowel movements after 48 hours. 7.  You may have steri-strips (small skin tapes) in place directly over the incision.  These strips should be left on the skin for 7-10 days.  If your surgeon used skin glue on the incision, you may shower in 24 hours.  The glue will flake off over the next 2-3 weeks.  Any sutures or staples will be removed at the office during your  follow-up visit. You may find that a light gauze bandage over your incision may keep your staples from being rubbed or pulled. You may shower and replace the bandage daily. 8. ACTIVITIES:  You may resume regular (light) daily activities beginning the next day--such as daily self-care, walking, climbing stairs--gradually increasing activities as tolerated.  You may have sexual intercourse when it is comfortable.  Refrain from any heavy lifting or straining until approved by your doctor. a. You may drive when you no longer are taking prescription pain medication, you can comfortably wear a seatbelt, and you can safely maneuver your car and apply brakes b. Return to Work: ___________________________________ 52. You should see your doctor in the office for a follow-up appointment approximately two weeks after your surgery.  Make sure that you call for this appointment within a day or two after you arrive home to insure a convenient appointment time. OTHER INSTRUCTIONS:  _____________________________________________________________ _____________________________________________________________  WHEN TO CALL YOUR DOCTOR: 1. Fever over 101.0 2. Inability to urinate 3. Nausea and/or vomiting 4. Extreme swelling or bruising 5. Continued bleeding from incision.  The clinic staff is available to answer your questions during regular business hours.  Please dont hesitate to call and ask to speak to one of the nurses if you have concerns.  For further questions, please visit [email or web address]

## 2015-03-14 NOTE — Progress Notes (Signed)
Discharge instructions reviewed and copy given. Prescription given for percocet.

## 2015-03-14 NOTE — Discharge Summary (Signed)
Physician Discharge Summary  Patient ID: EARLIN SWEEDEN MRN: [id number] DOB/AGE: 05-08-1961 54 y.o.  Admit date: 03/10/2015 Discharge date: 03/14/2015  Admission Diagnoses: Right colon mass  Discharge Diagnoses:  Active Problems:   S/P right colectomy   Discharged Condition: good  Hospital Course: 65 yom with possible right colon mass on endoscopy and ct scan.  Taken to the or for laparoscopic assisted right colectomy. Over the following days he began to have bowel function and diet was advanced. His wounds are all clean.  He was discharged home after having flatus and tolerating diet. Path was benign.  Consults: None  Significant Diagnostic Studies: none  Treatments: surgery: lap assist right colon    Disposition: 01-Home or Self Care     Medication List    STOP taking these medications        ciprofloxacin 500 MG tablet  Commonly known as:  CIPRO     metroNIDAZOLE 500 MG tablet  Commonly known as:  FLAGYL      TAKE these medications        aspirin EC 81 MG tablet  Take 81 mg by mouth daily.     MULTIVITAMIN PO  Take 1 tablet by mouth daily.     oxyCODONE-acetaminophen 10-325 MG per tablet  Commonly known as:  PERCOCET  Take 1 tablet by mouth every 6 (six) hours as needed for pain.     rosuvastatin 40 MG tablet  Commonly known as:  CRESTOR  Take 1 tablet (40 mg total) by mouth daily.     UNKNOWN TO PATIENT  Patient finished prescribed preop antibiotic last night unsure name     VISINE OP  Place 2 drops into both eyes as needed (for red eyes).           Follow-up Information    Follow up with [facility], MD In 2 weeks.   Specialty:  General Surgery   Contact information:   [address]  [place] [address] [phone]       Signed: Rolm Bookbinder 03/14/2015, 2:27 PM

## 2015-03-17 ENCOUNTER — Encounter (HOSPITAL_COMMUNITY): Payer: Self-pay | Admitting: *Deleted

## 2015-03-17 ENCOUNTER — Emergency Department (HOSPITAL_COMMUNITY): Payer: BLUE CROSS/BLUE SHIELD

## 2015-03-17 ENCOUNTER — Inpatient Hospital Stay (HOSPITAL_COMMUNITY)
Admission: EM | Admit: 2015-03-17 | Discharge: 2015-03-30 | DRG: 394 | Disposition: A | Discharge status: Home / Self Care | Payer: BLUE CROSS/BLUE SHIELD | Attending: General Surgery | Admitting: General Surgery

## 2015-03-17 DIAGNOSIS — E8881 Metabolic syndrome: Secondary | ICD-10-CM | POA: Diagnosis present

## 2015-03-17 DIAGNOSIS — K9189 Other postprocedural complications and disorders of digestive system: Secondary | ICD-10-CM

## 2015-03-17 DIAGNOSIS — E876 Hypokalemia: Secondary | ICD-10-CM | POA: Diagnosis present

## 2015-03-17 DIAGNOSIS — Z800 Family history of malignant neoplasm of digestive organs: Secondary | ICD-10-CM

## 2015-03-17 DIAGNOSIS — K913 Postprocedural intestinal obstruction: Secondary | ICD-10-CM | POA: Diagnosis not present

## 2015-03-17 DIAGNOSIS — Y838 Other surgical procedures as the cause of abnormal reaction of the patient, or of later complication, without mention of misadventure at the time of the procedure: Secondary | ICD-10-CM | POA: Diagnosis present

## 2015-03-17 DIAGNOSIS — J310 Chronic rhinitis: Secondary | ICD-10-CM | POA: Diagnosis present

## 2015-03-17 DIAGNOSIS — E119 Type 2 diabetes mellitus without complications: Secondary | ICD-10-CM | POA: Diagnosis present

## 2015-03-17 DIAGNOSIS — K567 Ileus, unspecified: Secondary | ICD-10-CM

## 2015-03-17 DIAGNOSIS — E870 Hyperosmolality and hypernatremia: Secondary | ICD-10-CM | POA: Diagnosis present

## 2015-03-17 DIAGNOSIS — Z79899 Other long term (current) drug therapy: Secondary | ICD-10-CM

## 2015-03-17 DIAGNOSIS — Z7982 Long term (current) use of aspirin: Secondary | ICD-10-CM | POA: Diagnosis not present

## 2015-03-17 DIAGNOSIS — D571 Sickle-cell disease without crisis: Secondary | ICD-10-CM | POA: Diagnosis present

## 2015-03-17 DIAGNOSIS — I2510 Atherosclerotic heart disease of native coronary artery without angina pectoris: Secondary | ICD-10-CM | POA: Diagnosis present

## 2015-03-17 DIAGNOSIS — K56609 Unspecified intestinal obstruction, unspecified as to partial versus complete obstruction: Secondary | ICD-10-CM

## 2015-03-17 DIAGNOSIS — A047 Enterocolitis due to Clostridium difficile: Secondary | ICD-10-CM | POA: Diagnosis not present

## 2015-03-17 DIAGNOSIS — G4733 Obstructive sleep apnea (adult) (pediatric): Secondary | ICD-10-CM | POA: Diagnosis present

## 2015-03-17 DIAGNOSIS — Z9049 Acquired absence of other specified parts of digestive tract: Secondary | ICD-10-CM | POA: Diagnosis present

## 2015-03-17 DIAGNOSIS — F17210 Nicotine dependence, cigarettes, uncomplicated: Secondary | ICD-10-CM | POA: Diagnosis present

## 2015-03-17 DIAGNOSIS — Z79891 Long term (current) use of opiate analgesic: Secondary | ICD-10-CM

## 2015-03-17 DIAGNOSIS — E780 Pure hypercholesterolemia: Secondary | ICD-10-CM | POA: Diagnosis present

## 2015-03-17 DIAGNOSIS — N138 Other obstructive and reflux uropathy: Secondary | ICD-10-CM | POA: Diagnosis present

## 2015-03-17 DIAGNOSIS — Z4659 Encounter for fitting and adjustment of other gastrointestinal appliance and device: Secondary | ICD-10-CM

## 2015-03-17 DIAGNOSIS — E878 Other disorders of electrolyte and fluid balance, not elsewhere classified: Secondary | ICD-10-CM | POA: Diagnosis present

## 2015-03-17 HISTORY — DX: Sickle-cell disease without crisis: D57.1

## 2015-03-17 HISTORY — DX: Malignant (primary) neoplasm, unspecified: C80.1

## 2015-03-17 LAB — CBC WITH DIFFERENTIAL/PLATELET
BASOS ABS: 0.1 10*3/uL (ref 0.0–0.1)
BASOS PCT: 1 % (ref 0–1)
EOS ABS: 0.1 10*3/uL (ref 0.0–0.7)
EOS PCT: 3 % (ref 0–5)
HCT: 37.5 % — ABNORMAL LOW (ref 39.0–52.0)
HEMOGLOBIN: 13 g/dL (ref 13.0–17.0)
LYMPHS ABS: 0.8 10*3/uL (ref 0.7–4.0)
LYMPHS PCT: 21 % (ref 12–46)
MCH: 27.5 pg (ref 26.0–34.0)
MCHC: 34.7 g/dL (ref 30.0–36.0)
MCV: 79.3 fL (ref 78.0–100.0)
Monocytes Absolute: 0.8 10*3/uL (ref 0.1–1.0)
Monocytes Relative: 21 % — ABNORMAL HIGH (ref 3–12)
NEUTROS ABS: 2 10*3/uL (ref 1.7–7.7)
NEUTROS PCT: 54 % (ref 43–77)
Platelets: 314 10*3/uL (ref 150–400)
RBC: 4.73 MIL/uL (ref 4.22–5.81)
RDW: 13.3 % (ref 11.5–15.5)
WBC: 3.7 10*3/uL — ABNORMAL LOW (ref 4.0–10.5)

## 2015-03-17 LAB — I-STAT CHEM 8, ED
BUN: 14 mg/dL (ref 6–23)
CHLORIDE: 100 mmol/L (ref 96–112)
Calcium, Ion: 0.98 mmol/L — ABNORMAL LOW (ref 1.12–1.23)
Creatinine, Ser: 1.1 mg/dL (ref 0.50–1.35)
Glucose, Bld: 111 mg/dL — ABNORMAL HIGH (ref 70–99)
HEMATOCRIT: 41 % (ref 39.0–52.0)
Hemoglobin: 13.9 g/dL (ref 13.0–17.0)
POTASSIUM: 4 mmol/L (ref 3.5–5.1)
Sodium: 139 mmol/L (ref 135–145)
TCO2: 25 mmol/L (ref 0–100)

## 2015-03-17 LAB — BASIC METABOLIC PANEL
ANION GAP: 12 (ref 5–15)
BUN: 13 mg/dL (ref 6–23)
CO2: 27 mmol/L (ref 19–32)
CREATININE: 1.07 mg/dL (ref 0.50–1.35)
Calcium: 8.5 mg/dL (ref 8.4–10.5)
Chloride: 99 mmol/L (ref 96–112)
GFR calc Af Amer: >90 mL/min (ref >90)
GFR calc non Af Amer: 79 mL/min — ABNORMAL LOW (ref >90)
GLUCOSE: 111 mg/dL — AB (ref 70–99)
Potassium: 4 mmol/L (ref 3.5–5.1)
SODIUM: 138 mmol/L (ref 135–145)

## 2015-03-17 LAB — I-STAT CG4 LACTIC ACID, ED: Lactic Acid, Venous: 1.44 mmol/L (ref 0.5–2.0)

## 2015-03-17 MED ORDER — DEXTROSE-NACL 5-0.9 % IV SOLN
INTRAVENOUS | Status: DC
  Administered 2015-03-17 – 2015-03-20 (×7): via INTRAVENOUS

## 2015-03-17 MED ORDER — SODIUM CHLORIDE 0.9 % IV BOLUS (SEPSIS)
1000.0000 mL | Freq: Once | INTRAVENOUS | Status: AC
  Administered 2015-03-17: 1000 mL via INTRAVENOUS

## 2015-03-17 MED ORDER — ENOXAPARIN SODIUM 40 MG/0.4ML ~~LOC~~ SOLN
40.0000 mg | SUBCUTANEOUS | Status: DC
  Administered 2015-03-18 – 2015-03-30 (×13): 40 mg via SUBCUTANEOUS
  Filled 2015-03-17 (×11): qty 0.4

## 2015-03-17 MED ORDER — IOHEXOL 300 MG/ML  SOLN
100.0000 mL | Freq: Once | INTRAMUSCULAR | Status: AC | PRN
  Administered 2015-03-17: 100 mL via INTRAVENOUS

## 2015-03-17 MED ORDER — HYDROMORPHONE HCL 1 MG/ML IJ SOLN
1.0000 mg | Freq: Once | INTRAMUSCULAR | Status: AC
  Administered 2015-03-17: 1 mg via INTRAVENOUS
  Filled 2015-03-17: qty 1

## 2015-03-17 MED ORDER — ONDANSETRON HCL 4 MG/2ML IJ SOLN
4.0000 mg | Freq: Once | INTRAMUSCULAR | Status: AC
  Administered 2015-03-17: 4 mg via INTRAVENOUS
  Filled 2015-03-17: qty 2

## 2015-03-17 MED ORDER — ONDANSETRON HCL 4 MG/2ML IJ SOLN
4.0000 mg | Freq: Four times a day (QID) | INTRAMUSCULAR | Status: DC | PRN
  Administered 2015-03-17: 4 mg via INTRAVENOUS
  Filled 2015-03-17: qty 2

## 2015-03-17 MED ORDER — HYDROMORPHONE HCL 1 MG/ML IJ SOLN
1.0000 mg | INTRAMUSCULAR | Status: DC | PRN
  Administered 2015-03-17 – 2015-03-20 (×12): 1 mg via INTRAVENOUS
  Filled 2015-03-17 (×12): qty 1

## 2015-03-17 NOTE — ED Notes (Signed)
Please keep mother Noreene Filbert updated  [phone] home, [phone] cell.

## 2015-03-17 NOTE — ED Provider Notes (Signed)
CSN: [id number]     Arrival date & time 03/17/15  1705 History   First MD Initiated Contact with Patient 03/17/15 1725     Chief Complaint  Patient presents with  . Abdominal Pain  . Post-op Problem   [name] is a 54 y.o. male with a history of CAD, and recent laparoscopic right partial colectomy for a colon mass on 03/10/15 who presents to the ED complaining of abdominal pain, abdominal distention, nausea, and vomiting since he was discharged 4 days ago. The patient reports he has been able to keep down liquids but has been unable to keep down solid food. He reports vomiting once this morning at 4 AM. He reports he did pass a small amount of gas yesterday but none today. He does report having a small bowel movement early this morning but he reports that his abdomen has become more distended and hard over the past several days. Planes of 6 out of 10 right sided abdominal pain that he describes as a pressure. He reports taking Percocet and ibuprofen for his pain. He reports taking MiraLAX intermittently since discharge. He reports chills and subjective fever. His partial right colectomy was done by Dr. Donne Hazel on 03/10/15. The patient denies hematemesis, urinary symptoms, hematochezia, or rashes.   (Consider location/radiation/quality/duration/timing/severity/associated sxs/prior Treatment) HPI  Past Medical History  Diagnosis Date  . Coronary atherosclerosis of unspecified type of vessel, native or graft   . Pure hypercholesterolemia   . Tobacco use disorder   . Obstructive sleep apnea (adult) (pediatric)   . Problems related to high-risk sexual behavior   . Cellulitis and abscess of unspecified site   . Dysmetabolic syndrome X   . Erectile dysfunction   . Chronic rhinitis   . Hypertrophy of prostate with urinary obstruction and other lower urinary tract symptoms (LUTS)   . Family history of malignant neoplasm of gastrointestinal tract   . Complication of anesthesia     TOOK AWHILE  TO GO TO SLEEP    Past Surgical History  Procedure Laterality Date  . Ptca    . Incision and drainage abscess / hematoma of bursa / knee / thigh      I&D of complex abscess,left axilla. I&D of large infected pilonidal abscess  . Cosmetic surgery    . Hernia repair      UHR  AT [facility]  <5 YRS AGO   . Laparoscopic partial right colectomy Right 03/10/2015  . Colon surgery    . Laparoscopic partial colectomy N/A 03/10/2015    Procedure: LAPAROSCOPIC ASSISTED RIGHT COLECTOMY;  Surgeon: Rolm Bookbinder, MD;  Location: [facility] OR;  Service: General;  Laterality: N/A;   Family History  Problem Relation Age of Onset  . Cancer Mother     breast  . Cancer Maternal Grandmother     colon cancer  . Heart disease Maternal Grandfather   . Colon cancer Maternal Grandfather   . Cancer Other     colon cancer 1st degree relative<60  . Heart disease Other   . Esophageal cancer Neg Hx   . Stomach cancer Neg Hx   . Rectal cancer Neg Hx    History  Substance Use Topics  . Smoking status: Current Every Day Smoker -- 1.00 packs/day for 25 years  . Smokeless tobacco: Never Used  . Alcohol Use: 0.0 oz/week    0 Standard drinks or equivalent per week     Comment: rare    Review of Systems  Constitutional: Positive for fever and  chills.  HENT: Negative for congestion, sore throat and trouble swallowing.   Eyes: Negative for visual disturbance.  Respiratory: Negative for cough, shortness of breath and wheezing.   Cardiovascular: Negative for chest pain.  Gastrointestinal: Positive for nausea, vomiting, abdominal pain, constipation and abdominal distention. Negative for diarrhea and blood in stool.  Genitourinary: Negative for dysuria and hematuria.  Musculoskeletal: Negative for back pain and neck pain.  Skin: Negative for rash.  Neurological: Negative for headaches.      Allergies  Penicillins  Home Medications   Prior to Admission medications   Medication Sig Start Date End Date Taking?  Authorizing Provider  aspirin EC 81 MG tablet Take 81 mg by mouth daily.    Historical Provider, MD  Multiple Vitamins-Minerals (MULTIVITAMIN PO) Take 1 tablet by mouth daily.    Historical Provider, MD  oxyCODONE-acetaminophen (PERCOCET) 10-325 MG per tablet Take 1 tablet by mouth every 6 (six) hours as needed for pain. 03/14/15 03/13/16  Rolm Bookbinder, MD  rosuvastatin (CRESTOR) 40 MG tablet Take 1 tablet (40 mg total) by mouth daily. Patient not taking: Reported on 01/12/2015 11/10/14   Janith Lima, MD  Tetrahydrozoline HCl (VISINE OP) Place 2 drops into both eyes as needed (for red eyes).    Historical Provider, MD  UNKNOWN TO PATIENT Patient finished prescribed preop antibiotic last night unsure name    Historical Provider, MD   BP 132/76 mmHg  Pulse 86  Temp(Src) 98.6 F (37 C) (Oral)  Resp 23  Ht 6' (1.829 m)  Wt 230 lb (104.327 kg)  BMI 31.19 kg/m2  SpO2 98% Physical Exam  Constitutional: He is oriented to person, place, and time. He appears well-developed and well-nourished. No distress.  HENT:  Head: Normocephalic and atraumatic.  Mouth/Throat: Oropharynx is clear and moist. No oropharyngeal exudate.  Dry mucous membranes.   Eyes: Conjunctivae are normal. Pupils are equal, round, and reactive to light. Right eye exhibits no discharge. Left eye exhibits no discharge.  Neck: Neck supple.  Cardiovascular: Normal rate, regular rhythm, normal heart sounds and intact distal pulses.  Exam reveals no gallop and no friction rub.   No murmur heard. Bilateral radial, posterior tibialis and dorsalis pedis pulses are intact.   Pulmonary/Chest: Effort normal and breath sounds normal. No respiratory distress. He has no wheezes. He has no rales.  Abdominal: He exhibits distension. There is tenderness.  Abdomen is distended, firm and diffusely tender. No bowel sounds noted.  Staples in place over incision sites and are clean and free of infection.   Musculoskeletal: He exhibits no  edema.  Lymphadenopathy:    He has no cervical adenopathy.  Neurological: He is alert and oriented to person, place, and time. Coordination normal.  Skin: Skin is warm and dry. No rash noted. He is not diaphoretic. No erythema. No pallor.  Psychiatric: He has a normal mood and affect. His behavior is normal.  Nursing note and vitals reviewed.   ED Course  Procedures (including critical care time) Labs Review Labs Reviewed  BASIC METABOLIC PANEL - Abnormal; Notable for the following:    Glucose, Bld 111 (*)    GFR calc non Af Amer 79 (*)    All other components within normal limits  CBC WITH DIFFERENTIAL/PLATELET - Abnormal; Notable for the following:    WBC 3.7 (*)    HCT 37.5 (*)    Monocytes Relative 21 (*)    All other components within normal limits  I-STAT CHEM 8, ED - Abnormal; Notable for  the following:    Glucose, Bld 111 (*)    Calcium, Ion 0.98 (*)    All other components within normal limits  CULTURE, BLOOD (ROUTINE X 2)  CULTURE, BLOOD (ROUTINE X 2)  BASIC METABOLIC PANEL  I-STAT CG4 LACTIC ACID, ED    Imaging Review Ct Abdomen Pelvis W Contrast  03/17/2015   CLINICAL DATA:  Recent colon resection for colon cancer with abdominal pain, distention, nausea, and vomiting.  EXAM: CT ABDOMEN AND PELVIS WITH CONTRAST  TECHNIQUE: Multidetector CT imaging of the abdomen and pelvis was performed using the standard protocol following bolus administration of intravenous contrast.  CONTRAST:  157mL OMNIPAQUE IOHEXOL 300 MG/ML  SOLN  COMPARISON:  01/27/2015  FINDINGS: Subsegmental atelectasis is present in the lung bases. There is no pleural effusion.  The liver, gallbladder, spleen, adrenal glands, kidneys, and pancreas have an unremarkable enhanced appearance.  Sequelae of interval right colectomy are identified. There are multiple fluid-filled loops of moderately dilated small bowel throughout the abdomen measuring up to approximately 5 cm in diameter. There is a transition to a  small amount of decompressed distal small bowel in the right mid abdomen in the region of the anastomosis. The colon is largely decompressed, with scattered diverticulosis noted.  There is a small amount of gas in the bladder, query recent catheterization. A small amount of free fluid is present in the right lower quadrant and pelvis. No intraperitoneal free air is identified. Minimal atherosclerotic vascular calcification is noted. No enlarged lymph nodes are identified. No acute osseous abnormality or destructive osseous lesions are identified.  IMPRESSION: Interval right colectomy. Moderate dilatation of multiple small bowel loops consistent with obstruction, with transition to decompressed distal small bowel in the right mid abdomen near the anastomosis.   Electronically Signed   By: Logan Bores   On: 03/17/2015 21:36     EKG Interpretation None      Filed Vitals:   03/17/15 1709 03/17/15 1712 03/17/15 1725 03/17/15 1745  BP:   126/82 132/76  Pulse:   88 86  Temp:   98.6 F (37 C)   TempSrc:   Oral   Resp:   23 23  Height:  6' (1.829 m)    Weight:  230 lb (104.327 kg)    SpO2: 98%  98% 98%     MDM   Meds given in ED:  Medications  sodium chloride 0.9 % bolus 1,000 mL (not administered)  ondansetron (ZOFRAN) injection 4 mg (not administered)  HYDROmorphone (DILAUDID) injection 1 mg (not administered)    New Prescriptions   No medications on file    Final diagnoses:  Ileus  Encounter for nasogastric (NG) tube placement   This is a 54 y.o. male with a history of CAD, and recent laparoscopic right partial colectomy for a colon mass on 03/10/15 who presents to the ED complaining of abdominal pain, abdominal distention, nausea, and vomiting since he was discharged 4 days ago.  On exam the patient's abdomen is distended and diffusely tender and firm. Will obtain blood work and CT scan of IV contrast. Prior to return of the patients CT scan Dr. Rosendo Gros from general surgery came  and reported that he had seen the patient and CT and will go ahead and put in admission orders for this patient. Patient with bowel obstruction on CT scan. The patient was admitted by Dr. Rosendo Gros.  This patient was discussed with and evaluated by Dr. Johnney Killian who agrees with assessment and plan.  Waynetta Pean, PA-C 03/18/15 0201  Charlesetta Shanks, MD 03/20/15 [phone]

## 2015-03-17 NOTE — ED Notes (Signed)
CT called and informed pt ready.

## 2015-03-17 NOTE — ED Notes (Signed)
Pt arrives from home via [facility]. Pt states he had a colon resection last Tuesday rt colon cancer. Pt states he has only had 2 bm's since the surgery last week and has had anorexia, n/v and abdominal distension.

## 2015-03-17 NOTE — H&P (Signed)
[name] is an 54 y.o. male.   Chief Complaint: Abdominal pain HPI: A 54 year old male who currently is postoperative day #7 from a laparoscopic right hemicolectomy. Patient comes today he states he's had decreased by mouth over the last 4 days. He states he had some nausea and vomiting today. He's had some abdominal distention. He did have a bowel movement earlier today however was minimal.  CT scan revealed distended loops of small bowel. Discussed with the radiologist is concern over potential mechanical obstruction at the anastomosis.  Past Medical History  Diagnosis Date  . Coronary atherosclerosis of unspecified type of vessel, native or graft   . Pure hypercholesterolemia   . Tobacco use disorder   . Obstructive sleep apnea (adult) (pediatric)   . Problems related to high-risk sexual behavior   . Cellulitis and abscess of unspecified site   . Dysmetabolic syndrome X   . Erectile dysfunction   . Chronic rhinitis   . Hypertrophy of prostate with urinary obstruction and other lower urinary tract symptoms (LUTS)   . Family history of malignant neoplasm of gastrointestinal tract   . Complication of anesthesia     TOOK AWHILE TO GO TO SLEEP   . Cancer   . Diabetes mellitus without complication   . Sickle cell anemia     Past Surgical History  Procedure Laterality Date  . Ptca    . Incision and drainage abscess / hematoma of bursa / knee / thigh      I&D of complex abscess,left axilla. I&D of large infected pilonidal abscess  . Cosmetic surgery    . Hernia repair      UHR  AT [facility]  <5 YRS AGO   . Laparoscopic partial right colectomy Right 03/10/2015  . Colon surgery    . Laparoscopic partial colectomy N/A 03/10/2015    Procedure: LAPAROSCOPIC ASSISTED RIGHT COLECTOMY;  Surgeon: Rolm Bookbinder, MD;  Location: [facility] OR;  Service: General;  Laterality: N/A;    Family History  Problem Relation Age of Onset  . Cancer Mother     breast  . Cancer Maternal Grandmother    colon cancer  . Heart disease Maternal Grandfather   . Colon cancer Maternal Grandfather   . Cancer Other     colon cancer 1st degree relative<60  . Heart disease Other   . Esophageal cancer Neg Hx   . Stomach cancer Neg Hx   . Rectal cancer Neg Hx    Social History:  reports that he has been smoking.  He has never used smokeless tobacco. He reports that he drinks alcohol. He reports that he does not use illicit drugs.  Allergies:  Allergies  Allergen Reactions  . Penicillins Hives and Itching     (Not in a hospital admission)  Results for orders placed or performed during the hospital encounter of 03/17/15 (from the past 48 hour(s))  Basic metabolic panel     Status: Abnormal   Collection Time: 03/17/15  6:25 PM  Result Value Ref Range   Sodium 138 135 - 145 mmol/L   Potassium 4.0 3.5 - 5.1 mmol/L   Chloride 99 96 - 112 mmol/L   CO2 27 19 - 32 mmol/L   Glucose, Bld 111 (H) 70 - 99 mg/dL   BUN 13 6 - 23 mg/dL   Creatinine, Ser 1.07 0.50 - 1.35 mg/dL   Calcium 8.5 8.4 - 10.5 mg/dL   GFR calc non Af Amer 79 (L) >90 mL/min   GFR calc Af  Amer >90 >90 mL/min    Comment: (NOTE) The eGFR has been calculated using the CKD EPI equation. This calculation has not been validated in all clinical situations. eGFR's persistently <90 mL/min signify possible Chronic Kidney Disease.    Anion gap 12 5 - 15  CBC with Differential     Status: Abnormal   Collection Time: 03/17/15  6:25 PM  Result Value Ref Range   WBC 3.7 (L) 4.0 - 10.5 K/uL   RBC 4.73 4.22 - 5.81 MIL/uL   Hemoglobin 13.0 13.0 - 17.0 g/dL   HCT 37.5 (L) 39.0 - 52.0 %   MCV 79.3 78.0 - 100.0 fL   MCH 27.5 26.0 - 34.0 pg   MCHC 34.7 30.0 - 36.0 g/dL   RDW 13.3 11.5 - 15.5 %   Platelets 314 150 - 400 K/uL   Neutrophils Relative % 54 43 - 77 %   Neutro Abs 2.0 1.7 - 7.7 K/uL   Lymphocytes Relative 21 12 - 46 %   Lymphs Abs 0.8 0.7 - 4.0 K/uL   Monocytes Relative 21 (H) 3 - 12 %   Monocytes Absolute 0.8 0.1 - 1.0 K/uL    Eosinophils Relative 3 0 - 5 %   Eosinophils Absolute 0.1 0.0 - 0.7 K/uL   Basophils Relative 1 0 - 1 %   Basophils Absolute 0.1 0.0 - 0.1 K/uL  I-Stat CG4 Lactic Acid, ED     Status: None   Collection Time: 03/17/15  6:40 PM  Result Value Ref Range   Lactic Acid, Venous 1.44 0.5 - 2.0 mmol/L  I-stat Chem 8, ED     Status: Abnormal   Collection Time: 03/17/15  6:40 PM  Result Value Ref Range   Sodium 139 135 - 145 mmol/L   Potassium 4.0 3.5 - 5.1 mmol/L   Chloride 100 96 - 112 mmol/L   BUN 14 6 - 23 mg/dL   Creatinine, Ser 1.10 0.50 - 1.35 mg/dL   Glucose, Bld 111 (H) 70 - 99 mg/dL   Calcium, Ion 0.98 (L) 1.12 - 1.23 mmol/L   TCO2 25 0 - 100 mmol/L   Hemoglobin 13.9 13.0 - 17.0 g/dL   HCT 41.0 39.0 - 52.0 %   No results found.  Review of Systems  Constitutional: Negative for weight loss.  HENT: Negative for ear discharge, ear pain, hearing loss and tinnitus.   Eyes: Negative for blurred vision, double vision, photophobia and pain.  Respiratory: Negative for cough, sputum production and shortness of breath.   Cardiovascular: Negative for chest pain.  Gastrointestinal: Positive for nausea, vomiting and abdominal pain.  Genitourinary: Negative for dysuria, urgency, frequency and flank pain.  Musculoskeletal: Negative for myalgias, back pain, joint pain, falls and neck pain.  Neurological: Negative for dizziness, tingling, sensory change, focal weakness, loss of consciousness and headaches.  Endo/Heme/Allergies: Does not bruise/bleed easily.  Psychiatric/Behavioral: Negative for depression, memory loss and substance abuse. The patient is not nervous/anxious.     Blood pressure 134/82, pulse 85, temperature 98.6 F (37 C), temperature source Oral, resp. rate 20, height 6' (1.829 m), weight 104.327 kg (230 lb), SpO2 93 %. Physical Exam  Vitals reviewed. Constitutional: He is oriented to person, place, and time. He appears well-developed and well-nourished. He is cooperative. No  distress. Cervical collar and nasal cannula in place.  HENT:  Head: Normocephalic and atraumatic. Head is without raccoon's eyes, without Battle's sign, without abrasion, without contusion and without laceration.  Right Ear: Hearing, tympanic membrane, external ear  and ear canal normal. No lacerations. No drainage or tenderness. No foreign bodies. Tympanic membrane is not perforated. No hemotympanum.  Left Ear: Hearing, tympanic membrane, external ear and ear canal normal. No lacerations. No drainage or tenderness. No foreign bodies. Tympanic membrane is not perforated. No hemotympanum.  Nose: Nose normal. No nose lacerations, sinus tenderness, nasal deformity or nasal septal hematoma. No epistaxis.  Mouth/Throat: Uvula is midline, oropharynx is clear and moist and mucous membranes are normal. No lacerations.  Eyes: Conjunctivae, EOM and lids are normal. Pupils are equal, round, and reactive to light. No scleral icterus.  Neck: Trachea normal. No JVD present. No spinous process tenderness and no muscular tenderness present. Carotid bruit is not present. No thyromegaly present.  Cardiovascular: Normal rate, regular rhythm, normal heart sounds, intact distal pulses and normal pulses.   Respiratory: Effort normal and breath sounds normal. No respiratory distress. He exhibits no tenderness, no bony tenderness, no laceration and no crepitus.  GI: Soft. Normal appearance. He exhibits distension. Bowel sounds are decreased. There is tenderness (x4Q). There is no rigidity, no rebound, no guarding and no CVA tenderness.  Musculoskeletal: Normal range of motion. He exhibits no edema or tenderness.  Lymphadenopathy:    He has no cervical adenopathy.  Neurological: He is alert and oriented to person, place, and time. He has normal strength. No cranial nerve deficit or sensory deficit. GCS eye subscore is 4. GCS verbal subscore is 5. GCS motor subscore is 6.  Skin: Skin is warm, dry and intact. He is not  diaphoretic.  Psychiatric: He has a normal mood and affect. His speech is normal and behavior is normal.     Assessment/Plan 54 year old male status post laparoscopic Assisted right hemicolectomy, with likely ileus currently.  1. We'll admit patient to continue nothing by mouth 2. IV fluids 3. Discussed with the patient will also NG tube at this point if he continues with nausea vomiting will place an NG tube decompression.  Rosario Jacks., Meital Riehl 03/17/2015, 9:32 PM

## 2015-03-18 ENCOUNTER — Inpatient Hospital Stay (HOSPITAL_COMMUNITY): Payer: BLUE CROSS/BLUE SHIELD

## 2015-03-18 LAB — BASIC METABOLIC PANEL
Anion gap: 11 (ref 5–15)
BUN: 12 mg/dL (ref 6–23)
CALCIUM: 8.1 mg/dL — AB (ref 8.4–10.5)
CO2: 27 mmol/L (ref 19–32)
Chloride: 102 mmol/L (ref 96–112)
Creatinine, Ser: 0.93 mg/dL (ref 0.50–1.35)
Glucose, Bld: 134 mg/dL — ABNORMAL HIGH (ref 70–99)
Potassium: 3.5 mmol/L (ref 3.5–5.1)
Sodium: 140 mmol/L (ref 135–145)

## 2015-03-18 MED ORDER — PHENOL 1.4 % MT LIQD
1.0000 | OROMUCOSAL | Status: DC | PRN
  Administered 2015-03-23: 1 via OROMUCOSAL
  Filled 2015-03-18: qty 177

## 2015-03-18 MED ORDER — BISACODYL 10 MG RE SUPP
10.0000 mg | Freq: Once | RECTAL | Status: AC
  Administered 2015-03-18: 10 mg via RECTAL
  Filled 2015-03-18: qty 1

## 2015-03-18 MED ORDER — KETOROLAC TROMETHAMINE 15 MG/ML IJ SOLN
15.0000 mg | Freq: Four times a day (QID) | INTRAMUSCULAR | Status: AC
  Administered 2015-03-18 – 2015-03-19 (×4): 15 mg via INTRAVENOUS
  Filled 2015-03-18 (×3): qty 1

## 2015-03-18 MED ORDER — DIPHENHYDRAMINE HCL 50 MG/ML IJ SOLN
12.5000 mg | Freq: Four times a day (QID) | INTRAMUSCULAR | Status: DC | PRN
  Administered 2015-03-18 – 2015-03-22 (×8): 25 mg via INTRAVENOUS
  Administered 2015-03-23 – 2015-03-25 (×3): 12.5 mg via INTRAVENOUS
  Administered 2015-03-26 – 2015-03-28 (×3): 25 mg via INTRAVENOUS
  Filled 2015-03-18 (×14): qty 1

## 2015-03-18 MED ORDER — MENTHOL 3 MG MT LOZG: 1.0000 | LOZENGE | OROMUCOSAL | Status: DC | PRN

## 2015-03-18 NOTE — Progress Notes (Signed)
Patient ID: [name], male   DOB: Jun 22, 1961, 54 y.o.   MRN: [id number]     [address] [place], [place] [address]    Phone: [phone] FAX: [phone]     Subjective: 1243m dark bilious output.  Feels terrible.  C/o pain.  Dilaudid not holding him every 2 hours.  Not mobilizing.  Voiding.    Objective:  Vital signs:  Filed Vitals:   03/17/15 2215 03/17/15 2310 03/18/15 0603 03/18/15 0855  BP: 146/86 141/84 130/79 134/90  Pulse: 92 81 88 88  Temp:  97.6 F (36.4 C) 98.2 F (36.8 C) 97.5 F (36.4 C)  TempSrc:  Oral Oral Oral  Resp: 14 16 15 16   Height:  6' (1.829 m)    Weight:  104.327 kg (230 lb)    SpO2: 93% 98% 98% 94%    Last BM Date: 03/17/15  Intake/Output   Yesterday:  04/19 0701 - 04/20 0700 In: 577.1 [I.V.:577.1] Out: 1800 [Urine:600; Emesis/NG output:1200] This shift:  Total I/O In: 0  Out: 400 [Urine:400]   Physical Exam: General: Pt awake/alert/oriented x4 in no acute distress Chest: cta. No chest wall pain w good excursion CV:  Pulses intact.  Regular rhythm Abdomen: hypoactive bowel sounds.  Abdomen is distended.  Midline incision and lap sites with staples in place, no erythema.  No evidence of peritonitis.  No incarcerated hernias. Ext:  SCDs BLE.  No mjr edema.  No cyanosis Skin: No petechiae / purpura   Problem List:   Active Problems:   Ileus    Results:   Labs: Results for orders placed or performed during the hospital encounter of 03/17/15 (from the past 48 hour(s))  Basic metabolic panel     Status: Abnormal   Collection Time: 03/17/15  6:25 PM  Result Value Ref Range   Sodium 138 135 - 145 mmol/L   Potassium 4.0 3.5 - 5.1 mmol/L   Chloride 99 96 - 112 mmol/L   CO2 27 19 - 32 mmol/L   Glucose, Bld 111 (H) 70 - 99 mg/dL   BUN 13 6 - 23 mg/dL   Creatinine, Ser 1.07 0.50 - 1.35 mg/dL   Calcium 8.5 8.4 - 10.5 mg/dL   GFR calc non Af Amer 79 (L) >90  mL/min   GFR calc Af Amer >90 >90 mL/min    Comment: (NOTE) The eGFR has been calculated using the CKD EPI equation. This calculation has not been validated in all clinical situations. eGFR's persistently <90 mL/min signify possible Chronic Kidney Disease.    Anion gap 12 5 - 15  CBC with Differential     Status: Abnormal   Collection Time: 03/17/15  6:25 PM  Result Value Ref Range   WBC 3.7 (L) 4.0 - 10.5 K/uL   RBC 4.73 4.22 - 5.81 MIL/uL   Hemoglobin 13.0 13.0 - 17.0 g/dL   HCT 37.5 (L) 39.0 - 52.0 %   MCV 79.3 78.0 - 100.0 fL   MCH 27.5 26.0 - 34.0 pg   MCHC 34.7 30.0 - 36.0 g/dL   RDW 13.3 11.5 - 15.5 %   Platelets 314 150 - 400 K/uL   Neutrophils Relative % 54 43 - 77 %   Neutro Abs 2.0 1.7 - 7.7 K/uL   Lymphocytes Relative 21 12 - 46 %   Lymphs Abs 0.8 0.7 - 4.0 K/uL   Monocytes Relative 21 (H) 3 - 12 %  Monocytes Absolute 0.8 0.1 - 1.0 K/uL   Eosinophils Relative 3 0 - 5 %   Eosinophils Absolute 0.1 0.0 - 0.7 K/uL   Basophils Relative 1 0 - 1 %   Basophils Absolute 0.1 0.0 - 0.1 K/uL  I-Stat CG4 Lactic Acid, ED     Status: None   Collection Time: 03/17/15  6:40 PM  Result Value Ref Range   Lactic Acid, Venous 1.44 0.5 - 2.0 mmol/L  I-stat Chem 8, ED     Status: Abnormal   Collection Time: 03/17/15  6:40 PM  Result Value Ref Range   Sodium 139 135 - 145 mmol/L   Potassium 4.0 3.5 - 5.1 mmol/L   Chloride 100 96 - 112 mmol/L   BUN 14 6 - 23 mg/dL   Creatinine, Ser 1.10 0.50 - 1.35 mg/dL   Glucose, Bld 111 (H) 70 - 99 mg/dL   Calcium, Ion 0.98 (L) 1.12 - 1.23 mmol/L   TCO2 25 0 - 100 mmol/L   Hemoglobin 13.9 13.0 - 17.0 g/dL   HCT 41.0 39.0 - 95.6 %  Basic metabolic panel     Status: Abnormal   Collection Time: 03/18/15  4:42 AM  Result Value Ref Range   Sodium 140 135 - 145 mmol/L   Potassium 3.5 3.5 - 5.1 mmol/L   Chloride 102 96 - 112 mmol/L   CO2 27 19 - 32 mmol/L   Glucose, Bld 134 (H) 70 - 99 mg/dL   BUN 12 6 - 23 mg/dL   Creatinine, Ser 0.93 0.50 -  1.35 mg/dL   Calcium 8.1 (L) 8.4 - 10.5 mg/dL   GFR calc non Af Amer >90 >90 mL/min   GFR calc Af Amer >90 >90 mL/min    Comment: (NOTE) The eGFR has been calculated using the CKD EPI equation. This calculation has not been validated in all clinical situations. eGFR's persistently <90 mL/min signify possible Chronic Kidney Disease.    Anion gap 11 5 - 15    Imaging / Studies: Dg Abd 1 View  03/18/2015   CLINICAL DATA:  Nasogastric tube.  Ileus.  EXAM: ABDOMEN - 1 VIEW  COMPARISON:  Abdominal CT 03/17/2015  FINDINGS: The orogastric tube tip is in the proximal stomach.  Persistent small bowel distention.  Stable basilar aeration.  IMPRESSION: 1. Orogastric tube with tip in good position. 2. Persistent gaseous distension of small bowel.   Electronically Signed   By: Monte Fantasia M.D.   On: 03/18/2015 02:10   Ct Abdomen Pelvis W Contrast  03/17/2015   CLINICAL DATA:  Recent colon resection for colon cancer with abdominal pain, distention, nausea, and vomiting.  EXAM: CT ABDOMEN AND PELVIS WITH CONTRAST  TECHNIQUE: Multidetector CT imaging of the abdomen and pelvis was performed using the standard protocol following bolus administration of intravenous contrast.  CONTRAST:  142m OMNIPAQUE IOHEXOL 300 MG/ML  SOLN  COMPARISON:  01/27/2015  FINDINGS: Subsegmental atelectasis is present in the lung bases. There is no pleural effusion.  The liver, gallbladder, spleen, adrenal glands, kidneys, and pancreas have an unremarkable enhanced appearance.  Sequelae of interval right colectomy are identified. There are multiple fluid-filled loops of moderately dilated small bowel throughout the abdomen measuring up to approximately 5 cm in diameter. There is a transition to a small amount of decompressed distal small bowel in the right mid abdomen in the region of the anastomosis. The colon is largely decompressed, with scattered diverticulosis noted.  There is a small amount of gas in the  bladder, query recent  catheterization. A small amount of free fluid is present in the right lower quadrant and pelvis. No intraperitoneal free air is identified. Minimal atherosclerotic vascular calcification is noted. No enlarged lymph nodes are identified. No acute osseous abnormality or destructive osseous lesions are identified.  IMPRESSION: Interval right colectomy. Moderate dilatation of multiple small bowel loops consistent with obstruction, with transition to decompressed distal small bowel in the right mid abdomen near the anastomosis.   Electronically Signed   By: Logan Bores   On: 03/17/2015 21:36    Medications / Allergies:  Scheduled Meds: . enoxaparin (LOVENOX) injection  40 mg Subcutaneous Q24H  . ketorolac  15 mg Intravenous 4 times per day   Continuous Infusions: . dextrose 5 % and 0.9% NaCl 125 mL/hr at 03/17/15 2320   PRN Meds:.diphenhydrAMINE, HYDROmorphone (DILAUDID) injection, ondansetron  Antibiotics: Anti-infectives    None        Assessment/Plan S/p right hemicolectomy---Dr. Donne Hazel 03/14/15 Readmitted with a ileus  -continue NGT decompression, bowel rest -repeat films in AM(last at 0200) -IVF -pain control--IV dilaudid, toradol 75m x24h -needs to mobilize -IS -SCD/lovenox   EErby [facility] Pager [phone](7A-4:30P) For consults and floor pages call [phone](7A-4:30P)  03/18/2015 10:38 AM

## 2015-03-18 NOTE — Care Management Note (Signed)
  Page 1 of 1   03/18/2015     8:10:54 AM CARE MANAGEMENT NOTE 03/18/2015  Patient:  [name], [name]   Account Number:  [id number]  Date Initiated:  03/18/2015  Documentation initiated by:  Magdalen Spatz  Subjective/Objective Assessment:   postoperative day #7 from a laparoscopic right hemicolectomy    ileus     Action/Plan:   NPO IVF   Anticipated DC Date:  03/20/2015   Anticipated DC Plan:  HOME/SELF CARE         Choice offered to / List presented to:             Status of service:  In process, will continue to follow Medicare Important Message given?   (If response is "NO", the following Medicare IM given date fields will be blank) Date Medicare IM given:   Medicare IM given by:   Date Additional Medicare IM given:   Additional Medicare IM given by:    Discharge Disposition:    Per UR Regulation:  Reviewed for med. necessity/level of care/duration of stay  If discussed at [place] of Stay Meetings, dates discussed:    Comments:

## 2015-03-18 NOTE — Progress Notes (Signed)
INITIAL NUTRITION ASSESSMENT  DOCUMENTATION CODES Per approved criteria  -Obesity Unspecified   INTERVENTION: -RD to follow for diet advancement -Supplement diet as appropriate  NUTRITION DIAGNOSIS: Inadequate oral intake related to inability to eat as evidenced by NPO.   Goal: Pt will meet >90% of estimated nutritional needs  Monitor:  Diet advancement, PO/supplement intake, labs, weight changes, I/O's  Reason for Assessment: MST=3  54 y.o. male  Admitting Dx: <principal problem not specified>  54 year old male who currently is postoperative day #7 from a laparoscopic right hemicolectomy. Patient comes today he states he's had decreased by mouth over the last 4 days. He states he had some nausea and vomiting today. He's had some abdominal distention. He did have a bowel movement earlier today however was minimal. CT scan revealed distended loops of small bowel. Discussed with the radiologist is concern over potential mechanical obstruction at the anastomosis.  ASSESSMENT: Pt admitted with abdominal pain. Noted pt is POD #7 for rt hemicolectomy. Surgery suspects possible ileus.  Pt unavailable at time of visit. Nutrition-focused physical exam deferred at this time. Per chart review, pt with decreased oral intake x 4 days. Wt hx reveals UBW between 230-242#. Wt fluctuates at baseline.  Pt with currently with NGT for decompression. Noted 1600 ml output within the past 24 hours per doc flowsheets.  Labs reviewed. Calcium: 8.1, Glucose: 134.   Height: Ht Readings from Last 1 Encounters:  03/17/15 6' (1.829 m)    Weight: Wt Readings from Last 1 Encounters:  03/17/15 230 lb (104.327 kg)    Ideal Body Weight: 178#  % Ideal Body Weight: 129%  Wt Readings from Last 10 Encounters:  03/17/15 230 lb (104.327 kg)  03/10/15 236 lb 5.3 oz (107.2 kg)  01/26/15 241 lb (109.317 kg)  01/12/15 241 lb 9.6 oz (109.589 kg)  12/08/14 239 lb (108.41 kg)  11/10/14 242 lb (109.77 kg)   10/29/14 240 lb (108.863 kg)  01/06/14 234 lb (106.142 kg)  09/18/13 236 lb (107.049 kg)  06/07/13 229 lb 2 oz (103.93 kg)    Usual Body Weight:235#  % Usual Body Weight: 98%  BMI:  Body mass index is 31.19 kg/(m^2). Obesity, class I  Estimated Nutritional Needs: Kcal: 2000-2200 Protein: 95-105 grams Fluid: 2.0-2.2 L  Skin: closed abdominal incision  Diet Order: Diet NPO time specified Except for: Other (See Comments)  EDUCATION NEEDS: -Education not appropriate at this time   Intake/Output Summary (Last 24 hours) at 03/18/15 1610 Last data filed at 03/18/15 1317  Gross per 24 hour  Intake 577.08 ml  Output   2900 ml  Net -2322.92 ml    Last BM: 03/17/15  Labs:   Recent Labs Lab 03/12/15 0410 03/17/15 1825 03/17/15 1840 03/18/15 0442  NA 139 138 139 140  K 3.9 4.0 4.0 3.5  CL 101 99 100 102  CO2 31 27  --  27  BUN 9 13 14 12   CREATININE 1.04 1.07 1.10 0.93  CALCIUM 8.3* 8.5  --  8.1*  GLUCOSE 115* 111* 111* 134*    CBG (last 3)  No results for input(s): GLUCAP in the last 72 hours.  Lab Results  Component Value Date   HGBA1C 5.7 05/21/2012    Scheduled Meds: . enoxaparin (LOVENOX) injection  40 mg Subcutaneous Q24H  . ketorolac  15 mg Intravenous 4 times per day    Continuous Infusions: . dextrose 5 % and 0.9% NaCl 125 mL/hr at 03/18/15 1408    Past Medical History  Diagnosis  Date  . Coronary atherosclerosis of unspecified type of vessel, native or graft   . Pure hypercholesterolemia   . Tobacco use disorder   . Obstructive sleep apnea (adult) (pediatric)   . Problems related to high-risk sexual behavior   . Cellulitis and abscess of unspecified site   . Dysmetabolic syndrome X   . Erectile dysfunction   . Chronic rhinitis   . Hypertrophy of prostate with urinary obstruction and other lower urinary tract symptoms (LUTS)   . Family history of malignant neoplasm of gastrointestinal tract   . Complication of anesthesia     TOOK AWHILE  TO GO TO SLEEP   . Cancer   . Diabetes mellitus without complication   . Sickle cell anemia     Past Surgical History  Procedure Laterality Date  . Ptca    . Incision and drainage abscess / hematoma of bursa / knee / thigh      I&D of complex abscess,left axilla. I&D of large infected pilonidal abscess  . Cosmetic surgery    . Hernia repair      UHR  AT [facility]  <5 YRS AGO   . Laparoscopic partial right colectomy Right 03/10/2015  . Colon surgery    . Laparoscopic partial colectomy N/A 03/10/2015    Procedure: LAPAROSCOPIC ASSISTED RIGHT COLECTOMY;  Surgeon: Rolm Bookbinder, MD;  Location: [facility];  Service: General;  Laterality: N/A;    Denean Pavon A. Jimmye Norman, RD, LDN, CDE Pager: [phone] After hours Pager: [phone]

## 2015-03-19 ENCOUNTER — Inpatient Hospital Stay (HOSPITAL_COMMUNITY): Payer: BLUE CROSS/BLUE SHIELD

## 2015-03-19 LAB — CBC
HCT: 33.8 % — ABNORMAL LOW (ref 39.0–52.0)
HEMOGLOBIN: 11.4 g/dL — AB (ref 13.0–17.0)
MCH: 27.1 pg (ref 26.0–34.0)
MCHC: 33.7 g/dL (ref 30.0–36.0)
MCV: 80.3 fL (ref 78.0–100.0)
Platelets: 334 10*3/uL (ref 150–400)
RBC: 4.21 MIL/uL — ABNORMAL LOW (ref 4.22–5.81)
RDW: 13.7 % (ref 11.5–15.5)
WBC: 3.8 10*3/uL — ABNORMAL LOW (ref 4.0–10.5)

## 2015-03-19 LAB — BASIC METABOLIC PANEL
ANION GAP: 11 (ref 5–15)
BUN: 17 mg/dL (ref 6–23)
CALCIUM: 8 mg/dL — AB (ref 8.4–10.5)
CO2: 29 mmol/L (ref 19–32)
CREATININE: 1.09 mg/dL (ref 0.50–1.35)
Chloride: 107 mmol/L (ref 96–112)
GFR calc non Af Amer: 77 mL/min — ABNORMAL LOW (ref >90)
GFR, EST AFRICAN AMERICAN: 89 mL/min — AB (ref >90)
Glucose, Bld: 127 mg/dL — ABNORMAL HIGH (ref 70–99)
Potassium: 3.4 mmol/L — ABNORMAL LOW (ref 3.5–5.1)
Sodium: 147 mmol/L — ABNORMAL HIGH (ref 135–145)

## 2015-03-19 MED ORDER — KETOROLAC TROMETHAMINE 15 MG/ML IJ SOLN
15.0000 mg | Freq: Four times a day (QID) | INTRAMUSCULAR | Status: AC
  Administered 2015-03-19 – 2015-03-20 (×4): 15 mg via INTRAVENOUS
  Filled 2015-03-19 (×3): qty 1

## 2015-03-19 MED ORDER — BISACODYL 10 MG RE SUPP
10.0000 mg | Freq: Once | RECTAL | Status: AC
  Administered 2015-03-19: 10 mg via RECTAL
  Filled 2015-03-19: qty 1

## 2015-03-19 MED ORDER — PANTOPRAZOLE SODIUM 40 MG IV SOLR
40.0000 mg | Freq: Two times a day (BID) | INTRAVENOUS | Status: DC
  Administered 2015-03-19 – 2015-03-28 (×20): 40 mg via INTRAVENOUS
  Filled 2015-03-19 (×17): qty 40

## 2015-03-19 NOTE — Progress Notes (Signed)
Subjective: Feels better, doesn't want to be here, having fair amount of flatus, stomach better, had small bm   Objective: Vital signs in last 24 hours: Temp:  [97.5 F (36.4 C)-99.1 F (37.3 C)] 97.8 F (36.6 C) (04/21 0507) Pulse Rate:  [77-92] 77 (04/21 0507) Resp:  [16] 16 (04/21 0507) BP: (127-140)/(79-90) 132/80 mmHg (04/21 0507) SpO2:  [94 %-98 %] 98 % (04/21 0507) Last BM Date: 03/17/15 (small per pt)  Intake/Output from previous day: 04/20 0701 - 04/21 0700 In: 2941.7 [P.O.:485; I.V.:2456.7] Out: 4350 [Urine:1600; Emesis/NG output:2750] Intake/Output this shift:    General appearance: no distress Resp: clear to auscultation bilaterally Cardio: regular rate and rhythm GI: incision clean, soft moderate distention  Lab Results:   Recent Labs  03/17/15 1825 03/17/15 1840 03/19/15 0726  WBC 3.7*  --  3.8*  HGB 13.0 13.9 11.4*  HCT 37.5* 41.0 33.8*  PLT 314  --  334   BMET  Recent Labs  03/18/15 0442 03/19/15 0726  NA 140 147*  K 3.5 3.4*  CL 102 107  CO2 27 29  GLUCOSE 134* 127*  BUN 12 17  CREATININE 0.93 1.09  CALCIUM 8.1* 8.0*   PT/INR No results for input(s): LABPROT, INR in the last 72 hours. ABG No results for input(s): PHART, HCO3 in the last 72 hours.  Invalid input(s): PCO2, PO2  Studies/Results: Dg Abd 1 View  03/19/2015   CLINICAL DATA:  Ileus.  EXAM: ABDOMEN - 1 VIEW  COMPARISON:  03/18/2015.  FINDINGS: Persistent severely distended small bowel loops noted. No interim change. Findings suggest small bowel obstruction versus severe ileus. No pneumatosis. No free air identified. Upper portion of the abdomen not imaged. NG tube not visualized. Surgical staples noted over the abdomen. No acute bony abnormality.  IMPRESSION: Persistent severely distended loops of small bowel again noted. No interim change. Findings consistent with persistent small bowel obstruction or severe ileus.   Electronically Signed   By: Marcello Moores  Register   On:  03/19/2015 07:06   Dg Abd 1 View  03/18/2015   CLINICAL DATA:  Nasogastric tube.  Ileus.  EXAM: ABDOMEN - 1 VIEW  COMPARISON:  Abdominal CT 03/17/2015  FINDINGS: The orogastric tube tip is in the proximal stomach.  Persistent small bowel distention.  Stable basilar aeration.  IMPRESSION: 1. Orogastric tube with tip in good position. 2. Persistent gaseous distension of small bowel.   Electronically Signed   By: Monte Fantasia M.D.   On: 03/18/2015 02:10   Ct Abdomen Pelvis W Contrast  03/17/2015   CLINICAL DATA:  Recent colon resection for colon cancer with abdominal pain, distention, nausea, and vomiting.  EXAM: CT ABDOMEN AND PELVIS WITH CONTRAST  TECHNIQUE: Multidetector CT imaging of the abdomen and pelvis was performed using the standard protocol following bolus administration of intravenous contrast.  CONTRAST:  115mL OMNIPAQUE IOHEXOL 300 MG/ML  SOLN  COMPARISON:  01/27/2015  FINDINGS: Subsegmental atelectasis is present in the lung bases. There is no pleural effusion.  The liver, gallbladder, spleen, adrenal glands, kidneys, and pancreas have an unremarkable enhanced appearance.  Sequelae of interval right colectomy are identified. There are multiple fluid-filled loops of moderately dilated small bowel throughout the abdomen measuring up to approximately 5 cm in diameter. There is a transition to a small amount of decompressed distal small bowel in the right mid abdomen in the region of the anastomosis. The colon is largely decompressed, with scattered diverticulosis noted.  There is a small amount of gas in the  bladder, query recent catheterization. A small amount of free fluid is present in the right lower quadrant and pelvis. No intraperitoneal free air is identified. Minimal atherosclerotic vascular calcification is noted. No enlarged lymph nodes are identified. No acute osseous abnormality or destructive osseous lesions are identified.  IMPRESSION: Interval right colectomy. Moderate dilatation of  multiple small bowel loops consistent with obstruction, with transition to decompressed distal small bowel in the right mid abdomen near the anastomosis.   Electronically Signed   By: Logan Bores   On: 03/17/2015 21:36    Anti-infectives: Anti-infectives    None      Assessment/Plan: POD 9 lap right colectomy for benign disease  1. Cont pain meds as needed, less requirement per pt 2. I think ileus is likely dx although ct showed possible issue at anastomosis, he had been having nl bowel function and was soft prior to dc first time though. His film is still significant for ileus and ng is putting out but clinically he is better today with softer abdomen and he is passing flatus and had a small stool. I am hopeful he will get better 3. Lovenox, scds   [facility] 03/19/2015

## 2015-03-20 MED ORDER — ACETAMINOPHEN 10 MG/ML IV SOLN
1000.0000 mg | Freq: Four times a day (QID) | INTRAVENOUS | Status: AC
  Administered 2015-03-20 – 2015-03-21 (×4): 1000 mg via INTRAVENOUS
  Filled 2015-03-20 (×4): qty 100

## 2015-03-20 MED ORDER — KETOROLAC TROMETHAMINE 15 MG/ML IJ SOLN
15.0000 mg | Freq: Four times a day (QID) | INTRAMUSCULAR | Status: DC | PRN
  Administered 2015-03-20: 15 mg via INTRAVENOUS
  Filled 2015-03-20: qty 1

## 2015-03-20 MED ORDER — HYDROMORPHONE HCL 1 MG/ML IJ SOLN
1.0000 mg | INTRAMUSCULAR | Status: DC | PRN
  Administered 2015-03-20 – 2015-03-28 (×59): 1 mg via INTRAVENOUS
  Filled 2015-03-20 (×59): qty 1

## 2015-03-20 NOTE — Progress Notes (Signed)
Patient ID: DAYTEN JUBA, male   DOB: 1961/05/06, 54 y.o.   MRN: [id number]    Subjective: Pt with maybe a small amount of flatus.  2000cc from NG yesterday, but taking in some clears  Objective: Vital signs in last 24 hours: Temp:  [98.5 F (36.9 C)-98.8 F (37.1 C)] 98.7 F (37.1 C) (04/22 0538) Pulse Rate:  [74-80] 80 (04/22 0538) Resp:  [16-18] 16 (04/22 0538) BP: (109-132)/(57-80) 132/80 mmHg (04/22 0538) SpO2:  [95 %-98 %] 98 % (04/22 0538) Last BM Date: 03/16/15  Intake/Output from previous day: 04/21 0701 - 04/22 0700 In: 3784.7 [P.O.:240; I.V.:3544.7] Out: 2200 [Urine:200; Emesis/NG output:2000] Intake/Output this shift: Total I/O In: -  Out: 200 [Urine:200]  PE: Abd: still distended, hypoactive BS, NGT with bilious output this am, tender appropriately, incisions are c/d/i  Lab Results:   Recent Labs  03/17/15 1825 03/17/15 1840 03/19/15 0726  WBC 3.7*  --  3.8*  HGB 13.0 13.9 11.4*  HCT 37.5* 41.0 33.8*  PLT 314  --  334   BMET  Recent Labs  03/18/15 0442 03/19/15 0726  NA 140 147*  K 3.5 3.4*  CL 102 107  CO2 27 29  GLUCOSE 134* 127*  BUN 12 17  CREATININE 0.93 1.09  CALCIUM 8.1* 8.0*   PT/INR No results for input(s): LABPROT, INR in the last 72 hours. CMP     Component Value Date/Time   NA 147* 03/19/2015 0726   K 3.4* 03/19/2015 0726   CL 107 03/19/2015 0726   CO2 29 03/19/2015 0726   GLUCOSE 127* 03/19/2015 0726   BUN 17 03/19/2015 0726   CREATININE 1.09 03/19/2015 0726   CALCIUM 8.0* 03/19/2015 0726   PROT 7.0 11/10/2014 1609   ALBUMIN 3.9 11/10/2014 1609   AST 21 11/10/2014 1609   ALT 21 11/10/2014 1609   ALKPHOS 93 11/10/2014 1609   BILITOT 0.6 11/10/2014 1609   GFRNONAA 77* 03/19/2015 0726   GFRAA 89* 03/19/2015 0726   Lipase  No results found for: LIPASE     Studies/Results: Dg Abd 1 View  03/19/2015   CLINICAL DATA:  Ileus.  EXAM: ABDOMEN - 1 VIEW  COMPARISON:  03/18/2015.  FINDINGS: Persistent severely distended  small bowel loops noted. No interim change. Findings suggest small bowel obstruction versus severe ileus. No pneumatosis. No free air identified. Upper portion of the abdomen not imaged. NG tube not visualized. Surgical staples noted over the abdomen. No acute bony abnormality.  IMPRESSION: Persistent severely distended loops of small bowel again noted. No interim change. Findings consistent with persistent small bowel obstruction or severe ileus.   Electronically Signed   By: Marcello Moores  Register   On: 03/19/2015 07:06    Anti-infectives: Anti-infectives    None       Assessment/Plan   POD 10 lap right colectomy for benign disease -cont toradol to help with pain control.  Cr 1.09 yesterday.  Will recheck in AM -still distended with hypoactive bowel function.  NGT needs to remain in place -mobilize -suspect staples could come out today unless Dr. Donne Hazel wants to keep them in until more tension is off of then from distention. DVT prophylaxis -lovenox/SCDs  LOS: 3 days    Samaira Holzworth E 03/20/2015, 9:42 AM Pager: [phone]

## 2015-03-21 ENCOUNTER — Inpatient Hospital Stay (HOSPITAL_COMMUNITY): Payer: BLUE CROSS/BLUE SHIELD

## 2015-03-21 LAB — BASIC METABOLIC PANEL
ANION GAP: 13 (ref 5–15)
BUN: 18 mg/dL (ref 6–23)
CALCIUM: 8.2 mg/dL — AB (ref 8.4–10.5)
CO2: 26 mmol/L (ref 19–32)
CREATININE: 1.1 mg/dL (ref 0.50–1.35)
Chloride: 115 mmol/L — ABNORMAL HIGH (ref 96–112)
GFR calc Af Amer: 88 mL/min — ABNORMAL LOW (ref >90)
GFR calc non Af Amer: 76 mL/min — ABNORMAL LOW (ref >90)
GLUCOSE: 137 mg/dL — AB (ref 70–99)
Potassium: 3.5 mmol/L (ref 3.5–5.1)
SODIUM: 154 mmol/L — AB (ref 135–145)

## 2015-03-21 LAB — CBC
HEMATOCRIT: 35 % — AB (ref 39.0–52.0)
HEMOGLOBIN: 11.6 g/dL — AB (ref 13.0–17.0)
MCH: 27 pg (ref 26.0–34.0)
MCHC: 33.1 g/dL (ref 30.0–36.0)
MCV: 81.4 fL (ref 78.0–100.0)
Platelets: 373 10*3/uL (ref 150–400)
RBC: 4.3 MIL/uL (ref 4.22–5.81)
RDW: 14.1 % (ref 11.5–15.5)
WBC: 5.5 10*3/uL (ref 4.0–10.5)

## 2015-03-21 MED ORDER — POTASSIUM CHLORIDE 2 MEQ/ML IV SOLN
INTRAVENOUS | Status: DC
  Administered 2015-03-21 – 2015-03-23 (×3): via INTRAVENOUS
  Filled 2015-03-21 (×13): qty 1000

## 2015-03-21 MED ORDER — POTASSIUM CHLORIDE 10 MEQ/100ML IV SOLN
10.0000 meq | INTRAVENOUS | Status: AC
  Administered 2015-03-21 (×5): 10 meq via INTRAVENOUS
  Filled 2015-03-21: qty 100

## 2015-03-21 NOTE — Plan of Care (Signed)
Problem: Phase I Progression Outcomes Goal: Pain controlled with appropriate interventions Outcome: Not Progressing Pt abdomen very taut and distended. C/o pain very often.

## 2015-03-21 NOTE — Progress Notes (Signed)
Abdominal x-ray showed that the NG tube is well positioned in the stomach, but the small bowel is still significantly distended with some air-fluid levels and a paucity of gas in the colon.      This is not much different than 2 days ago, but there is a rising concern that he may have a postop SBO.      We will reassess tomorrow morning and if he is clinically and radiographically obstructed we may have to reoperate on him.   [name]. Dalbert Batman, M.D., [facility] General and Minimally invasive Surgery Breast and Colorectal Surgery

## 2015-03-21 NOTE — Progress Notes (Signed)
  Subjective: Good spirits. No distress. Says he had 4 bowel movements. Nursing staff confirms 2 bowel movements and flatus  Says he wants NG tube out and wants to eat. Denies nausea. NG output down to 1000 mL per 24 hours. Abdominal x-rays pending this morning Potassium 3.5. Sodium 154. Chloride 1:15. BUN 18. Creatinine 1.1. WBC 5500.  Objective: Vital signs in last 24 hours: Temp:  [98.2 F (36.8 C)-98.5 F (36.9 C)] 98.2 F (36.8 C) (04/23 0600) Pulse Rate:  [69-79] 79 (04/23 0600) Resp:  [16] 16 (04/23 0600) BP: (122-142)/(81-90) 141/90 mmHg (04/23 0600) SpO2:  [95 %-98 %] 97 % (04/23 0600) Last BM Date: 03/21/15  Intake/Output from previous day: 04/22 0701 - 04/23 0700 In: 1189.6 [I.V.:1089.6; IV Piggyback:100] Out: 1850 [Urine:800; Emesis/NG output:1050] Intake/Output this shift:    General appearance: Alert and friendly. Cooperative. Does not look ill. GI: Distended and tympanitic. Reasonably soft. Hypoactive bowel sounds. Wounds okay  Lab Results:  Results for orders placed or performed during the hospital encounter of 03/17/15 (from the past 24 hour(s))  Basic metabolic panel     Status: Abnormal   Collection Time: 03/21/15  4:43 AM  Result Value Ref Range   Sodium 154 (H) 135 - 145 mmol/L   Potassium 3.5 3.5 - 5.1 mmol/L   Chloride 115 (H) 96 - 112 mmol/L   CO2 26 19 - 32 mmol/L   Glucose, Bld 137 (H) 70 - 99 mg/dL   BUN 18 6 - 23 mg/dL   Creatinine, Ser 1.10 0.50 - 1.35 mg/dL   Calcium 8.2 (L) 8.4 - 10.5 mg/dL   GFR calc non Af Amer 76 (L) >90 mL/min   GFR calc Af Amer 88 (L) >90 mL/min   Anion gap 13 5 - 15  CBC     Status: Abnormal   Collection Time: 03/21/15  4:43 AM  Result Value Ref Range   WBC 5.5 4.0 - 10.5 K/uL   RBC 4.30 4.22 - 5.81 MIL/uL   Hemoglobin 11.6 (L) 13.0 - 17.0 g/dL   HCT 35.0 (L) 39.0 - 52.0 %   MCV 81.4 78.0 - 100.0 fL   MCH 27.0 26.0 - 34.0 pg   MCHC 33.1 30.0 - 36.0 g/dL   RDW 14.1 11.5 - 15.5 %   Platelets 373 150 - 400  K/uL     Studies/Results: No results found.  . enoxaparin (LOVENOX) injection  40 mg Subcutaneous Q24H  . pantoprazole (PROTONIX) IV  40 mg Intravenous Q12H  . potassium chloride  10 mEq Intravenous Q1 Hr x 6     Assessment/Plan:  POD 11. Laparoscopic right colectomy for benign disease.  Reports of stool and flatus and general sense of well-being somewhat encouraging. His abdominal exam is significantly distended although without signs of any inflammatory process.  I do not think we should remove his NG tube at this time.  Check belly films  I left staples in for another day. Still thinking this is significant ileus and not obstruction, but not sure.  Relative hypokalemia, hypernatremia, hyperchloremia. Suspect a little bit dehydrated. We'll increase IV rate and decrease sodium and chloride for 24 hours.(D5 0.2%)   Check be met tomorrow  6 runs of potassium   On Lovenox for DVT prophylaxis.   _0 @  LOS: 4 days    , M 03/21/2015  . .prob

## 2015-03-22 ENCOUNTER — Inpatient Hospital Stay (HOSPITAL_COMMUNITY): Payer: BLUE CROSS/BLUE SHIELD

## 2015-03-22 LAB — BASIC METABOLIC PANEL
Anion gap: 10 (ref 5–15)
BUN: 17 mg/dL (ref 6–23)
CALCIUM: 8.3 mg/dL — AB (ref 8.4–10.5)
CHLORIDE: 114 mmol/L — AB (ref 96–112)
CO2: 30 mmol/L (ref 19–32)
Creatinine, Ser: 1.22 mg/dL (ref 0.50–1.35)
GFR calc Af Amer: 78 mL/min — ABNORMAL LOW (ref >90)
GFR, EST NON AFRICAN AMERICAN: 67 mL/min — AB (ref >90)
GLUCOSE: 132 mg/dL — AB (ref 70–99)
Potassium: 3.7 mmol/L (ref 3.5–5.1)
Sodium: 154 mmol/L — ABNORMAL HIGH (ref 135–145)

## 2015-03-22 LAB — CBC
HEMATOCRIT: 36.6 % — AB (ref 39.0–52.0)
HEMOGLOBIN: 12 g/dL — AB (ref 13.0–17.0)
MCH: 27 pg (ref 26.0–34.0)
MCHC: 32.8 g/dL (ref 30.0–36.0)
MCV: 82.2 fL (ref 78.0–100.0)
PLATELETS: 420 10*3/uL — AB (ref 150–400)
RBC: 4.45 MIL/uL (ref 4.22–5.81)
RDW: 14.4 % (ref 11.5–15.5)
WBC: 8.1 10*3/uL (ref 4.0–10.5)

## 2015-03-22 MED ORDER — IOHEXOL 300 MG/ML  SOLN
100.0000 mL | Freq: Once | INTRAMUSCULAR | Status: AC | PRN
  Administered 2015-03-22: 100 mL via INTRAVENOUS

## 2015-03-22 MED ORDER — IOHEXOL 300 MG/ML  SOLN
25.0000 mL | INTRAMUSCULAR | Status: AC
  Administered 2015-03-22 (×2): 25 mL via ORAL

## 2015-03-22 NOTE — Progress Notes (Signed)
Subjective: Says he has had for moderate size liquid stools and flatus. This is pretty much documented by the nursing staff. Denies nausea. Wants to eat. Denies abdominal pain or cramps. States that his distention is less. NG output 1300 mL, bilious, states he's drinking sodas and ice. Afebrile. Normotensive. Heart rate 88. [place] 8100. Hemoglobin 12.0. Sodium 154. Potassium 3.7. Chloride 114. Creatinine 1.22. BUN 17. Glucose 132.  Abdominal x-rays still show multiple air-fluid levels in significantly dilated small bowel loops. Right midabdominal surgical staples and multiple skin clips. Read as stable pattern of SPO2 or marked ileus.   Objective: Vital signs in last 24 hours: Temp:  [98.2 F (36.8 C)-98.7 F (37.1 C)] 98.5 F (36.9 C) 02-Apr-2023 0511) Pulse Rate:  [85-95] 88 2023/04/02 0511) Resp:  [16] 16 2023/04/02 0511) BP: (131-147)/(82-87) 144/85 mmHg 04/02/23 0511) SpO2:  [97 %-98 %] 97 % 02-Apr-2023 0511) Last BM Date: 03/21/15  Intake/Output from previous day: 04/23 0701 - Apr 02, 2023 0700 In: -  Out: 1950 [Urine:650; Emesis/NG output:1300] Intake/Output this shift:    General appearance: Alert. Friendly. In no physical distress whatsoever. Somewhat frustrated because I'm talking to him about whether he needs an operation or not. Resp: clear to auscultation bilaterally GI: Still distended. Seems a bit softer and less distended. Wounds look fine. No tenderness guarding or rebound. No hernia. Bowel sounds hypoactive. No high-pitched bowel sounds or rushes.  Lab Results:   Recent Labs  03/21/15 0443 04/02/15 0340  WBC 5.5 8.1  HGB 11.6* 12.0*  HCT 35.0* 36.6*  PLT 373 420*   BMET  Recent Labs  03/21/15 0443 04-02-15 0340  NA 154* 154*  K 3.5 3.7  CL 115* 114*  CO2 26 30  GLUCOSE 137* 132*  BUN 18 17  CREATININE 1.10 1.22  CALCIUM 8.2* 8.3*   PT/INR No results for input(s): LABPROT, INR in the last 72 hours. ABG No results for input(s): PHART, HCO3 in the last 72  hours.  Invalid input(s): PCO2, PO2  Studies/Results: Dg Abd 2 Views  Apr 02, 2015   CLINICAL DATA:  Followup ileus following gastrointestinal surgery, status post right colectomy on 03/10/2015.  EXAM: ABDOMEN - 2 VIEW  COMPARISON:  None.  FINDINGS: The nasogastric tube tip remains in the proximal stomach. No significant change in multiple air-fluid levels in significantly dilated small bowel loops. Right mid abdominal surgical staples and multiple skin clips. No free peritoneal air is seen. Lumbar and lower thoracic spine degenerative changes.  IMPRESSION: Stable pattern of small bowel obstruction or marked ileus.   Electronically Signed   By: Claudie Revering M.D.   On: 04/02/15 07:39   Dg Abd 2 Views  03/21/2015   CLINICAL DATA:  Followup small bowel obstruction versus adynamic ileus. Pain across lower abdomen, pain is worse when laying flat. Last BM was today. NG tube in place. Colectomy done on 03-10-15  EXAM: ABDOMEN - 2 VIEW  COMPARISON:  Abdominal radiographs, 03/19/2015 and 03/18/2015 and abdominal CT, 03/17/2015.  FINDINGS: There are multiple loops of dilated small bowel with multilevel air-fluid levels on the erect view. No free air.  There is minimal evidence of colonic gas.  Degree of small bowel dilation is similar to the prior to abdominal radiographs.  Nasogastric tube is well positioned in the stomach.  IMPRESSION: High-grade small bowel obstruction, similar to the recent to abdominal radiographs. No free air.   Electronically Signed   By: Lajean Manes M.D.   On: 03/21/2015 09:39    Anti-infectives: Anti-infectives  None      Assessment/Plan:  POD 12. Laparoscopic right colectomy for benign disease.  I have increasing concern that he has an early post op mechanical obstruction such as anastomotic problem, internal hernia, adhesion. Clinically however, he is not completely obstructed and has no sign of bowel compromise. We talked about options and algorithms of care, basically  performing CT scan first or going directly to operating room. I told him we need to resolve this today, if possible. He understands We decided to go ahead with a CT scan of abdomen and pelvis with contrast stat to see if we can determine whether contrast goes through into the colon or not or whether there is a clear-cut transition that would require operative intervention this afternoon.  Relative hypokalemia, hypernatremia, hyperchloremia. Suspect a little bit dehydrated. We'll increase IV rate and continue hypotonic IV fluids. Labs tomorrow.  On Lovenox for DVT prophylaxis.    LOS: 5 days    Shalona Harbour M 03/22/2015

## 2015-03-23 ENCOUNTER — Inpatient Hospital Stay (HOSPITAL_COMMUNITY): Payer: BLUE CROSS/BLUE SHIELD

## 2015-03-23 LAB — CBC
HCT: 37.2 % — ABNORMAL LOW (ref 39.0–52.0)
Hemoglobin: 12.3 g/dL — ABNORMAL LOW (ref 13.0–17.0)
MCH: 26.9 pg (ref 26.0–34.0)
MCHC: 33.1 g/dL (ref 30.0–36.0)
MCV: 81.2 fL (ref 78.0–100.0)
PLATELETS: 426 10*3/uL — AB (ref 150–400)
RBC: 4.58 MIL/uL (ref 4.22–5.81)
RDW: 14.5 % (ref 11.5–15.5)
WBC: 8.1 10*3/uL (ref 4.0–10.5)

## 2015-03-23 LAB — COMPREHENSIVE METABOLIC PANEL
ALBUMIN: 2.8 g/dL — AB (ref 3.5–5.2)
ALT: 86 U/L — AB (ref 0–53)
ANION GAP: 13 (ref 5–15)
AST: 31 U/L (ref 0–37)
Alkaline Phosphatase: 124 U/L — ABNORMAL HIGH (ref 39–117)
BILIRUBIN TOTAL: 1.3 mg/dL — AB (ref 0.3–1.2)
BUN: 11 mg/dL (ref 6–23)
CHLORIDE: 109 mmol/L (ref 96–112)
CO2: 26 mmol/L (ref 19–32)
CREATININE: 1.17 mg/dL (ref 0.50–1.35)
Calcium: 8.4 mg/dL (ref 8.4–10.5)
GFR calc Af Amer: 82 mL/min — ABNORMAL LOW (ref >90)
GFR calc non Af Amer: 71 mL/min — ABNORMAL LOW (ref >90)
Glucose, Bld: 115 mg/dL — ABNORMAL HIGH (ref 70–99)
Potassium: 4.7 mmol/L (ref 3.5–5.1)
SODIUM: 148 mmol/L — AB (ref 135–145)
Total Protein: 6.9 g/dL (ref 6.0–8.3)

## 2015-03-23 MED ORDER — KCL IN DEXTROSE-NACL 20-5-0.45 MEQ/L-%-% IV SOLN
INTRAVENOUS | Status: AC
  Administered 2015-03-23 – 2015-03-24 (×4): via INTRAVENOUS
  Filled 2015-03-23 (×4): qty 1000

## 2015-03-23 MED ORDER — ACETAMINOPHEN 10 MG/ML IV SOLN
1000.0000 mg | Freq: Four times a day (QID) | INTRAVENOUS | Status: AC
  Administered 2015-03-23 – 2015-03-24 (×4): 1000 mg via INTRAVENOUS
  Filled 2015-03-23 (×4): qty 100

## 2015-03-23 NOTE — Progress Notes (Signed)
Subjective: Having a lot of loose bowel movements, frustrated  Objective: Vital signs in last 24 hours: Temp:  [98.3 F (36.8 C)-98.7 F (37.1 C)] 98.3 F (36.8 C) (04/25 0543) Pulse Rate:  [90-94] 90 (04/25 0543) Resp:  [16-17] 17 (04/25 0543) BP: (130-133)/(85-88) 130/87 mmHg (04/25 0543) SpO2:  [96 %-98 %] 96 % (04/25 0543) Last BM Date: 03/22/15  Intake/Output from previous day: 04/24 0701 - 04/25 0700 In: 1925 [I.V.:1925] Out: 2850 [Urine:400; Emesis/NG output:2450] Intake/Output this shift:    General appearance: no distress Resp: clear to auscultation bilaterally Cardio: regular rate and rhythm GI: moderately distended, this is less, some bs present, nontender  Lab Results:   Recent Labs  03/21/15 0443 03/22/15 0340  WBC 5.5 8.1  HGB 11.6* 12.0*  HCT 35.0* 36.6*  PLT 373 420*   BMET  Recent Labs  03/21/15 0443 03/22/15 0340  NA 154* 154*  K 3.5 3.7  CL 115* 114*  CO2 26 30  GLUCOSE 137* 132*  BUN 18 17  CREATININE 1.10 1.22  CALCIUM 8.2* 8.3*   PT/INR No results for input(s): LABPROT, INR in the last 72 hours. ABG No results for input(s): PHART, HCO3 in the last 72 hours.  Invalid input(s): PCO2, PO2  Studies/Results: Ct Abdomen Pelvis W Contrast  03/22/2015   ADDENDUM REPORT: 03/22/2015 19:00  ADDENDUM: At 6 hours since the prior CT imaging, there has been slight interval progression of the previously ingested oral contrast material. However, the contrast remains within the small bowel and has not yet reached the surgical anastomosis. Contrast is a diluted by fluid within the small bowel lumen.  CT findings remain consistent with severe ileus, or distal small bowel obstruction.   Electronically Signed   By: Jacqulynn Cadet M.D.   On: 03/22/2015 19:00   03/22/2015   CLINICAL DATA:  Laparoscopic right colectomy 03/10/2015 for cecal carcinoma. Patient has had test that patient presented 4/19 with abdominal pain, abdominal distention nausea and  vomiting. There has been persistent marked dilation of the small bowel since that time. Evaluate for possible early postop obstruction.  EXAM: CT ABDOMEN AND PELVIS WITH CONTRAST  TECHNIQUE: Multidetector CT imaging of the abdomen and pelvis was performed using the standard protocol following bolus administration of intravenous contrast.  CONTRAST:  100 mL OMNIPAQUE IOHEXOL 300 MG/ML IV.  COMPARISON:  03/17/2015, 01/27/2015.  FINDINGS: Since the CT examination 5 days ago, interval worsening of diffuse small bowel distention. The small bowel is dilated to the level of the surgical anastomosis with the hepatic flexure. The entire colon is decompressed, containing liquid stool. Diffuse colonic diverticulosis is present without evidence of acute diverticulitis. The suture line at the over stone into the colon appears intact. The actual anastomosis is difficult to determine as the oral contrast has not yet made it to the anastomosis. A small amount of free fluid is present in the right paracolic gutter and dependently in the low pelvis, low volume. There is no evidence of free intraperitoneal air. A nasogastric tube is present in the stomach and the oral contrast was administered via the tube.  Normal and stable appearance of the liver, spleen, pancreas, adrenal glands, kidneys and gallbladder. No biliary ductal dilation. Mild right common iliac artery atherosclerosis is present without visible atherosclerosis elsewhere. No significant lymphadenopathy.  Small amount of gas in the urinary bladder, presumably from recent instrumentation. Borderline prostate gland enlargement as noted previously.  Atelectasis involving the left lower lobe.  Right lung base clear.  IMPRESSION: 1.  Interval worsening of diffuse small bowel distention since the CT 5 days ago, and the small bowel is now dilated to the level of the ileocolic surgical anastomosis. The anastomosis is difficult to evaluate as the oral contrast has not yet made it  there, but as best I can tell, the anastomosis appears patent. 2. No evidence of free intraperitoneal air. Low volume ascites in the right paracolic gutter and dependently in the low pelvis. 3. Mild atelectasis involving the left lower lobe.  As the request specifically asked to see whether contrast eventually enters the colon, the patient will return in approximately 6 hr for reimaging to see if indeed the contrast has progressed.  Electronically Signed: By: Evangeline Dakin M.D. On: 03/22/2015 12:38   Dg Abd 2 Views  03/22/2015   CLINICAL DATA:  Followup ileus following gastrointestinal surgery, status post right colectomy on 03/10/2015.  EXAM: ABDOMEN - 2 VIEW  COMPARISON:  None.  FINDINGS: The nasogastric tube tip remains in the proximal stomach. No significant change in multiple air-fluid levels in significantly dilated small bowel loops. Right mid abdominal surgical staples and multiple skin clips. No free peritoneal air is seen. Lumbar and lower thoracic spine degenerative changes.  IMPRESSION: Stable pattern of small bowel obstruction or marked ileus.   Electronically Signed   By: Claudie Revering M.D.   On: 03/22/2015 07:39   Dg Abd 2 Views  03/21/2015   CLINICAL DATA:  Followup small bowel obstruction versus adynamic ileus. Pain across lower abdomen, pain is worse when laying flat. Last BM was today. NG tube in place. Colectomy done on 03-10-15  EXAM: ABDOMEN - 2 VIEW  COMPARISON:  Abdominal radiographs, 03/19/2015 and 03/18/2015 and abdominal CT, 03/17/2015.  FINDINGS: There are multiple loops of dilated small bowel with multilevel air-fluid levels on the erect view. No free air.  There is minimal evidence of colonic gas.  Degree of small bowel dilation is similar to the prior to abdominal radiographs.  Nasogastric tube is well positioned in the stomach.  IMPRESSION: High-grade small bowel obstruction, similar to the recent to abdominal radiographs. No free air.   Electronically Signed   By: Lajean Manes M.D.   On: 03/21/2015 09:39    Anti-infectives: Anti-infectives    None      Assessment/Plan: POD 13. Laparoscopic right colectomy for benign disease.   Ct and repeat shows contrast towards anastomosis, will check film this am.  I am not sure what is going on as he is having fair amount of bms.  I am going to just try to clamp tube today see what happens then discuss with family later today.  If he is not better soon I am going to discuss reoperation to rule out mechanical issue  Relative hypokalemia, hypernatremia, hyperchloremia.  Continue volume and recheck in am  On Lovenox for DVT prophylaxis.  [facility] 03/23/2015

## 2015-03-23 NOTE — Progress Notes (Signed)
Patient ID: [name], male   DOB: 08/30/1961, 54 y.o.   MRN: [id number] Clamped tube and he had bloating, still having some stools. He states there have been three watery stools with "some chunks" today and with some air.  I think he has high grade partial obstruction vs ileus.  I am more concerned this may be mechanical.  All other parameters including vitals and wbc are normal. I would like to have this resolve conservatively. I had long discussion with he and his family about giving this another 48 hours. If it is not better I recommended laparotomy.  We will plan to start tna tomorrow also.

## 2015-03-24 ENCOUNTER — Inpatient Hospital Stay (HOSPITAL_COMMUNITY): Payer: BLUE CROSS/BLUE SHIELD

## 2015-03-24 LAB — BASIC METABOLIC PANEL
Anion gap: 10 (ref 5–15)
BUN: 11 mg/dL (ref 6–23)
CHLORIDE: 111 mmol/L (ref 96–112)
CO2: 28 mmol/L (ref 19–32)
CREATININE: 1.09 mg/dL (ref 0.50–1.35)
Calcium: 8.5 mg/dL (ref 8.4–10.5)
GFR calc Af Amer: 89 mL/min — ABNORMAL LOW (ref >90)
GFR calc non Af Amer: 77 mL/min — ABNORMAL LOW (ref >90)
Glucose, Bld: 130 mg/dL — ABNORMAL HIGH (ref 70–99)
Potassium: 3.6 mmol/L (ref 3.5–5.1)
SODIUM: 149 mmol/L — AB (ref 135–145)

## 2015-03-24 LAB — CULTURE, BLOOD (ROUTINE X 2)
CULTURE: NO GROWTH
Culture: NO GROWTH

## 2015-03-24 LAB — GLUCOSE, CAPILLARY
GLUCOSE-CAPILLARY: 118 mg/dL — AB (ref 70–99)
Glucose-Capillary: 130 mg/dL — ABNORMAL HIGH (ref 70–99)
Glucose-Capillary: 106 mg/dL — ABNORMAL HIGH (ref 70–99)

## 2015-03-24 LAB — MAGNESIUM: MAGNESIUM: 2.1 mg/dL (ref 1.5–2.5)

## 2015-03-24 LAB — PHOSPHORUS: PHOSPHORUS: 4.7 mg/dL — AB (ref 2.3–4.6)

## 2015-03-24 MED ORDER — KCL IN DEXTROSE-NACL 20-5-0.45 MEQ/L-%-% IV SOLN
INTRAVENOUS | Status: AC
  Administered 2015-03-24: 21:00:00 via INTRAVENOUS
  Filled 2015-03-24 (×2): qty 1000

## 2015-03-24 MED ORDER — SODIUM CHLORIDE 0.9 % IJ SOLN
10.0000 mL | Freq: Two times a day (BID) | INTRAMUSCULAR | Status: DC
  Administered 2015-03-25: 20 mL
  Administered 2015-03-28 – 2015-03-29 (×4): 10 mL

## 2015-03-24 MED ORDER — TRACE MINERALS CR-CU-F-FE-I-MN-MO-SE-ZN IV SOLN
INTRAVENOUS | Status: AC
  Administered 2015-03-24: 19:00:00 via INTRAVENOUS
  Filled 2015-03-24: qty 960

## 2015-03-24 MED ORDER — CHLORHEXIDINE GLUCONATE 0.12 % MT SOLN
15.0000 mL | Freq: Two times a day (BID) | OROMUCOSAL | Status: DC
  Administered 2015-03-24 – 2015-03-29 (×11): 15 mL via OROMUCOSAL
  Filled 2015-03-24 (×11): qty 15

## 2015-03-24 MED ORDER — INSULIN ASPART 100 UNIT/ML ~~LOC~~ SOLN
0.0000 [IU] | SUBCUTANEOUS | Status: DC
  Administered 2015-03-25 (×2): 1 [IU] via SUBCUTANEOUS
  Administered 2015-03-25: 2 [IU] via SUBCUTANEOUS
  Administered 2015-03-26 (×2): 1 [IU] via SUBCUTANEOUS
  Administered 2015-03-26: 2 [IU] via SUBCUTANEOUS
  Administered 2015-03-26: 1 [IU] via SUBCUTANEOUS
  Administered 2015-03-27: 2 [IU] via SUBCUTANEOUS
  Administered 2015-03-27 – 2015-03-28 (×5): 1 [IU] via SUBCUTANEOUS

## 2015-03-24 MED ORDER — CETYLPYRIDINIUM CHLORIDE 0.05 % MT LIQD
7.0000 mL | Freq: Two times a day (BID) | OROMUCOSAL | Status: DC
  Administered 2015-03-24 – 2015-03-29 (×9): 7 mL via OROMUCOSAL

## 2015-03-24 MED ORDER — FAT EMULSION 20 % IV EMUL
240.0000 mL | INTRAVENOUS | Status: AC
  Administered 2015-03-24: 240 mL via INTRAVENOUS
  Filled 2015-03-24: qty 250

## 2015-03-24 MED ORDER — POTASSIUM CHLORIDE 10 MEQ/50ML IV SOLN
10.0000 meq | INTRAVENOUS | Status: AC
  Administered 2015-03-24 (×4): 10 meq via INTRAVENOUS
  Filled 2015-03-24 (×5): qty 50

## 2015-03-24 NOTE — Progress Notes (Signed)
Subjective: Did not tolerate tube being clamped yesterday.  Had some loose stool overnight again.  Feels better with tube hooked back up to suction  Objective: Vital signs in last 24 hours: Temp:  [97.9 F (36.6 C)-98.2 F (36.8 C)] 98.1 F (36.7 C) (04/26 0551) Pulse Rate:  [85-93] 85 (04/26 0551) Resp:  [17-18] 18 (04/26 0551) BP: (118-129)/(75-84) 118/75 mmHg (04/26 0551) SpO2:  [97 %-99 %] 99 % (04/26 0551) Last BM Date: 03/23/15  Intake/Output from previous day: 04/25 0701 - 04/26 0700 In: 2663.7 [I.V.:2463.7; IV Piggyback:200] Out: 2300 [Emesis/NG output:2300] Intake/Output this shift: Total I/O In: 1622 [I.V.:1422; IV Piggyback:200] Out: 1700 [Emesis/NG output:1700]  General appearance: no distress Resp: clear to auscultation bilaterally Cardio: regular rate and rhythm GI: moderate distended, some bs, nontender, incisions clean  Lab Results:   Recent Labs  03/22/15 0340 03/23/15 0550  WBC 8.1 8.1  HGB 12.0* 12.3*  HCT 36.6* 37.2*  PLT 420* 426*   BMET  Recent Labs  03/22/15 0340 03/23/15 0550  NA 154* 148*  K 3.7 4.7  CL 114* 109  CO2 30 26  GLUCOSE 132* 115*  BUN 17 11  CREATININE 1.22 1.17  CALCIUM 8.3* 8.4   PT/INR No results for input(s): LABPROT, INR in the last 72 hours. ABG No results for input(s): PHART, HCO3 in the last 72 hours.  Invalid input(s): PCO2, PO2  Studies/Results: Ct Abdomen Pelvis W Contrast  03/22/2015   ADDENDUM REPORT: 03/22/2015 19:00  ADDENDUM: At 6 hours since the prior CT imaging, there has been slight interval progression of the previously ingested oral contrast material. However, the contrast remains within the small bowel and has not yet reached the surgical anastomosis. Contrast is a diluted by fluid within the small bowel lumen.  CT findings remain consistent with severe ileus, or distal small bowel obstruction.   Electronically Signed   By: Jacqulynn Cadet M.D.   On: 03/22/2015 19:00   03/22/2015    CLINICAL DATA:  Laparoscopic right colectomy 03/10/2015 for cecal carcinoma. Patient has had test that patient presented 4/19 with abdominal pain, abdominal distention nausea and vomiting. There has been persistent marked dilation of the small bowel since that time. Evaluate for possible early postop obstruction.  EXAM: CT ABDOMEN AND PELVIS WITH CONTRAST  TECHNIQUE: Multidetector CT imaging of the abdomen and pelvis was performed using the standard protocol following bolus administration of intravenous contrast.  CONTRAST:  100 mL OMNIPAQUE IOHEXOL 300 MG/ML IV.  COMPARISON:  03/17/2015, 01/27/2015.  FINDINGS: Since the CT examination 5 days ago, interval worsening of diffuse small bowel distention. The small bowel is dilated to the level of the surgical anastomosis with the hepatic flexure. The entire colon is decompressed, containing liquid stool. Diffuse colonic diverticulosis is present without evidence of acute diverticulitis. The suture line at the over stone into the colon appears intact. The actual anastomosis is difficult to determine as the oral contrast has not yet made it to the anastomosis. A small amount of free fluid is present in the right paracolic gutter and dependently in the low pelvis, low volume. There is no evidence of free intraperitoneal air. A nasogastric tube is present in the stomach and the oral contrast was administered via the tube.  Normal and stable appearance of the liver, spleen, pancreas, adrenal glands, kidneys and gallbladder. No biliary ductal dilation. Mild right common iliac artery atherosclerosis is present without visible atherosclerosis elsewhere. No significant lymphadenopathy.  Small amount of gas in the urinary bladder, presumably from  recent instrumentation. Borderline prostate gland enlargement as noted previously.  Atelectasis involving the left lower lobe.  Right lung base clear.  IMPRESSION: 1. Interval worsening of diffuse small bowel distention since the CT 5  days ago, and the small bowel is now dilated to the level of the ileocolic surgical anastomosis. The anastomosis is difficult to evaluate as the oral contrast has not yet made it there, but as best I can tell, the anastomosis appears patent. 2. No evidence of free intraperitoneal air. Low volume ascites in the right paracolic gutter and dependently in the low pelvis. 3. Mild atelectasis involving the left lower lobe.  As the request specifically asked to see whether contrast eventually enters the colon, the patient will return in approximately 6 hr for reimaging to see if indeed the contrast has progressed.  Electronically Signed: By: Evangeline Dakin M.D. On: 03/22/2015 12:38   Dg Abd 2 Views  03/23/2015   CLINICAL DATA:  Ileus, generalized abdominal pain.  EXAM: ABDOMEN - 2 VIEW  COMPARISON:  March 22, 2015.  FINDINGS: Nasogastric tube tip is seen in proximal stomach. Stable dilated small bowel loops are noted with air-fluid levels concerning for ileus or distal small bowel obstruction. Midline surgical staples are noted. No colonic dilatation is noted.  IMPRESSION: Stable dilated small bowel loops with air-fluid levels concerning for postoperative ileus or distal small bowel obstruction. Continued radiographic follow-up is recommended.   Electronically Signed   By: Marijo Conception, M.D.   On: 03/23/2015 07:50   Dg Abd 2 Views  03/22/2015   CLINICAL DATA:  Followup ileus following gastrointestinal surgery, status post right colectomy on 03/10/2015.  EXAM: ABDOMEN - 2 VIEW  COMPARISON:  None.  FINDINGS: The nasogastric tube tip remains in the proximal stomach. No significant change in multiple air-fluid levels in significantly dilated small bowel loops. Right mid abdominal surgical staples and multiple skin clips. No free peritoneal air is seen. Lumbar and lower thoracic spine degenerative changes.  IMPRESSION: Stable pattern of small bowel obstruction or marked ileus.   Electronically Signed   By: Claudie Revering M.D.   On: 03/22/2015 07:39    Anti-infectives: Anti-infectives    None      Assessment/Plan:  POD 14 s/p Laparoscopic right colectomy for benign disease.   Continue current pain meds Still with ileus vs partial obstruction.  I discussed with he and his family last night starting tna and placing picc line.  I am going to give him another 24 hours or so and if does not resolve will plan on return to or to rule out mechanical issue with anastomosis,Cbc remains normal, afebrile, no indication of any bowel compromise, he looks well bmet pending SCDs, Lovenox for DVT prophylaxis.  [facility] 03/24/2015

## 2015-03-24 NOTE — Progress Notes (Signed)
PARENTERAL NUTRITION CONSULT NOTE - INITIAL  Pharmacy Consult for TPN Indication: Prolonged Ileus  Allergies  Allergen Reactions  . Penicillins Hives and Itching    Patient Measurements: Height: 6' (182.9 cm) Weight: 230 lb (104.327 kg) IBW/kg (Calculated) : 77.6 Adjusted Body Weight: 84.2 kg  Vital Signs: Temp: 98.1 F (36.7 C) (04/26 0551) Temp Source: Oral (04/26 0551) BP: 118/75 mmHg (04/26 0551) Pulse Rate: 85 (04/26 0551) Intake/Output from previous day: 04/25 0701 - 04/26 0700 In: 2663.7 [I.V.:2463.7; IV Piggyback:200] Out: 2300 [Emesis/NG JSHFWY:6378] Intake/Output from this shift:    Labs:  Recent Labs  03/22/15 0340 03/23/15 0550  WBC 8.1 8.1  HGB 12.0* 12.3*  HCT 36.6* 37.2*  PLT 420* 426*     Recent Labs  03/22/15 0340 03/23/15 0550  NA 154* 148*  K 3.7 4.7  CL 114* 109  CO2 30 26  GLUCOSE 132* 115*  BUN 17 11  CREATININE 1.22 1.17  CALCIUM 8.3* 8.4  PROT  --  6.9  ALBUMIN  --  2.8*  AST  --  31  ALT  --  86*  ALKPHOS  --  124*  BILITOT  --  1.3*   Estimated Creatinine Clearance: 93.3 mL/min (by C-G formula based on Cr of 1.17).   No results for input(s): GLUCAP in the last 72 hours.  Medical History: Past Medical History  Diagnosis Date  . Coronary atherosclerosis of unspecified type of vessel, native or graft   . Pure hypercholesterolemia   . Tobacco use disorder   . Obstructive sleep apnea (adult) (pediatric)   . Problems related to high-risk sexual behavior   . Cellulitis and abscess of unspecified site   . Dysmetabolic syndrome X   . Erectile dysfunction   . Chronic rhinitis   . Hypertrophy of prostate with urinary obstruction and other lower urinary tract symptoms (LUTS)   . Family history of malignant neoplasm of gastrointestinal tract   . Complication of anesthesia     TOOK AWHILE TO GO TO SLEEP   . Cancer   . Diabetes mellitus without complication   . Sickle cell anemia     Insulin Requirements in the past 24  hours:  None  Nutritional Goals:  2000-2200 kCal, 95-105 grams of protein per day per RD note 4/26  Current Nutrition:  NPO  D51/2NS w/ 3mEq of K @ 100 ml/hr (408 kcal)  Assessment: 54 yo M presents on 4/12 with RLQ pain for about a month. Had undergone a colonoscopy that showed a possible mass with overlying clot in the cecum. He was taken to the OR on 4/12 for a R colectomy. Pt was discharged on 4/16 and presented back to the ED on the 19th with decreased PO intake, abdominal distension, and N/V. CT scan showed distended loops of small bowel with concern over potential mechanical obstruction at anastomosis. Failed clamping of NG tube on 4/25 as patient began to feel bloated. NG tube putting out over 2 L each day. Surgery thinks this is either a high grade partial obstruction or an ileus. Plan is for conservative management but if not better within 48 hours will consider laparotomy.  GI: This is day #7 since admit. Albumin is 2.8. Continues to have lots of watery stools. Abdominal xray on 4/25 showed stable dilated small bowel loops concerning for postop ileus or obstruction.  Endo: No hx of DM. CBGs 110-130s.  Lytes: Na 149, K 3.6 (goal of > 4 with ileus), CoCa 9.4, Phos 4.7 (Ca x Phos =  44), Mg 2.1 (goal of > 2 with ileus)  Renal: SCr 1.09, CrCl ~70m/min. 3 urine occurrences documented yesterday. I/Os are about net equal. MIVF: D51/2NS w/ 245m of K @ 100 ml/hr  Pulm: RA  Cards: HR and BP ok.  Hepatobil: AST wnl, ALT slightly elevated at 86, Alk Phos slightly elevated at 124, T bili 1.3  ID: Afebrile, WBC wnl.  Best Practices: Lovenox, PPI  TPN Access: Will place PICC line today  TPN start date: 4/26 >>     Plan:  - Start Clinimix E 5/15 at 4071mr and IVFE at 58m79m. TPN provides 1162 kcal and 48 g of protein. This will provide 50% of kcal needs and 44% of protein needs. - Start MVI and trace elements in TPN - Start sensitive SSI and CBG checks - Give 58mE42m K x 4  runs - Order TPN labs and nurse monitoring - Decrease MIVF to 60ml/39mextra 245 kcal) - F/U Bmet tomorrow   BATCHEElenor Quinones6/2016,7:58 AM

## 2015-03-24 NOTE — Progress Notes (Signed)
Informed PA that pt ate fruit cocktail and apple sauce that his family brought to him. RN educated pt and encouraged him to be compliant.

## 2015-03-24 NOTE — Progress Notes (Signed)
NUTRITION FOLLOW UP  Intervention:   -TPN management per pharmacy -RD will follow for diet advancement and nutritional adequacy  Nutrition Dx:   Inadequate oral intake related to inability to eat as evidenced by NPO; ongoing  Goal:   Pt will meet >90% of estimated nutritional needs; unmet  Monitor:   TPN initiation/advancement/adequacy, Diet advancement, labs, weight changes, I/O's  Assessment:   54 year old male who currently is postoperative day #7 from a laparoscopic right hemicolectomy. Patient comes today he states he's had decreased by mouth over the last 4 days. He states he had some nausea and vomiting today. He's had some abdominal distention. He did have a bowel movement earlier today however was minimal. CT scan revealed distended loops of small bowel. Discussed with the radiologist is concern over potential mechanical obstruction at the anastomosis.  RD received consult for new TPN. Plan is to start today at 1800. Pt remains with abdominal distention. He is having loose stools. Per surgery notes, latest abdominal x-rays concerning for post-operate SBO. CT reveals anastomosis.  Pt failed NGT clamping trial on 03/23/15. NGT now hooked up for suction- noted 2300 ml output in the past 24 hours per doc flowsheets.  Labs reviewed. Na: 138, Glucose: 115.  Height: Ht Readings from Last 1 Encounters:  03/17/15 6' (1.829 m)    Weight Status:   Wt Readings from Last 1 Encounters:  03/17/15 230 lb (104.327 kg)    Re-estimated needs:  Kcal: 2000-2200 Protein: 95-105 grams Fluid: 2.0-2.2 L  Skin: 4 port abdominal incision  Diet Order: Diet NPO time specified Except for: Other (See Comments), Ice Chips   Intake/Output Summary (Last 24 hours) at 03/24/15 1002 Last data filed at 03/24/15 0616  Gross per 24 hour  Intake 2663.67 ml  Output   2300 ml  Net 363.67 ml    Last BM: 03/23/15   Labs:   Recent Labs Lab 03/21/15 0443 03/22/15 0340 03/23/15 0550  NA 154*  154* 148*  K 3.5 3.7 4.7  CL 115* 114* 109  CO2 26 30 26   BUN 18 17 11   CREATININE 1.10 1.22 1.17  CALCIUM 8.2* 8.3* 8.4  GLUCOSE 137* 132* 115*    CBG (last 3)  No results for input(s): GLUCAP in the last 72 hours.  Scheduled Meds: . acetaminophen  1,000 mg Intravenous 4 times per day  . antiseptic oral rinse  7 mL Mouth Rinse q12n4p  . chlorhexidine  15 mL Mouth Rinse BID  . enoxaparin (LOVENOX) injection  40 mg Subcutaneous Q24H  . pantoprazole (PROTONIX) IV  40 mg Intravenous Q12H    Continuous Infusions: . dextrose 5 % and 0.45 % NaCl with KCl 20 mEq/L 100 mL/hr at 03/24/15 0412    Bridgette Wolden A. Jimmye Norman, RD, LDN, CDE Pager: [phone] After hours Pager: [phone]

## 2015-03-24 NOTE — Progress Notes (Signed)
Pt vomited the fruit cocktail and apple sauce. The fruit did not digest. Flushed the nasogastric tube several times because it was not suctioning due to thickness of gastric contents.

## 2015-03-24 NOTE — Progress Notes (Signed)
Peripherally Inserted Central Catheter/Midline Placement  The IV Nurse has discussed with the patient and/or persons authorized to consent for the patient, the purpose of this procedure and the potential benefits and risks involved with this procedure.  The benefits include less needle sticks, lab draws from the catheter and patient may be discharged home with the catheter.  Risks include, but not limited to, infection, bleeding, blood clot (thrombus formation), and puncture of an artery; nerve damage and irregular heat beat.  Alternatives to this procedure were also discussed.  PICC/Midline Placement Documentation  PICC / Midline Double Lumen 74/73/40 PICC Right Basilic 42 cm 1 cm (Active)  Exposed Catheter (cm) 1 cm 03/24/2015  7:13 PM  Dressing Change Due 03/31/15 03/24/2015  7:13 PM       Damaya Channing, Maricela Bo 03/24/2015, 7:14 PM

## 2015-03-25 ENCOUNTER — Inpatient Hospital Stay (HOSPITAL_COMMUNITY): Payer: BLUE CROSS/BLUE SHIELD

## 2015-03-25 LAB — GLUCOSE, CAPILLARY
GLUCOSE-CAPILLARY: 106 mg/dL — AB (ref 70–99)
GLUCOSE-CAPILLARY: 121 mg/dL — AB (ref 70–99)
Glucose-Capillary: 109 mg/dL — ABNORMAL HIGH (ref 70–99)
Glucose-Capillary: 112 mg/dL — ABNORMAL HIGH (ref 70–99)
Glucose-Capillary: 119 mg/dL — ABNORMAL HIGH (ref 70–99)
Glucose-Capillary: 123 mg/dL — ABNORMAL HIGH (ref 70–99)
Glucose-Capillary: 154 mg/dL — ABNORMAL HIGH (ref 70–99)

## 2015-03-25 LAB — BASIC METABOLIC PANEL
ANION GAP: 8 (ref 5–15)
BUN: 11 mg/dL (ref 6–23)
CHLORIDE: 109 mmol/L (ref 96–112)
CO2: 28 mmol/L (ref 19–32)
Calcium: 8.1 mg/dL — ABNORMAL LOW (ref 8.4–10.5)
Creatinine, Ser: 1.02 mg/dL (ref 0.50–1.35)
GFR calc non Af Amer: 83 mL/min — ABNORMAL LOW (ref >90)
Glucose, Bld: 129 mg/dL — ABNORMAL HIGH (ref 70–99)
POTASSIUM: 3.7 mmol/L (ref 3.5–5.1)
SODIUM: 145 mmol/L (ref 135–145)

## 2015-03-25 MED ORDER — POTASSIUM CHLORIDE 10 MEQ/50ML IV SOLN
10.0000 meq | INTRAVENOUS | Status: AC
  Administered 2015-03-25 (×4): 10 meq via INTRAVENOUS
  Filled 2015-03-25 (×4): qty 50

## 2015-03-25 MED ORDER — TRACE MINERALS CR-CU-F-FE-I-MN-MO-SE-ZN IV SOLN
INTRAVENOUS | Status: AC
  Administered 2015-03-25: 19:00:00 via INTRAVENOUS
  Filled 2015-03-25: qty 1992

## 2015-03-25 MED ORDER — FAT EMULSION 20 % IV EMUL
240.0000 mL | INTRAVENOUS | Status: AC
  Administered 2015-03-25: 240 mL via INTRAVENOUS
  Filled 2015-03-25: qty 250

## 2015-03-25 MED ORDER — KCL IN DEXTROSE-NACL 40-5-0.45 MEQ/L-%-% IV SOLN
INTRAVENOUS | Status: DC
  Administered 2015-03-26: 13:00:00 via INTRAVENOUS
  Administered 2015-03-28: 20 mL via INTRAVENOUS
  Filled 2015-03-25 (×4): qty 1000

## 2015-03-25 NOTE — Progress Notes (Signed)
PARENTERAL NUTRITION CONSULT NOTE - Follow-up  Pharmacy Consult for TPN Indication: Prolonged Ileus  Allergies  Allergen Reactions  . Penicillins Hives and Itching    Patient Measurements: Height: 6' (182.9 cm) Weight: 230 lb (104.327 kg) IBW/kg (Calculated) : 77.6 Adjusted Body Weight: 84.2 kg  Vital Signs: Temp: 98.6 F (37 C) (04/27 0634) Temp Source: Oral (04/27 0634) BP: 133/90 mmHg (04/27 0634) Pulse Rate: 86 (04/27 0634) Intake/Output from previous day: 04/26 0701 - 04/27 0700 In: 1320 [P.O.:125; I.V.:700; TPN:495] Out: 1290 [Emesis/NG output:1290] Intake/Output from this shift: Total I/O In: 118 [P.O.:118] Out: -   Labs:  Recent Labs  03/23/15 0550  WBC 8.1  HGB 12.3*  HCT 37.2*  PLT 426*     Recent Labs  03/23/15 0550 03/24/15 1010 03/25/15 0515  NA 148* 149* 145  K 4.7 3.6 3.7  CL 109 111 109  CO2 26 28 28   GLUCOSE 115* 130* 129*  BUN 11 11 11   CREATININE 1.17 1.09 1.02  CALCIUM 8.4 8.5 8.1*  MG  --  2.1  --   PHOS  --  4.7*  --   PROT 6.9  --   --   ALBUMIN 2.8*  --   --   AST 31  --   --   ALT 86*  --   --   ALKPHOS 124*  --   --   BILITOT 1.3*  --   --    Estimated Creatinine Clearance: 107 mL/min (by C-G formula based on Cr of 1.02).    Recent Labs  03/25/15 03/25/15 0401 03/25/15 0800  GLUCAP 121* 106* 123*    Insulin Requirements in the past 24 hours:  2 units Novolog SSI  Assessment: 54 yo M presents on 4/12 with RLQ pain for about a month. Had undergone a colonoscopy that showed a possible mass with overlying clot in the cecum. He was taken to the OR on 4/12 for a R colectomy. Pt was discharged on 4/16 and presented back to the ED on the 19th with decreased PO intake, abdominal distension, and N/V. CT scan showed distended loops of small bowel with concern over potential mechanical obstruction at anastomosis. Failed clamping of NG tube on 4/25 as pt began to feel bloated. Surgery thinks this is either a high grade  partial obstruction or an ileus. Plan is for conservative management but if not better within 48 hours will consider laparotomy.  GI: Pt now with +flatus, +BS, and continues with loose stools. NGT output down to 1.3L yesterday. Noted pt tried to eat fruit cocktail yesterday but vomited. Plan to re-image today. Abdominal xray on 4/25 showed stable dilated small bowel loops concerning for postop ileus or obstruction. Albumin 2.8, prealbumin pending.  Endo: No hx of DM. CBGs well controlled on SSI.  Lytes: Na 145, K 3.7 (goal of > 4 with ileus), CoCa 9.1, Phos 4.7 (Ca x Phos = 43), Mg 2.1 (goal of > 2 with ileus)  Renal: SCr stable, CrCl ~38m/min. I/O not documented appropriately. MIVF: D51/2NS w/ 244m of K @ 60 ml/hr  Hepatobil: AST wnl, ALT slightly elevated at 86, Alk Phos slightly elevated at 124, T bili 1.3.   ID: Afebrile, WBC wnl.  Best Practices: Lovenox, PPI  TPN Access: PICC 4/26  TPN start date: 4/26 >>   Nutritional Goals:  2000-2200 kCal, 95-105 grams of protein per day per RD note 4/26  Current Nutrition:  NPO  Clinimix E 5/15 at 4041mr and 20% IVFE at 37m29m  which will provide 1162 kcal and 48 g of protein. D51/2NS w/ 12mq of K @ 60 ml/hr (245 kcal)    Plan:  - Increase Clinimix E 5/15 to 836mhr and 20% IVFE at 1067mr which will provide 1894 kcal and 100 g of protein. This will meet 95% of kcal needs and 100% of protein needs. - Daily MVI and trace elements in TPN - Continue sensitive SSI and CBG q6h - Change MIVF to D51/2NS with 67m51mCl at 20ml39mwhen new TPN hangs at 1800 (extra 80 kcal) - 10mEq2mK x 4 runs -  F/u TPN labs  Jesus Poplin Sherlon HandingmD, BCPS Clinical pharmacist, pager [phone],9:15 AM

## 2015-03-25 NOTE — Progress Notes (Signed)
  Subjective: Is finally passing some flatus, stomach feels better, he did attempt to eat fruit cocktail yesterday and vomited, had some loose stool also  Objective: Vital signs in last 24 hours: Temp:  [98.6 F (37 C)-98.7 F (37.1 C)] 98.6 F (37 C) (04/27 0634) Pulse Rate:  [86-94] 86 (04/27 0634) Resp:  [16] 16 (04/27 0634) BP: (133-134)/(86-90) 133/90 mmHg (04/27 0634) SpO2:  [97 %] 97 % (04/27 0634) Last BM Date: 03/25/15  Intake/Output from previous day: 04/26 0701 - 04/27 0700 In: 1320 [P.O.:125; I.V.:700; TPN:495] Out: 1290 [Emesis/NG output:1290] Intake/Output this shift:    GI: moderate distention but this is softer, incisions clean without infection, nontender, bs are present  Lab Results:   Recent Labs  03/23/15 0550  WBC 8.1  HGB 12.3*  HCT 37.2*  PLT 426*   BMET  Recent Labs  03/24/15 1010 03/25/15 0515  NA 149* 145  K 3.6 3.7  CL 111 109  CO2 28 28  GLUCOSE 130* 129*  BUN 11 11  CREATININE 1.09 1.02  CALCIUM 8.5 8.1*   PT/INR No results for input(s): LABPROT, INR in the last 72 hours. ABG No results for input(s): PHART, HCO3 in the last 72 hours.  Invalid input(s): PCO2, PO2  Studies/Results: Dg Abd 1 View  03/24/2015   CLINICAL DATA:  Abdominal pain. Small bowel obstruction post abdominal surgery.  EXAM: ABDOMEN - 1 VIEW  COMPARISON:  03/23/2015 and 03/22/2015 as well as CT 03/22/2015.  FINDINGS: There are skin staples vertically over the mid abdomen. There is a surgical suture line over bowel loops in the lateral right mid abdomen. There are a few other scattered skin staples over the lower abdomen/ pelvis and left upper quadrant. Air-filled moderately dilated small bowel loops persist in the central abdomen measuring up to 6.4 cm in diameter. There is minimal air within the colon. No definite free peritoneal air on this supine film. Remainder of the exam is unchanged.  IMPRESSION: Postsurgical changes compatible with recent surgery for  small bowel obstruction. Persistent air-filled dilated small bowel loops in the central abdomen with minimal colonic air. Findings may be due to postoperative ileus versus persistent small bowel obstruction.   Electronically Signed   By: Marin Olp M.D.   On: 03/24/2015 08:52    Anti-infectives: Anti-infectives    None      Assessment/Plan: POD 14 s/p Laparoscopic right colectomy for benign disease.   Continue current pain meds I am encouraged for first day this may be resolving.  He is passing some flatus and actually has bs.  Will check film and I will return later to discuss. Hopefully this will continue to resolve. Continue tna SCDs, Lovenox for DVT prophylaxis.   [facility] 03/25/2015

## 2015-03-26 LAB — COMPREHENSIVE METABOLIC PANEL
ALT: 111 U/L — ABNORMAL HIGH (ref 0–53)
ANION GAP: 11 (ref 5–15)
AST: 65 U/L — ABNORMAL HIGH (ref 0–37)
Albumin: 2.8 g/dL — ABNORMAL LOW (ref 3.5–5.2)
Alkaline Phosphatase: 147 U/L — ABNORMAL HIGH (ref 39–117)
BUN: 13 mg/dL (ref 6–23)
CALCIUM: 8.6 mg/dL (ref 8.4–10.5)
CO2: 27 mmol/L (ref 19–32)
CREATININE: 1.06 mg/dL (ref 0.50–1.35)
Chloride: 110 mmol/L (ref 96–112)
GFR, EST NON AFRICAN AMERICAN: 80 mL/min — AB (ref >90)
Glucose, Bld: 111 mg/dL — ABNORMAL HIGH (ref 70–99)
POTASSIUM: 4.3 mmol/L (ref 3.5–5.1)
Sodium: 148 mmol/L — ABNORMAL HIGH (ref 135–145)
TOTAL PROTEIN: 7.3 g/dL (ref 6.0–8.3)
Total Bilirubin: 1.5 mg/dL — ABNORMAL HIGH (ref 0.3–1.2)

## 2015-03-26 LAB — GLUCOSE, CAPILLARY
GLUCOSE-CAPILLARY: 146 mg/dL — AB (ref 70–99)
Glucose-Capillary: 110 mg/dL — ABNORMAL HIGH (ref 70–99)
Glucose-Capillary: 124 mg/dL — ABNORMAL HIGH (ref 70–99)
Glucose-Capillary: 183 mg/dL — ABNORMAL HIGH (ref 70–99)

## 2015-03-26 LAB — MAGNESIUM: Magnesium: 2.1 mg/dL (ref 1.5–2.5)

## 2015-03-26 LAB — TRIGLYCERIDES: Triglycerides: 154 mg/dL — ABNORMAL HIGH (ref <150)

## 2015-03-26 LAB — PHOSPHORUS: PHOSPHORUS: 4.9 mg/dL — AB (ref 2.3–4.6)

## 2015-03-26 MED ORDER — TRACE MINERALS CR-CU-F-FE-I-MN-MO-SE-ZN IV SOLN
INTRAVENOUS | Status: AC
  Administered 2015-03-26: 18:00:00 via INTRAVENOUS
  Filled 2015-03-26: qty 1992

## 2015-03-26 MED ORDER — METOCLOPRAMIDE HCL 5 MG/ML IJ SOLN
5.0000 mg | Freq: Once | INTRAMUSCULAR | Status: AC
  Administered 2015-03-26: 5 mg via INTRAVENOUS
  Filled 2015-03-26: qty 2

## 2015-03-26 MED ORDER — FAT EMULSION 20 % IV EMUL
240.0000 mL | INTRAVENOUS | Status: AC
  Administered 2015-03-26: 240 mL via INTRAVENOUS
  Filled 2015-03-26: qty 250

## 2015-03-26 NOTE — Progress Notes (Signed)
PARENTERAL NUTRITION CONSULT NOTE - Follow-up  Pharmacy Consult for TPN Indication: Prolonged Ileus  Allergies  Allergen Reactions  . Penicillins Hives and Itching    Patient Measurements: Height: 6' (182.9 cm) Weight: 230 lb (104.327 kg) IBW/kg (Calculated) : 77.6 Adjusted Body Weight: 84.2 kg  Vital Signs: Temp: 99 F (37.2 C) (04/28 0525) Temp Source: Oral (04/28 0525) BP: 133/81 mmHg (04/28 0525) Pulse Rate: 85 (04/28 0525) Intake/Output from previous day: 04/27 0701 - 04/28 0700 In: 1925.3 [P.O.:722; I.V.:620; TPN:583.3] Out: 3495 [Urine:1650; Emesis/NG NFAOZH:0865] Intake/Output from this shift:    Labs: No results for input(s): WBC, HGB, HCT, PLT, APTT, INR in the last 72 hours.   Recent Labs  03/24/15 1010 03/25/15 0515 03/26/15 0530  NA 149* 145 148*  K 3.6 3.7 4.3  CL 111 109 110  CO2 28 28 27   GLUCOSE 130* 129* 111*  BUN 11 11 13   CREATININE 1.09 1.02 1.06  CALCIUM 8.5 8.1* 8.6  MG 2.1  --  2.1  PHOS 4.7*  --  4.9*  PROT  --   --  7.3  ALBUMIN  --   --  2.8*  AST  --   --  65*  ALT  --   --  111*  ALKPHOS  --   --  147*  BILITOT  --   --  1.5*  TRIG  --   --  154*   Estimated Creatinine Clearance: 103 mL/min (by C-G formula based on Cr of 1.06).    Recent Labs  03/25/15 2349 03/26/15 0405 03/26/15 0744  GLUCAP 154* 110* 146*    Insulin Requirements in the past 24 hours:  3 units Novolog SSI  Assessment: 54 yo M presents on 4/12 with RLQ pain for about a month. Had undergone a colonoscopy that showed a possible mass with overlying clot in the cecum. He was taken to the OR on 4/12 for a R colectomy. Pt was discharged on 4/16 and presented back to the ED on the 19th with decreased PO intake, abdominal distension, and N/V. CT scan showed distended loops of small bowel with concern over potential mechanical obstruction at anastomosis. Failed clamping of NG tube on 4/25 as pt began to feel bloated and TPN started 4/26.   GI: Pt now with  +flatus, +BS, +BM. Appears that ileus is resolving. NGT output 1.8L yesterday - plan to clamp NGT today. Reglan x 1 4/28. Albumin 2.8, prealbumin pending.  Endo: No hx of DM. CBGs well controlled on SSI.  Lytes: Na 148, K 4.3 (goal of > 4 with ileus), CoCa 9.5, Phos 4.6 (Ca x Phos = 44), Mg 2.1 (goal of > 2 with ileus)  Renal: SCr stable, CrCl ~48ml/min. I/O not documented appropriately. MIVF: D51/2NS w/ 20mEq of K @ 20 ml/hr  Hepatobil: LFTs trending up. TG 154  ID: Afeb, WBC wnl.  Best Practices: Lovenox, PPI IV  TPN Access: PICC 4/26  TPN start date: 4/26 >>   Nutritional Goals:  2000-2200 kCal, 95-105 grams of protein per day per RD note 4/26  Current Nutrition:  NPO  Clinimix E 5/15 at 85ml/hr and 20% IVFE at 29ml/hr which will provide 1894 kcal and 100 g of protein. D51/2NS w/ 88mEq of K @ 20 ml/hr provide 80 kcal    Plan:  - Continue Clinimix E 5/15 to 72ml/hr and 20% IVFE at 19ml/hr which will provide 1894 kcal and 100 g of protein. This will meet 95% of kcal needs and 100% of protein  needs. - Daily MVI and trace elements in TPN - Continue sensitive SSI and CBG q6h. Will d/c tomorrow if remains well controlled. - F/u TPN labs per protocol - F/u tolerance of NG clamping and ability to initiate po diet  Sherlon Handing, PharmD, BCPS Clinical pharmacist, pager [phone] 03/26/2015,8:42 AM

## 2015-03-26 NOTE — Progress Notes (Signed)
  Subjective: Ng output is just what he is taking in, feels fine, mouth dry, passing flatus still had small bm  Objective: Vital signs in last 24 hours: Temp:  [97.6 F (36.4 C)-99 F (37.2 C)] 99 F (37.2 C) (04/28 0525) Pulse Rate:  [85-88] 85 (04/28 0525) Resp:  [16-18] 18 (04/28 0525) BP: (127-136)/(77-96) 133/81 mmHg (04/28 0525) SpO2:  [97 %-99 %] 98 % (04/28 0525) Last BM Date: 03/25/15  Intake/Output from previous day: 04/27 0701 - 04/28 0700 In: 1925.3 [P.O.:722; I.V.:620; TPN:583.3] Out: 3495 [Urine:1650; Emesis/NG IRJJOA:4166] Intake/Output this shift:    Resp: clear to auscultation bilaterally Cardio: regular rate and rhythm GI: softer, bs present wounds clean, still mild distended  Lab Results:  No results for input(s): WBC, HGB, HCT, PLT in the last 72 hours. BMET  Recent Labs  03/25/15 0515 03/26/15 0530  NA 145 148*  K 3.7 4.3  CL 109 110  CO2 28 27  GLUCOSE 129* 111*  BUN 11 13  CREATININE 1.02 1.06  CALCIUM 8.1* 8.6   PT/INR No results for input(s): LABPROT, INR in the last 72 hours. ABG No results for input(s): PHART, HCO3 in the last 72 hours.  Invalid input(s): PCO2, PO2  Studies/Results: Dg Abd 1 View  03/24/2015   CLINICAL DATA:  Abdominal pain. Small bowel obstruction post abdominal surgery.  EXAM: ABDOMEN - 1 VIEW  COMPARISON:  03/23/2015 and 03/22/2015 as well as CT 03/22/2015.  FINDINGS: There are skin staples vertically over the mid abdomen. There is a surgical suture line over bowel loops in the lateral right mid abdomen. There are a few other scattered skin staples over the lower abdomen/ pelvis and left upper quadrant. Air-filled moderately dilated small bowel loops persist in the central abdomen measuring up to 6.4 cm in diameter. There is minimal air within the colon. No definite free peritoneal air on this supine film. Remainder of the exam is unchanged.  IMPRESSION: Postsurgical changes compatible with recent surgery for small  bowel obstruction. Persistent air-filled dilated small bowel loops in the central abdomen with minimal colonic air. Findings may be due to postoperative ileus versus persistent small bowel obstruction.   Electronically Signed   By: Marin Olp M.D.   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 Followup small bowel obstruction versus ileus.  EXAM: PORTABLE ABDOMEN - 1 VIEW  COMPARISON:  03/24/2015  FINDINGS: Mild to moderate small bowel dilatation is again seen limited in the central abdomen and pelvis, however this shows improvement when compared with previous study. Midline skin staples again noted.  IMPRESSION: Decreased small bowel dilatation compared to prior exam.   Electronically Signed   By: Earle Gell M.D.   On: 03/25/2015 16:45    Anti-infectives: Anti-infectives    None      Assessment/Plan: POD 15 s/p Laparoscopic right colectomy for benign disease.   Continue current pain meds I think this is resolving.  Will clamp ng today and see how he does. Continue tna SCDs, Lovenox for DVT prophylaxis.  [facility] 03/26/2015

## 2015-03-27 LAB — GLUCOSE, CAPILLARY
GLUCOSE-CAPILLARY: 110 mg/dL — AB (ref 70–99)
GLUCOSE-CAPILLARY: 128 mg/dL — AB (ref 70–99)
Glucose-Capillary: 115 mg/dL — ABNORMAL HIGH (ref 70–99)
Glucose-Capillary: 118 mg/dL — ABNORMAL HIGH (ref 70–99)
Glucose-Capillary: 137 mg/dL — ABNORMAL HIGH (ref 70–99)
Glucose-Capillary: 140 mg/dL — ABNORMAL HIGH (ref 70–99)
Glucose-Capillary: 151 mg/dL — ABNORMAL HIGH (ref 70–99)

## 2015-03-27 LAB — PREALBUMIN: Prealbumin: 16 mg/dL — ABNORMAL LOW (ref 21–43)

## 2015-03-27 LAB — CLOSTRIDIUM DIFFICILE BY PCR: CDIFFPCR: POSITIVE — AB

## 2015-03-27 MED ORDER — FAT EMULSION 20 % IV EMUL
240.0000 mL | INTRAVENOUS | Status: AC
  Administered 2015-03-27: 240 mL via INTRAVENOUS
  Filled 2015-03-27: qty 250

## 2015-03-27 MED ORDER — TRACE MINERALS CR-CU-F-FE-I-MN-MO-SE-ZN IV SOLN
INTRAVENOUS | Status: AC
  Administered 2015-03-27: 17:00:00 via INTRAVENOUS
  Filled 2015-03-27: qty 1200

## 2015-03-27 MED ORDER — METOCLOPRAMIDE HCL 5 MG/ML IJ SOLN: 5.0000 mg | Freq: Three times a day (TID) | INTRAMUSCULAR | Status: DC | PRN

## 2015-03-27 MED ORDER — METRONIDAZOLE 500 MG PO TABS
500.0000 mg | ORAL_TABLET | Freq: Three times a day (TID) | ORAL | Status: DC
  Administered 2015-03-27 – 2015-03-30 (×8): 500 mg via ORAL
  Filled 2015-03-27 (×8): qty 1

## 2015-03-27 NOTE — Progress Notes (Signed)
PARENTERAL NUTRITION CONSULT NOTE - Follow-up  Pharmacy Consult for TPN Indication: Prolonged Ileus  Allergies  Allergen Reactions  . Penicillins Hives and Itching    Patient Measurements: Height: 6' (182.9 cm) Weight: 230 lb (104.327 kg) IBW/kg (Calculated) : 77.6 Adjusted Body Weight: 84.2 kg  Vital Signs: Temp: 98 F (36.7 C) (04/29 0522) Temp Source: Oral (04/29 0522) BP: 132/97 mmHg (04/29 0522) Pulse Rate: 105 (04/29 0522) Intake/Output from previous day: 04/28 0701 - 04/29 0700 In: 0174 [P.O.:236; I.V.:220; TPN:1023] Out: 890 [Urine:890] Intake/Output from this shift:    Labs: No results for input(s): WBC, HGB, HCT, PLT, APTT, INR in the last 72 hours.   Recent Labs  03/24/15 1010 03/25/15 0515 03/26/15 0530  NA 149* 145 148*  K 3.6 3.7 4.3  CL 111 109 110  CO2 28 28 27   GLUCOSE 130* 129* 111*  BUN 11 11 13   CREATININE 1.09 1.02 1.06  CALCIUM 8.5 8.1* 8.6  MG 2.1  --  2.1  PHOS 4.7*  --  4.9*  PROT  --   --  7.3  ALBUMIN  --   --  2.8*  AST  --   --  65*  ALT  --   --  111*  ALKPHOS  --   --  147*  BILITOT  --   --  1.5*  PREALBUMIN  --   --  16*  TRIG  --   --  154*   Estimated Creatinine Clearance: 103 mL/min (by C-G formula based on Cr of 1.06).    Recent Labs  03/26/15 2345 03/27/15 0354 03/27/15 0742  GLUCAP 137* 110* 115*    Insulin Requirements in the past 24 hours:  4 units Novolog SSI  Assessment: 54 yo M presents on 4/12 with RLQ pain for about a month. Had undergone a colonoscopy that showed a possible mass with overlying clot in the cecum. He was taken to the OR on 4/12 for a R colectomy. Pt was discharged on 4/16 and presented back to the ED on the 19th with decreased PO intake, abdominal distension, and N/V. CT scan showed distended loops of small bowel with concern over potential mechanical obstruction at anastomosis. Failed clamping of NG tube on 4/25 as pt began to feel bloated and TPN started 4/26.   GI: Pt now with  +flatus, +BS, +BM. Appears that ileus is resolving. NGT out, no n/v reported.MD wishes to half TPN today  Albumin 2.8, prealbumin 16  Endo: No hx of DM. CBGs well controlled for the most part on SSI.  Lytes: 4/28  Na 148, K 4.3 (goal of > 4 with ileus), CoCa 9.5, Phos 4.6 (Ca x Phos = 44), Mg 2.1 (goal of > 2 with ileus)  Renal: SCr stable, CrCl ~78ml/min. I/O not documented appropriately. MIVF: D51/2NS w/ 67mEq of K @ 20 ml/hr  Hepatobil: LFTs trending up. TG 154  ID: Afeb, WBC wnl.  Best Practices: Lovenox, PPI IV  TPN Access: PICC 4/26  TPN start date: 4/26 >>   Nutritional Goals:  2000-2200 kCal, 95-105 grams of protein per day per RD note 4/26  Current Nutrition:  Start clearliquids Clinimix E 5/15 at 15ml/hr and 20% IVFE at 11ml/hr which will provide 1894 kcal and 100 g of protein. D51/2NS w/ 15mEq of K @ 20 ml/hr provide 80 kcal    Plan:  - Decrease Clinimix E 5/15 to 50 ml/hr and 20% IVFE at 5ml/hr. - Daily MVI and trace elements in TPN - Continue  sensitive SSI and CBG q6h.  - F/u tolerance of po diet  Thanks for allowing pharmacy to be a part of this patient's care.  Excell Seltzer, PharmD Clinical Pharmacist, [phone]  03/27/2015,8:12 AM

## 2015-03-27 NOTE — Progress Notes (Signed)
  Subjective: Having loose stools some formed with flatus, no n/v since ng out, ambulating  Objective: Vital signs in last 24 hours: Temp:  [98 F (36.7 C)-98.8 F (37.1 C)] 98 F (36.7 C) (04/29 0522) Pulse Rate:  [85-105] 105 (04/29 0522) Resp:  [18-19] 18 (04/29 0522) BP: (123-143)/(76-97) 132/97 mmHg (04/29 0522) SpO2:  [95 %-99 %] 99 % (04/29 0522) Last BM Date: 03/26/15  Intake/Output from previous day: 04/28 0701 - 04/29 0700 In: 1479 [P.O.:236; I.V.:220; TPN:1023] Out: 890 [Urine:890] Intake/Output this shift:    Resp: clear to auscultation bilaterally Cardio: regular rate and rhythm GI: mild distention, bs present wounds clean    Recent Labs  03/25/15 0515 03/26/15 0530  NA 145 148*  K 3.7 4.3  CL 109 110  CO2 28 27  GLUCOSE 129* 111*  BUN 11 13  CREATININE 1.02 1.06  CALCIUM 8.1* 8.6    Studies/Results: Dg Abd Portable 1v  03/25/2015   CLINICAL DATA:  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 Followup small bowel obstruction versus ileus.  EXAM: PORTABLE ABDOMEN - 1 VIEW  COMPARISON:  03/24/2015  FINDINGS: Mild to moderate small bowel dilatation is again seen limited in the central abdomen and pelvis, however this shows improvement when compared with previous study. Midline skin staples again noted.  IMPRESSION: Decreased small bowel dilatation compared to prior exam.   Electronically Signed   By: Earle Gell M.D.   On: 03/25/2015 16:45     Assessment/Plan: POD 16 s/p laparoscopic right colectomy for benign disease.   Continue current pain meds I still think its resolving.  He is having diarrhea and I will check c diff today.  Will give him clear liquids this am Will half tna SCDs, Lovenox for DVT prophylaxis.  [facility] 03/27/2015

## 2015-03-28 LAB — GLUCOSE, CAPILLARY
GLUCOSE-CAPILLARY: 131 mg/dL — AB (ref 70–99)
GLUCOSE-CAPILLARY: 117 mg/dL — AB (ref 70–99)
Glucose-Capillary: 117 mg/dL — ABNORMAL HIGH (ref 70–99)
Glucose-Capillary: 122 mg/dL — ABNORMAL HIGH (ref 70–99)
Glucose-Capillary: 123 mg/dL — ABNORMAL HIGH (ref 70–99)
Glucose-Capillary: 132 mg/dL — ABNORMAL HIGH (ref 70–99)
Glucose-Capillary: 142 mg/dL — ABNORMAL HIGH (ref 70–99)

## 2015-03-28 NOTE — Progress Notes (Signed)
Patient ID: [name], male   DOB: 1961-09-18, 54 y.o.   MRN: [id number] [facility] Progress Note:   * No surgery found *  Subjective: Mental status is clear.  Very talkative;  Doing better.  C dif positive Objective: Vital signs in last 24 hours: Temp:  [98.2 F (36.8 C)-98.9 F (37.2 C)] 98.5 F (36.9 C) (04/30 0555) Pulse Rate:  [84-103] 84 (04/30 0555) Resp:  [18-20] 20 (04/30 0555) BP: (130-146)/(90-96) 146/94 mmHg (04/30 0555) SpO2:  [97 %-99 %] 98 % (04/30 0555)  Intake/Output from previous day: 04/29 0701 - 04/30 0700 In: 1600 [P.O.:720; I.V.:220; TPN:660] Out: 775 [Urine:775] Intake/Output this shift:    Physical Exam: Work of breathing is normal.  Incision is bland;   Lab Results:  Results for orders placed or performed during the hospital encounter of 03/17/15 (from the past 48 hour(s))  Glucose, capillary     Status: Abnormal   Collection Time: 03/26/15 12:52 PM  Result Value Ref Range   Glucose-Capillary 124 (H) 70 - 99 mg/dL   Comment 1 Repeat Test   Glucose, capillary     Status: Abnormal   Collection Time: 03/26/15  4:02 PM  Result Value Ref Range   Glucose-Capillary 118 (H) 70 - 99 mg/dL  Glucose, capillary     Status: Abnormal   Collection Time: 03/26/15  7:55 PM  Result Value Ref Range   Glucose-Capillary 183 (H) 70 - 99 mg/dL  Glucose, capillary     Status: Abnormal   Collection Time: 03/26/15 11:45 PM  Result Value Ref Range   Glucose-Capillary 137 (H) 70 - 99 mg/dL  Glucose, capillary     Status: Abnormal   Collection Time: 03/27/15  3:54 AM  Result Value Ref Range   Glucose-Capillary 110 (H) 70 - 99 mg/dL  Glucose, capillary     Status: Abnormal   Collection Time: 03/27/15  7:42 AM  Result Value Ref Range   Glucose-Capillary 115 (H) 70 - 99 mg/dL   Comment 1 Notify RN   Glucose, capillary     Status: Abnormal   Collection Time: 03/27/15 11:52 AM  Result Value Ref Range   Glucose-Capillary 151 (H) 70 - 99 mg/dL   Comment 1  Notify RN   Glucose, capillary     Status: Abnormal   Collection Time: 03/27/15  4:00 PM  Result Value Ref Range   Glucose-Capillary 140 (H) 70 - 99 mg/dL   Comment 1 Notify RN   Clostridium Difficile by PCR     Status: Abnormal   Collection Time: 03/27/15  5:11 PM  Result Value Ref Range   C difficile by pcr POSITIVE (A) NEGATIVE    Comment: CRITICAL RESULT CALLED TO, READ BACK BY AND VERIFIED WITHJesse Sans RN [id number] 03/27/15 A BROWNING   Glucose, capillary     Status: Abnormal   Collection Time: 03/27/15  7:33 PM  Result Value Ref Range   Glucose-Capillary 128 (H) 70 - 99 mg/dL   Comment 1 Notify RN    Comment 2 Document in Chart   Glucose, capillary     Status: Abnormal   Collection Time: 03/28/15 12:06 AM  Result Value Ref Range   Glucose-Capillary 132 (H) 70 - 99 mg/dL   Comment 1 Notify RN    Comment 2 Document in Chart   Glucose, capillary     Status: Abnormal   Collection Time: 03/28/15  3:36 AM  Result Value Ref Range   Glucose-Capillary 142 (H) 70 -  99 mg/dL   Comment 1 Notify RN    Comment 2 Document in Chart     Radiology/Results: No results found.  Anti-infectives: Anti-infectives    Start     Dose/Rate Route Frequency Ordered Stop   03/27/15 2200  metroNIDAZOLE (FLAGYL) tablet 500 mg     500 mg Oral 3 times per day 03/27/15 1841 04/10/15 2159      Assessment/Plan: Problem List: Patient Active Problem List   Diagnosis Date Noted  . Ileus 03/17/2015  . S/P right colectomy 03/10/2015  . Precordial pain 11/11/2014  . Radiculitis of left cervical region 01/06/2014  . Routine general medical examination at a health care facility 05/21/2012  . OBSTRUCTIVE SLEEP APNEA 12/31/2008  . TOBACCO ABUSE 07/31/2008  . Coronary atherosclerosis 07/31/2008  . HYPERTROPHY PROSTATE W/UR OBST & OTH LUTS 07/31/2008  . Hyperlipidemia with target LDL less than 70 07/25/2008  . ERECTILE DYSFUNCTION 08/25/2007    C dif positive; begun on Flagyl.  Diet advanced today.   Hopeful discharge home tomorrow * No surgery found *    LOS: 11 days   Matt B. Hassell Done, MD, [facility] [phone] beeper [phone]  03/28/2015 8:39 AM

## 2015-03-29 LAB — GLUCOSE, CAPILLARY
GLUCOSE-CAPILLARY: 97 mg/dL (ref 70–99)
GLUCOSE-CAPILLARY: 112 mg/dL — AB (ref 70–99)

## 2015-03-29 MED ORDER — ACETAMINOPHEN 325 MG PO TABS
650.0000 mg | ORAL_TABLET | Freq: Four times a day (QID) | ORAL | Status: DC | PRN
  Administered 2015-03-29: 650 mg via ORAL
  Filled 2015-03-29: qty 2

## 2015-03-29 MED ORDER — PANTOPRAZOLE SODIUM 40 MG PO TBEC
40.0000 mg | DELAYED_RELEASE_TABLET | Freq: Two times a day (BID) | ORAL | Status: DC
  Administered 2015-03-29 – 2015-03-30 (×3): 40 mg via ORAL
  Filled 2015-03-29 (×3): qty 1

## 2015-03-29 MED ORDER — ZOLPIDEM TARTRATE 5 MG PO TABS
5.0000 mg | ORAL_TABLET | Freq: Every evening | ORAL | Status: DC | PRN
  Administered 2015-03-29: 5 mg via ORAL
  Filled 2015-03-29: qty 1

## 2015-03-29 MED ORDER — OXYCODONE-ACETAMINOPHEN 5-325 MG PO TABS
1.0000 | ORAL_TABLET | Freq: Four times a day (QID) | ORAL | Status: DC | PRN
  Administered 2015-03-29: 2 via ORAL
  Filled 2015-03-29: qty 2

## 2015-03-29 NOTE — Progress Notes (Signed)
Patient ID: [name], male   DOB: 10-May-1961, 54 y.o.   MRN: [id number] [facility] Progress Note:   * No surgery found *  Subjective: Mental status is alert.  Had 8 BMs/diarrhea last night almost soiling himself.  Frustrated with me because he wants to go home today.   Objective: Vital signs in last 24 hours: Temp:  [97.9 F (36.6 C)-98.5 F (36.9 C)] 98.4 F (36.9 C) (05/01 0556) Pulse Rate:  [80-90] 80 (05/01 0556) Resp:  [17-18] 17 (05/01 0556) BP: (124-133)/(76-86) 133/86 mmHg (05/01 0556) SpO2:  [99 %-100 %] 100 % (05/01 0556)  Intake/Output from previous day: 04/30 0701 - 05/01 0700 In: 420 [P.O.:200; I.V.:220] Out: -  Intake/Output this shift:    Physical Exam: Work of breathing is normal.  Incision OK.  Since thims morning his frequency of BMs has slowed.  On regular diet.  If this trend continues then he can go home tomorrow.    Lab Results:  Results for orders placed or performed during the hospital encounter of 03/17/15 (from the past 48 hour(s))  Glucose, capillary     Status: Abnormal   Collection Time: 03/27/15 11:52 AM  Result Value Ref Range   Glucose-Capillary 151 (H) 70 - 99 mg/dL   Comment 1 Notify RN   Glucose, capillary     Status: Abnormal   Collection Time: 03/27/15  4:00 PM  Result Value Ref Range   Glucose-Capillary 140 (H) 70 - 99 mg/dL   Comment 1 Notify RN   Clostridium Difficile by PCR     Status: Abnormal   Collection Time: 03/27/15  5:11 PM  Result Value Ref Range   C difficile by pcr POSITIVE (A) NEGATIVE    Comment: CRITICAL RESULT CALLED TO, READ BACK BY AND VERIFIED WITHJesse Sans RN [phone] 03/27/15 A BROWNING   Glucose, capillary     Status: Abnormal   Collection Time: 03/27/15  7:33 PM  Result Value Ref Range   Glucose-Capillary 128 (H) 70 - 99 mg/dL   Comment 1 Notify RN    Comment 2 Document in Chart   Glucose, capillary     Status: Abnormal   Collection Time: 03/28/15 12:06 AM  Result Value Ref Range   Glucose-Capillary 132 (H) 70 - 99 mg/dL   Comment 1 Notify RN    Comment 2 Document in Chart   Glucose, capillary     Status: Abnormal   Collection Time: 03/28/15  3:36 AM  Result Value Ref Range   Glucose-Capillary 142 (H) 70 - 99 mg/dL   Comment 1 Notify RN    Comment 2 Document in Chart   Glucose, capillary     Status: Abnormal   Collection Time: 03/28/15  7:59 AM  Result Value Ref Range   Glucose-Capillary 123 (H) 70 - 99 mg/dL  Glucose, capillary     Status: Abnormal   Collection Time: 03/28/15 12:02 PM  Result Value Ref Range   Glucose-Capillary 131 (H) 70 - 99 mg/dL  Glucose, capillary     Status: Abnormal   Collection Time: 03/28/15  5:20 PM  Result Value Ref Range   Glucose-Capillary 117 (H) 70 - 99 mg/dL  Glucose, capillary     Status: Abnormal   Collection Time: 03/28/15  7:39 PM  Result Value Ref Range   Glucose-Capillary 117 (H) 70 - 99 mg/dL   Comment 1 Notify RN   Glucose, capillary     Status: Abnormal   Collection Time: 03/28/15 11:17 PM  Result Value Ref Range   Glucose-Capillary 122 (H) 70 - 99 mg/dL   Comment 1 Notify RN   Glucose, capillary     Status: None   Collection Time: 03/29/15  3:07 AM  Result Value Ref Range   Glucose-Capillary 97 70 - 99 mg/dL  Glucose, capillary     Status: Abnormal   Collection Time: 03/29/15  7:57 AM  Result Value Ref Range   Glucose-Capillary 112 (H) 70 - 99 mg/dL   Comment 1 Notify RN     Radiology/Results: No results found.  Anti-infectives: Anti-infectives    Start     Dose/Rate Route Frequency Ordered Stop   03/27/15 2200  metroNIDAZOLE (FLAGYL) tablet 500 mg     500 mg Oral 3 times per day 03/27/15 1841 04/10/15 2159      Assessment/Plan: Problem List: Patient Active Problem List   Diagnosis Date Noted  . Ileus 03/17/2015  . S/P right colectomy 03/10/2015  . Precordial pain 11/11/2014  . Radiculitis of left cervical region 01/06/2014  . Routine general medical examination at a health care facility  05/21/2012  . OBSTRUCTIVE SLEEP APNEA 12/31/2008  . TOBACCO ABUSE 07/31/2008  . Coronary atherosclerosis 07/31/2008  . HYPERTROPHY PROSTATE W/UR OBST & OTH LUTS 07/31/2008  . Hyperlipidemia with target LDL less than 70 07/25/2008  . ERECTILE DYSFUNCTION 08/25/2007    Continue oral Flagyl and hopeful discharge tomorrow.   * No surgery found *    LOS: 12 days   Matt B. Hassell Done, MD, [facility] [phone] beeper [phone]  03/29/2015 9:18 AM

## 2015-03-30 MED ORDER — OXYCODONE-ACETAMINOPHEN 5-325 MG PO TABS: 1.0000 | ORAL_TABLET | ORAL | Status: DC | PRN

## 2015-03-30 MED ORDER — METRONIDAZOLE 500 MG PO TABS: 500.0000 mg | ORAL_TABLET | Freq: Three times a day (TID) | ORAL | Status: AC

## 2015-03-30 NOTE — Progress Notes (Signed)
AVS discharge instructions were reviewed with patient. Patient was given prescription for oxycodone/acetaminophen to take to his pharmacy and reminded to pick flagyl from pharmacy. Patient stated that he did not have any questions. Patient ambulated to his transportation.

## 2015-03-30 NOTE — Discharge Summary (Signed)
Physician Discharge Summary  Patient ID: [name] MRN: [id number] DOB/AGE: 1961/04/13 54 y.o.  Admit date: 03/17/2015 Discharge date: 03/30/2015  Admission Diagnoses: ILEUS Patient Active Problem List   Diagnosis Date Noted  . Ileus 03/17/2015  . S/P right colectomy 03/10/2015  . Precordial pain 11/11/2014  . Radiculitis of left cervical region 01/06/2014  . Routine general medical examination at a health care facility 05/21/2012  . OBSTRUCTIVE SLEEP APNEA 12/31/2008  . TOBACCO ABUSE 07/31/2008  . Coronary atherosclerosis 07/31/2008  . HYPERTROPHY PROSTATE W/UR OBST & OTH LUTS 07/31/2008  . Hyperlipidemia with target LDL less than 70 07/25/2008  . ERECTILE DYSFUNCTION 08/25/2007    Discharge Diagnoses:  Active Problems:   Ileus   Discharged Condition: good  Hospital Course: PT admitted on 03/17/2015 with ileus after right hemicolectomy on 03/10/2015 by Dr Dwain Sarna.  He had a NG tube placed and on bowel rest.  He required nutritional supplementation.  And had a slow hospital course.  He developed C diff colitis and was placed on flagyl with a good result and diet was advanced. Abdomen was benign and wounds healing.    Consults: None  Significant Diagnostic Studies: labs:  CBC    Component Value Date/Time   WBC 8.1 03/23/2015 0550   RBC 4.58 03/23/2015 0550   HGB 12.3* 03/23/2015 0550   HCT 37.2* 03/23/2015 0550   PLT 426* 03/23/2015 0550   MCV 81.2 03/23/2015 0550   MCH 26.9 03/23/2015 0550   MCHC 33.1 03/23/2015 0550   RDW 14.5 03/23/2015 0550   LYMPHSABS 0.8 03/17/2015 1825   MONOABS 0.8 03/17/2015 1825   EOSABS 0.1 03/17/2015 1825   BASOSABS 0.1 03/17/2015 1825     Treatments: IV hydration, antibiotics: metronidazole and TPN  Discharge Exam: Blood pressure 122/85, pulse 82, temperature 98.2 F (36.8 C), temperature source Oral, resp. rate 17, height 6' (1.829 m), weight 104.327 kg (230 lb), SpO2 100 %. Incision/Wound:soft non tender.  Disposition:  01-Home or Self Care  Discharge Instructions    Diet - low sodium heart healthy    Complete by:  As directed      Discharge instructions    Complete by:  As directed   Follow up 1 - 2 weeks with dr Dwain Sarna     Increase activity slowly    Complete by:  As directed             Medication List    STOP taking these medications        oxyCODONE-acetaminophen 10-325 MG per tablet  Commonly known as:  PERCOCET  Replaced by:  oxyCODONE-acetaminophen 5-325 MG per tablet      TAKE these medications        aspirin EC 81 MG tablet  Take 81 mg by mouth daily.     ibuprofen 200 MG tablet  Commonly known as:  ADVIL,MOTRIN  Take 600 mg by mouth every 6 (six) hours as needed for mild pain.     metroNIDAZOLE 500 MG tablet  Commonly known as:  FLAGYL  Take 1 tablet (500 mg total) by mouth every 8 (eight) hours.     MULTIVITAMIN PO  Take 1 tablet by mouth daily.     oxyCODONE-acetaminophen 5-325 MG per tablet  Commonly known as:  ROXICET  Take 1 tablet by mouth every 4 (four) hours as needed.     rosuvastatin 40 MG tablet  Commonly known as:  CRESTOR  Take 1 tablet (40 mg total) by mouth daily.  Follow-up Information    Follow up with [facility], [place]. Schedule an appointment as soon as possible for a visit in 2 weeks.   Specialty:  General Surgery   Contact information:   [address] [place] [address] [phone]       Signed: Albana Saperstein A. 03/30/2015, 9:45 AM

## 2015-03-30 NOTE — Progress Notes (Signed)
  Subjective: Diarrhea better  Objective: Vital signs in last 24 hours: Temp:  [98.2 F (36.8 C)-99.3 F (37.4 C)] 98.2 F (36.8 C) (05/02 0518) Pulse Rate:  [81-95] 82 (05/02 0518) Resp:  [16-17] 17 (05/02 0518) BP: (122-128)/(79-85) 122/85 mmHg (05/02 0518) SpO2:  [100 %] 100 % (05/02 0518) Last BM Date: 03/29/15  Intake/Output from previous day: 05/01 0701 - 05/02 0700 In: 1080 [P.O.:1080] Out: -  Intake/Output this shift:    Incision/Wound:intact soft abdomen  Lab Results:  No results for input(s): WBC, HGB, HCT, PLT in the last 72 hours. BMET No results for input(s): NA, K, CL, CO2, GLUCOSE, BUN, CREATININE, CALCIUM in the last 72 hours. PT/INR No results for input(s): LABPROT, INR in the last 72 hours. ABG No results for input(s): PHART, HCO3 in the last 72 hours.  Invalid input(s): PCO2, PO2  Studies/Results: No results found.  Anti-infectives: Anti-infectives    Start     Dose/Rate Route Frequency Ordered Stop   03/27/15 2200  metroNIDAZOLE (FLAGYL) tablet 500 mg     500 mg Oral 3 times per day 03/27/15 1841 04/10/15 2159      Assessment/Plan: Patient Active Problem List   Diagnosis Date Noted  . Ileus 03/17/2015  . S/P right colectomy 03/10/2015  . Precordial pain 11/11/2014  . Radiculitis of left cervical region 01/06/2014  . Routine general medical examination at a health care facility 05/21/2012  . OBSTRUCTIVE SLEEP APNEA 12/31/2008  . TOBACCO ABUSE 07/31/2008  . Coronary atherosclerosis 07/31/2008  . HYPERTROPHY PROSTATE W/UR OBST & OTH LUTS 07/31/2008  . Hyperlipidemia with target LDL less than 70 07/25/2008  . ERECTILE DYSFUNCTION 08/25/2007   Discharge    LOS: 13 days    Plato Alspaugh A. 03/30/2015

## 2015-03-30 NOTE — Discharge Instructions (Signed)
[facility]      [facility] °[phone] ° °OPEN ABDOMINAL SURGERY: POST OP INSTRUCTIONS ° °Always review your discharge instruction sheet given to you by the facility where your surgery was performed. ° °IF YOU HAVE DISABILITY OR FAMILY LEAVE FORMS, YOU MUST BRING THEM TO THE OFFICE FOR PROCESSING.  PLEASE DO NOT GIVE THEM TO YOUR DOCTOR. ° °1. A prescription for pain medication may be given to you upon discharge.  Take your pain medication as prescribed, if needed.  If narcotic pain medicine is not needed, then you may take acetaminophen (Tylenol) or ibuprofen (Advil) as needed. °2. Take your usually prescribed medications unless otherwise directed. °3. If you need a refill on your pain medication, please contact your pharmacy. They will contact our office to request authorization.  Prescriptions will not be filled after 5pm or on week-ends. °4. You should follow a light diet the first few days after arrival home, such as soup and crackers, pudding, etc.unless your doctor has advised otherwise. A high-fiber, low fat diet can be resumed as tolerated.   Be sure to include lots of fluids daily. Most patients will experience some swelling and bruising on the chest and neck area.  Ice packs will help.  Swelling and bruising can take several days to resolve °5. Most patients will experience some swelling and bruising in the area of the incision. Ice pack will help. Swelling and bruising can take several days to resolve..  °6. It is common to experience some constipation if taking pain medication after surgery.  Increasing fluid intake and taking a stool softener will usually help or prevent this problem from occurring.  A mild laxative (Milk of Magnesia or Miralax) should be taken according to package directions if there are no bowel movements after 48 hours. °7.  You may have steri-strips (small skin tapes) in place directly over the incision.  These strips should be left on the skin for 7-10 days.  If your  surgeon used skin glue on the incision, you may shower in 24 hours.  The glue will flake off over the next 2-3 weeks.  Any sutures or staples will be removed at the office during your follow-up visit. You may find that a light gauze bandage over your incision may keep your staples from being rubbed or pulled. You may shower and replace the bandage daily. °8. ACTIVITIES:  You may resume regular (light) daily activities beginning the next day--such as daily self-care, walking, climbing stairs--gradually increasing activities as tolerated.  You may have sexual intercourse when it is comfortable.  Refrain from any heavy lifting or straining until approved by your doctor. °a. You may drive when you no longer are taking prescription pain medication, you can comfortably wear a seatbelt, and you can safely maneuver your car and apply brakes °b. Return to Work: ___________________________________ °9. You should see your doctor in the office for a follow-up appointment approximately two weeks after your surgery.  Make sure that you call for this appointment within a day or two after you arrive home to insure a convenient appointment time. °OTHER INSTRUCTIONS:  °_____________________________________________________________ °_____________________________________________________________ ° °WHEN TO CALL YOUR DOCTOR: °1. Fever over 101.0 °2. Inability to urinate °3. Nausea and/or vomiting °4. Extreme swelling or bruising °5. Continued bleeding from incision. °6. Increased pain, redness, or drainage from the incision. °7. Difficulty swallowing or breathing °8. Muscle cramping or spasms. °9. Numbness or tingling in hands or feet or around lips. ° °The clinic staff is available to   answer your questions during regular business hours.  Please dont hesitate to call and ask to speak to one of the nurses if you have concerns.  For further questions, please visit [email or web address] Clostridium Difficile FAQs What is  Clostridium difficile infection?  Clostridium difficile [pronounced Klo-STRID-ee-um dif-uh-SEEL], also known as "C. diff" [See-dif], is a germ that can cause diarrhea. Most cases of C. diff infection occur in patients taking antibiotics. The most common symptoms of a C. diff infection include:  Watery diarrhea  Fever  Loss of appetite  Nausea  Belly pain and tenderness Who is most likely to get C. diff infection? The elderly and people with certain medical problems have the greatest chance of getting C. diff. C. diff spores can live outside the human body for a very long time and may be found on things in the environment such as bed linens, bed rails, bathroom fixtures, and medical equipment. C. diff infection can spread from person-to-person on contaminated equipment and on the hands of doctors, nurses, other healthcare providers and visitors. Can C. diff infection be treated? Yes, there are antibiotics that can be used to treat C. diff. In some severe cases, a person might have to have surgery to remove the infected part of the intestines. This surgery is needed in only 1 or 2 out of every 100 persons with C. diff. What are some of the things that hospitals are doing to prevent C. diff infections? To prevent C. diff infections, doctors, nurses, and other healthcare providers:  Clean their hands with soap and water or an alcohol-based hand rub before and after caring for every patient. This can prevent C. diff and other germs from being passed from one patient to another on their hands.  Carefully clean hospital rooms and medical equipment that have been used for patients with C. diff.  Use Contact Precautions to prevent C. diff from spreading to other patients. Contact Precautions mean:  Whenever possible, patients with C. diff will have a single room or share a room only with someone else who also has C. diff.  Healthcare providers will put on gloves and wear a gown over their clothing  while taking care of patients with C. diff.  Visitors may also be asked to wear a gown and gloves.  When leaving the room, hospital providers and visitors remove their gown and gloves and clean their hands.  Patients on Contact Precautions are asked to stay in their hospital rooms as much as possible. They should not go to common areas, such as the gift shop or cafeteria. They can go to other areas of the hospital for treatments and tests.  Only give patients antibiotics when it is necessary. What can I do to help prevent C. diff infections?  Make sure that all doctors, nurses, and other healthcare providers clean their hands with soap and water or an alcohol-based hand rub before and after caring for you.  If you do not see your providers clean their hands, please ask them to do so.  Only take antibiotics as prescribed by your doctor.  Be sure to clean your own hands often, especially after using the bathroom and before eating. Can my friends and family get C. diff when they visit me? C. diff infection usually does not occur in persons who are not taking antibiotics. Visitors are not likely to get C. diff. Still, to make it safer for visitors, they should:  Clean their hands before they enter your room and as they leave  your room  Ask the nurse if they need to wear protective gowns and gloves when they visit you. What do I need to do when I go home from the hospital? Once you are back at home, you can return to your normal routine. Often, the diarrhea will be better or completely gone before you go home. This makes giving C. diff to other people much less likely. There are a few things you should do, however, to lower the chances of developing C. diff infection again or of spreading it to others.  If you are given a prescription to treat C. diff, take the medicine exactly as prescribed by your doctor and pharmacist. Do not take half-doses or stop before you run out.  Wash your hands  often, especially after going to the bathroom and before preparing food.  People who live with you should wash their hands often as well.  If you develop more diarrhea after you get home, tell your doctor immediately.  Your doctor may give you additional instructions. If you have questions, please ask your doctor or nurse. Developed and co-sponsored by [employer] for [place] [phone]); Infectious Diseases Society of [place] (IDSA); [place]; Association for Professionals in Infection Control and Epidemiology (APIC); Centers for Disease Control and Prevention (CDC); and [employer]. Document Released: 11/19/2013 Document Reviewed: 11/19/2013 [facility] Patient Information 2015 [place], [place]. This information is not intended to replace advice given to you by your health care provider. Make sure you discuss any questions you have with your health care provider.

## 2015-05-25 ENCOUNTER — Other Ambulatory Visit: Payer: Self-pay

## 2015-08-12 ENCOUNTER — Telehealth: Payer: Self-pay | Admitting: Emergency Medicine

## 2015-08-12 NOTE — Telephone Encounter (Signed)
Pt called in to schedule appt with Dr Ronnald Ramp for Chest tightness. Pt schedule appt with Dr Linna Darner, and was transferred over to the nurse line. There was no answer at the nurse line and was then transferred to the Doc line. Pt asked what the suggestion was to do about the current chest pain. I told the pt multiple times the ED was the best option for chest pain that was occuring multiple times for extended amounts of time. 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 Pt asked if he should go to work tonight and again my suggestion was to no risk driving with the chest pain and to go to the Emergency Room. He stated he was going to take a nap and see how he felt when he got up and decide to go to the ED or not and would be in for his appt tomorrow morning.

## 2015-08-13 ENCOUNTER — Other Ambulatory Visit: Payer: Self-pay | Admitting: Internal Medicine

## 2015-08-13 ENCOUNTER — Other Ambulatory Visit (INDEPENDENT_AMBULATORY_CARE_PROVIDER_SITE_OTHER): Payer: BLUE CROSS/BLUE SHIELD

## 2015-08-13 ENCOUNTER — Ambulatory Visit (INDEPENDENT_AMBULATORY_CARE_PROVIDER_SITE_OTHER)
Admission: RE | Admit: 2015-08-13 | Discharge: 2015-08-13 | Disposition: A | Discharge status: Home / Self Care | Payer: BLUE CROSS/BLUE SHIELD | Source: Ambulatory Visit | Attending: Internal Medicine | Admitting: Internal Medicine

## 2015-08-13 ENCOUNTER — Ambulatory Visit (INDEPENDENT_AMBULATORY_CARE_PROVIDER_SITE_OTHER): Payer: BLUE CROSS/BLUE SHIELD | Admitting: Internal Medicine

## 2015-08-13 ENCOUNTER — Encounter: Payer: Self-pay | Admitting: Internal Medicine

## 2015-08-13 VITALS — BP 122/72 | HR 79 | Temp 98.4°F | Resp 16 | Wt 227.0 lb

## 2015-08-13 DIAGNOSIS — R739 Hyperglycemia, unspecified: Secondary | ICD-10-CM

## 2015-08-13 DIAGNOSIS — R0789 Other chest pain: Secondary | ICD-10-CM

## 2015-08-13 DIAGNOSIS — Z720 Tobacco use: Secondary | ICD-10-CM

## 2015-08-13 DIAGNOSIS — E785 Hyperlipidemia, unspecified: Secondary | ICD-10-CM

## 2015-08-13 DIAGNOSIS — I25118 Atherosclerotic heart disease of native coronary artery with other forms of angina pectoris: Secondary | ICD-10-CM

## 2015-08-13 DIAGNOSIS — F17200 Nicotine dependence, unspecified, uncomplicated: Secondary | ICD-10-CM

## 2015-08-13 DIAGNOSIS — R079 Chest pain, unspecified: Secondary | ICD-10-CM

## 2015-08-13 LAB — CBC WITH DIFFERENTIAL/PLATELET
Basophils Absolute: 0 10*3/uL (ref 0.0–0.1)
Basophils Relative: 0.6 % (ref 0.0–3.0)
EOS PCT: 2.9 % (ref 0.0–5.0)
Eosinophils Absolute: 0.1 10*3/uL (ref 0.0–0.7)
HEMATOCRIT: 41.4 % (ref 39.0–52.0)
Hemoglobin: 13.8 g/dL (ref 13.0–17.0)
LYMPHS ABS: 1.4 10*3/uL (ref 0.7–4.0)
LYMPHS PCT: 28.6 % (ref 12.0–46.0)
MCHC: 33.4 g/dL (ref 30.0–36.0)
MCV: 82.3 fl (ref 78.0–100.0)
MONOS PCT: 9.1 % (ref 3.0–12.0)
Monocytes Absolute: 0.4 10*3/uL (ref 0.1–1.0)
NEUTROS ABS: 2.8 10*3/uL (ref 1.4–7.7)
NEUTROS PCT: 58.8 % (ref 43.0–77.0)
PLATELETS: 226 10*3/uL (ref 150.0–400.0)
RBC: 5.03 Mil/uL (ref 4.22–5.81)
RDW: 13.7 % (ref 11.5–15.5)
WBC: 4.8 10*3/uL (ref 4.0–10.5)

## 2015-08-13 LAB — LIPID PANEL
CHOL/HDL RATIO: 3
Cholesterol: 152 mg/dL (ref 0–200)
HDL: 54.5 mg/dL (ref >39.00)
LDL CALC: 84 mg/dL (ref 0–99)
NONHDL: 97.07
TRIGLYCERIDES: 66 mg/dL (ref 0.0–149.0)
VLDL: 13.2 mg/dL (ref 0.0–40.0)

## 2015-08-13 LAB — HEMOGLOBIN A1C: HEMOGLOBIN A1C: 5.8 % (ref 4.6–6.5)

## 2015-08-13 LAB — HEPATIC FUNCTION PANEL
ALBUMIN: 4.2 g/dL (ref 3.5–5.2)
ALK PHOS: 79 U/L (ref 39–117)
ALT: 26 U/L (ref 0–53)
AST: 16 U/L (ref 0–37)
Bilirubin, Direct: 0.1 mg/dL (ref 0.0–0.3)
TOTAL PROTEIN: 7.3 g/dL (ref 6.0–8.3)
Total Bilirubin: 0.4 mg/dL (ref 0.2–1.2)

## 2015-08-13 LAB — BASIC METABOLIC PANEL
BUN: 21 mg/dL (ref 6–23)
CO2: 29 mEq/L (ref 19–32)
Calcium: 9.2 mg/dL (ref 8.4–10.5)
Chloride: 106 mEq/L (ref 96–112)
Creatinine, Ser: 1.11 mg/dL (ref 0.40–1.50)
GFR: 89.42 mL/min (ref >60.00)
Glucose, Bld: 88 mg/dL (ref 70–99)
Potassium: 4.6 mEq/L (ref 3.5–5.1)
Sodium: 142 mEq/L (ref 135–145)

## 2015-08-13 LAB — TSH: TSH: 0.75 u[IU]/mL (ref 0.35–4.50)

## 2015-08-13 LAB — TROPONIN I: TNIDX: 0 ug/L (ref 0.00–0.06)

## 2015-08-13 NOTE — Patient Instructions (Addendum)
EKG is normal but there are minor ST-T changes of early repolarization. These are normal variants but could be mistaken for acute injury. This EKG should be available for comparison if  seen emergently.   Your next office appointment will be determined based upon review of your pending labs  and  xrays  Those written interpretation of the lab results and instructions will be transmitted to you by My Chart   Critical results will be called.   Followup as needed for any active or acute issue. Please report any significant change in your symptoms.  Please think about quitting smoking. Review the risks we discussed. Please call 1-800-QUIT-NOW [phone]) for free smoking cessation counseling.

## 2015-08-13 NOTE — Progress Notes (Signed)
Pre visit review using our clinic review tool, if applicable. No additional management support is needed unless otherwise documented below in the visit note. 

## 2015-08-13 NOTE — Progress Notes (Signed)
   Subjective:    Patient ID: [name], male    DOB: 1961/06/16, 54 y.o.   MRN: [id number]  HPI   He began having chest pain and pressure 6 weeks ago. It resolved without treatment or evaluation. It did recur in the last few days. It awoke him in the afternoon 9/11 and lasted 2 hours. Yesterday it recurred halfway to [place]. He drives a Armed forces technical officer.  The pain is described as squeezing and substernal without radiation, nausea, or diaphoresis.  He is a history of a PTCA possibly in 2010.  He smokes a pack a day. He eats red meat and fried foods. He exercises at a high level without symptoms  Review of systems is negative except for urinary frequency. He only has a bowel movement every 3-4 days despite taking MiraLAX.  He did have a 30 pound weight loss following his intestinal surgery in April this year. He was being evaluated for prostate disease and was found to have a "clot" in the small intestine in a benign mass in the large intestine. Surgical pathology was reviewed. He had a benign diverticulum of the colon and fibrous obliteration of the appendix.  Most recent labs were 03/26/15. Triglycerides 154;ALT 111; AST 65; glucose 111. He has no history of diabetes  Father had a heart attack most likely over 48. Maternal grandmother had strokes over 29.  Review of Systems  There is no significant cough, sputum production,hemoptysis, wheezing,or  paroxysmal nocturnal dyspnea. Ongoing weight loss, abdominal pain, significant dyspepsia, dysphagia, melena, rectal bleeding, or persistently small caliber stools are not present. Dysuria, pyuria, hematuria, frequency, nocturia or polyuria are denied.     Objective:   Physical Exam Pertinent or positive findings include: He has bilateral ear posts. He has an upper dental plate. Head is shaven. He has a Engineering geologist. The right thyroid lobe is greater than the left. He has a well-healed epigastric op scar. He has scattered tattoos. Homans sign is  negative bilaterally.  General appearance :adequately nourished; in no distress.  Eyes: No conjunctival inflammation or scleral icterus is present.  Oral exam:  Lips and gums are healthy appearing.There is no oropharyngeal erythema or exudate noted.   Heart:  Normal rate and regular rhythm. S1 and S2 normal without gallop, murmur, click, rub or other extra sounds    Lungs:Chest clear to auscultation; no wheezes, rhonchi,rales ,or rubs present.No increased work of breathing.   Abdomen: bowel sounds normal, soft and non-tender without masses, organomegaly or hernias noted.  No guarding or rebound.  Vascular : all pulses equal ; no bruits present.  Skin:Warm & dry.  Intact without suspicious lesions or rashes ; no tenting    Lymphatic: No lymphadenopathy is noted about the head, neck, axilla.   Neuro: Strength, tone & DTRs normal.     Assessment & Plan:  #1 chest pain in the context of known coronary artery disease. EKG is normal; there are minor insignificant early repolarization ST-T changes.  #2 dyslipidemia  #3 hyperglycemia.  #4 smoker;risk discussed  Plan: See orders recommendations  See orders

## 2015-08-14 LAB — D-DIMER, QUANTITATIVE: D-Dimer, Quant: 0.3 ug/mL-FEU (ref 0.00–0.48)

## 2015-08-26 ENCOUNTER — Telehealth: Payer: Self-pay | Admitting: Internal Medicine

## 2015-08-26 NOTE — Telephone Encounter (Signed)
New Message  This message is to inform you that we have made 3 consecutive attempts to contact your patient. We were unsuccessful in these attempts and wanted you to be aware of our efforts. Will remove the patient from our referral work queue at this time.  Thanks  Jeannetta Nap [facility]

## 2015-09-11 ENCOUNTER — Encounter: Payer: Self-pay | Admitting: *Deleted

## 2015-09-13 NOTE — Progress Notes (Signed)
HPI: 54 yo male for evaluation of chest pain. Cath 2006 showed normal LM, 40 LAD, normal LCX, and normal RCA; EF 60. Nuclear study 2011 showed inferior thinning and no ischemia; EF 53. Nuclear study 1/16 showed EF 52 showed inferior attenuation and no ischemia. Troponin and Ddimer negative 9/16. Hgb 13.8. Patient states that in September he had chest discomfort described as a tightness. It lasted 2 hours. No radiation. Not pleuritic, positional or exertional. Resolved spontaneously. No associated symptoms. He otherwise has not had any recurrent symptoms. He exercises vigorously lifting weights and riding a stationary bicycle. He does not have chest pain with these activities. He denies orthopnea, PND, pedal edema or syncope.  Current Outpatient Prescriptions  Medication Sig Dispense Refill  . aspirin EC 81 MG tablet Take 81 mg by mouth daily.    Marland Kitchen ibuprofen (ADVIL,MOTRIN) 200 MG tablet Take 600 mg by mouth every 6 (six) hours as needed for mild pain.    . Multiple Vitamins-Minerals (MULTIVITAMIN PO) Take 1 tablet by mouth daily.     No current facility-administered medications for this visit.    Allergies  Allergen Reactions  . Penicillins Hives and Itching     Past Medical History  Diagnosis Date  . Coronary atherosclerosis of unspecified type of vessel, native or graft   . Pure hypercholesterolemia   . Tobacco use disorder   . Obstructive sleep apnea (adult) (pediatric)   . Problems related to high-risk sexual behavior   . Dysmetabolic syndrome X   . Erectile dysfunction   . Chronic rhinitis   . Hypertrophy of prostate with urinary obstruction and other lower urinary tract symptoms (LUTS)   . Cancer ([place])   . Sickle cell anemia [facility])     Past Surgical History  Procedure Laterality Date  . Ptca  2010  . Incision and drainage abscess / hematoma of bursa / knee / thigh      I&D of complex abscess,left axilla. I&D of large infected pilonidal abscess  . Cosmetic surgery      . Hernia repair      UHR  AT [facility]  <5 YRS AGO   . Laparoscopic partial colectomy N/A 03/10/2015    Procedure: LAPAROSCOPIC ASSISTED RIGHT COLECTOMY;  Surgeon: Rolm Bookbinder, MD;  Location: [facility];  Service: General;  Laterality: N/A;    Social History   Social History  . Marital Status: Divorced    Spouse Name: N/A  . Number of Children: 1  . Years of Education: N/A   Occupational History  . Not on file.   Social History Main Topics  . Smoking status: Current Every Day Smoker -- 1.00 packs/day for 25 years  . Smokeless tobacco: Never Used  . Alcohol Use: 0.0 oz/week    0 Standard drinks or equivalent per week     Comment: Occasional  . Drug Use: No  . Sexual Activity: Yes    Birth Control/ Protection: Condom   Other Topics Concern  . Not on file   Social History Narrative    Family History  Problem Relation Age of Onset  . Breast cancer Mother   . Colon cancer Maternal Grandmother   . Colon cancer Maternal Grandfather   . Colon cancer Other     1st degree relative<60  . Heart attack Father     Over 19  . Esophageal cancer Neg Hx   . Stomach cancer Neg Hx   . Rectal cancer Neg Hx   . Stroke Maternal Grandmother  Over 60  . Prostate cancer Maternal Grandfather     ROS: no fevers or chills, productive cough, hemoptysis, dysphasia, odynophagia, melena, hematochezia, dysuria, hematuria, rash, seizure activity, orthopnea, PND, pedal edema, claudication. Remaining systems are negative.  Physical Exam:   Blood pressure 134/82, pulse 66, height 6' (1.829 m), weight 101.969 kg (224 lb 12.8 oz).  General:  Well developed/well nourished in NAD Skin warm/dry, Tattoos Patient not depressed No peripheral clubbing Back-normal HEENT-normal/normal eyelids Neck supple/normal carotid upstroke bilaterally; no bruits; no JVD; no thyromegaly chest - CTA/ normal expansion CV - RRR/normal S1 and S2; no murmurs, rubs or gallops;  PMI nondisplaced Abdomen -NT/ND, no  HSM, no mass, + bowel sounds, no bruit, Status post abdominal surgery 2+ femoral pulses, no bruits Ext-no edema, chords, 2+ DP Neuro-grossly nonfocal  ECG 08/13/15-NSR, no ST changes

## 2015-09-14 ENCOUNTER — Ambulatory Visit (INDEPENDENT_AMBULATORY_CARE_PROVIDER_SITE_OTHER): Payer: BLUE CROSS/BLUE SHIELD | Admitting: Cardiology

## 2015-09-14 ENCOUNTER — Encounter: Payer: Self-pay | Admitting: Cardiology

## 2015-09-14 VITALS — BP 134/82 | HR 66 | Ht 72.0 in | Wt 224.8 lb

## 2015-09-14 DIAGNOSIS — F17200 Nicotine dependence, unspecified, uncomplicated: Secondary | ICD-10-CM

## 2015-09-14 DIAGNOSIS — R072 Precordial pain: Secondary | ICD-10-CM

## 2015-09-14 NOTE — Patient Instructions (Signed)
Your physician wants you to follow-up in: ONE YEAR WITH DR CRENSHAW You will receive a reminder letter in the mail two months in advance. If you don't receive a letter, please call our office to schedule the follow-up appointment.  

## 2015-09-14 NOTE — Assessment & Plan Note (Signed)
Patient counseled on discontinuing. 

## 2015-09-14 NOTE — Assessment & Plan Note (Signed)
Patient had chest pain back in September. His symptoms were atypical and his electrocardiogram showed no ST changes. D-dimer and troponin normal. He had a negative nuclear study in January. He exercises vigorously with no chest pain. I do not think further ischemia evaluation is indicated at this point. We discussed risk factor modification. I will see him back in one year.

## 2015-10-12 ENCOUNTER — Encounter (HOSPITAL_BASED_OUTPATIENT_CLINIC_OR_DEPARTMENT_OTHER): Payer: Self-pay | Admitting: *Deleted

## 2015-10-19 ENCOUNTER — Ambulatory Visit (HOSPITAL_BASED_OUTPATIENT_CLINIC_OR_DEPARTMENT_OTHER): Payer: BLUE CROSS/BLUE SHIELD | Admitting: Anesthesiology

## 2015-10-19 ENCOUNTER — Encounter (HOSPITAL_BASED_OUTPATIENT_CLINIC_OR_DEPARTMENT_OTHER): Payer: Self-pay | Admitting: *Deleted

## 2015-10-19 ENCOUNTER — Ambulatory Visit (HOSPITAL_BASED_OUTPATIENT_CLINIC_OR_DEPARTMENT_OTHER)
Admission: RE | Admit: 2015-10-19 | Discharge: 2015-10-19 | Disposition: A | Discharge status: Home / Self Care | Payer: BLUE CROSS/BLUE SHIELD | Source: Ambulatory Visit | Attending: General Surgery | Admitting: General Surgery

## 2015-10-19 ENCOUNTER — Encounter (HOSPITAL_BASED_OUTPATIENT_CLINIC_OR_DEPARTMENT_OTHER)
Admission: RE | Disposition: A | Discharge status: Home / Self Care | Payer: Self-pay | Source: Ambulatory Visit | Attending: General Surgery

## 2015-10-19 DIAGNOSIS — Z7982 Long term (current) use of aspirin: Secondary | ICD-10-CM | POA: Insufficient documentation

## 2015-10-19 DIAGNOSIS — L720 Epidermal cyst: Secondary | ICD-10-CM | POA: Insufficient documentation

## 2015-10-19 DIAGNOSIS — Z791 Long term (current) use of non-steroidal anti-inflammatories (NSAID): Secondary | ICD-10-CM | POA: Diagnosis not present

## 2015-10-19 DIAGNOSIS — I2510 Atherosclerotic heart disease of native coronary artery without angina pectoris: Secondary | ICD-10-CM | POA: Diagnosis not present

## 2015-10-19 DIAGNOSIS — F17200 Nicotine dependence, unspecified, uncomplicated: Secondary | ICD-10-CM | POA: Diagnosis not present

## 2015-10-19 DIAGNOSIS — D571 Sickle-cell disease without crisis: Secondary | ICD-10-CM | POA: Insufficient documentation

## 2015-10-19 DIAGNOSIS — G4733 Obstructive sleep apnea (adult) (pediatric): Secondary | ICD-10-CM | POA: Diagnosis not present

## 2015-10-19 DIAGNOSIS — E7800 Pure hypercholesterolemia, unspecified: Secondary | ICD-10-CM | POA: Diagnosis not present

## 2015-10-19 DIAGNOSIS — R222 Localized swelling, mass and lump, trunk: Secondary | ICD-10-CM | POA: Diagnosis present

## 2015-10-19 HISTORY — PX: MASS EXCISION: SHX2000

## 2015-10-19 SURGERY — EXCISION MASS
Anesthesia: General | Site: Back

## 2015-10-19 MED ORDER — PHENYLEPHRINE 40 MCG/ML (10ML) SYRINGE FOR IV PUSH (FOR BLOOD PRESSURE SUPPORT)
PREFILLED_SYRINGE | INTRAVENOUS | Status: AC
  Filled 2015-10-19: qty 10

## 2015-10-19 MED ORDER — ONDANSETRON HCL 4 MG/2ML IJ SOLN
INTRAMUSCULAR | Status: AC
  Filled 2015-10-19: qty 2

## 2015-10-19 MED ORDER — BUPIVACAINE HCL (PF) 0.25 % IJ SOLN
INTRAMUSCULAR | Status: DC | PRN
  Administered 2015-10-19: 8 mL

## 2015-10-19 MED ORDER — ATROPINE SULFATE 0.4 MG/ML IJ SOLN
INTRAMUSCULAR | Status: AC
  Filled 2015-10-19: qty 1

## 2015-10-19 MED ORDER — BUPIVACAINE HCL (PF) 0.25 % IJ SOLN
INTRAMUSCULAR | Status: AC
  Filled 2015-10-19: qty 30

## 2015-10-19 MED ORDER — MIDAZOLAM HCL 2 MG/2ML IJ SOLN
INTRAMUSCULAR | Status: AC
  Filled 2015-10-19: qty 2

## 2015-10-19 MED ORDER — LIDOCAINE HCL (PF) 1 % IJ SOLN
INTRAMUSCULAR | Status: AC
  Filled 2015-10-19: qty 30

## 2015-10-19 MED ORDER — ONDANSETRON HCL 4 MG/2ML IJ SOLN
INTRAMUSCULAR | Status: DC | PRN
  Administered 2015-10-19: 4 mg via INTRAVENOUS

## 2015-10-19 MED ORDER — DEXAMETHASONE SODIUM PHOSPHATE 4 MG/ML IJ SOLN
INTRAMUSCULAR | Status: DC | PRN
Start: 2015-10-19 — End: 2015-10-19
  Administered 2015-10-19: 10 mg via INTRAVENOUS

## 2015-10-19 MED ORDER — GLYCOPYRROLATE 0.2 MG/ML IJ SOLN: 0.2000 mg | Freq: Once | INTRAMUSCULAR | Status: DC | PRN

## 2015-10-19 MED ORDER — SCOPOLAMINE 1 MG/3DAYS TD PT72: 1.0000 | MEDICATED_PATCH | Freq: Once | TRANSDERMAL | Status: DC | PRN

## 2015-10-19 MED ORDER — FENTANYL CITRATE (PF) 100 MCG/2ML IJ SOLN
50.0000 ug | INTRAMUSCULAR | Status: DC | PRN
  Administered 2015-10-19: 100 ug via INTRAVENOUS

## 2015-10-19 MED ORDER — PROPOFOL 10 MG/ML IV BOLUS
INTRAVENOUS | Status: DC | PRN
  Administered 2015-10-19: 300 mg via INTRAVENOUS

## 2015-10-19 MED ORDER — CIPROFLOXACIN IN D5W 400 MG/200ML IV SOLN
INTRAVENOUS | Status: AC
  Filled 2015-10-19: qty 200

## 2015-10-19 MED ORDER — HYDROMORPHONE HCL 1 MG/ML IJ SOLN
0.2500 mg | INTRAMUSCULAR | Status: DC | PRN
  Administered 2015-10-19 (×4): 0.5 mg via INTRAVENOUS

## 2015-10-19 MED ORDER — CIPROFLOXACIN IN D5W 400 MG/200ML IV SOLN
400.0000 mg | Freq: Once | INTRAVENOUS | Status: AC
  Administered 2015-10-19: 400 mg via INTRAVENOUS

## 2015-10-19 MED ORDER — PROMETHAZINE HCL 25 MG/ML IJ SOLN: 6.2500 mg | INTRAMUSCULAR | Status: DC | PRN

## 2015-10-19 MED ORDER — BACITRACIN ZINC 500 UNIT/GM EX OINT
TOPICAL_OINTMENT | CUTANEOUS | Status: AC
  Filled 2015-10-19: qty 28.35

## 2015-10-19 MED ORDER — LACTATED RINGERS IV SOLN
INTRAVENOUS | Status: DC
  Administered 2015-10-19: 07:00:00 via INTRAVENOUS

## 2015-10-19 MED ORDER — DEXAMETHASONE SODIUM PHOSPHATE 10 MG/ML IJ SOLN
INTRAMUSCULAR | Status: AC
  Filled 2015-10-19: qty 1

## 2015-10-19 MED ORDER — HYDROMORPHONE HCL 1 MG/ML IJ SOLN
INTRAMUSCULAR | Status: AC
  Filled 2015-10-19: qty 1

## 2015-10-19 MED ORDER — SUCCINYLCHOLINE CHLORIDE 20 MG/ML IJ SOLN
INTRAMUSCULAR | Status: AC
  Filled 2015-10-19: qty 1

## 2015-10-19 MED ORDER — MIDAZOLAM HCL 2 MG/2ML IJ SOLN
1.0000 mg | INTRAMUSCULAR | Status: DC | PRN
  Administered 2015-10-19: 2 mg via INTRAVENOUS

## 2015-10-19 MED ORDER — SUCCINYLCHOLINE CHLORIDE 20 MG/ML IJ SOLN
INTRAMUSCULAR | Status: DC | PRN
  Administered 2015-10-19: 140 mg via INTRAVENOUS

## 2015-10-19 MED ORDER — FENTANYL CITRATE (PF) 100 MCG/2ML IJ SOLN
INTRAMUSCULAR | Status: AC
  Filled 2015-10-19: qty 2

## 2015-10-19 MED ORDER — LIDOCAINE HCL (CARDIAC) 20 MG/ML IV SOLN
INTRAVENOUS | Status: AC
  Filled 2015-10-19: qty 5

## 2015-10-19 SURGICAL SUPPLY — 47 items
BLADE CLIPPER SURG (BLADE) IMPLANT
BLADE SURG 15 STRL LF DISP TIS (BLADE) ×1 IMPLANT
BLADE SURG 15 STRL SS (BLADE) ×2
CANISTER SUCT 1200ML W/VALVE (MISCELLANEOUS) IMPLANT
CHLORAPREP W/TINT 26ML (MISCELLANEOUS) ×2 IMPLANT
CLSR STERI-STRIP ANTIMIC 1/2X4 (GAUZE/BANDAGES/DRESSINGS) ×2 IMPLANT
COVER BACK TABLE 60X90IN (DRAPES) ×2 IMPLANT
COVER MAYO STAND STRL (DRAPES) ×2 IMPLANT
DECANTER SPIKE VIAL GLASS SM (MISCELLANEOUS) IMPLANT
DRAPE LAPAROTOMY 100X72 PEDS (DRAPES) ×2 IMPLANT
DRSG TEGADERM 4X4.75 (GAUZE/BANDAGES/DRESSINGS) IMPLANT
ELECT COATED BLADE 2.86 ST (ELECTRODE) IMPLANT
ELECT REM PT RETURN 9FT ADLT (ELECTROSURGICAL) ×2
ELECTRODE REM PT RTRN 9FT ADLT (ELECTROSURGICAL) ×1 IMPLANT
GAUZE PACKING IODOFORM 1/4X15 (GAUZE/BANDAGES/DRESSINGS) IMPLANT
GAUZE SPONGE 4X4 12PLY STRL (GAUZE/BANDAGES/DRESSINGS) ×1 IMPLANT
GLOVE BIO SURGEON STRL SZ7 (GLOVE) ×2 IMPLANT
GLOVE BIOGEL PI IND STRL 7.5 (GLOVE) ×1 IMPLANT
GLOVE BIOGEL PI INDICATOR 7.5 (GLOVE) ×2
GLOVE SS BIOGEL STRL SZ 7.5 (GLOVE) IMPLANT
GLOVE SUPERSENSE BIOGEL SZ 7.5 (GLOVE) ×1
GOWN STRL REUS W/ TWL LRG LVL3 (GOWN DISPOSABLE) ×3 IMPLANT
GOWN STRL REUS W/TWL LRG LVL3 (GOWN DISPOSABLE) ×6
LIQUID BAND (GAUZE/BANDAGES/DRESSINGS) ×2 IMPLANT
MARKER SKIN DUAL TIP RULER LAB (MISCELLANEOUS) ×2 IMPLANT
NDL HYPO 25X1 1.5 SAFETY (NEEDLE) ×1 IMPLANT
NEEDLE HYPO 25X1 1.5 SAFETY (NEEDLE) ×2 IMPLANT
NS IRRIG 1000ML POUR BTL (IV SOLUTION) IMPLANT
PACK BASIN DAY SURGERY FS (CUSTOM PROCEDURE TRAY) ×2 IMPLANT
PENCIL BUTTON HOLSTER BLD 10FT (ELECTRODE) ×2 IMPLANT
SLEEVE SCD COMPRESS KNEE MED (MISCELLANEOUS) ×2 IMPLANT
SPONGE GAUZE 4X4 12PLY STER LF (GAUZE/BANDAGES/DRESSINGS) IMPLANT
SUT ETHILON 2 0 FS 18 (SUTURE) ×2 IMPLANT
SUT MNCRL AB 4-0 PS2 18 (SUTURE) ×2 IMPLANT
SUT SILK 2 0 SH (SUTURE) IMPLANT
SUT VIC AB 2-0 SH 27 (SUTURE)
SUT VIC AB 2-0 SH 27XBRD (SUTURE) IMPLANT
SUT VICRYL 3-0 CR8 SH (SUTURE) IMPLANT
SUT VICRYL 4-0 PS2 18IN ABS (SUTURE) IMPLANT
SWAB COLLECTION DEVICE MRSA (MISCELLANEOUS) IMPLANT
SWAB CULTURE ESWAB REG 1ML (MISCELLANEOUS) IMPLANT
SYR CONTROL 10ML LL (SYRINGE) ×2 IMPLANT
TOWEL OR 17X24 6PK STRL BLUE (TOWEL DISPOSABLE) ×2 IMPLANT
TOWEL OR NON WOVEN STRL DISP B (DISPOSABLE) ×2 IMPLANT
TUBE CONNECTING 20X1/4 (TUBING) IMPLANT
UNDERPAD 30X30 (UNDERPADS AND DIAPERS) IMPLANT
YANKAUER SUCT BULB TIP NO VENT (SUCTIONS) IMPLANT

## 2015-10-19 NOTE — Discharge Instructions (Signed)
[facility]  POST OP INSTRUCTIONS  Always review your discharge instruction sheet given to you by the facility where your surgery was performed. IF YOU HAVE DISABILITY OR FAMILY LEAVE FORMS, YOU MUST BRING THEM TO THE OFFICE FOR PROCESSING.   DO NOT GIVE THEM TO YOUR DOCTOR.  1. A prescription for pain medication may be given to you upon discharge.  Take your pain medication as prescribed, if needed.  If narcotic pain medicine is not needed, then you may take acetaminophen (Tylenol), naprosyn (Alleve), or ibuprofen (Advil) as needed. 2. Take your usually prescribed medications unless otherwise directed. 3. If you need a refill on your pain medication, please contact your pharmacy.  They will contact our office to request authorization. Prescriptions will not be filled after 5pm or on week-ends. 4. You should follow a light diet the first few days after arrival home, such as soup and crackers, etc.  Be sure to include lots of fluids daily. 5. Most patients will experience some swelling and bruising in the area of the incisions.  Ice packs will help.  Swelling and bruising can take several days to resolve.  6. It is common to experience some constipation if taking pain medication after surgery.  Increasing fluid intake and taking a stool softener (such as Colace) will usually help or prevent this problem from occurring.  A mild laxative (Milk of Magnesia or Miralax) should be taken according to package instructions if there are no bowel movements after 48 hours. 7. Unless discharge instructions indicate otherwise, you may remove your bandages 48 hours after surgery, and you may shower at that time.  You may have steri-strips (small skin tapes) in place directly over the incision.  These strips should be left on the skin for 7-10 days.  If your surgeon used skin glue on the incision, you may shower in 24 hours.  The glue will flake off over the next  2-3 weeks.  Any sutures or staples will be removed at the office during your follow-up visit. 8. ACTIVITIES:  You may resume regular (light) daily activities beginning the next day--such as daily self-care, walking, climbing stairs--gradually increasing activities as tolerated.  You may have sexual intercourse when it is comfortable.  Refrain from any heavy lifting or straining until approved by your doctor. a. You may drive when you are no longer taking prescription pain medication, you can comfortably wear a seatbelt, and you can safely maneuver your car and apply brakes. b. RETURN TO WORK:  __________________________________________________________ 9. You should see your doctor in the office for a follow-up appointment approximately 2-3 weeks after your surgery.  Make sure that you call for this appointment within a day or two after you arrive home to insure a convenient appointment time. 10. OTHER INSTRUCTIONS: __________________________________________________________________________________________________________________________ __________________________________________________________________________________________________________________________ WHEN TO CALL YOUR DOCTOR: 1. Fever over 101.0 2. Inability to urinate 3. Continued bleeding from incision. 4. Increased pain, redness, or drainage from the incision. 5. Increasing abdominal pain  The clinic staff is available to answer your questions during regular business hours.  Please dont hesitate to call and ask to speak to one of the nurses for clinical concerns.  If you have a medical emergency, go to the nearest emergency room or call 911.  A surgeon from [facility] is always on call at the hospital. [address] [place], [place], [place]  [address] ? [address], [place], [place]   [address] [phone] ? [phone] ? FAX [phone]  [email or web address] ° °Post Anesthesia Home Care Instructions ° °Activity: °Get  plenty of rest for the remainder of the day. A responsible adult should stay with you for 24 hours following the procedure.  °For the next 24 hours, DO NOT: °-Drive a car °-Operate machinery °-Drink alcoholic beverages °-Take any medication unless instructed by your physician °-Make any legal decisions or sign important papers. ° °Meals: °Start with liquid foods such as gelatin or soup. Progress to regular foods as tolerated. Avoid greasy, spicy, heavy foods. If nausea and/or vomiting occur, drink only clear liquids until the nausea and/or vomiting subsides. Call your physician if vomiting continues. ° °Special Instructions/Symptoms: °Your throat may feel dry or sore from the anesthesia or the breathing tube placed in your throat during surgery. If this causes discomfort, gargle with warm salt water. The discomfort should disappear within 24 hours. ° °If you had a scopolamine patch placed behind your ear for the management of post- operative nausea and/or vomiting: ° °1. The medication in the patch is effective for 72 hours, after which it should be removed.  Wrap patch in a tissue and discard in the trash. Wash hands thoroughly with soap and water. °2. You may remove the patch earlier than 72 hours if you experience unpleasant side effects which may include dry mouth, dizziness or visual disturbances. °3. Avoid touching the patch. Wash your hands with soap and water after contact with the patch. °  ° °

## 2015-10-19 NOTE — Interval H&P Note (Signed)
History and Physical Interval Note:  10/19/2015 7:42 AM  [name]  has presented today for surgery, with the diagnosis of back mass  The various methods of treatment have been discussed with the patient and family. After consideration of risks, benefits and other options for treatment, the patient has consented to  Procedure(s): EXCISION BACK MASS (N/A) as a surgical intervention .  The patient's history has been reviewed, patient examined, no change in status, stable for surgery.  I have reviewed the patient's chart and labs.  Questions were answered to the patient's satisfaction.     [name]

## 2015-10-19 NOTE — Anesthesia Postprocedure Evaluation (Addendum)
Anesthesia Post Note  Patient: [name]  Procedure(s) Performed: Procedure(s) (LRB): EXCISION BACK MASS (N/A)  Patient location during evaluation: PACU Anesthesia Type: General Level of consciousness: awake and alert Pain management: pain level controlled Vital Signs Assessment: post-procedure vital signs reviewed and stable Respiratory status: spontaneous breathing and respiratory function stable Cardiovascular status: stable Anesthetic complications: no    Last Vitals:  Filed Vitals:   10/19/15 0830 10/19/15 0845  BP: 137/93 127/83  Pulse: 98 95  Temp: 36.5 C   Resp: 20 23    Last Pain:  Filed Vitals:   10/19/15 0901  PainSc: [place]

## 2015-10-19 NOTE — Anesthesia Procedure Notes (Signed)
Procedure Name: Intubation Date/Time: 10/19/2015 7:46 AM Performed by: Melynda Ripple D Pre-anesthesia Checklist: Patient identified, Emergency Drugs available, Suction available and Patient being monitored Patient Re-evaluated:Patient Re-evaluated prior to inductionOxygen Delivery Method: Circle System Utilized Preoxygenation: Pre-oxygenation with 100% oxygen Intubation Type: IV induction Ventilation: Mask ventilation without difficulty Grade View: Grade I Tube type: Oral Tube size: 7.0 mm Number of attempts: 1 Airway Equipment and Method: Stylet and Oral airway Placement Confirmation: ETT inserted through vocal cords under direct vision,  positive ETCO2 and breath sounds checked- equal and bilateral Secured at: 24 cm Tube secured with: Tape Dental Injury: Teeth and Oropharynx as per pre-operative assessment

## 2015-10-19 NOTE — Op Note (Signed)
Preoperative diagnosis: back mass Postoperative diagnosis: same as above Procedure: excision of 2x3 cm subq back mass Surgeon: Dr Serita Grammes Anesthesia: general EBL: minimal Drains none Specimen back mass to pathology Complications: none Sponge count correct at completion Disposition to recovery stable  Indications: This is a 63 yom with a symptomatic back mass who desires excision  Procedure: After informed consent was obtained the patient was taken to the operating room. He was  given antibiotics. Sequential compression devices were on his legs.He was placed under general anesthesia per his choice without complication. He was placed in right lateral position and appropriately padded. . A surgical timeout was then performed.   I infiltrated marcaine around the mass. I then made elliptical incision and removed the mass in its entirety. It was not entered.  Hemostasis was observed. I closed with 3-0 vicryl and 2-0 nylon.  Bacitracin and sterile dressing were placed.  He tolerated well, was extubated and transferred to recovery stable.

## 2015-10-19 NOTE — Transfer of Care (Signed)
Immediate Anesthesia Transfer of Care Note  Patient: [name]  Procedure(s) Performed: Procedure(s): EXCISION BACK MASS (N/A)  Patient Location: PACU  Anesthesia Type:General  Level of Consciousness: sedated  Airway & Oxygen Therapy: Patient Spontanous Breathing and Patient connected to face mask oxygen  Post-op Assessment: Report given to RN and Post -op Vital signs reviewed and stable  Post vital signs: Reviewed and stable  Last Vitals:  Filed Vitals:   10/19/15 0655 10/19/15 0830  BP: 131/96   Pulse: 68 98  Temp: 36.7 C   Resp: 20     Complications: No apparent anesthesia complications

## 2015-10-19 NOTE — Anesthesia Preprocedure Evaluation (Addendum)
Anesthesia Evaluation  Patient identified by MRN, date of birth, ID band Patient awake    Reviewed: Allergy & Precautions, NPO status , Patient's Chart, lab work & pertinent test results  History of Anesthesia Complications Negative for: history of anesthetic complications  Airway Mallampati: II  TM Distance: >3 FB Neck ROM: Full    Dental  (+) Edentulous Upper, Dental Advisory Given   Pulmonary sleep apnea , Current Smoker,    Pulmonary exam normal        Cardiovascular + CAD  Normal cardiovascular exam     Neuro/Psych negative neurological ROS  negative psych ROS   GI/Hepatic negative GI ROS, Neg liver ROS,   Endo/Other  negative endocrine ROS  Renal/GU negative Renal ROS     Musculoskeletal   Abdominal   Peds  Hematology   Anesthesia Other Findings   Reproductive/Obstetrics                            Anesthesia Physical Anesthesia Plan  ASA: III  Anesthesia Plan: General   Post-op Pain Management:    Induction: Intravenous  Airway Management Planned:   Additional Equipment:   Intra-op Plan:   Post-operative Plan: Extubation in OR  Informed Consent: I have reviewed the patients History and Physical, chart, labs and discussed the procedure including the risks, benefits and alternatives for the proposed anesthesia with the patient or authorized representative who has indicated his/her understanding and acceptance.   Dental advisory given  Plan Discussed with: CRNA, Anesthesiologist and Surgeon  Anesthesia Plan Comments:        Anesthesia Quick Evaluation

## 2015-10-19 NOTE — H&P (Signed)
[name] is an 54 y.o. male.   Chief Complaint: back mass HPI:  83 yom I know well from colectomy who has known back mass c/w sebaceous cyst. this has not been infected but is increasing in size and is bothering him now. otherwise no complaints.   Past Medical History  Diagnosis Date  . Coronary atherosclerosis of unspecified type of vessel, native or graft   . Pure hypercholesterolemia   . Tobacco use disorder   . Problems related to high-risk sexual behavior   . Dysmetabolic syndrome X   . Erectile dysfunction   . Chronic rhinitis   . Hypertrophy of prostate with urinary obstruction and other lower urinary tract symptoms (LUTS)   . Cancer ([place])   . Sickle cell anemia (HCC)   . Obstructive sleep apnea (adult) (pediatric)     no CPAP, has lost weight    Past Surgical History  Procedure Laterality Date  . Ptca  2010  . Incision and drainage abscess / hematoma of bursa / knee / thigh      I&D of complex abscess,left axilla. I&D of large infected pilonidal abscess  . Cosmetic surgery    . Hernia repair      UHR  AT [facility]  <5 YRS AGO   . Laparoscopic partial colectomy N/A 03/10/2015    Procedure: LAPAROSCOPIC ASSISTED RIGHT COLECTOMY;  Surgeon: Rolm Bookbinder, MD;  Location: [facility] OR;  Service: General;  Laterality: N/A;    Family History  Problem Relation Age of Onset  . Breast cancer Mother   . Colon cancer Maternal Grandmother   . Colon cancer Maternal Grandfather   . Colon cancer Other     1st degree relative<60  . Heart attack Father     Over 30  . Esophageal cancer Neg Hx   . Stomach cancer Neg Hx   . Rectal cancer Neg Hx   . Stroke Maternal Grandmother     Over 73  . Prostate cancer Maternal Grandfather    Social History:  reports that he has been smoking.  He has never used smokeless tobacco. He reports that he drinks alcohol. He reports that he does not use illicit drugs.  Allergies:  Allergies  Allergen Reactions  . Penicillins Hives and Itching     Medications Prior to Admission  Medication Sig Dispense Refill  . aspirin EC 81 MG tablet Take 81 mg by mouth daily.    Marland Kitchen ibuprofen (ADVIL,MOTRIN) 200 MG tablet Take 600 mg by mouth every 6 (six) hours as needed for mild pain.    . Multiple Vitamins-Minerals (MULTIVITAMIN PO) Take 1 tablet by mouth daily.      No results found for this or any previous visit (from the past 48 hour(s)). No results found.  ROS negative Blood pressure 131/96, pulse 68, temperature 98.1 F (36.7 C), temperature source Oral, resp. rate 20, height 6' (1.829 m), weight 102.967 kg (227 lb), SpO2 100 %. Physical Exam  Vitals (Alisha Spillers CMA; 09/25/2015 10:42 AM) 09/25/2015 10:41 AM Weight: 228 lb Height: 72in Body Surface Area: 2.25 m Body Mass Index: 30.92 kg/m  Pulse: 68 (Regular)  BP: 142/76 (Sitting, Left Arm)  Physical Exam Rolm Bookbinder MD; 09/25/2015 10:52 AM) Musculoskeletal Note: 2x 3 cm mid back eic without infection, subq cv rrr pulm clear  Assessment/Plan Assessment & Plan Rolm Bookbinder MD; 09/25/2015 10:53 AM) MASS ON BACK (R22.2) Story: excision back mass discussed observation but due to symptoms and appearance he would like excised. he wants anesthesia  although I think we could do in office. will plan for excision at surgery center, will leave stitches in 2 weeks and no lifting more than 20 pounds for 2 weeks  [name] 10/19/2015, 7:41 AM

## 2015-10-20 ENCOUNTER — Encounter (HOSPITAL_BASED_OUTPATIENT_CLINIC_OR_DEPARTMENT_OTHER): Payer: Self-pay | Admitting: General Surgery

## 2016-01-27 ENCOUNTER — Encounter: Payer: Self-pay | Admitting: Internal Medicine

## 2016-02-19 ENCOUNTER — Ambulatory Visit (INDEPENDENT_AMBULATORY_CARE_PROVIDER_SITE_OTHER): Payer: BLUE CROSS/BLUE SHIELD | Admitting: Family Medicine

## 2016-02-19 ENCOUNTER — Encounter: Payer: Self-pay | Admitting: Family Medicine

## 2016-02-19 VITALS — BP 120/82 | HR 80 | Temp 98.6°F | Ht 72.0 in | Wt 231.3 lb

## 2016-02-19 DIAGNOSIS — R0789 Other chest pain: Secondary | ICD-10-CM

## 2016-02-19 DIAGNOSIS — F17200 Nicotine dependence, unspecified, uncomplicated: Secondary | ICD-10-CM

## 2016-02-19 MED ORDER — BUPROPION HCL ER (SR) 150 MG PO TB12: ORAL_TABLET | ORAL | Status: DC

## 2016-02-19 NOTE — Progress Notes (Signed)
Pre visit review using our clinic review tool, if applicable. No additional management support is needed unless otherwise documented below in the visit note. 

## 2016-02-19 NOTE — Patient Instructions (Addendum)
Chest pain is muscular in nature.  Ibuprofen as needed.  Lay off the chest exercise until you are feeling 100%.  Follow up closely with your PCP.  Take care  Dr. Lacinda Axon

## 2016-02-21 DIAGNOSIS — R0789 Other chest pain: Secondary | ICD-10-CM | POA: Insufficient documentation

## 2016-02-21 NOTE — Assessment & Plan Note (Signed)
Discussed treatment options today. Patient okay with starting Wellbutrin. Rx sent today.

## 2016-02-21 NOTE — Assessment & Plan Note (Signed)
New problem. MSK in nature secondary to injury from recent intense workout. Advised PRN Ibuprofen and rest.

## 2016-02-21 NOTE — Progress Notes (Signed)
Subjective:  Patient ID: [name], male    DOB: 10/30/1961  Age: 55 y.o. MRN: [id number]  CC: R sided chest pain  HPI:  55 year old male with CAD, HLD presents with the above complaints. He would also like to discuss his tobacco use today.  Chest pain  Started 2-3 days ago.  Right sided.   Tender to palpation; worse with movement.  Likely from recent physical activity (recently went to gym and did an intense chest workout).  No associated SOB.  No known relieving factors.  No other complaints today.  Tobacco abuse  I inquired about patient's smoking status today and he is interested in quitting.  Will discuss treatment options today.  Social Hx   Social History   Social History  . Marital Status: Divorced    Spouse Name: N/A  . Number of Children: 1  . Years of Education: N/A   Social History Main Topics  . Smoking status: Current Every Day Smoker -- 1.00 packs/day for 25 years  . Smokeless tobacco: Never Used  . Alcohol Use: 0.0 oz/week    0 Standard drinks or equivalent per week     Comment: Occasional  . Drug Use: No  . Sexual Activity: Yes    Birth Control/ Protection: Condom   Other Topics Concern  . None   Social History Narrative   Review of Systems  Respiratory: Negative for shortness of breath.   Cardiovascular: Positive for chest pain.   Objective:  BP 120/82 mmHg  Pulse 80  Temp(Src) 98.6 F (37 C) (Oral)  Ht 6' (1.829 m)  Wt 231 lb 5 oz (104.923 kg)  BMI 31.36 kg/m2  SpO2 97%  BP/Weight 02/19/2016 10/19/2015 [id number]  Systolic BP 123456 123456 [id number]  Diastolic BP 82 88 82  Wt. (Lbs) 231.31 227 224.8  BMI 31.36 30.78 30.48    Physical Exam  Constitutional: He appears well-developed. No distress.  Cardiovascular: Normal rate and regular rhythm.   No murmur heard. Pulmonary/Chest: Effort normal. No respiratory distress. He has no wheezes. He has no rales.  Right chest tender to palpation.  Neurological: He is alert.  Vitals  reviewed.   Lab Results  Component Value Date   WBC 4.8 08/13/2015   HGB 13.8 08/13/2015   HCT 41.4 08/13/2015   PLT 226.0 08/13/2015   GLUCOSE 88 08/13/2015   CHOL 152 08/13/2015   TRIG 66.0 08/13/2015   HDL 54.50 08/13/2015   LDLDIRECT 132.4 07/29/2008   LDLCALC 84 08/13/2015   ALT 26 08/13/2015   AST 16 08/13/2015   NA 142 08/13/2015   K 4.6 08/13/2015   CL 106 08/13/2015   CREATININE 1.11 08/13/2015   BUN 21 08/13/2015   CO2 29 08/13/2015   TSH 0.75 08/13/2015   PSA 2.98 11/10/2014   HGBA1C 5.8 08/13/2015   MICROALBUR 1.2 03/22/2012    Assessment & Plan:   Problem List Items Addressed This Visit    TOBACCO ABUSE    Discussed treatment options today. Patient okay with starting Wellbutrin. Rx sent today.      Right-sided chest wall pain - Primary    New problem. MSK in nature secondary to injury from recent intense workout. Advised PRN Ibuprofen and rest.         Meds ordered this encounter  Medications  . buPROPion (WELLBUTRIN SR) 150 MG 12 hr tablet    Sig: Once daily for 3 days, then increase to twice daily.  Pick a quit day approximately 1-2  weeks after starting.    Dispense:  90 tablet    Refill:  0    Follow-up: PRN  [place]

## 2016-05-29 IMAGING — CR DG ABDOMEN 1V
1 series · 1 of 1 positions shown · non-contrast
Comparison: Abdominal CT 03/17/2015

CLINICAL DATA: Nasogastric tube.  Ileus.

EXAM:
ABDOMEN - 1 VIEW

[abdomen supine]
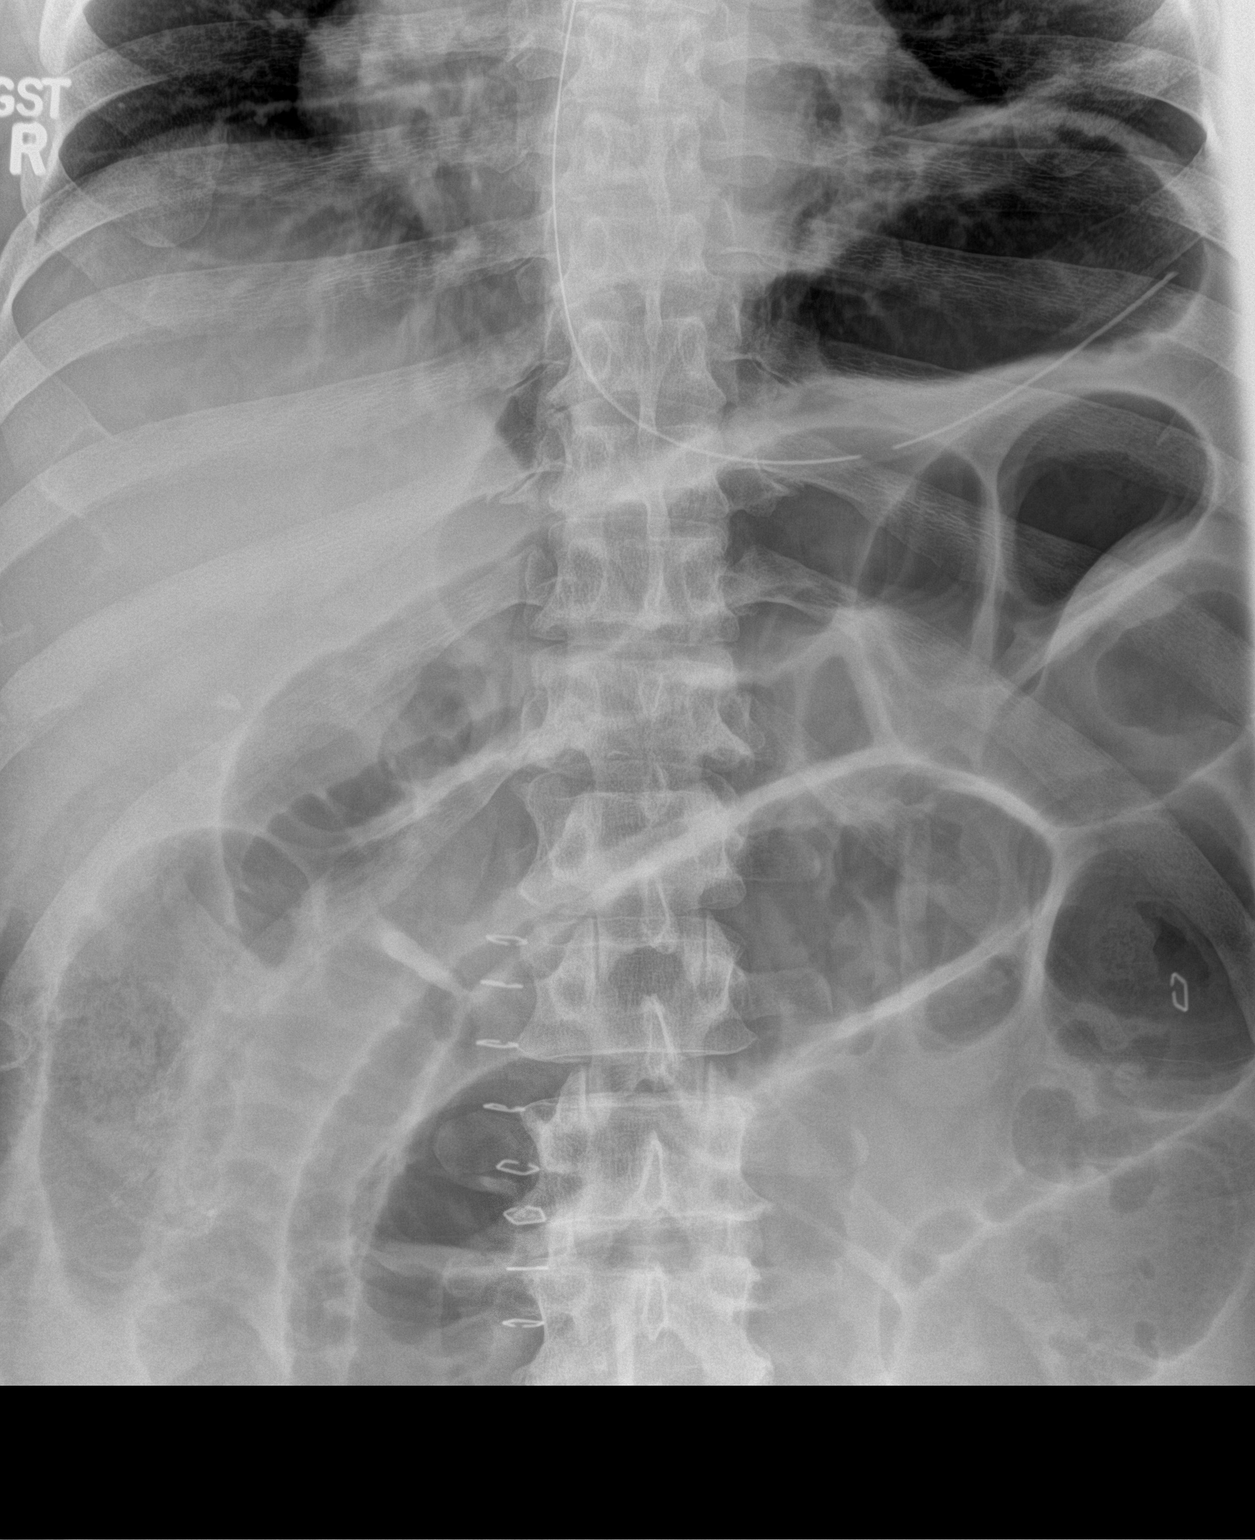

[1 of 1 positions shown; findings below may reference images not displayed]

FINDINGS: The orogastric tube tip is in the proximal stomach.

Persistent small bowel distention.

Stable basilar aeration.
IMPRESSION: 1. Orogastric tube with tip in good position.
2. Persistent gaseous distension of small bowel.

## 2016-06-01 IMAGING — DX DG ABDOMEN 2V
2 series · 2 of 2 positions shown · non-contrast
Comparison: Abdominal radiographs, 03/19/2015 and 03/18/2015 and
abdominal CT, 03/17/2015.

CLINICAL DATA: Followup small bowel obstruction versus adynamic
ileus. Pain across lower abdomen, pain is worse when laying flat.
Last BM was today. NG tube in place. Colectomy done on 03-10-15

EXAM:
ABDOMEN - 2 VIEW

[abdomen erect]
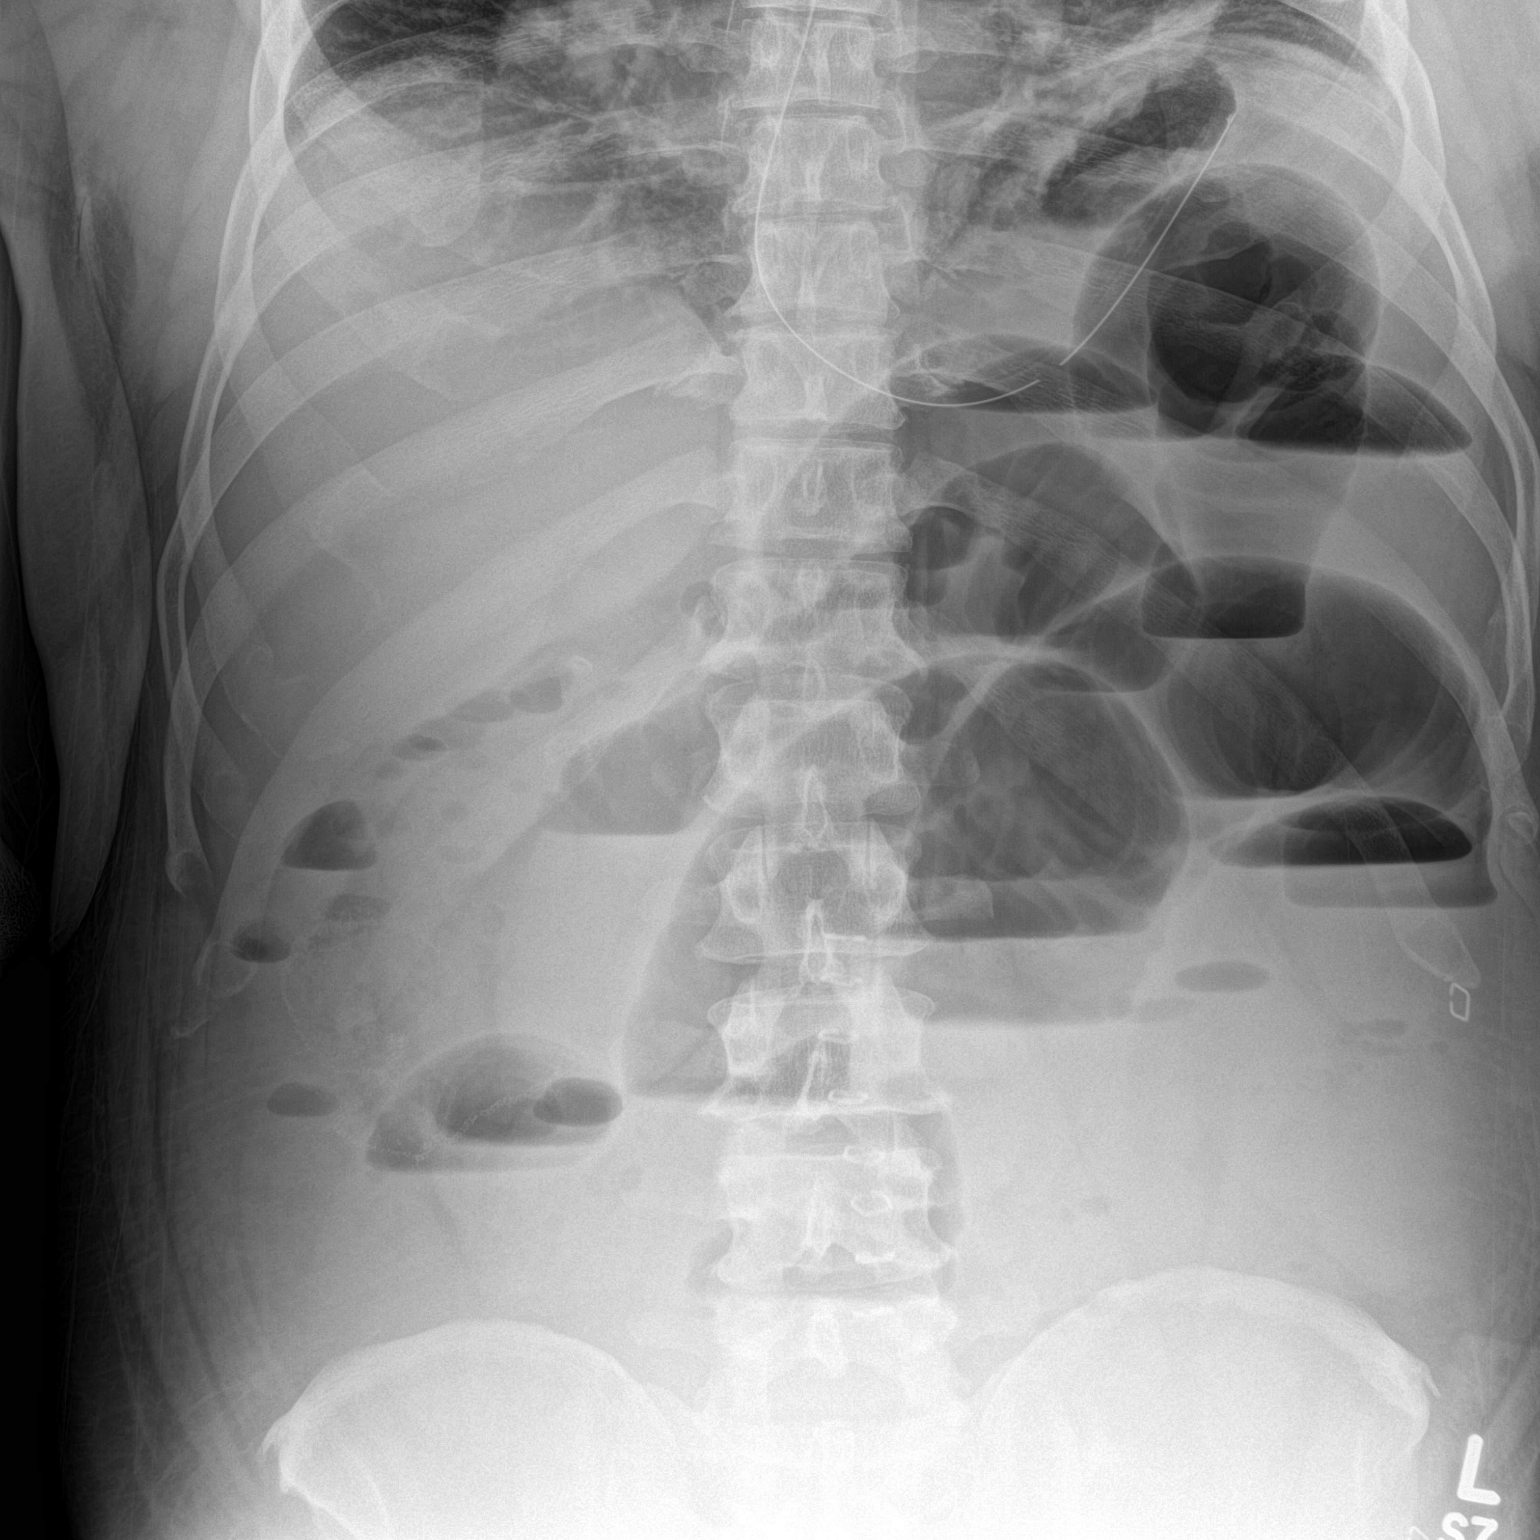

[abdomen supine]
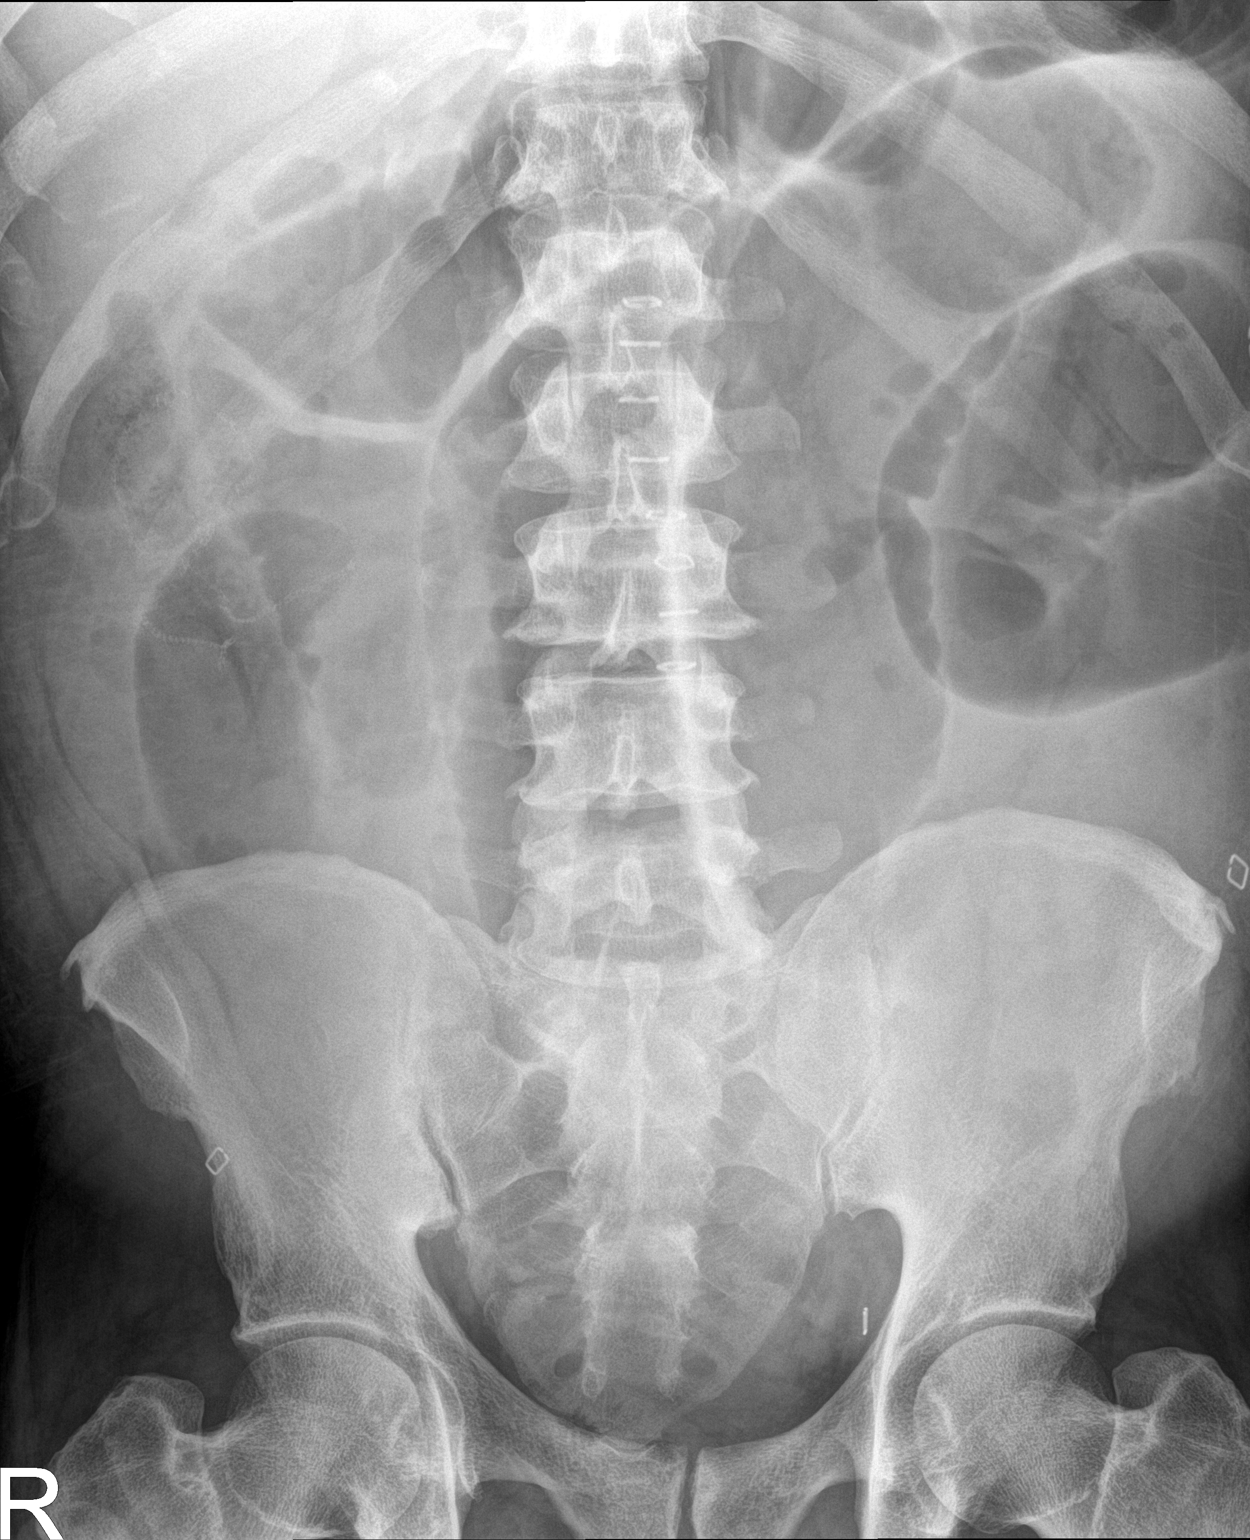

[2 of 2 positions shown; findings below may reference images not displayed]

FINDINGS: There are multiple loops of dilated small bowel with multilevel
air-fluid levels on the erect view. No free air.

There is minimal evidence of colonic gas.

Degree of small bowel dilation is similar to the prior to abdominal
radiographs.

Nasogastric tube is well positioned in the stomach.
IMPRESSION: High-grade small bowel obstruction, similar to the recent to
abdominal radiographs. No free air.

## 2016-06-02 IMAGING — CT CT ABD-PELV W/ CM
2 of 5 series · 15 of 46 positions shown, 17 images · IV contrast (Omni 300)
Comparison: 03/17/2015, 01/27/2015.

ADDENDUM:
At 6 hours since the prior CT imaging, there has been slight
interval progression of the previously ingested oral contrast
material. However, the contrast remains within the small bowel and
has not yet reached the surgical anastomosis. Contrast is a diluted
by fluid within the small bowel lumen.

CT findings remain consistent with severe ileus, or distal small
bowel obstruction.
CLINICAL DATA: Laparoscopic right colectomy 03/10/2015 for cecal
carcinoma. Patient has had test that patient presented [DATE] with
abdominal pain, abdominal distention nausea and vomiting. There has
been persistent marked dilation of the small bowel since that time.
Evaluate for possible early postop obstruction.
EXAM:
CT ABDOMEN AND PELVIS WITH CONTRAST
TECHNIQUE: Multidetector CT imaging of the abdomen and pelvis was performed
using the standard protocol following bolus administration of
intravenous contrast.
CONTRAST:  100 mL OMNIPAQUE IOHEXOL 300 MG/ML IV.

[Series 3: abd/ pelvis 5.0 i30f 1 · axial · 0.96mm/px · z∈[-1163,-683]mm · 12 of 108 slices shown, 14 images]
[im 6/108  soft-tissue]
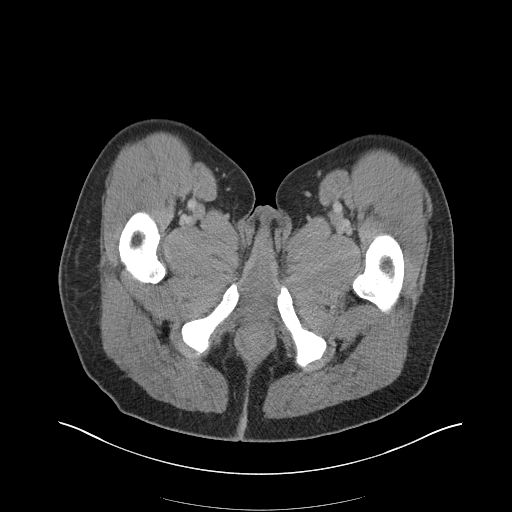
[im 6/108  bone]
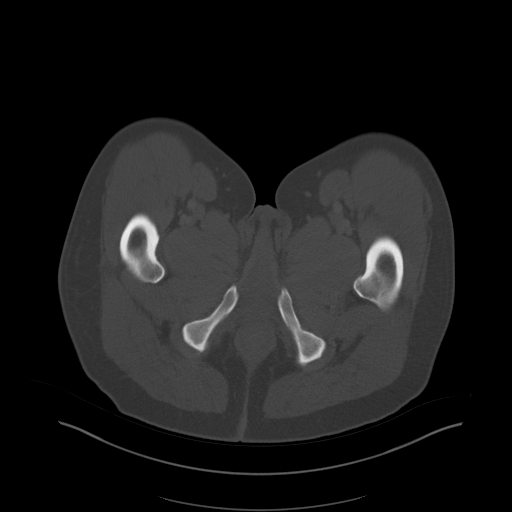
[im 17/108  soft-tissue]
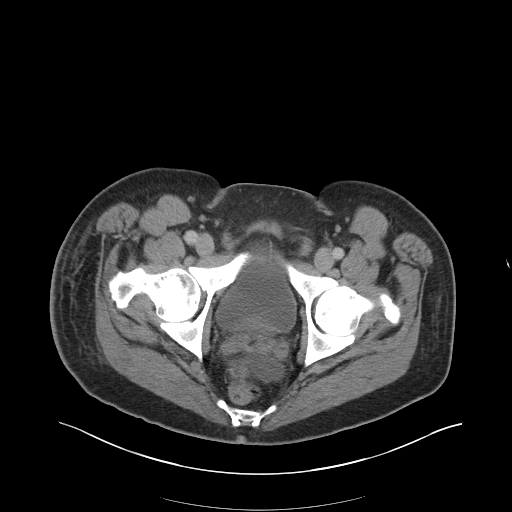
[im 23/108  soft-tissue]
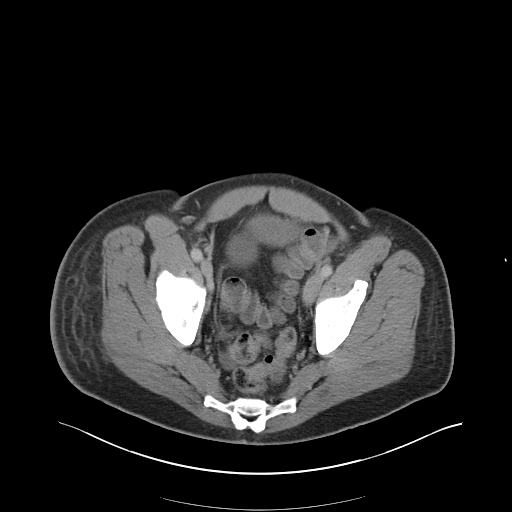
[im 34/108  soft-tissue]
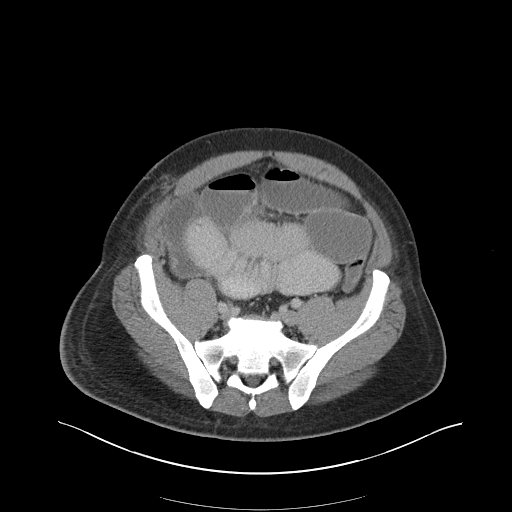
[im 40/108  soft-tissue]
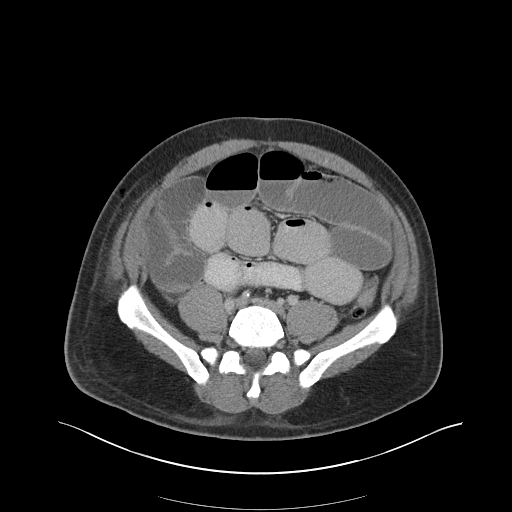
[im 51/108  soft-tissue]
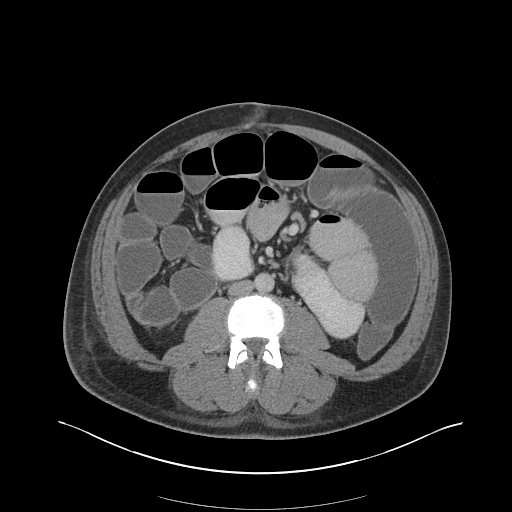
[im 57/108  soft-tissue]
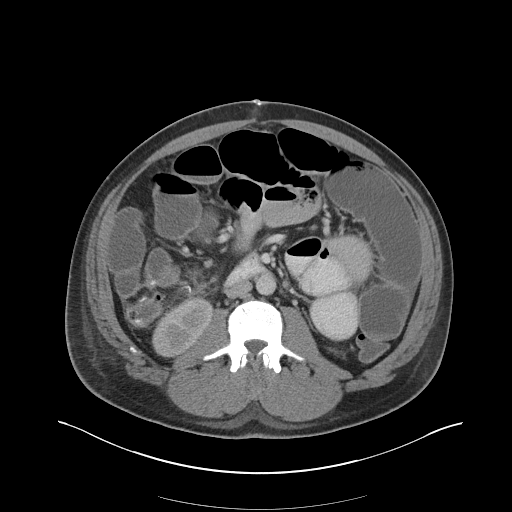
[im 68/108  soft-tissue]
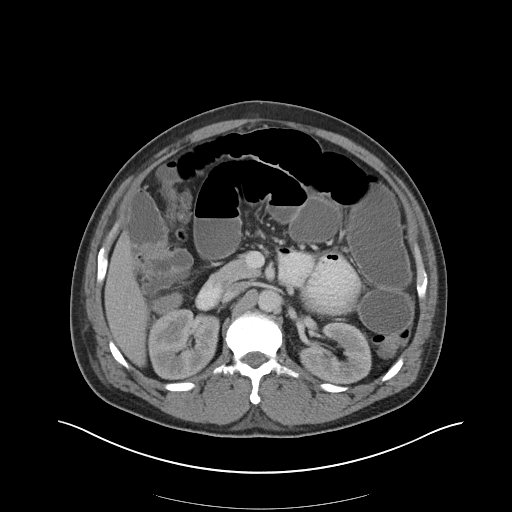
[im 74/108  soft-tissue]
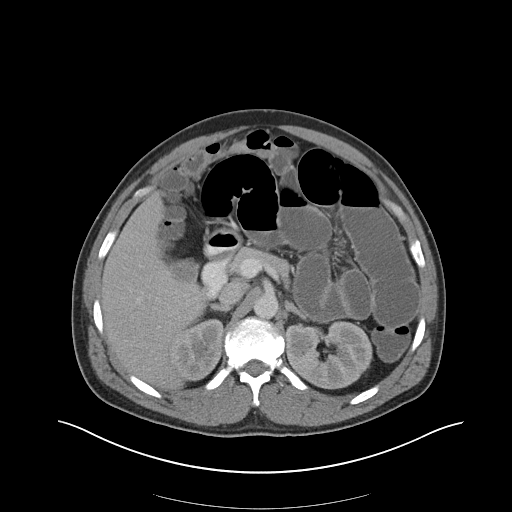
[im 74/108  bone]
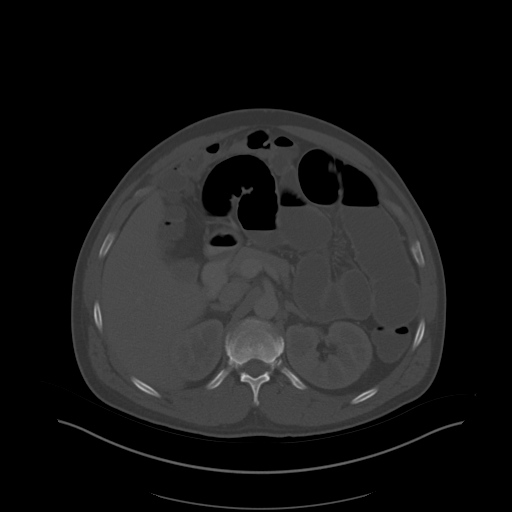
[im 85/108  soft-tissue]
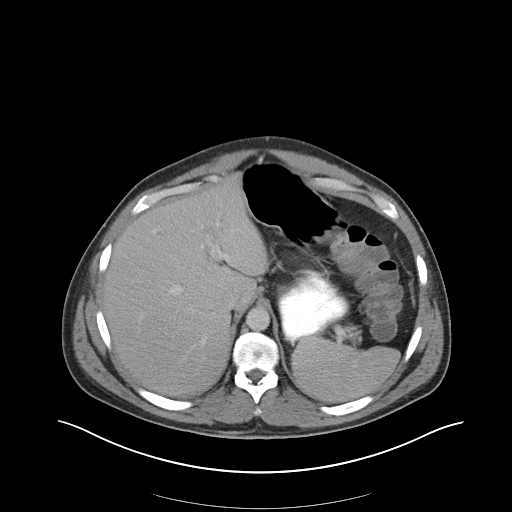
[im 91/108  soft-tissue]
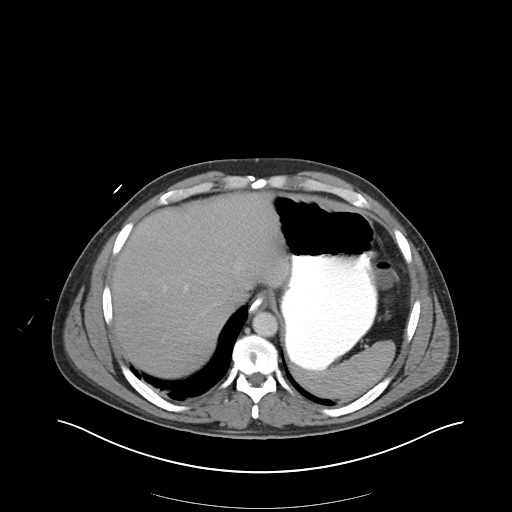
[im 102/108  soft-tissue]
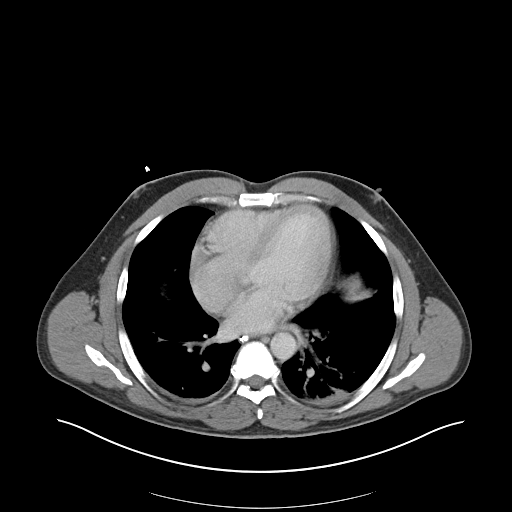

[Series 6: coronals · coronal · 0.77mm/px · 3 of 170 slices shown]
[im 57/170  soft-tissue]
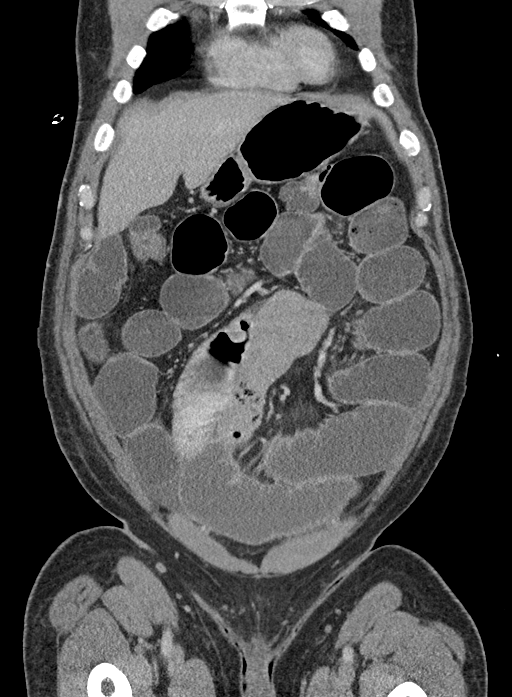
[im 76/170  soft-tissue]
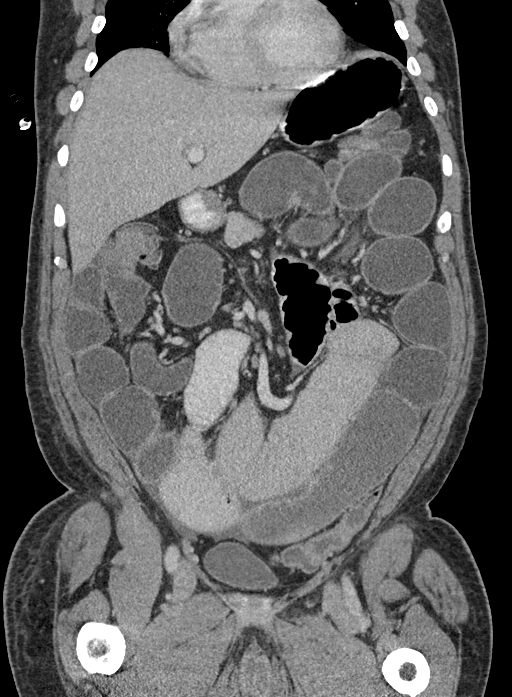
[im 94/170  soft-tissue]
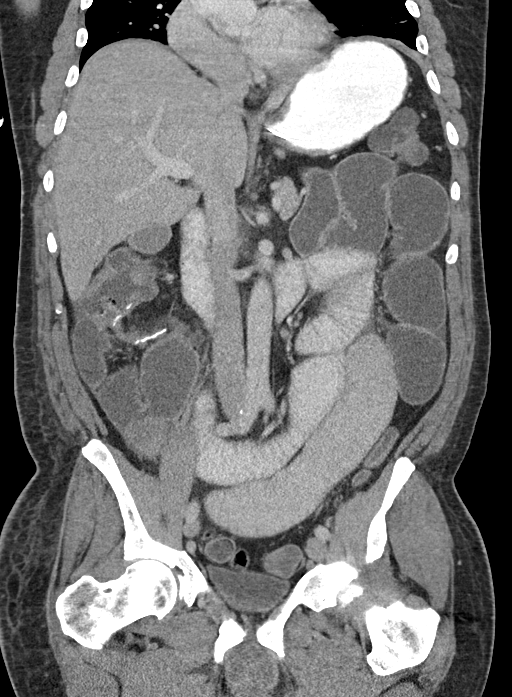

[15 of 46 positions shown; findings below may reference images not displayed]

FINDINGS: Since the CT examination 5 days ago, interval worsening of diffuse
small bowel distention. The small bowel is dilated to the level of
the surgical anastomosis with the hepatic flexure. The entire colon
is decompressed, containing liquid stool. Diffuse colonic
diverticulosis is present without evidence of acute diverticulitis.
The suture line at the over stone into the colon appears intact. The
actual anastomosis is difficult to determine as the oral contrast
has not yet made it to the anastomosis. A small amount of free fluid
is present in the right paracolic gutter and dependently in the low
pelvis, low volume. There is no evidence of free intraperitoneal
air. A nasogastric tube is present in the stomach and the oral
contrast was administered via the tube.

Normal and stable appearance of the liver, spleen, pancreas, adrenal
glands, kidneys and gallbladder. No biliary ductal dilation. Mild
right common iliac artery atherosclerosis is present without visible
atherosclerosis elsewhere. No significant lymphadenopathy.

Small amount of gas in the urinary bladder, presumably from recent
instrumentation. Borderline prostate gland enlargement as noted
previously.

Atelectasis involving the left lower lobe.  Right lung base clear.
IMPRESSION: 1. Interval worsening of diffuse small bowel distention since the CT
5 days ago, and the small bowel is now dilated to the level of the
ileocolic surgical anastomosis. The anastomosis is difficult to
evaluate as the oral contrast has not yet made it there, but as best
I can tell, the anastomosis appears patent.
2. No evidence of free intraperitoneal air. Low volume ascites in
the right paracolic gutter and dependently in the low pelvis.
3. Mild atelectasis involving the left lower lobe.

As the request specifically asked to see whether contrast eventually
enters the colon, the patient will return in approximately 6 hr for
reimaging to see if indeed the contrast has progressed.

## 2016-06-02 IMAGING — CR DG ABDOMEN 2V
2 series · 2 of 2 positions shown · non-contrast
Comparison: None.

CLINICAL DATA: Followup ileus following gastrointestinal surgery,
status post right colectomy on 03/10/2015.

EXAM:
ABDOMEN - 2 VIEW

[abdomen erect]
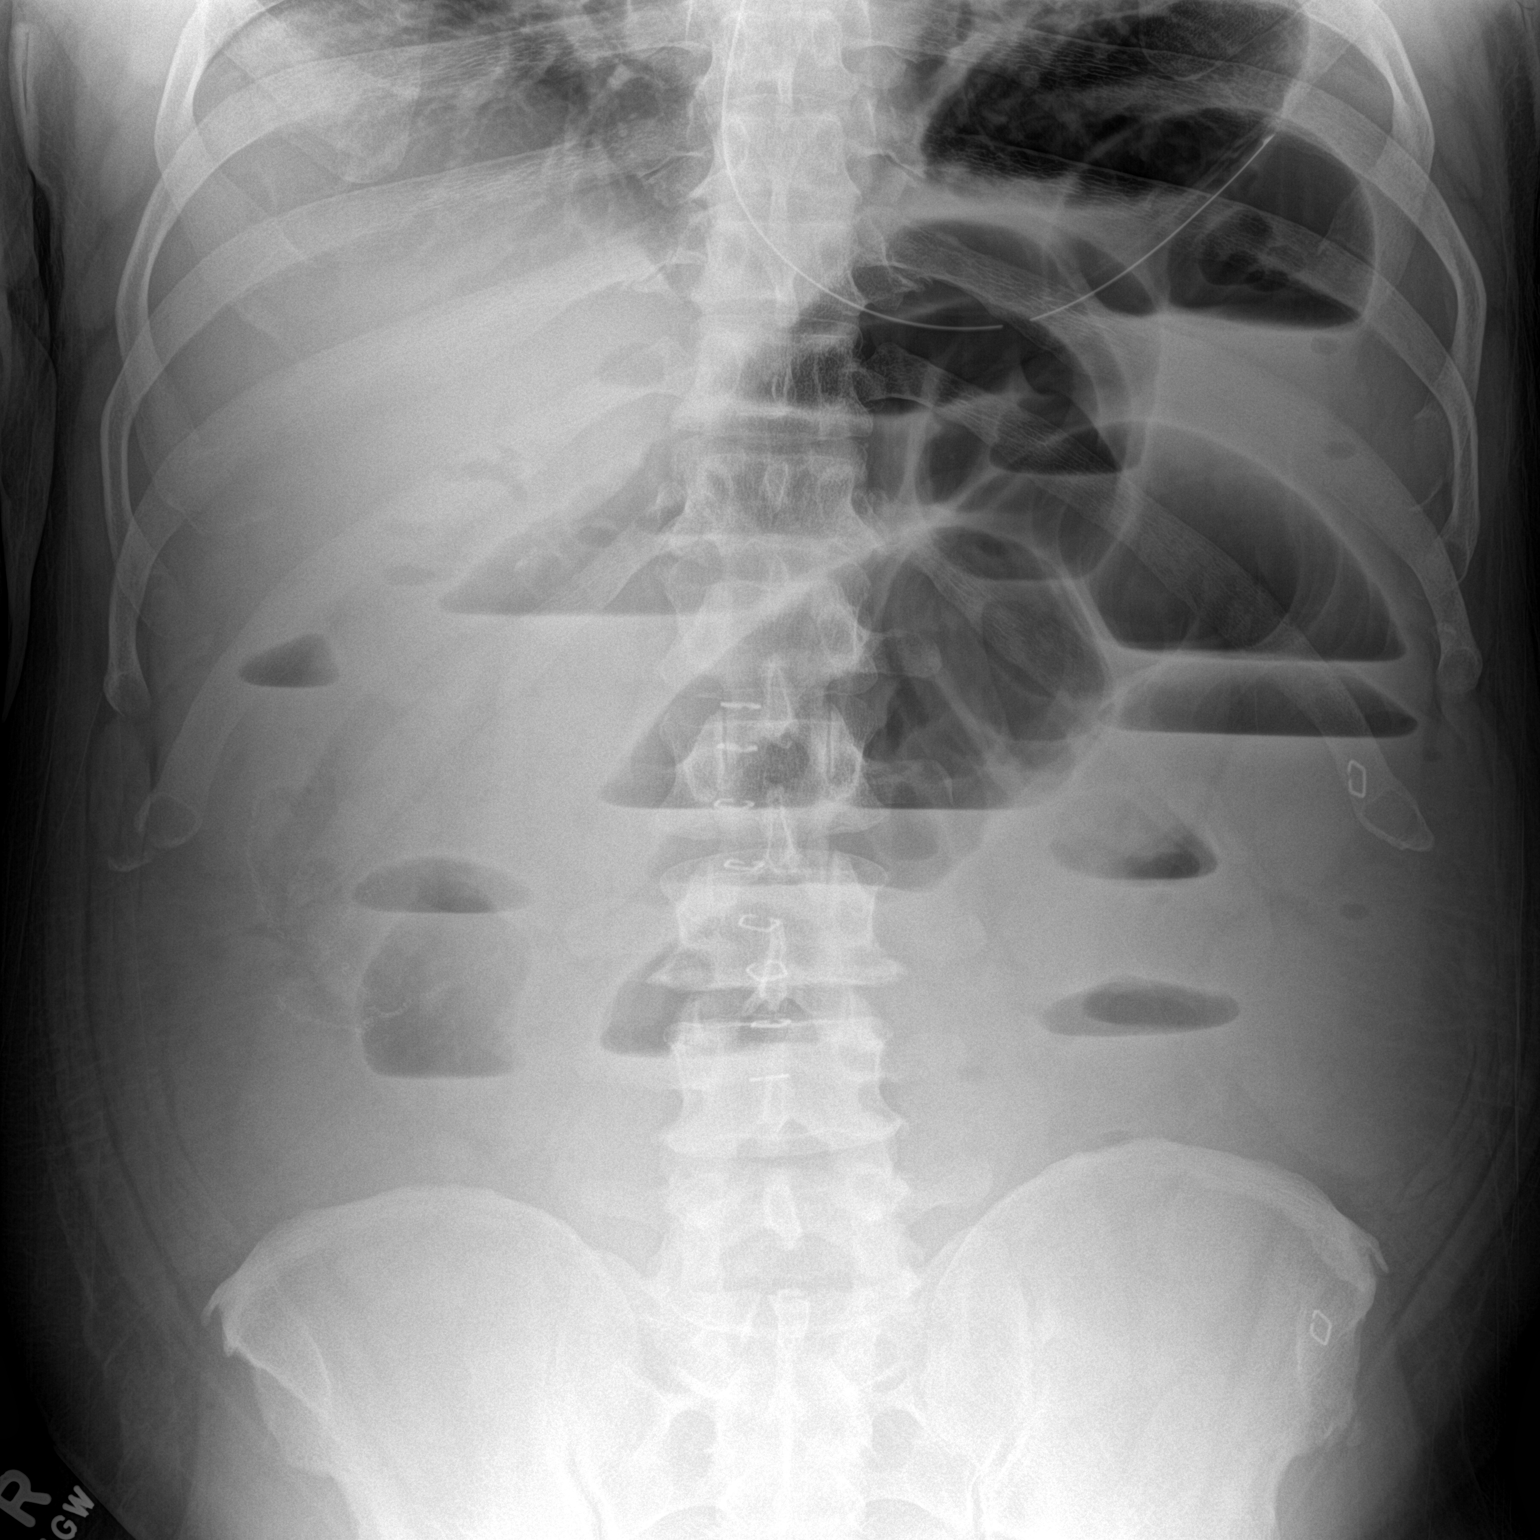

[abdomen supine]
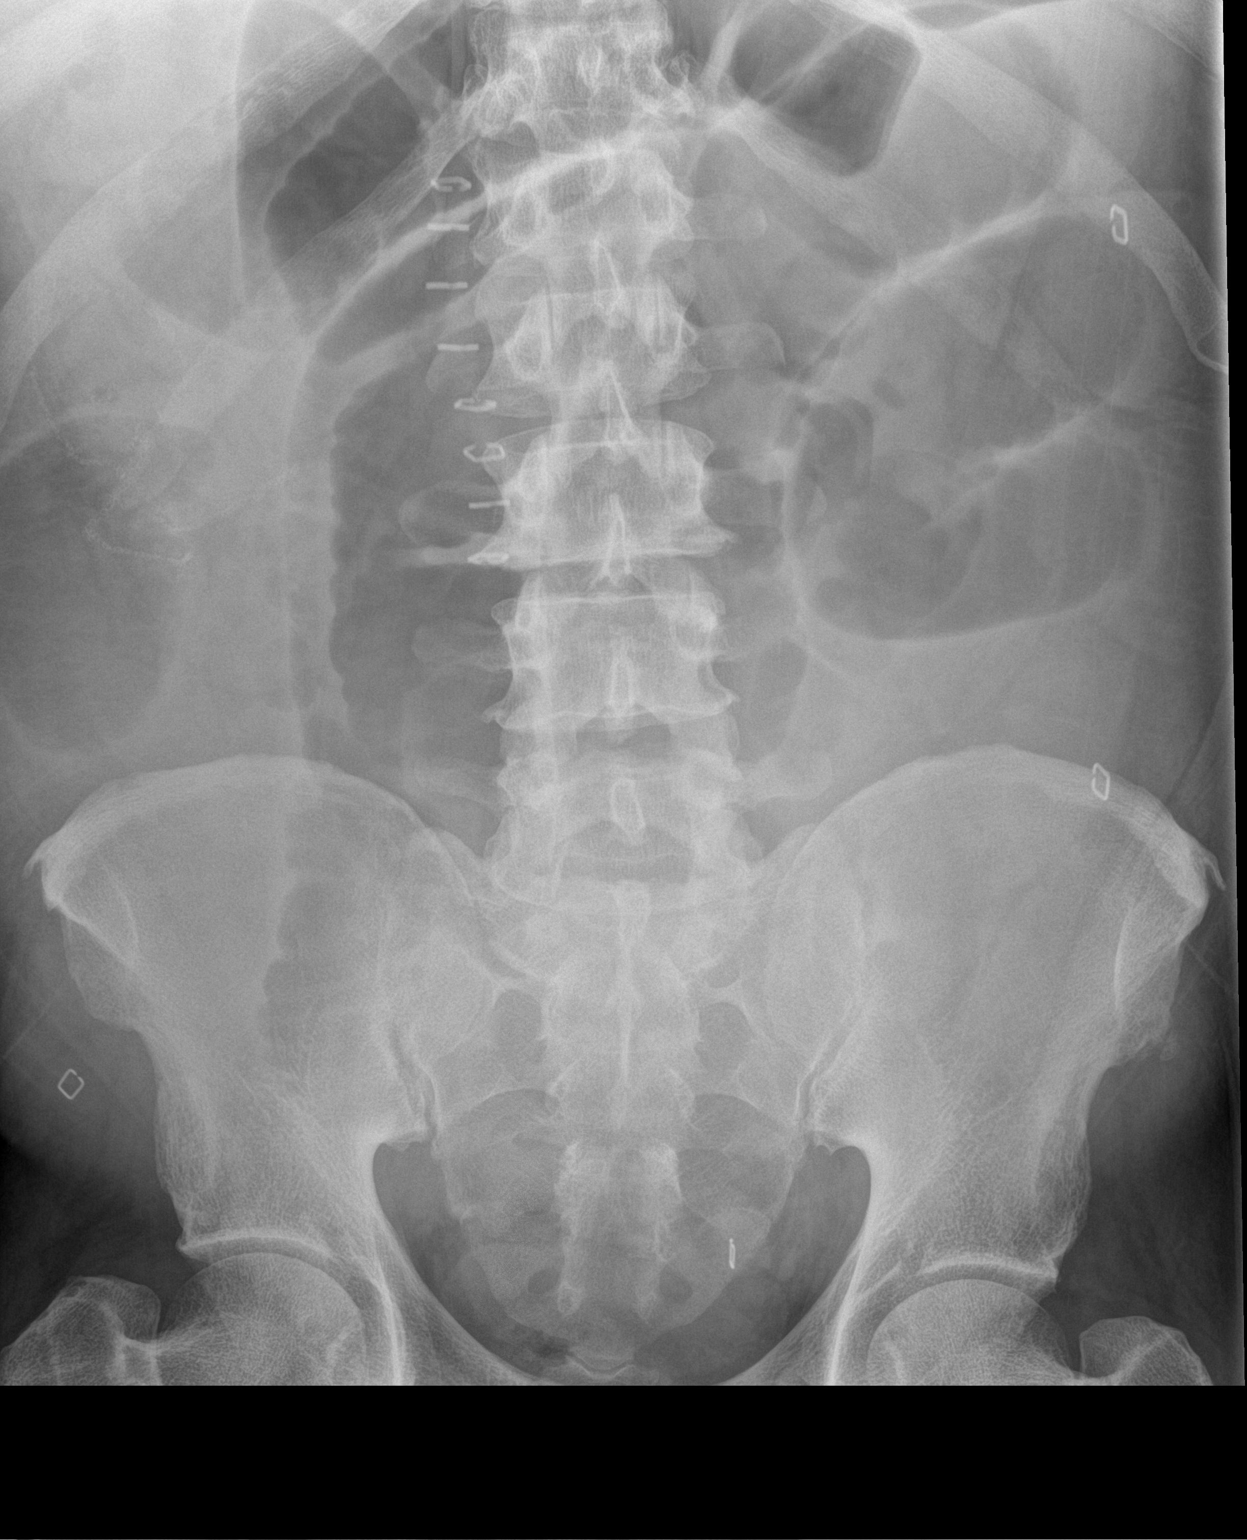

[2 of 2 positions shown; findings below may reference images not displayed]

FINDINGS: The nasogastric tube tip remains in the proximal stomach. No
significant change in multiple air-fluid levels in significantly
dilated small bowel loops. Right mid abdominal surgical staples and
multiple skin clips. No free peritoneal air is seen. Lumbar and
lower thoracic spine degenerative changes.
IMPRESSION: Stable pattern of small bowel obstruction or marked ileus.

## 2016-06-22 ENCOUNTER — Ambulatory Visit (INDEPENDENT_AMBULATORY_CARE_PROVIDER_SITE_OTHER): Payer: BLUE CROSS/BLUE SHIELD | Admitting: Internal Medicine

## 2016-06-22 ENCOUNTER — Encounter: Payer: Self-pay | Admitting: Internal Medicine

## 2016-06-22 VITALS — BP 128/80 | HR 80 | Temp 98.4°F | Ht 72.0 in | Wt 230.1 lb

## 2016-06-22 DIAGNOSIS — I2510 Atherosclerotic heart disease of native coronary artery without angina pectoris: Secondary | ICD-10-CM

## 2016-06-22 DIAGNOSIS — E785 Hyperlipidemia, unspecified: Secondary | ICD-10-CM | POA: Diagnosis not present

## 2016-06-22 DIAGNOSIS — K219 Gastro-esophageal reflux disease without esophagitis: Secondary | ICD-10-CM | POA: Insufficient documentation

## 2016-06-22 DIAGNOSIS — N401 Enlarged prostate with lower urinary tract symptoms: Secondary | ICD-10-CM

## 2016-06-22 DIAGNOSIS — F17200 Nicotine dependence, unspecified, uncomplicated: Secondary | ICD-10-CM

## 2016-06-22 DIAGNOSIS — R351 Nocturia: Secondary | ICD-10-CM

## 2016-06-22 DIAGNOSIS — K5901 Slow transit constipation: Secondary | ICD-10-CM | POA: Insufficient documentation

## 2016-06-22 DIAGNOSIS — M5414 Radiculopathy, thoracic region: Secondary | ICD-10-CM | POA: Insufficient documentation

## 2016-06-22 MED ORDER — LINACLOTIDE 145 MCG PO CAPS: 145.0000 ug | ORAL_CAPSULE | Freq: Every day | ORAL | 3 refills | Status: DC

## 2016-06-22 MED ORDER — PITAVASTATIN CALCIUM 4 MG PO TABS: 1.0000 | ORAL_TABLET | Freq: Every day | ORAL | 11 refills | Status: DC

## 2016-06-22 MED ORDER — DEXLANSOPRAZOLE 60 MG PO CPDR: 60.0000 mg | DELAYED_RELEASE_CAPSULE | Freq: Every day | ORAL | 3 refills | Status: DC

## 2016-06-22 MED ORDER — VARENICLINE TARTRATE 1 MG PO TABS: 1.0000 mg | ORAL_TABLET | Freq: Two times a day (BID) | ORAL | 3 refills | Status: DC

## 2016-06-22 MED ORDER — VARENICLINE TARTRATE 0.5 MG X 11 & 1 MG X 42 PO MISC: ORAL | 0 refills | Status: DC

## 2016-06-22 MED ORDER — SILODOSIN 4 MG PO CAPS: 4.0000 mg | ORAL_CAPSULE | Freq: Every day | ORAL | 3 refills | Status: DC

## 2016-06-22 NOTE — Progress Notes (Signed)
Pre visit review using our clinic review tool, if applicable. No additional management support is needed unless otherwise documented below in the visit note. 

## 2016-06-22 NOTE — Progress Notes (Signed)
Subjective:  Patient ID: [name], male    DOB: Apr 07, 1961  Age: 55 y.o. MRN: [id number]  CC: Gastroesophageal Reflux; Hyperlipidemia; and Coronary Artery Disease   HPI [name] presents for several complaints.  He complains of a 2 week hx of an intermittent numb and tingling sensation around his left mid back into his left flank flank. There has been no rash, pain, or swelling. He denies neck or back pain. He complains of frequent urination but denies dysuria or hematuria.  He also complains of heartburn and says he he has to take quite a few Rolaids to get symptom relief. He denies odynophagia, dysphagia, chest pain, loss of appetite, abdominal pain, or weight loss.  He also complains of intermittent constipation stating he is only having a bowel movement every 2-3 days. He has tried MiraLAX without much symptom relief.  He is ready to quit smoking and wants to try Chantix.  Outpatient Medications Prior to Visit  Medication Sig Dispense Refill  . aspirin EC 81 MG tablet Take 81 mg by mouth daily.    Marland Kitchen buPROPion (WELLBUTRIN SR) 150 MG 12 hr tablet Once daily for 3 days, then increase to twice daily.  Pick a quit day approximately 1-2 weeks after starting. 90 tablet 0  . Multiple Vitamins-Minerals (MULTIVITAMIN PO) Take 1 tablet by mouth daily.    Marland Kitchen ibuprofen (ADVIL,MOTRIN) 200 MG tablet Take 600 mg by mouth every 6 (six) hours as needed for mild pain.     No facility-administered medications prior to visit.     ROS Review of Systems  Constitutional: Negative.  Negative for appetite change, chills, diaphoresis, fatigue and fever.  HENT: Negative for trouble swallowing and voice change.   Eyes: Negative.  Negative for visual disturbance.  Respiratory: Negative for choking, chest tightness, shortness of breath, wheezing and stridor.   Cardiovascular: Negative.  Negative for chest pain, palpitations and leg swelling.  Gastrointestinal: Positive for constipation. Negative for  abdominal distention, abdominal pain, blood in stool, diarrhea, nausea and vomiting.  Genitourinary: Positive for difficulty urinating. Negative for decreased urine volume, dysuria, enuresis, flank pain, frequency, hematuria and urgency.       He complains of nocturia up to 3 times per night, weak urine stream, and urinary straining  Musculoskeletal: Negative.  Negative for back pain, joint swelling and myalgias.  Skin: Negative.  Negative for color change and rash.  Neurological: Negative.   Hematological: Negative.  Negative for adenopathy. Does not bruise/bleed easily.  Psychiatric/Behavioral: Negative.     Objective:  BP 128/80 (BP Location: Left Arm, Patient Position: Sitting, Cuff Size: Large)   Pulse 80   Temp 98.4 F (36.9 C) (Oral)   Ht 6' (1.829 m)   Wt 230 lb 2 oz (104.4 kg)   SpO2 98%   BMI 31.21 kg/m   BP Readings from Last 3 Encounters:  06/22/16 128/80  02/19/16 120/82  10/19/15 120/88    Wt Readings from Last 3 Encounters:  06/22/16 230 lb 2 oz (104.4 kg)  02/19/16 231 lb 5 oz (104.9 kg)  10/19/15 227 lb (103 kg)    Physical Exam  Constitutional: He is oriented to person, place, and time. No distress.  HENT:  Mouth/Throat: Oropharynx is clear and moist. No oropharyngeal exudate.  Eyes: Conjunctivae are normal. Right eye exhibits no discharge. Left eye exhibits no discharge. No scleral icterus.  Neck: Normal range of motion. Neck supple. No JVD present. No tracheal deviation present. No thyromegaly present.  Cardiovascular: Normal  rate, regular rhythm, normal heart sounds and intact distal pulses.  Exam reveals no gallop and no friction rub.   No murmur heard. Pulmonary/Chest: Effort normal and breath sounds normal. No stridor. No respiratory distress. He has no wheezes. He has no rales. He exhibits no tenderness.  Abdominal: Soft. Bowel sounds are normal. He exhibits no distension and no mass. There is no tenderness. There is no rebound and no guarding.    Musculoskeletal: Normal range of motion. He exhibits no edema, tenderness or deformity.  Lymphadenopathy:    He has no cervical adenopathy.  Neurological: He is oriented to person, place, and time.  Skin: Skin is warm and dry. No rash noted. He is not diaphoretic. No erythema. No pallor.  Psychiatric: He has a normal mood and affect. His behavior is normal. Judgment and thought content normal.  Vitals reviewed.   Lab Results  Component Value Date   WBC 4.8 08/13/2015   HGB 13.8 08/13/2015   HCT 41.4 08/13/2015   PLT 226.0 08/13/2015   GLUCOSE 88 08/13/2015   CHOL 152 08/13/2015   TRIG 66.0 08/13/2015   HDL 54.50 08/13/2015   LDLDIRECT 132.4 07/29/2008   LDLCALC 84 08/13/2015   ALT 26 08/13/2015   AST 16 08/13/2015   NA 142 08/13/2015   K 4.6 08/13/2015   CL 106 08/13/2015   CREATININE 1.11 08/13/2015   BUN 21 08/13/2015   CO2 29 08/13/2015   TSH 0.75 08/13/2015   PSA 2.98 11/10/2014   HGBA1C 5.8 08/13/2015   MICROALBUR 1.2 03/22/2012    No results found.  Assessment & Plan:   [name] was seen today for gastroesophageal reflux, hyperlipidemia and coronary artery disease.  Diagnoses and all orders for this visit:  Thoracic neuritis -     Ambulatory referral to Neurology  Atherosclerosis of native coronary artery of native heart without angina pectoris- he needs to start a statin for risk reduction -     Pitavastatin Calcium (LIVALO) 4 MG TABS; Take 1 tablet (4 mg total) by mouth daily.  Hyperlipidemia with target LDL less than 70- he has not achieved his LDL goal, will start a statin. -     Pitavastatin Calcium (LIVALO) 4 MG TABS; Take 1 tablet (4 mg total) by mouth daily.  Slow transit constipation- do not see any evidence of medications causing this or metabolic causes, will treat with Linzess. -     linaclotide (LINZESS) 145 MCG CAPS capsule; Take 1 capsule (145 mcg total) by mouth daily before breakfast.  Gastroesophageal reflux disease without esophagitis-  will start a PPI to treat this -     dexlansoprazole (DEXILANT) 60 MG capsule; Take 1 capsule (60 mg total) by mouth daily.  Benign prostatic hypertrophy (BPH) with nocturia -     silodosin (RAPAFLO) 4 MG CAPS capsule; Take 1 capsule (4 mg total) by mouth daily with breakfast.  TOBACCO ABUSE- will start Chantix and he will also use nicotine patches as well. -     varenicline (CHANTIX CONTINUING MONTH PAK) 1 MG tablet; Take 1 tablet (1 mg total) by mouth 2 (two) times daily. -     varenicline (CHANTIX STARTING MONTH PAK) 0.5 MG X 11 & 1 MG X 42 tablet; Take one 0.5 mg tablet by mouth once daily for 3 days, then increase to one 0.5 mg tablet twice daily for 4 days, then increase to one 1 mg tablet twice daily.   I have discontinued Mr. Kohut's ibuprofen. I am also having him start  on Pitavastatin Calcium, linaclotide, dexlansoprazole, varenicline, varenicline, and silodosin. Additionally, I am having him maintain his aspirin EC, Multiple Vitamins-Minerals (MULTIVITAMIN PO), and buPROPion.  Meds ordered this encounter  Medications  . Pitavastatin Calcium (LIVALO) 4 MG TABS    Sig: Take 1 tablet (4 mg total) by mouth daily.    Dispense:  30 tablet    Refill:  11  . linaclotide (LINZESS) 145 MCG CAPS capsule    Sig: Take 1 capsule (145 mcg total) by mouth daily before breakfast.    Dispense:  90 capsule    Refill:  3  . dexlansoprazole (DEXILANT) 60 MG capsule    Sig: Take 1 capsule (60 mg total) by mouth daily.    Dispense:  90 capsule    Refill:  3  . varenicline (CHANTIX CONTINUING MONTH PAK) 1 MG tablet    Sig: Take 1 tablet (1 mg total) by mouth 2 (two) times daily.    Dispense:  60 tablet    Refill:  3  . varenicline (CHANTIX STARTING MONTH PAK) 0.5 MG X 11 & 1 MG X 42 tablet    Sig: Take one 0.5 mg tablet by mouth once daily for 3 days, then increase to one 0.5 mg tablet twice daily for 4 days, then increase to one 1 mg tablet twice daily.    Dispense:  53 tablet    Refill:  0  .  silodosin (RAPAFLO) 4 MG CAPS capsule    Sig: Take 1 capsule (4 mg total) by mouth daily with breakfast.    Dispense:  90 capsule    Refill:  3     Follow-up: Return in about 1 week (around 06/29/2016).  Scarlette Calico, MD

## 2016-06-22 NOTE — Patient Instructions (Signed)

## 2016-06-28 ENCOUNTER — Ambulatory Visit (INDEPENDENT_AMBULATORY_CARE_PROVIDER_SITE_OTHER): Payer: BLUE CROSS/BLUE SHIELD | Admitting: Internal Medicine

## 2016-06-28 ENCOUNTER — Encounter: Payer: Self-pay | Admitting: Internal Medicine

## 2016-06-28 ENCOUNTER — Other Ambulatory Visit (INDEPENDENT_AMBULATORY_CARE_PROVIDER_SITE_OTHER): Payer: BLUE CROSS/BLUE SHIELD

## 2016-06-28 VITALS — BP 116/72 | HR 67 | Temp 98.6°F | Ht 72.0 in | Wt 232.0 lb

## 2016-06-28 DIAGNOSIS — Z202 Contact with and (suspected) exposure to infections with a predominantly sexual mode of transmission: Secondary | ICD-10-CM

## 2016-06-28 DIAGNOSIS — N401 Enlarged prostate with lower urinary tract symptoms: Secondary | ICD-10-CM

## 2016-06-28 DIAGNOSIS — R351 Nocturia: Secondary | ICD-10-CM | POA: Diagnosis not present

## 2016-06-28 DIAGNOSIS — Z0000 Encounter for general adult medical examination without abnormal findings: Secondary | ICD-10-CM

## 2016-06-28 DIAGNOSIS — K5901 Slow transit constipation: Secondary | ICD-10-CM | POA: Diagnosis not present

## 2016-06-28 DIAGNOSIS — Z0001 Encounter for general adult medical examination with abnormal findings: Secondary | ICD-10-CM

## 2016-06-28 DIAGNOSIS — E785 Hyperlipidemia, unspecified: Secondary | ICD-10-CM | POA: Diagnosis not present

## 2016-06-28 LAB — URINALYSIS, ROUTINE W REFLEX MICROSCOPIC
Bilirubin Urine: NEGATIVE
Hgb urine dipstick: NEGATIVE
KETONES UR: NEGATIVE
Leukocytes, UA: NEGATIVE
Nitrite: NEGATIVE
RBC / HPF: NONE SEEN (ref 0–?)
Specific Gravity, Urine: 1.02 (ref 1.000–1.030)
Total Protein, Urine: NEGATIVE
URINE GLUCOSE: NEGATIVE
UROBILINOGEN UA: 0.2 (ref 0.0–1.0)
pH: 5.5 (ref 5.0–8.0)

## 2016-06-28 LAB — CBC WITH DIFFERENTIAL/PLATELET
BASOS PCT: 0.9 % (ref 0.0–3.0)
Basophils Absolute: 0.1 10*3/uL (ref 0.0–0.1)
EOS ABS: 0.2 10*3/uL (ref 0.0–0.7)
EOS PCT: 3.5 % (ref 0.0–5.0)
HCT: 41.4 % (ref 39.0–52.0)
Hemoglobin: 14 g/dL (ref 13.0–17.0)
LYMPHS ABS: 1.5 10*3/uL (ref 0.7–4.0)
Lymphocytes Relative: 26.9 % (ref 12.0–46.0)
MCHC: 33.9 g/dL (ref 30.0–36.0)
MCV: 81.7 fl (ref 78.0–100.0)
MONO ABS: 0.5 10*3/uL (ref 0.1–1.0)
Monocytes Relative: 8.8 % (ref 3.0–12.0)
NEUTROS ABS: 3.4 10*3/uL (ref 1.4–7.7)
NEUTROS PCT: 59.9 % (ref 43.0–77.0)
PLATELETS: 244 10*3/uL (ref 150.0–400.0)
RBC: 5.06 Mil/uL (ref 4.22–5.81)
RDW: 13.3 % (ref 11.5–15.5)
WBC: 5.7 10*3/uL (ref 4.0–10.5)

## 2016-06-28 LAB — COMPREHENSIVE METABOLIC PANEL
ALBUMIN: 4.2 g/dL (ref 3.5–5.2)
ALT: 20 U/L (ref 0–53)
AST: 17 U/L (ref 0–37)
Alkaline Phosphatase: 90 U/L (ref 39–117)
BUN: 17 mg/dL (ref 6–23)
CHLORIDE: 104 meq/L (ref 96–112)
CO2: 30 mEq/L (ref 19–32)
CREATININE: 1.1 mg/dL (ref 0.40–1.50)
Calcium: 9.3 mg/dL (ref 8.4–10.5)
GFR: 90.06 mL/min (ref >60.00)
GLUCOSE: 123 mg/dL — AB (ref 70–99)
POTASSIUM: 4.3 meq/L (ref 3.5–5.1)
SODIUM: 142 meq/L (ref 135–145)
Total Bilirubin: 0.4 mg/dL (ref 0.2–1.2)
Total Protein: 7.2 g/dL (ref 6.0–8.3)

## 2016-06-28 LAB — LIPID PANEL
CHOL/HDL RATIO: 3
Cholesterol: 131 mg/dL (ref 0–200)
HDL: 42.7 mg/dL (ref >39.00)
LDL Cholesterol: 57 mg/dL (ref 0–99)
NONHDL: 87.82
TRIGLYCERIDES: 152 mg/dL — AB (ref 0.0–149.0)
VLDL: 30.4 mg/dL (ref 0.0–40.0)

## 2016-06-28 LAB — PSA: PSA: 3.36 ng/mL (ref 0.10–4.00)

## 2016-06-28 LAB — FECAL OCCULT BLOOD, GUAIAC: FECAL OCCULT BLD: NEGATIVE

## 2016-06-28 LAB — TSH: TSH: 1.08 u[IU]/mL (ref 0.35–4.50)

## 2016-06-28 NOTE — Progress Notes (Signed)
Subjective:  Patient ID: [name], male    DOB: [birth year]  Age: 55 y.o. MRN: [id number]  CC: Annual Exam   HPI ANDREU PALIN presents for a CPX.   He tells me he thinks he's recently been exposed to an STD and wants to be tested for STDs. He has had no symptoms of infection such as dysuria, penile discharge, lymphadenopathy, penile ulcer, rash, sore throat, fever, or chills.  He continues to complain of nocturia and weak urine stream. He did not start the alpha blocker that I prescribed a week ago. He tells me the constipation has improved with Linzess and Dexilant has helped with his heartburn.  Outpatient Medications Prior to Visit  Medication Sig Dispense Refill  . aspirin EC 81 MG tablet Take 81 mg by mouth daily.    Marland Kitchen buPROPion (WELLBUTRIN SR) 150 MG 12 hr tablet Once daily for 3 days, then increase to twice daily.  Pick a quit day approximately 1-2 weeks after starting. 90 tablet 0  . dexlansoprazole (DEXILANT) 60 MG capsule Take 1 capsule (60 mg total) by mouth daily. 90 capsule 3  . linaclotide (LINZESS) 145 MCG CAPS capsule Take 1 capsule (145 mcg total) by mouth daily before breakfast. 90 capsule 3  . Multiple Vitamins-Minerals (MULTIVITAMIN PO) Take 1 tablet by mouth daily.    . Pitavastatin Calcium (LIVALO) 4 MG TABS Take 1 tablet (4 mg total) by mouth daily. 30 tablet 11  . silodosin (RAPAFLO) 4 MG CAPS capsule Take 1 capsule (4 mg total) by mouth daily with breakfast. 90 capsule 3  . varenicline (CHANTIX CONTINUING MONTH PAK) 1 MG tablet Take 1 tablet (1 mg total) by mouth 2 (two) times daily. 60 tablet 3  . varenicline (CHANTIX STARTING MONTH PAK) 0.5 MG X 11 & 1 MG X 42 tablet Take one 0.5 mg tablet by mouth once daily for 3 days, then increase to one 0.5 mg tablet twice daily for 4 days, then increase to one 1 mg tablet twice daily. 53 tablet 0   No facility-administered medications prior to visit.     ROS Review of Systems  Constitutional: Negative.  Negative  for activity change, chills, diaphoresis, fatigue, fever and unexpected weight change.  HENT: Negative.  Negative for facial swelling, sinus pressure, sore throat, trouble swallowing and voice change.   Eyes: Negative.  Negative for visual disturbance.  Respiratory: Negative.  Negative for cough, choking, chest tightness, shortness of breath and stridor.   Cardiovascular: Negative.  Negative for chest pain, palpitations and leg swelling.  Gastrointestinal: Positive for constipation. Negative for abdominal pain, blood in stool, diarrhea, nausea and vomiting.  Endocrine: Negative.   Genitourinary: Positive for difficulty urinating. Negative for decreased urine volume, discharge, dysuria, enuresis, flank pain, frequency, hematuria, penile swelling, scrotal swelling, testicular pain and urgency.  Musculoskeletal: Negative.  Negative for arthralgias, back pain, myalgias and neck pain.  Skin: Negative.  Negative for color change.  Allergic/Immunologic: Negative.   Neurological: Negative.   Hematological: Negative.  Negative for adenopathy. Does not bruise/bleed easily.  Psychiatric/Behavioral: Negative.     Objective:  BP 116/72 (BP Location: Left Arm, Patient Position: Sitting, Cuff Size: Large)   Pulse 67   Temp 98.6 F (37 C) (Oral)   Ht 6' (1.829 m)   Wt 232 lb (105.2 kg)   SpO2 98%   BMI 31.46 kg/m   BP Readings from Last 3 Encounters:  06/28/16 116/72  06/22/16 128/80  02/19/16 120/82    Wt Readings  from Last 3 Encounters:  06/28/16 232 lb (105.2 kg)  06/22/16 230 lb 2 oz (104.4 kg)  02/19/16 231 lb 5 oz (104.9 kg)    Physical Exam  Constitutional: He is oriented to person, place, and time. No distress.  HENT:  Mouth/Throat: Oropharynx is clear and moist. No oropharyngeal exudate.  Eyes: Conjunctivae are normal. Right eye exhibits no discharge. Left eye exhibits no discharge. No scleral icterus.  Neck: Normal range of motion. Neck supple. No JVD present. No tracheal  deviation present. No thyromegaly present.  Cardiovascular: Normal rate, regular rhythm, normal heart sounds and intact distal pulses.  Exam reveals no gallop and no friction rub.   No murmur heard. Pulmonary/Chest: Effort normal and breath sounds normal. No stridor. No respiratory distress. He has no wheezes. He has no rales. He exhibits no tenderness.  Abdominal: Soft. Bowel sounds are normal. He exhibits no distension and no mass. There is no tenderness. There is no rebound and no guarding. Hernia confirmed negative in the right inguinal area and confirmed negative in the left inguinal area.  Genitourinary: Rectum normal, testes normal and penis normal. Rectal exam shows no external hemorrhoid, no internal hemorrhoid, no fissure, no mass, no tenderness, anal tone normal and guaiac negative stool. Prostate is enlarged (2+ smooth symm BPH). Prostate is not tender. Right testis shows no mass, no swelling and no tenderness. Right testis is descended. Left testis shows no mass, no swelling and no tenderness. Left testis is descended. Circumcised. No penile erythema or penile tenderness. No discharge found.  Musculoskeletal: Normal range of motion. He exhibits no edema, tenderness or deformity.  Lymphadenopathy:    He has no cervical adenopathy.       Right: No inguinal adenopathy present.       Left: No inguinal adenopathy present.  Neurological: He is oriented to person, place, and time.  Skin: Skin is warm and dry. No rash noted. He is not diaphoretic. No erythema. No pallor.  Psychiatric: He has a normal mood and affect. His behavior is normal. Judgment and thought content normal.  Vitals reviewed.   Lab Results  Component Value Date   WBC 5.7 06/28/2016   HGB 14.0 06/28/2016   HCT 41.4 06/28/2016   PLT 244.0 06/28/2016   GLUCOSE 123 (H) 06/28/2016   CHOL 131 06/28/2016   TRIG 152.0 (H) 06/28/2016   HDL 42.70 06/28/2016   LDLDIRECT 132.4 07/29/2008   LDLCALC 57 06/28/2016   ALT 20  06/28/2016   AST 17 06/28/2016   NA 142 06/28/2016   K 4.3 06/28/2016   CL 104 06/28/2016   CREATININE 1.10 06/28/2016   BUN 17 06/28/2016   CO2 30 06/28/2016   TSH 1.08 06/28/2016   PSA 3.36 06/28/2016   HGBA1C 5.8 08/13/2015   MICROALBUR 1.2 03/22/2012    No results found.  Assessment & Plan:   Devesh was seen today for annual exam.  Diagnoses and all orders for this visit:  Routine general medical examination at a health care facility- Exam completed, labs ordered and reviewed, vaccines reviewed, his colonoscopy is up-to-date, patient education material was given. -     Lipid panel; Future -     PSA; Future -     TSH; Future -     Comprehensive metabolic panel; Future -     CBC with Differential/Platelet; Future  Exposure to sexually transmitted disease (STD)- his screens for STDs are all negative, he was encouraged to practice safe sexual practices. -  GC/chlamydia probe amp, urine; Future -     HIV antibody; Future -     RPR; Future  Benign prostatic hypertrophy (BPH) with nocturia- I have asked him to start taking the Rapaflo that was prescribed during his last visit. -     Urinalysis, Routine w reflex microscopic (not at [facility]); Future  Slow transit constipation- improved with Linzess  Hyperlipidemia with target LDL less than 70- he has achieved his LDL goal is doing well on the statin.   I am having Mr. Morace maintain his aspirin EC, Multiple Vitamins-Minerals (MULTIVITAMIN PO), buPROPion, Pitavastatin Calcium, linaclotide, dexlansoprazole, varenicline, varenicline, and silodosin.  No orders of the defined types were placed in this encounter.    Follow-up: Return in about 6 months (around 12/29/2016).  Scarlette Calico, MD

## 2016-06-28 NOTE — Progress Notes (Signed)
Pre visit review using our clinic review tool, if applicable. No additional management support is needed unless otherwise documented below in the visit note. 

## 2016-06-28 NOTE — Patient Instructions (Signed)

## 2016-06-29 ENCOUNTER — Encounter: Payer: Self-pay | Admitting: Internal Medicine

## 2016-06-29 LAB — HIV ANTIBODY (ROUTINE TESTING W REFLEX): HIV 1&2 Ab, 4th Generation: NONREACTIVE

## 2016-06-29 LAB — GC/CHLAMYDIA PROBE AMP
CT Probe RNA: NOT DETECTED
GC Probe RNA: NOT DETECTED

## 2016-06-29 LAB — RPR

## 2016-07-28 ENCOUNTER — Ambulatory Visit: Payer: Self-pay | Admitting: Neurology

## 2016-11-05 ENCOUNTER — Emergency Department (HOSPITAL_COMMUNITY): Payer: BLUE CROSS/BLUE SHIELD

## 2016-11-05 ENCOUNTER — Emergency Department (HOSPITAL_COMMUNITY)
Admission: EM | Admit: 2016-11-05 | Discharge: 2016-11-05 | Disposition: A | Discharge status: Home / Self Care | Payer: BLUE CROSS/BLUE SHIELD | Attending: Emergency Medicine | Admitting: Emergency Medicine

## 2016-11-05 ENCOUNTER — Encounter (HOSPITAL_COMMUNITY): Payer: Self-pay

## 2016-11-05 DIAGNOSIS — R109 Unspecified abdominal pain: Secondary | ICD-10-CM | POA: Diagnosis present

## 2016-11-05 DIAGNOSIS — Z7982 Long term (current) use of aspirin: Secondary | ICD-10-CM | POA: Insufficient documentation

## 2016-11-05 DIAGNOSIS — F17200 Nicotine dependence, unspecified, uncomplicated: Secondary | ICD-10-CM | POA: Diagnosis not present

## 2016-11-05 LAB — CBC WITH DIFFERENTIAL/PLATELET
BASOS PCT: 0 %
Basophils Absolute: 0 10*3/uL (ref 0.0–0.1)
Eosinophils Absolute: 0.2 10*3/uL (ref 0.0–0.7)
Eosinophils Relative: 4 %
HEMATOCRIT: 38.5 % — AB (ref 39.0–52.0)
Hemoglobin: 13.1 g/dL (ref 13.0–17.0)
LYMPHS ABS: 1.5 10*3/uL (ref 0.7–4.0)
LYMPHS PCT: 26 %
MCH: 27.9 pg (ref 26.0–34.0)
MCHC: 34 g/dL (ref 30.0–36.0)
MCV: 81.9 fL (ref 78.0–100.0)
MONO ABS: 0.6 10*3/uL (ref 0.1–1.0)
MONOS PCT: 10 %
NEUTROS ABS: 3.5 10*3/uL (ref 1.7–7.7)
Neutrophils Relative %: 60 %
Platelets: 235 10*3/uL (ref 150–400)
RBC: 4.7 MIL/uL (ref 4.22–5.81)
RDW: 13.2 % (ref 11.5–15.5)
WBC: 5.7 10*3/uL (ref 4.0–10.5)

## 2016-11-05 LAB — URINALYSIS, ROUTINE W REFLEX MICROSCOPIC
BILIRUBIN URINE: NEGATIVE
GLUCOSE, UA: NEGATIVE mg/dL
HGB URINE DIPSTICK: NEGATIVE
Ketones, ur: NEGATIVE mg/dL
Leukocytes, UA: NEGATIVE
Nitrite: NEGATIVE
Protein, ur: NEGATIVE mg/dL
SPECIFIC GRAVITY, URINE: 1.008 (ref 1.005–1.030)
pH: 7 (ref 5.0–8.0)

## 2016-11-05 LAB — COMPREHENSIVE METABOLIC PANEL
ALT: 20 U/L (ref 17–63)
AST: 19 U/L (ref 15–41)
Albumin: 4.2 g/dL (ref 3.5–5.0)
Alkaline Phosphatase: 80 U/L (ref 38–126)
Anion gap: 9 (ref 5–15)
BILIRUBIN TOTAL: 0.6 mg/dL (ref 0.3–1.2)
BUN: 14 mg/dL (ref 6–20)
CHLORIDE: 107 mmol/L (ref 101–111)
CO2: 23 mmol/L (ref 22–32)
CREATININE: 1.04 mg/dL (ref 0.61–1.24)
Calcium: 8.9 mg/dL (ref 8.9–10.3)
GFR calc Af Amer: >60 mL/min (ref >60)
GLUCOSE: 96 mg/dL (ref 65–99)
Potassium: 3.9 mmol/L (ref 3.5–5.1)
Sodium: 139 mmol/L (ref 135–145)
Total Protein: 7.2 g/dL (ref 6.5–8.1)

## 2016-11-05 LAB — LIPASE, BLOOD: LIPASE: 24 U/L (ref 11–51)

## 2016-11-05 LAB — I-STAT TROPONIN, ED: TROPONIN I, POC: 0 ng/mL (ref 0.00–0.08)

## 2016-11-05 MED ORDER — ONDANSETRON HCL 4 MG/2ML IJ SOLN
4.0000 mg | Freq: Once | INTRAMUSCULAR | Status: AC
  Administered 2016-11-05: 4 mg via INTRAVENOUS
  Filled 2016-11-05: qty 2

## 2016-11-05 MED ORDER — DOCUSATE SODIUM 100 MG PO CAPS: 100.0000 mg | ORAL_CAPSULE | Freq: Two times a day (BID) | ORAL | 0 refills | Status: DC

## 2016-11-05 MED ORDER — DICYCLOMINE HCL 20 MG PO TABS: 20.0000 mg | ORAL_TABLET | Freq: Two times a day (BID) | ORAL | 0 refills | Status: DC

## 2016-11-05 MED ORDER — POLYETHYLENE GLYCOL 3350 17 GM/SCOOP PO POWD: 17.0000 g | Freq: Two times a day (BID) | ORAL | 0 refills | Status: DC

## 2016-11-05 MED ORDER — IOPAMIDOL (ISOVUE-300) INJECTION 61%
INTRAVENOUS | Status: AC
  Administered 2016-11-05: 100 mL via INTRAVENOUS
  Filled 2016-11-05: qty 100

## 2016-11-05 MED ORDER — HYDROMORPHONE HCL 1 MG/ML IJ SOLN
1.0000 mg | Freq: Once | INTRAMUSCULAR | Status: AC
  Administered 2016-11-05: 1 mg via INTRAVENOUS
  Filled 2016-11-05: qty 1

## 2016-11-05 MED ORDER — SODIUM CHLORIDE 0.9 % IJ SOLN
INTRAMUSCULAR | Status: AC
  Filled 2016-11-05: qty 50

## 2016-11-05 NOTE — ED Triage Notes (Addendum)
PT C/O RUQ PAIN GOING THROUGH TO HIS BACK X4 DAYS. PT STS HE HAD BLOATING X1 WEEK. PT STS HE HAD ABDOMINAL SURGERY 2 YEARS AGO, AND SINCE THEN HE HAS HAD TO TAKE LAXATIVES TO HAVE A BM. PT STS HE RAN OUT OF HIS MEDICATION. LAST BM WAS YESTERDAY, VERY HARD AND NOT ENOUGH,PER PT.PT HAS HAD FEVER, AND NAUSEA.

## 2016-11-05 NOTE — ED Notes (Signed)
Patient transported to CT 

## 2016-11-05 NOTE — ED Provider Notes (Signed)
[facility] DEPT Provider Note   CSN: [id number] Arrival date & time: 11/05/16  1601     History   Chief Complaint Chief Complaint  Patient presents with  . Abdominal Pain    HPI [name] is a 55 y.o. male.  Patient presents emergency department with chief complaint of right-sided abdominal pain, bloating, and cough. He states that he has been having the symptoms for the past 2-3 days. Reports having intermittent fevers and chills at home. He states that he feels nauseated when he eats, but denies any vomiting. He denies any diarrhea, but states that he has had abnormal bowel movements since having surgery the past.  States his pain is worsened with palpation.  States the pain radiates from his right upper quadrant to his back.   The history is provided by the patient. No language interpreter was used.    Past Medical History:  Diagnosis Date  . Cancer ([place])   . Chronic rhinitis   . Coronary atherosclerosis of unspecified type of vessel, native or graft   . Dysmetabolic syndrome X   . Erectile dysfunction   . Hypertrophy of prostate with urinary obstruction and other lower urinary tract symptoms (LUTS)   . Obstructive sleep apnea (adult) (pediatric)    no CPAP, has lost weight  . Problems related to high-risk sexual behavior   . Pure hypercholesterolemia   . Sickle cell anemia (HCC)   . Tobacco use disorder     Patient Active Problem List   Diagnosis Date Noted  . Thoracic neuritis 06/22/2016  . Slow transit constipation 06/22/2016  . Gastroesophageal reflux disease without esophagitis 06/22/2016  . S/P right colectomy 03/10/2015  . Radiculitis of left cervical region 01/06/2014  . Routine general medical examination at a health care facility 05/21/2012  . Exposure to sexually transmitted disease (STD) 05/21/2012  . OBSTRUCTIVE SLEEP APNEA 12/31/2008  . TOBACCO ABUSE 07/31/2008  . Coronary atherosclerosis 07/31/2008  . Benign prostatic hypertrophy (BPH)  with nocturia 07/31/2008  . Hyperlipidemia with target LDL less than 70 07/25/2008  . ERECTILE DYSFUNCTION 08/25/2007    Past Surgical History:  Procedure Laterality Date  . COSMETIC SURGERY    . HERNIA REPAIR     UHR  AT [facility]  <5 YRS AGO   . INCISION AND DRAINAGE ABSCESS / HEMATOMA OF BURSA / KNEE / THIGH     I&D of complex abscess,left axilla. I&D of large infected pilonidal abscess  . LAPAROSCOPIC PARTIAL COLECTOMY N/A 03/10/2015   Procedure: LAPAROSCOPIC ASSISTED RIGHT COLECTOMY;  Surgeon: Rolm Bookbinder, MD;  Location: [facility];  Service: General;  Laterality: N/A;  . MASS EXCISION N/A 10/19/2015   Procedure: EXCISION BACK MASS;  Surgeon: Rolm Bookbinder, MD;  Location: [facility];  Service: General;  Laterality: N/A;  . PTCA  2010       Home Medications    Prior to Admission medications   Medication Sig Start Date End Date Taking? Authorizing Provider  aspirin EC 81 MG tablet Take 81 mg by mouth daily.    Historical Provider, MD  buPROPion (WELLBUTRIN SR) 150 MG 12 hr tablet Once daily for 3 days, then increase to twice daily.  Pick a quit day approximately 1-2 weeks after starting. 02/19/16   Coral Spikes, DO  dexlansoprazole (DEXILANT) 60 MG capsule Take 1 capsule (60 mg total) by mouth daily. 06/22/16   Janith Lima, MD  linaclotide [facility]) 145 MCG CAPS capsule Take 1 capsule (145 mcg total) by mouth daily  before breakfast. 06/22/16   Janith Lima, MD  Multiple Vitamins-Minerals (MULTIVITAMIN PO) Take 1 tablet by mouth daily.    Historical Provider, MD  Pitavastatin Calcium (LIVALO) 4 MG TABS Take 1 tablet (4 mg total) by mouth daily. 06/22/16   Janith Lima, MD  silodosin (RAPAFLO) 4 MG CAPS capsule Take 1 capsule (4 mg total) by mouth daily with breakfast. 06/22/16   Janith Lima, MD  varenicline (CHANTIX CONTINUING MONTH PAK) 1 MG tablet Take 1 tablet (1 mg total) by mouth 2 (two) times daily. 06/22/16   Janith Lima, MD  varenicline (CHANTIX  STARTING MONTH PAK) 0.5 MG X 11 & 1 MG X 42 tablet Take one 0.5 mg tablet by mouth once daily for 3 days, then increase to one 0.5 mg tablet twice daily for 4 days, then increase to one 1 mg tablet twice daily. 06/22/16   Janith Lima, MD    Family History Family History  Problem Relation Age of Onset  . Breast cancer Mother   . Colon cancer Maternal Grandmother   . Stroke Maternal Grandmother     Over 88  . Colon cancer Maternal Grandfather   . Prostate cancer Maternal Grandfather   . Colon cancer Other     1st degree relative<60  . Heart attack Father     Over 57  . Esophageal cancer Neg Hx   . Stomach cancer Neg Hx   . Rectal cancer Neg Hx     Social History Social History  Substance Use Topics  . Smoking status: Current Every Day Smoker    Packs/day: 1.00    Years: 25.00  . Smokeless tobacco: Never Used  . Alcohol use 0.0 oz/week     Comment: Occasional     Allergies   Penicillins   Review of Systems Review of Systems  Gastrointestinal: Positive for abdominal pain and nausea. Negative for vomiting.  All other systems reviewed and are negative.    Physical Exam Updated Vital Signs BP 140/92 (BP Location: Left Arm)   Pulse 73   Temp 98.2 F (36.8 C) (Oral)   Resp 18   Ht 6' (1.829 m)   Wt 93.8 kg   SpO2 99%   BMI 28.05 kg/m   Physical Exam  Constitutional: He is oriented to person, place, and time. He appears well-developed and well-nourished.  HENT:  Head: Normocephalic and atraumatic.  Eyes: Conjunctivae and EOM are normal. Pupils are equal, round, and reactive to light. Right eye exhibits no discharge. Left eye exhibits no discharge. No scleral icterus.  Neck: Normal range of motion. Neck supple. No JVD present.  Cardiovascular: Normal rate, regular rhythm and normal heart sounds.  Exam reveals no gallop and no friction rub.   No murmur heard. Pulmonary/Chest: Effort normal and breath sounds normal. No respiratory distress. He has no wheezes. He  has no rales. He exhibits no tenderness.  Abdominal: Soft. He exhibits no distension and no mass. There is tenderness. There is no rebound and no guarding.  Musculoskeletal: Normal range of motion. He exhibits no edema or tenderness.  Neurological: He is alert and oriented to person, place, and time.  Skin: Skin is warm and dry.  Psychiatric: He has a normal mood and affect. His behavior is normal. Judgment and thought content normal.  Nursing note and vitals reviewed.    ED Treatments / Results  Labs (all labs ordered are listed, but only abnormal results are displayed) Labs Reviewed  URINALYSIS, [place]  MICROSCOPIC - Abnormal; Notable for the following:       Result Value   Color, Urine STRAW (*)    All other components within normal limits  CBC WITH DIFFERENTIAL/PLATELET - Abnormal; Notable for the following:    HCT 38.5 (*)    All other components within normal limits  LIPASE, BLOOD  COMPREHENSIVE METABOLIC PANEL  I-STAT TROPOININ, ED    EKG  EKG Interpretation None       Radiology Dg Chest 2 View  Result Date: 11/05/2016 CLINICAL DATA:  Right upper quadrant pain for several days, shortness of Breath EXAM: CHEST  2 VIEW COMPARISON:  08/13/2015 FINDINGS: The heart size and mediastinal contours are within normal limits. Both lungs are clear. The visualized skeletal structures are unremarkable. IMPRESSION: No active cardiopulmonary disease. Electronically Signed   By: Inez Catalina M.D.   On: 11/05/2016 17:41   Ct Abdomen Pelvis W Contrast  Result Date: 11/05/2016 CLINICAL DATA:  Right upper quadrant pain radiating through to the back. Bloating for 1 week. Constipation. Fever and nausea. EXAM: CT ABDOMEN AND PELVIS WITH CONTRAST TECHNIQUE: Multidetector CT imaging of the abdomen and pelvis was performed using the standard protocol following bolus administration of intravenous contrast. CONTRAST:  125mL ISOVUE-300 IOPAMIDOL (ISOVUE-300) INJECTION 61% COMPARISON:  March 22, 2015 FINDINGS: Lower chest: No acute abnormality. Hepatobiliary: No focal liver abnormality is seen. No gallstones, gallbladder wall thickening, or biliary dilatation. Pancreas: Unremarkable. No pancreatic ductal dilatation or surrounding inflammatory changes. Spleen: Normal in size without focal abnormality. Adrenals/Urinary Tract: Adrenal glands are unremarkable. Kidneys are normal, without renal calculi, focal lesion, or hydronephrosis. Bladder is unremarkable. Stomach/Bowel: The stomach is normal in appearance. The small bowel obstruction seen on the previous study has resolved. Visualized small bowel is normal today. The patient is status post partial colonic resection with anastomosis in the right side of the abdomen. No colonic inflammation is identified today. Mild fecal loading in the colon. No obstruction identified. Vascular/Lymphatic: No significant vascular findings are present. No enlarged abdominal or pelvic lymph nodes. Reproductive: Prostate is unremarkable. Other: Fat containing ventral hernia. No other additional abnormalities. Musculoskeletal: No acute or significant osseous findings. IMPRESSION: 1. No acute abnormalities.  Mild fecal loading in the colon. Electronically Signed   By: Dorise Bullion III M.D   On: 11/05/2016 18:16    Procedures Procedures (including critical care time)  Medications Ordered in ED Medications  HYDROmorphone (DILAUDID) injection 1 mg (not administered)  ondansetron (ZOFRAN) injection 4 mg (not administered)     Initial Impression / Assessment and Plan / ED Course  I have reviewed the triage vital signs and the nursing notes.  Pertinent labs & imaging results that were available during my care of the patient were reviewed by me and considered in my medical decision making (see chart for details).  Clinical Course     Patient with abdominal pain x 3 days.  States that it feels tight when he passes gas.  No vomiting.  No recorded fever.  Hx of  multiple abdominal surgeries.  Will check CT.  CT is negative save for mild fecal loading of the colon.  No gallbladder wall thickening or gallstones.  Labs are reassuring.  Will treat colace and miralx.  Recommend PCP follow-up.  Return precautions given.  Patient instructed to return for:  New or worsening symptoms, including, increased abdominal pain, especially pain that localizes to one side, bloody vomit, bloody diarrhea, fever >101, and intractable vomiting.  Final Clinical Impressions(s) / ED Diagnoses  Final diagnoses:  Abdominal pain, unspecified abdominal location    New Prescriptions Discharge Medication List as of 11/05/2016  7:25 PM    START taking these medications   Details  dicyclomine (BENTYL) 20 MG tablet Take 1 tablet (20 mg total) by mouth 2 (two) times daily., Starting Sat 11/05/2016, Print    docusate sodium (COLACE) 100 MG capsule Take 1 capsule (100 mg total) by mouth every 12 (twelve) hours., Starting Sat 11/05/2016, Print    polyethylene glycol powder (GLYCOLAX/MIRALAX) powder Take 17 g by mouth 2 (two) times daily. Until daily soft stools  OTC, Starting Sat 11/05/2016, Print         Montine Circle, PA-C 11/05/16 2012    Deno Etienne, DO 11/06/16 0005

## 2016-11-05 NOTE — ED Notes (Signed)
ED Provider at bedside. 

## 2016-11-05 NOTE — ED Notes (Addendum)
Patient transported to XR. 

## 2016-11-07 ENCOUNTER — Ambulatory Visit (INDEPENDENT_AMBULATORY_CARE_PROVIDER_SITE_OTHER): Payer: BLUE CROSS/BLUE SHIELD | Admitting: Nurse Practitioner

## 2016-11-07 ENCOUNTER — Encounter: Payer: Self-pay | Admitting: Nurse Practitioner

## 2016-11-07 VITALS — BP 112/76 | HR 68 | Temp 98.6°F | Ht 72.0 in | Wt 238.0 lb

## 2016-11-07 DIAGNOSIS — Z23 Encounter for immunization: Secondary | ICD-10-CM | POA: Diagnosis not present

## 2016-11-07 DIAGNOSIS — R140 Abdominal distension (gaseous): Secondary | ICD-10-CM | POA: Diagnosis not present

## 2016-11-07 DIAGNOSIS — S39012A Strain of muscle, fascia and tendon of lower back, initial encounter: Secondary | ICD-10-CM | POA: Diagnosis not present

## 2016-11-07 DIAGNOSIS — K5901 Slow transit constipation: Secondary | ICD-10-CM

## 2016-11-07 DIAGNOSIS — Z9049 Acquired absence of other specified parts of digestive tract: Secondary | ICD-10-CM

## 2016-11-07 MED ORDER — DICLOFENAC SODIUM 2 % TD SOLN
1.0000 | Freq: Two times a day (BID) | TRANSDERMAL | 1 refills | Status: DC
Start: 2016-11-07 — End: 2016-12-16

## 2016-11-07 MED ORDER — METHOCARBAMOL 500 MG PO TABS: 500.0000 mg | ORAL_TABLET | Freq: Three times a day (TID) | ORAL | 0 refills | Status: DC | PRN

## 2016-11-07 MED ORDER — LINACLOTIDE 145 MCG PO CAPS: 145.0000 ug | ORAL_CAPSULE | Freq: Every day | ORAL | 3 refills | Status: DC

## 2016-11-07 MED ORDER — KETOROLAC TROMETHAMINE 30 MG/ML IJ SOLN
30.0000 mg | Freq: Once | INTRAMUSCULAR | Status: AC
  Administered 2016-11-07: 30 mg via INTRAMUSCULAR

## 2016-11-07 NOTE — Patient Instructions (Signed)
Muscle Strain A muscle strain (pulled muscle) happens when a muscle is stretched beyond normal length. It happens when a sudden, violent force stretches your muscle too far. Usually, a few of the fibers in your muscle are torn. Muscle strain is common in athletes. Recovery usually takes 1-2 weeks. Complete healing takes 5-6 weeks. Follow these instructions at home:  Follow the PRICE method of treatment to help your injury get better. Do this the first 2-3 days after the injury: ? Protect. Protect the muscle to keep it from getting injured again. ? Rest. Limit your activity and rest the injured body part. ? Ice. Put ice in a plastic bag. Place a towel between your skin and the bag. Then, apply the ice and leave it on from 15-20 minutes each hour. After the third day, switch to moist heat packs. ? Compression. Use a splint or elastic bandage on the injured area for comfort. Do not put it on too tightly. ? Elevate. Keep the injured body part above the level of your heart.  Only take medicine as told by your doctor.  Warm up before doing exercise to prevent future muscle strains. Contact a doctor if:  You have more pain or puffiness (swelling) in the injured area.  You feel numbness, tingling, or notice a loss of strength in the injured area. This information is not intended to replace advice given to you by your health care provider. Make sure you discuss any questions you have with your health care provider. Document Released: 08/23/2008 Document Revised: 04/21/2016 Document Reviewed: 06/13/2013 Elsevier Interactive Patient Education  2017 [employer] Inc.  

## 2016-11-07 NOTE — Progress Notes (Signed)
Pre visit review using our clinic review tool, if applicable. No additional management support is needed unless otherwise documented below in the visit note. 

## 2016-11-07 NOTE — Progress Notes (Signed)
Subjective:  Patient ID: [name], male    DOB: [birth year]  Age: 55 y.o. MRN: [id number]  CC: Follow-up (ER followup for pain on the right back. flu shot? digestion problem)   Back Pain  This is a new problem. The current episode started in the past 7 days. The problem occurs intermittently. The problem has been waxing and waning since onset. The pain is present in the lumbar spine (right side). The quality of the pain is described as cramping and aching. The pain does not radiate. The symptoms are aggravated by bending and twisting. Pertinent negatives include no abdominal pain, bladder incontinence, bowel incontinence, chest pain, dysuria, fever, headaches, numbness, tingling, weakness or weight loss. Risk factors include recent trauma (lifting 200Ibs at [facility]). He has tried heat and bed rest for the symptoms. The treatment provided mild relief.  Abdominal Pain  This is a chronic problem. The current episode started more than 1 month ago. The onset quality is gradual. The problem occurs intermittently. The problem has been waxing and waning. The pain is located in the generalized abdominal region. The quality of the pain is colicky and a sensation of fullness. The abdominal pain does not radiate. Associated symptoms include belching, constipation and flatus. Pertinent negatives include no anorexia, arthralgias, diarrhea, dysuria, fever, frequency, headaches, hematochezia, hematuria, melena, myalgias, nausea, vomiting or weight loss. Nothing aggravates the pain. The pain is relieved by nothing. He has tried proton pump inhibitors (bentyl, miralax, colace and linzess) for the symptoms. The treatment provided mild relief. Prior diagnostic workup includes GI consult. His past medical history is significant for GERD and irritable bowel syndrome. There is no history of pancreatitis.   Onset of symptoms after weight lighting.  Evaluated at hospital on Saturday. CT ABD/Pelvis (normal), CXR (normal),  Labs: CBC, CMP, lipase, troponin, UA (normal).  He is requesting referral to GI to due to constipation and persistent ABD fullness.  Outpatient Medications Prior to Visit  Medication Sig Dispense Refill  . aspirin EC 81 MG tablet Take 81 mg by mouth daily.    Marland Kitchen buPROPion (WELLBUTRIN SR) 150 MG 12 hr tablet Once daily for 3 days, then increase to twice daily.  Pick a quit day approximately 1-2 weeks after starting. 90 tablet 0  . dicyclomine (BENTYL) 20 MG tablet Take 1 tablet (20 mg total) by mouth 2 (two) times daily. 20 tablet 0  . docusate sodium (COLACE) 100 MG capsule Take 1 capsule (100 mg total) by mouth every 12 (twelve) hours. 30 capsule 0  . Multiple Vitamins-Minerals (MULTIVITAMIN PO) Take 1 tablet by mouth daily.    . Pitavastatin Calcium (LIVALO) 4 MG TABS Take 1 tablet (4 mg total) by mouth daily. 30 tablet 11  . polyethylene glycol powder (GLYCOLAX/MIRALAX) powder Take 17 g by mouth 2 (two) times daily. Until daily soft stools  OTC 250 g 0  . linaclotide (LINZESS) 145 MCG CAPS capsule Take 1 capsule (145 mcg total) by mouth daily before breakfast. 90 capsule 3  . dexlansoprazole (DEXILANT) 60 MG capsule Take 1 capsule (60 mg total) by mouth daily. (Patient not taking: Reported on 11/07/2016) 90 capsule 3  . silodosin (RAPAFLO) 4 MG CAPS capsule Take 1 capsule (4 mg total) by mouth daily with breakfast. (Patient not taking: Reported on 11/07/2016) 90 capsule 3  . varenicline (CHANTIX CONTINUING MONTH PAK) 1 MG tablet Take 1 tablet (1 mg total) by mouth 2 (two) times daily. (Patient not taking: Reported on 11/07/2016) 60 tablet 3  .  varenicline (CHANTIX STARTING MONTH PAK) 0.5 MG X 11 & 1 MG X 42 tablet Take one 0.5 mg tablet by mouth once daily for 3 days, then increase to one 0.5 mg tablet twice daily for 4 days, then increase to one 1 mg tablet twice daily. (Patient not taking: Reported on 11/07/2016) 53 tablet 0   No facility-administered medications prior to visit.      ROS See HPI  Objective:  BP 112/76   Pulse 68   Temp 98.6 F (37 C)   Ht 6' (1.829 m)   Wt 238 lb (108 kg)   SpO2 98%   BMI 32.28 kg/m   BP Readings from Last 3 Encounters:  11/07/16 112/76  11/05/16 129/81  06/28/16 116/72    Wt Readings from Last 3 Encounters:  11/07/16 238 lb (108 kg)  11/05/16 206 lb 12.8 oz (93.8 kg)  06/28/16 232 lb (105.2 kg)    Physical Exam  Constitutional: He is oriented to person, place, and time. No distress.  Neck: Normal range of motion. Neck supple.  Cardiovascular: Normal rate and normal heart sounds.   Pulmonary/Chest: Effort normal and breath sounds normal.  Abdominal: Soft. Bowel sounds are normal. He exhibits distension. He exhibits no mass. There is tenderness. There is no rebound and no guarding.  Diffuse ABD tenderness  Musculoskeletal: He exhibits tenderness. He exhibits no edema.       Thoracic back: Normal. He exhibits normal range of motion.       Lumbar back: He exhibits tenderness.  Right mid upper lumbar paraspinanous tenderness.  Neurological: He is alert and oriented to person, place, and time.  Skin: Skin is warm and dry. No rash noted. No erythema.  Vitals reviewed.   Lab Results  Component Value Date   WBC 5.7 11/05/2016   HGB 13.1 11/05/2016   HCT 38.5 (L) 11/05/2016   PLT 235 11/05/2016   GLUCOSE 96 11/05/2016   CHOL 131 06/28/2016   TRIG 152.0 (H) 06/28/2016   HDL 42.70 06/28/2016   LDLDIRECT 132.4 07/29/2008   LDLCALC 57 06/28/2016   ALT 20 11/05/2016   AST 19 11/05/2016   NA 139 11/05/2016   K 3.9 11/05/2016   CL 107 11/05/2016   CREATININE 1.04 11/05/2016   BUN 14 11/05/2016   CO2 23 11/05/2016   TSH 1.08 06/28/2016   PSA 3.36 06/28/2016   HGBA1C 5.8 08/13/2015   MICROALBUR 1.2 03/22/2012    Dg Chest 2 View  Result Date: 11/05/2016 CLINICAL DATA:  Right upper quadrant pain for several days, shortness of Breath EXAM: CHEST  2 VIEW COMPARISON:  08/13/2015 FINDINGS: The heart size and  mediastinal contours are within normal limits. Both lungs are clear. The visualized skeletal structures are unremarkable. IMPRESSION: No active cardiopulmonary disease. Electronically Signed   By: Inez Catalina M.D.   On: 11/05/2016 17:41   Ct Abdomen Pelvis W Contrast  Result Date: 11/05/2016 CLINICAL DATA:  Right upper quadrant pain radiating through to the back. Bloating for 1 week. Constipation. Fever and nausea. EXAM: CT ABDOMEN AND PELVIS WITH CONTRAST TECHNIQUE: Multidetector CT imaging of the abdomen and pelvis was performed using the standard protocol following bolus administration of intravenous contrast. CONTRAST:  150mL ISOVUE-300 IOPAMIDOL (ISOVUE-300) INJECTION 61% COMPARISON:  March 22, 2015 FINDINGS: Lower chest: No acute abnormality. Hepatobiliary: No focal liver abnormality is seen. No gallstones, gallbladder wall thickening, or biliary dilatation. Pancreas: Unremarkable. No pancreatic ductal dilatation or surrounding inflammatory changes. Spleen: Normal in size without focal abnormality. Adrenals/Urinary  Tract: Adrenal glands are unremarkable. Kidneys are normal, without renal calculi, focal lesion, or hydronephrosis. Bladder is unremarkable. Stomach/Bowel: The stomach is normal in appearance. The small bowel obstruction seen on the previous study has resolved. Visualized small bowel is normal today. The patient is status post partial colonic resection with anastomosis in the right side of the abdomen. No colonic inflammation is identified today. Mild fecal loading in the colon. No obstruction identified. Vascular/Lymphatic: No significant vascular findings are present. No enlarged abdominal or pelvic lymph nodes. Reproductive: Prostate is unremarkable. Other: Fat containing ventral hernia. No other additional abnormalities. Musculoskeletal: No acute or significant osseous findings. IMPRESSION: 1. No acute abnormalities.  Mild fecal loading in the colon. Electronically Signed   By: Dorise Bullion III M.D   On: 11/05/2016 18:16    Assessment & Plan:   Jermir was seen today for follow-up.  Diagnoses and all orders for this visit:  Strain of lumbar paraspinous muscle, initial encounter -     ketorolac (TORADOL) 30 MG/ML injection 30 mg; Inject 1 mL (30 mg total) into the muscle once. -     Diclofenac Sodium (PENNSAID) 2 % SOLN; Place 1 application onto the skin 2 (two) times daily. -     methocarbamol (ROBAXIN) 500 MG tablet; Take 1 tablet (500 mg total) by mouth every 8 (eight) hours as needed for muscle spasms.  S/P right colectomy -     Ambulatory referral to Gastroenterology  Slow transit constipation -     Ambulatory referral to Gastroenterology -     linaclotide (LINZESS) 145 MCG CAPS capsule; Take 1 capsule (145 mcg total) by mouth daily before breakfast.  Abdominal bloating  Encounter for immunization -     Flu Vaccine QUAD 36+ mos IM   I am having Mr. Rammel start on Diclofenac Sodium and methocarbamol. I am also having him maintain his aspirin EC, Multiple Vitamins-Minerals (MULTIVITAMIN PO), buPROPion, Pitavastatin Calcium, dexlansoprazole, varenicline, varenicline, silodosin, dicyclomine, polyethylene glycol powder, docusate sodium, and linaclotide. We administered ketorolac.  Meds ordered this encounter  Medications  . ketorolac (TORADOL) 30 MG/ML injection 30 mg  . Diclofenac Sodium (PENNSAID) 2 % SOLN    Sig: Place 1 application onto the skin 2 (two) times daily.    Dispense:  2 g    Refill:  1    Order Specific Question:   Supervising Provider    Answer:   Cassandria Anger [[id number]]  . linaclotide (LINZESS) 145 MCG CAPS capsule    Sig: Take 1 capsule (145 mcg total) by mouth daily before breakfast.    Dispense:  90 capsule    Refill:  3    Order Specific Question:   Supervising Provider    Answer:   Cassandria Anger [[id number]]  . methocarbamol (ROBAXIN) 500 MG tablet    Sig: Take 1 tablet (500 mg total) by mouth every 8 (eight) hours as needed  for muscle spasms.    Dispense:  21 tablet    Refill:  0    Order Specific Question:   Supervising Provider    Answer:   Cassandria Anger [[id number]]    Follow-up: No Follow-up on file.  Wilfred Lacy, NP

## 2016-11-15 ENCOUNTER — Encounter: Payer: Self-pay | Admitting: Internal Medicine

## 2016-12-16 ENCOUNTER — Ambulatory Visit (AMBULATORY_SURGERY_CENTER): Payer: Self-pay | Admitting: *Deleted

## 2016-12-16 VITALS — Ht 72.0 in | Wt 235.0 lb

## 2016-12-16 DIAGNOSIS — Z86010 Personal history of colonic polyps: Secondary | ICD-10-CM

## 2016-12-16 MED ORDER — NA SULFATE-K SULFATE-MG SULF 17.5-3.13-1.6 GM/177ML PO SOLN: ORAL | 0 refills | Status: DC

## 2016-12-16 NOTE — Progress Notes (Signed)
Patient denies any allergies to eggs or soy. Patient denies any problems with anesthesia/sedation. Patient denies any oxygen use at home and does not take any diet/weight loss medications.  

## 2016-12-19 ENCOUNTER — Encounter: Payer: Self-pay | Admitting: Internal Medicine

## 2017-01-02 ENCOUNTER — Telehealth: Payer: Self-pay | Admitting: *Deleted

## 2017-01-02 ENCOUNTER — Encounter: Payer: Self-pay | Admitting: Internal Medicine

## 2017-01-02 ENCOUNTER — Ambulatory Visit (AMBULATORY_SURGERY_CENTER): Payer: BLUE CROSS/BLUE SHIELD | Admitting: Internal Medicine

## 2017-01-02 VITALS — BP 112/64 | HR 60 | Temp 98.7°F | Resp 12 | Ht 72.0 in | Wt 235.0 lb

## 2017-01-02 DIAGNOSIS — D122 Benign neoplasm of ascending colon: Secondary | ICD-10-CM | POA: Diagnosis not present

## 2017-01-02 DIAGNOSIS — K635 Polyp of colon: Secondary | ICD-10-CM | POA: Diagnosis not present

## 2017-01-02 DIAGNOSIS — Z86010 Personal history of colonic polyps: Secondary | ICD-10-CM

## 2017-01-02 LAB — HM COLONOSCOPY

## 2017-01-02 MED ORDER — LINACLOTIDE 72 MCG PO CAPS: 72.0000 ug | ORAL_CAPSULE | Freq: Every day | ORAL | 5 refills | Status: DC

## 2017-01-02 MED ORDER — SODIUM CHLORIDE 0.9 % IV SOLN: 500.0000 mL | INTRAVENOUS | Status: DC

## 2017-01-02 NOTE — Patient Instructions (Signed)
YOU HAD AN ENDOSCOPIC PROCEDURE TODAY AT [facility]:   Refer to the procedure report that was given to you for any specific questions about what was found during the examination.  If the procedure report does not answer your questions, please call your gastroenterologist to clarify.  If you requested that your care partner not be given the details of your procedure findings, then the procedure report has been included in a sealed envelope for you to review at your convenience later.  YOU SHOULD EXPECT: Some feelings of bloating in the abdomen. Passage of more gas than usual.  Walking can help get rid of the air that was put into your GI tract during the procedure and reduce the bloating. If you had a lower endoscopy (such as a colonoscopy or flexible sigmoidoscopy) you may notice spotting of blood in your stool or on the toilet paper. If you underwent a bowel prep for your procedure, you may not have a normal bowel movement for a few days.  Please Note:  You might notice some irritation and congestion in your nose or some drainage.  This is from the oxygen used during your procedure.  There is no need for concern and it should clear up in a day or so.  SYMPTOMS TO REPORT IMMEDIATELY:   Following lower endoscopy (colonoscopy or flexible sigmoidoscopy):  Excessive amounts of blood in the stool  Significant tenderness or worsening of abdominal pains  Swelling of the abdomen that is new, acute  Fever of 100F or higher  For urgent or emergent issues, a gastroenterologist can be reached at any hour by calling [phone].   DIET:  We do recommend a small meal at first, but then you may proceed to your regular diet.  Drink plenty of fluids but you should avoid alcoholic beverages for 24 hours.  MEDS: Continue present medications including Miralax 17G daily, but decrease Linzess to 72 mcg daily (use this medication daily rather than as needed to improve constipation, abdominal  bloating and pain).  Office will call to schedule follow-up appointment to discuss constipation and medical treatment of constipation.  ACTIVITY:  You should plan to take it easy for the rest of today and you should NOT DRIVE or use heavy machinery until tomorrow (because of the sedation medicines used during the test).    FOLLOW UP: Our staff will call the number listed on your records the next business day following your procedure to check on you and address any questions or concerns that you may have regarding the information given to you following your procedure. If we do not reach you, we will leave a message.  However, if you are feeling well and you are not experiencing any problems, there is no need to return our call.  We will assume that you have returned to your regular daily activities without incident.  If any biopsies were taken you will be contacted by phone or by letter within the next 1-3 weeks.  Please call us at [phone] if you have not heard about the biopsies in 3 weeks.   Thank you for allowing [place] to take care of your healthcare needs today.  SIGNATURES/CONFIDENTIALITY: You and/or your care partner have signed paperwork which will be entered into your electronic medical record.  These signatures attest to the fact that that the information above on your After Visit Summary has been reviewed and is understood.  Full responsibility of the confidentiality of this discharge information lies with you  and/or your care-partner. 

## 2017-01-02 NOTE — Progress Notes (Signed)
Called to room to assist during endoscopic procedure.  Patient ID and intended procedure confirmed with present staff. Received instructions for my participation in the procedure from the performing physician.  

## 2017-01-02 NOTE — Op Note (Signed)
[place] Patient Name: [name] Procedure Date: 01/02/2017 1:29 PM MRN: [id number] Endoscopist: Jerene Bears , MD Age: 56 Referring MD:  Date of Birth: 08/27/1961 Gender: Male Account #: [id number] Procedure:                Colonoscopy Indications:              High risk colon cancer surveillance: Personal                            history of colonic polyps, Last colonoscopy:                            February 2016, history of benign, inflammatory                            cecal mass associated with diverticulosis with                            ileocecetomy 2016; constipation Medicines:                Monitored Anesthesia Care Procedure:                Pre-Anesthesia Assessment:                           - Prior to the procedure, a History and Physical                            was performed, and patient medications and                            allergies were reviewed. The patient's tolerance of                            previous anesthesia was also reviewed. The risks                            and benefits of the procedure and the sedation                            options and risks were discussed with the patient.                            All questions were answered, and informed consent                            was obtained. Prior Anticoagulants: The patient has                            taken no previous anticoagulant or antiplatelet                            agents. ASA Grade Assessment: II - A patient with  mild systemic disease. After reviewing the risks                            and benefits, the patient was deemed in                            satisfactory condition to undergo the procedure.                           After obtaining informed consent, the colonoscope                            was passed under direct vision. Throughout the                            procedure, the patient's blood pressure, pulse, and                           oxygen saturations were monitored continuously. The                            Model CF-HQ190L [id number]) scope was introduced                            through the anus and advanced to the the                            ileocolonic anastomosis. The colonoscopy was                            performed without difficulty. The patient tolerated                            the procedure well. The quality of the bowel                            preparation was good. The ileocecal valve,                            appendiceal orifice, and rectum were photographed. Scope In: 1:38:22 PM Scope Out: 1:53:36 PM Scope Withdrawal Time: 0 hours 11 minutes 9 seconds  Total Procedure Duration: 0 hours 15 minutes 14 seconds  Findings:                 The digital rectal exam was normal.                           The neo-terminal ileum appeared normal.                           There was evidence of a prior end-to-side                            ileo-colonic anastomosis in the ascending colon.  This was patent and was characterized by healthy                            appearing mucosa.                           A 5 mm polyp was found in the ascending colon. The                            polyp was sessile. The polyp was removed with a                            cold snare. Resection and retrieval were complete.                           A 3 mm polyp was found in the rectum. The polyp was                            sessile. The polyp was removed with a cold snare.                            Resection was complete, but the polyp tissue was                            not retrieved.                           Multiple small and large-mouthed diverticula were                            found in the sigmoid colon, descending colon and                            ascending colon.                           The retroflexed view of the distal rectum and anal                             verge was normal and showed no anal or rectal                            abnormalities. Complications:            No immediate complications. Estimated Blood Loss:     Estimated blood loss was minimal. Impression:               - The examined portion of the ileum was normal.                           - Patent end-to-side ileo-colonic anastomosis,                            characterized by healthy appearing mucosa.                           -  One 5 mm polyp in the ascending colon, removed                            with a cold snare. Resected and retrieved.                           - One 3 mm polyp in the rectum, removed with a cold                            snare. Complete resection. Polyp tissue not                            retrieved.                           - Mild diverticulosis in the sigmoid colon, in the                            descending colon and in the ascending colon.                           - The distal rectum and anal verge are normal on                            retroflexion view. Recommendation:           - Patient has a contact number available for                            emergencies. The signs and symptoms of potential                            delayed complications were discussed with the                            patient. Return to normal activities tomorrow.                            Written discharge instructions were provided to the                            patient.                           - Resume previous diet.                           - Continue present medications. Continue MiraLax                            17g daily. Decrease Linzess to 72 mcg daily (use                            this medication daily rather than as needed to  improve constipation, abdominal bloating and pain).                           - Await pathology results.                           - Repeat colonoscopy is recommended  for                            surveillance. The colonoscopy date will be                            determined after pathology results from today's                            exam become available for review.                           - Office follow-up next available to discuss                            constipation and medical treatment of constipation. Jerene Bears, MD 01/02/2017 1:59:56 PM This report has been signed electronically.

## 2017-01-02 NOTE — Progress Notes (Signed)
Report given to PACU, vss 

## 2017-01-03 ENCOUNTER — Telehealth: Payer: Self-pay

## 2017-01-03 ENCOUNTER — Telehealth: Payer: Self-pay | Admitting: *Deleted

## 2017-01-03 ENCOUNTER — Encounter: Payer: Self-pay | Admitting: *Deleted

## 2017-01-03 MED ORDER — LUBIPROSTONE 8 MCG PO CAPS: ORAL_CAPSULE | ORAL | 3 refills | Status: DC

## 2017-01-03 NOTE — Telephone Encounter (Signed)
-----   Message from Jerene Bears, MD sent at 01/03/2017  8:48 AM EST ----- Regarding: RE: Medication change request Will involve Dottie to help Can try Amitiza 8 mcg BID in place of Linzess He should call if not helping as dose can be increased JMP  ----- Message ----- From: Wendie Chess, RN Sent: 01/03/2017   8:16 AM To: Jerene Bears, MD Subject: Medication change request                      Made post-procedure follow-up call to patient this morning. Patient states he filled his Linzess and, although his insurance covered the majority of the cost, it was still expensive. Patient would like to know if there is an alternative medication that could be prescribed that is less expensive.  Jani Gravel, RN

## 2017-01-03 NOTE — Telephone Encounter (Signed)
  Follow up Call-  Call back number 01/02/2017 01/26/2015  Post procedure Call Back phone  # [phone]  Permission to leave phone message Yes Yes  Some recent data might be hidden     Patient questions:  Do you have a fever, pain , or abdominal swelling? No. Pain Score  0 *  Have you tolerated food without any problems? Yes.    Have you been able to return to your normal activities? Yes.    Do you have any questions about your discharge instructions: Diet   No. Medications  Yes.   Follow up visit  No.  Do you have questions or concerns about your Care? Yes.  Patient states that he filled his Linzess and, although his insurance paid the majority of the cost, it was still expensive. He asked if there is an alternative medication that is less expensive that could be prescribed. Will send this message to Dr. Hilarie Fredrickson.  Actions: * If pain score is 4 or above: No action needed, pain <4.

## 2017-01-03 NOTE — Telephone Encounter (Signed)
Rx and savings card information has been sent to pharmacy for [facility]. I have attempted to call patient to advise but have been unable to reach him. Instead I have sent him a mychart message since he is active on mychart.

## 2017-01-12 ENCOUNTER — Encounter: Payer: Self-pay | Admitting: Internal Medicine

## 2017-01-12 NOTE — Telephone Encounter (Signed)
Opened in error. SM

## 2017-01-13 LAB — HM COLONOSCOPY

## 2017-02-15 ENCOUNTER — Encounter: Payer: Self-pay | Admitting: Internal Medicine

## 2017-02-15 ENCOUNTER — Ambulatory Visit (INDEPENDENT_AMBULATORY_CARE_PROVIDER_SITE_OTHER): Payer: BLUE CROSS/BLUE SHIELD | Admitting: Internal Medicine

## 2017-02-15 VITALS — BP 110/76 | HR 76 | Ht 72.0 in | Wt 228.8 lb

## 2017-02-15 DIAGNOSIS — K5909 Other constipation: Secondary | ICD-10-CM

## 2017-02-15 DIAGNOSIS — R140 Abdominal distension (gaseous): Secondary | ICD-10-CM

## 2017-02-15 NOTE — Progress Notes (Signed)
Subjective:    Patient ID: [name], male    DOB: [birth year], 56 y.o.   MRN: [id number]  HPI [name] is a 56 year old male with a past medical history of diverticular disease with inflammatory cecal mass status post ileocecectomy for benign disease in April 2016, chronic constipation who is here for follow-up after colonoscopy. He also has a history of CAD, BPH, sleep apnea and hypercholesterolemia.  He came for colonoscopy on 01/02/2017. This was to follow-up the inflammatory mass which lead to ileocecectomy in 2016. Also with a history of colon polyps. Colonoscopy revealed a normal neoterminal ileum and ileocolonic anastomosis in the ascending colon. 23-5 mm polyps were removed which were benign and not adenomatous. There are multiple small and large mouth diverticula in the ascending, descending and sigmoid colon.  He has continued to have chronic constipation with hard stools, infrequent stools associated with abdominal bloating which persisted even after his surgery in 2016. He tried Linzess but this caused a "blowout" and stools that were urgent and took several trips to the bathroom to complete. 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 After colonoscopy reduce Linzess to 72 g daily from 145 g daily and this caused the same reaction. We then switched to Amitiza 8 g twice a day but he only use this once per day. With once daily dosing he saw no benefit. He tried 2 tablets at the same time and again got urgent fairly explosive stools. He has used MiraLAX and magnesium citrate off and on and he recently started Metamucil. He does report bloating but admits to drinking 1-2 L of Coke 0 sugar daily. No blood in his stool or melena. He denies upper GI complaint and hepatobiliary complaint.  Review of Systems As per history of present illness, otherwise negative  Current Medications, Allergies, Past Medical History, Past  Surgical History, Family History and Social History were reviewed in [employer] record.     Objective:   Physical Exam BP 110/76   Pulse 76   Ht 6' (1.829 m)   Wt 228 lb 12.8 oz (103.8 kg)   BMI 31.03 kg/m  Constitutional: Well-developed and well-nourished. No distress. HEENT: Normocephalic and atraumatic Conjunctivae are normal.  No scleral icterus. Neck: Neck supple. Trachea midline. Cardiovascular: Normal rate, regular rhythm and intact distal pulses.  Pulmonary/chest: Effort normal and breath sounds normal. No wheezing, rales or rhonchi. Abdominal: Soft, nontender, nondistended. Bowel sounds active throughout. There are no masses palpable. No hepatosplenomegaly. Well healed midline incision/scar Extremities: no clubbing, cyanosis, or edema Neurological: Alert and oriented to person place and time. Skin: Skin is warm and dry.  Psychiatric: Normal mood and affect. Behavior is normal.  CBC    Component Value Date/Time   WBC 5.7 11/05/2016 1647   RBC 4.70 11/05/2016 1647   HGB 13.1 11/05/2016 1647   HCT 38.5 (L) 11/05/2016 1647   PLT 235 11/05/2016 1647   MCV 81.9 11/05/2016 1647   MCH 27.9 11/05/2016 1647   MCHC 34.0 11/05/2016 1647   RDW 13.2 11/05/2016 1647   LYMPHSABS 1.5 11/05/2016 1647   MONOABS 0.6 11/05/2016 1647   EOSABS 0.2 11/05/2016 1647   BASOSABS 0.0 11/05/2016 1647   CMP     Component Value Date/Time   NA 139 11/05/2016 1647   K 3.9 11/05/2016 1647   CL 107 11/05/2016 1647   CO2 23 11/05/2016 1647   GLUCOSE 96 11/05/2016 1647  BUN 14 11/05/2016 1647   CREATININE 1.04 11/05/2016 1647   CALCIUM 8.9 11/05/2016 1647   PROT 7.2 11/05/2016 1647   ALBUMIN 4.2 11/05/2016 1647   AST 19 11/05/2016 1647   ALT 20 11/05/2016 1647   ALKPHOS 80 11/05/2016 1647   BILITOT 0.6 11/05/2016 1647   GFRNONAA >60 11/05/2016 1647   GFRAA >60 11/05/2016 1647      Assessment & Plan:  56 year old male with a past medical history of diverticular  disease with inflammatory cecal mass status post ileocecectomy for benign disease in April 2016, chronic constipation who is here for follow-up after colonoscopy.   1. Chronic constipation and colonic diverticulosis -- we spent time today discussing medications for his chronic constipation. His response to Linzess is explosive loose stools which is not ideal. His work and sleep schedule complicates dosing of his laxatives. I encouraged that he focus on hydration and continue Metamucil 1 tablespoon per day. I feel that Amitiza remains the best option for him but he will need to take this on a regular basis for over a week to determine its efficacy. I think adding the second dose a part from the first dose at 8 g twice daily is the best dose now. Stop MiraLAX and mag citrate.  2. Bloating -- likely secondary to carbonate beverages. I advised that he stop carbonated beverages and see if bloating improves. I also expect bloating to improve with improvement constipation.  Return in 3-4 months, sooner if necessary 25 minutes spent with the patient today. Greater than 50% was spent in counseling and coordination of care with the patient

## 2017-02-15 NOTE — Patient Instructions (Signed)
We have sent the following medications to your pharmacy for you to pick up at your convenience: Amitiza 8 mg twice daily  Please purchase the following medications over the counter and take as directed: Metamucil 1 tablespoon daily  Discontinue miralax and linzess.  Please follow up with Dr Hilarie Fredrickson in 3 months.  If you are age 54 or older, your body mass index should be between 23-30. Your Body mass index is 31.03 kg/m. If this is out of the aforementioned range listed, please consider follow up with your Primary Care Provider.  If you are age 33 or younger, your body mass index should be between 19-25. Your Body mass index is 31.03 kg/m. If this is out of the aformentioned range listed, please consider follow up with your Primary Care Provider.

## 2017-04-18 ENCOUNTER — Encounter: Payer: Self-pay | Admitting: Nurse Practitioner

## 2017-04-18 ENCOUNTER — Ambulatory Visit (INDEPENDENT_AMBULATORY_CARE_PROVIDER_SITE_OTHER): Payer: BLUE CROSS/BLUE SHIELD | Admitting: Nurse Practitioner

## 2017-04-18 VITALS — BP 112/78 | HR 70 | Temp 98.7°F | Ht 72.0 in | Wt 232.0 lb

## 2017-04-18 DIAGNOSIS — S46812A Strain of other muscles, fascia and tendons at shoulder and upper arm level, left arm, initial encounter: Secondary | ICD-10-CM | POA: Diagnosis not present

## 2017-04-18 MED ORDER — CYCLOBENZAPRINE HCL 5 MG PO TABS: 5.0000 mg | ORAL_TABLET | Freq: Every day | ORAL | 0 refills | Status: DC

## 2017-04-18 MED ORDER — NAPROXEN-ESOMEPRAZOLE 500-20 MG PO TBEC: 1.0000 | DELAYED_RELEASE_TABLET | Freq: Two times a day (BID) | ORAL | 0 refills | Status: DC | PRN

## 2017-04-18 NOTE — Patient Instructions (Signed)
Alternate between warm and cold compress as needed  Neck Exercises Neck exercises can be important for many reasons:  They can help you to improve and maintain flexibility in your neck. This can be especially important as you age.  They can help to make your neck stronger. This can make movement easier.  They can reduce or prevent neck pain.  They may help your upper back. Ask your health care provider which neck exercises would be best for you. Exercises Neck Press  Repeat this exercise 10 times. Do it first thing in the morning and right before bed or as told by your health care provider. 1. Lie on your back on a firm bed or on the floor with a pillow under your head. 2. Use your neck muscles to push your head down on the pillow and straighten your spine. 3. Hold the position as well as you can. Keep your head facing up and your chin tucked. 4. Slowly count to 5 while holding this position. 5. Relax for a few seconds. Then repeat. Isometric Strengthening  Do a full set of these exercises 2 times a day or as told by your health care provider. 1. Sit in a supportive chair and place your hand on your forehead. 2. Push forward with your head and neck while pushing back with your hand. Hold for 10 seconds. 3. Relax. Then repeat the exercise 3 times. 4. Next, do thesequence again, this time putting your hand against the back of your head. Use your head and neck to push backward against the hand pressure. 5. Finally, do the same exercise on either side of your head, pushing sideways against the pressure of your hand. Prone Head Lifts  Repeat this exercise 5 times. Do this 2 times a day or as told by your health care provider. 1. Lie face-down, resting on your elbows so that your chest and upper back are raised. 2. Start with your head facing downward, near your chest. Position your chin either on or near your chest. 3. Slowly lift your head upward. Lift until you are looking straight  ahead. Then continue lifting your head as far back as you can stretch. 4. Hold your head up for 5 seconds. Then slowly lower it to your starting position. Supine Head Lifts  Repeat this exercise 8-10 times. Do this 2 times a day or as told by your health care provider. 1. Lie on your back, bending your knees to point to the ceiling and keeping your feet flat on the floor. 2. Lift your head slowly off the floor, raising your chin toward your chest. 3. Hold for 5 seconds. 4. Relax and repeat. Scapular Retraction  Repeat this exercise 5 times. Do this 2 times a day or as told by your health care provider. 1. Stand with your arms at your sides. Look straight ahead. 2. Slowly pull both shoulders backward and downward until you feel a stretch between your shoulder blades in your upper back. 3. Hold for 10-30 seconds. 4. Relax and repeat. Contact a health care provider if:  Your neck pain or discomfort gets much worse when you do an exercise.  Your neck pain or discomfort does not improve within 2 hours after you exercise. If you have any of these problems, stop exercising right away. Do not do the exercises again unless your health care provider says that you can. Get help right away if:  You develop sudden, severe neck pain. If this happens, stop exercising right away.  Do not do the exercises again unless your health care provider says that you can. Exercises Neck Stretch  Repeat this exercise 3-5 times. 1. Do this exercise while standing or while sitting in a chair. 2. Place your feet flat on the floor, shoulder-width apart. 3. Slowly turn your head to the right. Turn it all the way to the right so you can look over your right shoulder. Do not tilt or tip your head. 4. Hold this position for 10-30 seconds. 5. Slowly turn your head to the left, to look over your left shoulder. 6. Hold this position for 10-30 seconds. Neck Retraction Repeat this exercise 8-10 times. Do this 3-4 times a  day or as told by your health care provider. 1. Do this exercise while standing or while sitting in a sturdy chair. 2. Look straight ahead. Do not bend your neck. 3. Use your fingers to push your chin backward. Do not bend your neck for this movement. Continue to face straight ahead. If you are doing the exercise properly, you will feel a slight sensation in your throat and a stretch at the back of your neck. 4. Hold the stretch for 1-2 seconds. Relax and repeat. This information is not intended to replace advice given to you by your health care provider. Make sure you discuss any questions you have with your health care provider. Document Released: 10/26/2015 Document Revised: 04/21/2016 Document Reviewed: 05/25/2015 Elsevier Interactive Patient Education  2017 [employer].

## 2017-04-18 NOTE — Progress Notes (Signed)
Subjective:  Patient ID: [name], male    DOB: [birth year]  Age: 56 y.o. MRN: [id number]  CC: Neck Pain (cant turn neck,middle upper back pain for 9 days. )   Neck Pain   This is a new problem. The current episode started in the past 7 days. The problem occurs constantly. The problem has been unchanged. The pain is associated with a twisting injury and a sleep position. The pain is present in the left side. The quality of the pain is described as aching and cramping. The pain is at a severity of 5/10. The symptoms are aggravated by bending, twisting and position. The pain is worse during the night. Pertinent negatives include no chest pain, fever, headaches, leg pain, numbness, pain with swallowing, paresis, photophobia, syncope, trouble swallowing, visual change, weakness or weight loss. He has tried nothing for the symptoms.    Outpatient Medications Prior to Visit  Medication Sig Dispense Refill  . aspirin EC 81 MG tablet Take 81 mg by mouth daily.    Marland Kitchen lubiprostone (AMITIZA) 8 MCG capsule Take 1 cap bid with meals Savings Card [id number] [id number] [id number] Issuer:([id number]) [id number] 60 capsule 3  . Multiple Vitamins-Minerals (MULTIVITAMIN PO) Take 1 tablet by mouth daily.    Marland Kitchen linaclotide (LINZESS) 72 MCG capsule Take 1 capsule (72 mcg total) by mouth daily before breakfast. (Patient not taking: Reported on 04/18/2017) 30 capsule 5  . polyethylene glycol powder (GLYCOLAX/MIRALAX) powder Take 17 g by mouth 2 (two) times daily. Until daily soft stools  OTC (Patient not taking: Reported on 04/18/2017) 250 g 0   Facility-Administered Medications Prior to Visit  Medication Dose Route Frequency Provider Last Rate Last Dose  . 0.9 %  sodium chloride infusion  500 mL Intravenous Continuous Pyrtle, Lajuan Lines, MD        ROS See HPI  Objective:  BP 112/78   Pulse 70   Temp 98.7 F (37.1 C)   Ht 6' (1.829 m)   Wt 232 lb (105.2 kg)   SpO2 98%   BMI 31.46 kg/m   BP Readings from Last  3 Encounters:  04/18/17 112/78  02/15/17 110/76  01/02/17 112/64    Wt Readings from Last 3 Encounters:  04/18/17 232 lb (105.2 kg)  02/15/17 228 lb 12.8 oz (103.8 kg)  01/02/17 235 lb (106.6 kg)    Physical Exam  Constitutional: He is oriented to person, place, and time.  Neck: Normal range of motion.  Cardiovascular: Normal rate.   Pulmonary/Chest: No respiratory distress.  Musculoskeletal: He exhibits tenderness. He exhibits no edema.       Right shoulder: Normal.       Left shoulder: Normal.       Cervical back: Normal.  Left trapezius muscle spasm and tenderness.  Lymphadenopathy:    He has no cervical adenopathy.  Neurological: He is alert and oriented to person, place, and time.  Skin: Skin is warm and dry. No rash noted. No erythema.  Vitals reviewed.   Lab Results  Component Value Date   WBC 5.7 11/05/2016   HGB 13.1 11/05/2016   HCT 38.5 (L) 11/05/2016   PLT 235 11/05/2016   GLUCOSE 96 11/05/2016   CHOL 131 06/28/2016   TRIG 152.0 (H) 06/28/2016   HDL 42.70 06/28/2016   LDLDIRECT 132.4 07/29/2008   LDLCALC 57 06/28/2016   ALT 20 11/05/2016   AST 19 11/05/2016   NA 139 11/05/2016   K 3.9 11/05/2016   CL 107 11/05/2016  CREATININE 1.04 11/05/2016   BUN 14 11/05/2016   CO2 23 11/05/2016   TSH 1.08 06/28/2016   PSA 3.36 06/28/2016   HGBA1C 5.8 08/13/2015   MICROALBUR 1.2 03/22/2012    Dg Chest 2 View  Result Date: 11/05/2016 CLINICAL DATA:  Right upper quadrant pain for several days, shortness of Breath EXAM: CHEST  2 VIEW COMPARISON:  08/13/2015 FINDINGS: The heart size and mediastinal contours are within normal limits. Both lungs are clear. The visualized skeletal structures are unremarkable. IMPRESSION: No active cardiopulmonary disease. Electronically Signed   By: [name] M.D.   On: 11/05/2016 17:41   Ct Abdomen Pelvis W Contrast  Result Date: 11/05/2016 CLINICAL DATA:  Right upper quadrant pain radiating through to the back. Bloating for 1  week. Constipation. Fever and nausea. EXAM: CT ABDOMEN AND PELVIS WITH CONTRAST TECHNIQUE: Multidetector CT imaging of the abdomen and pelvis was performed using the standard protocol following bolus administration of intravenous contrast. CONTRAST:  139mL ISOVUE-300 IOPAMIDOL (ISOVUE-300) INJECTION 61% COMPARISON:  March 22, 2015 FINDINGS: Lower chest: No acute abnormality. Hepatobiliary: No focal liver abnormality is seen. No gallstones, gallbladder wall thickening, or biliary dilatation. Pancreas: Unremarkable. No pancreatic ductal dilatation or surrounding inflammatory changes. Spleen: Normal in size without focal abnormality. Adrenals/Urinary Tract: Adrenal glands are unremarkable. Kidneys are normal, without renal calculi, focal lesion, or hydronephrosis. Bladder is unremarkable. Stomach/Bowel: The stomach is normal in appearance. The small bowel obstruction seen on the previous study has resolved. Visualized small bowel is normal today. The patient is status post partial colonic resection with anastomosis in the right side of the abdomen. No colonic inflammation is identified today. Mild fecal loading in the colon. No obstruction identified. Vascular/Lymphatic: No significant vascular findings are present. No enlarged abdominal or pelvic lymph nodes. Reproductive: Prostate is unremarkable. Other: Fat containing ventral hernia. No other additional abnormalities. Musculoskeletal: No acute or significant osseous findings. IMPRESSION: 1. No acute abnormalities.  Mild fecal loading in the colon. Electronically Signed   By: [name] M.D   On: 11/05/2016 18:16    Assessment & Plan:   [name] was seen today for neck pain.  Diagnoses and all orders for this visit:  Strain of left trapezius muscle, initial encounter -     Naproxen-Esomeprazole (VIMOVO) 500-20 MG TBEC; Take 1 tablet by mouth 2 (two) times daily between meals as needed. -     cyclobenzaprine (FLEXERIL) 5 MG tablet; Take 1 tablet (5 mg  total) by mouth at bedtime.   I am having [name] start on Naproxen-Esomeprazole and cyclobenzaprine. I am also having him maintain his aspirin EC, Multiple Vitamins-Minerals (MULTIVITAMIN PO), polyethylene glycol powder, linaclotide, and lubiprostone. We will continue to administer sodium chloride.  Meds ordered this encounter  Medications  . Naproxen-Esomeprazole (VIMOVO) 500-20 MG TBEC    Sig: Take 1 tablet by mouth 2 (two) times daily between meals as needed.    Dispense:  30 tablet    Refill:  0    Order Specific Question:   Supervising Provider    Answer:   Cassandria Anger [[id number]]  . cyclobenzaprine (FLEXERIL) 5 MG tablet    Sig: Take 1 tablet (5 mg total) by mouth at bedtime.    Dispense:  20 tablet    Refill:  0    Order Specific Question:   Supervising Provider    Answer:   Cassandria Anger [[id number]]    Follow-up: Return if symptoms worsen or fail to improve.  Wilfred Lacy, NP

## 2017-06-29 ENCOUNTER — Ambulatory Visit (INDEPENDENT_AMBULATORY_CARE_PROVIDER_SITE_OTHER): Payer: BLUE CROSS/BLUE SHIELD | Admitting: Internal Medicine

## 2017-06-29 ENCOUNTER — Other Ambulatory Visit (INDEPENDENT_AMBULATORY_CARE_PROVIDER_SITE_OTHER): Payer: BLUE CROSS/BLUE SHIELD

## 2017-06-29 ENCOUNTER — Encounter: Payer: Self-pay | Admitting: Internal Medicine

## 2017-06-29 VITALS — BP 120/70 | HR 74 | Temp 97.4°F | Resp 16 | Ht 72.0 in | Wt 231.0 lb

## 2017-06-29 DIAGNOSIS — N401 Enlarged prostate with lower urinary tract symptoms: Secondary | ICD-10-CM | POA: Diagnosis not present

## 2017-06-29 DIAGNOSIS — K5901 Slow transit constipation: Secondary | ICD-10-CM | POA: Diagnosis not present

## 2017-06-29 DIAGNOSIS — E785 Hyperlipidemia, unspecified: Secondary | ICD-10-CM

## 2017-06-29 DIAGNOSIS — Z0000 Encounter for general adult medical examination without abnormal findings: Secondary | ICD-10-CM | POA: Diagnosis not present

## 2017-06-29 DIAGNOSIS — L309 Dermatitis, unspecified: Secondary | ICD-10-CM

## 2017-06-29 DIAGNOSIS — R21 Rash and other nonspecific skin eruption: Secondary | ICD-10-CM

## 2017-06-29 DIAGNOSIS — R351 Nocturia: Secondary | ICD-10-CM | POA: Diagnosis not present

## 2017-06-29 DIAGNOSIS — I2510 Atherosclerotic heart disease of native coronary artery without angina pectoris: Secondary | ICD-10-CM | POA: Diagnosis not present

## 2017-06-29 LAB — COMPREHENSIVE METABOLIC PANEL
ALBUMIN: 4.3 g/dL (ref 3.5–5.2)
ALT: 16 U/L (ref 0–53)
AST: 19 U/L (ref 0–37)
Alkaline Phosphatase: 76 U/L (ref 39–117)
BUN: 14 mg/dL (ref 6–23)
CALCIUM: 8.8 mg/dL (ref 8.4–10.5)
CHLORIDE: 107 meq/L (ref 96–112)
CO2: 27 meq/L (ref 19–32)
CREATININE: 1.2 mg/dL (ref 0.40–1.50)
GFR: 81.14 mL/min (ref >60.00)
Glucose, Bld: 81 mg/dL (ref 70–99)
POTASSIUM: 4.1 meq/L (ref 3.5–5.1)
SODIUM: 141 meq/L (ref 135–145)
Total Bilirubin: 0.5 mg/dL (ref 0.2–1.2)
Total Protein: 6.9 g/dL (ref 6.0–8.3)

## 2017-06-29 LAB — URINALYSIS, ROUTINE W REFLEX MICROSCOPIC
Bilirubin Urine: NEGATIVE
Hgb urine dipstick: NEGATIVE
KETONES UR: NEGATIVE
Leukocytes, UA: NEGATIVE
Nitrite: NEGATIVE
PH: 6 (ref 5.0–8.0)
TOTAL PROTEIN, URINE-UPE24: NEGATIVE
URINE GLUCOSE: NEGATIVE
Urobilinogen, UA: 0.2 (ref 0.0–1.0)

## 2017-06-29 LAB — CBC WITH DIFFERENTIAL/PLATELET
BASOS ABS: 0.1 10*3/uL (ref 0.0–0.1)
Basophils Relative: 1.5 % (ref 0.0–3.0)
EOS ABS: 0.2 10*3/uL (ref 0.0–0.7)
Eosinophils Relative: 3.7 % (ref 0.0–5.0)
HCT: 42 % (ref 39.0–52.0)
Hemoglobin: 14.1 g/dL (ref 13.0–17.0)
LYMPHS ABS: 1.3 10*3/uL (ref 0.7–4.0)
LYMPHS PCT: 25.2 % (ref 12.0–46.0)
MCHC: 33.6 g/dL (ref 30.0–36.0)
MCV: 84.6 fl (ref 78.0–100.0)
MONOS PCT: 11.8 % (ref 3.0–12.0)
Monocytes Absolute: 0.6 10*3/uL (ref 0.1–1.0)
NEUTROS ABS: 2.9 10*3/uL (ref 1.4–7.7)
NEUTROS PCT: 57.8 % (ref 43.0–77.0)
PLATELETS: 236 10*3/uL (ref 150.0–400.0)
RBC: 4.96 Mil/uL (ref 4.22–5.81)
RDW: 13.8 % (ref 11.5–15.5)
WBC: 5.1 10*3/uL (ref 4.0–10.5)

## 2017-06-29 LAB — LIPID PANEL
CHOL/HDL RATIO: 3
Cholesterol: 152 mg/dL (ref 0–200)
HDL: 51.5 mg/dL (ref >39.00)
LDL Cholesterol: 90 mg/dL (ref 0–99)
NONHDL: 100.84
Triglycerides: 55 mg/dL (ref 0.0–149.0)
VLDL: 11 mg/dL (ref 0.0–40.0)

## 2017-06-29 LAB — PSA: PSA: 2.41 ng/mL (ref 0.10–4.00)

## 2017-06-29 LAB — SEDIMENTATION RATE: Sed Rate: 9 mm/hr (ref 0–20)

## 2017-06-29 MED ORDER — ATORVASTATIN CALCIUM 20 MG PO TABS: 20.0000 mg | ORAL_TABLET | Freq: Every day | ORAL | 3 refills | Status: DC

## 2017-06-29 MED ORDER — FINASTERIDE 5 MG PO TABS: 5.0000 mg | ORAL_TABLET | Freq: Every day | ORAL | 1 refills | Status: DC

## 2017-06-29 MED ORDER — SILODOSIN 8 MG PO CAPS: 8.0000 mg | ORAL_CAPSULE | Freq: Every day | ORAL | 1 refills | Status: DC

## 2017-06-29 MED ORDER — LINACLOTIDE 145 MCG PO CAPS: 145.0000 ug | ORAL_CAPSULE | Freq: Every day | ORAL | 1 refills | Status: DC

## 2017-06-29 MED ORDER — CRISABOROLE 2 % EX OINT: 1.0000 | TOPICAL_OINTMENT | Freq: Two times a day (BID) | CUTANEOUS | 2 refills | Status: DC

## 2017-06-29 NOTE — Progress Notes (Signed)
Subjective:  Patient ID: [name], male    DOB: [birth year]  Age: 56 y.o. MRN: [id number]  CC: Annual Exam; Rash; and Hyperlipidemia   HPI [name] presents for a CPX.   He complains of itchy spots on his torso and extremities for several weeks. He does not take a statin because he thinks they are too expensive.  Outpatient Medications Prior to Visit  Medication Sig Dispense Refill  . aspirin EC 81 MG tablet Take 81 mg by mouth daily.    . cyclobenzaprine (FLEXERIL) 5 MG tablet Take 1 tablet (5 mg total) by mouth at bedtime. 20 tablet 0  . lubiprostone (AMITIZA) 8 MCG capsule Take 1 cap bid with meals Savings Card [id number] PCN:[id number] Issuer:([id number]) [id number] 60 capsule 3  . polyethylene glycol powder (GLYCOLAX/MIRALAX) powder Take 17 g by mouth 2 (two) times daily. Until daily soft stools  OTC 250 g 0  . linaclotide (LINZESS) 72 MCG capsule Take 1 capsule (72 mcg total) by mouth daily before breakfast. (Patient not taking: Reported on 04/18/2017) 30 capsule 5  . Multiple Vitamins-Minerals (MULTIVITAMIN PO) Take 1 tablet by mouth daily.    . Naproxen-Esomeprazole (VIMOVO) 500-20 MG TBEC Take 1 tablet by mouth 2 (two) times daily between meals as needed. 30 tablet 0  . 0.9 %  sodium chloride infusion      No facility-administered medications prior to visit.     ROS Review of Systems  Constitutional: Negative.  Negative for appetite change, chills, diaphoresis, fatigue and fever.  HENT: Negative for sinus pressure and trouble swallowing.   Eyes: Negative for visual disturbance.  Respiratory: Negative.  Negative for apnea, cough, chest tightness, shortness of breath and wheezing.   Cardiovascular: Negative for chest pain, palpitations and leg swelling.  Gastrointestinal: Positive for constipation. Negative for abdominal pain, blood in stool, diarrhea, nausea and vomiting.  Endocrine: Negative.   Genitourinary: Positive for difficulty urinating. Negative for  dysuria, flank pain, frequency, hematuria, penile swelling, scrotal swelling and testicular pain.       He complains of nocturia, waking up every 2-3 hours to urinate, he complains of weak urine stream with dribbling and straining  Musculoskeletal: Negative.  Negative for arthralgias, back pain, myalgias and neck pain.  Skin: Positive for rash. Negative for color change and pallor.  Allergic/Immunologic: Negative.   Neurological: Negative.  Negative for dizziness, weakness and headaches.  Hematological: Negative for adenopathy. Does not bruise/bleed easily.  Psychiatric/Behavioral: Negative.     Objective:  BP 120/70 (BP Location: Left Arm, Patient Position: Sitting, Cuff Size: Large)   Pulse 74   Temp (!) 97.4 F (36.3 C) (Oral)   Resp 16   Ht 6' (1.829 m)   Wt 231 lb (104.8 kg)   SpO2 98%   BMI 31.33 kg/m   BP Readings from Last 3 Encounters:  06/29/17 120/70  04/18/17 112/78  02/15/17 110/76    Wt Readings from Last 3 Encounters:  06/29/17 231 lb (104.8 kg)  04/18/17 232 lb (105.2 kg)  02/15/17 228 lb 12.8 oz (103.8 kg)    Physical Exam  Constitutional: He is oriented to person, place, and time. No distress.  HENT:  Mouth/Throat: Oropharynx is clear and moist. No oropharyngeal exudate.  Eyes: Conjunctivae are normal. Right eye exhibits no discharge. Left eye exhibits no discharge. No scleral icterus.  Neck: Normal range of motion. Neck supple. No JVD present. No tracheal deviation present. No thyromegaly present.  Cardiovascular: Normal rate, regular rhythm and intact distal  pulses.  Exam reveals no gallop and no friction rub.   No murmur heard. Pulmonary/Chest: Effort normal and breath sounds normal. No respiratory distress. He has no wheezes. He has no rales. He exhibits no tenderness.  Abdominal: Soft. Bowel sounds are normal. He exhibits no distension and no mass. There is no tenderness. There is no rebound and no guarding. Hernia confirmed negative in the right  inguinal area and confirmed negative in the left inguinal area.  Genitourinary: Testes normal and penis normal. Rectal exam shows no external hemorrhoid, no internal hemorrhoid, no fissure, no mass, no tenderness, anal tone normal and guaiac negative stool. Prostate is enlarged (3+ smooth symm BPH). Prostate is not tender. Right testis shows no mass, no swelling and no tenderness. Right testis is descended. Left testis shows no mass, no swelling and no tenderness. Left testis is descended. Circumcised. No penile erythema or penile tenderness. No discharge found.  Musculoskeletal: Normal range of motion. He exhibits no edema, tenderness or deformity.  Lymphadenopathy:    He has no cervical adenopathy.       Right: No inguinal adenopathy present.       Left: No inguinal adenopathy present.  Neurological: He is alert and oriented to person, place, and time.  Skin: Skin is warm and dry. Rash noted. He is not diaphoretic. No erythema. No pallor.  There are hyperpigmented macules on the palms and soles. They measure about 4-5 mm each. Over the torso, extremities, and feet as well as the dorsum of the fingers there are too numerous to count fleshy papules with faint scale.  Vitals reviewed.   Lab Results  Component Value Date   WBC 5.1 06/29/2017   HGB 14.1 06/29/2017   HCT 42.0 06/29/2017   PLT 236.0 06/29/2017   GLUCOSE 81 06/29/2017   CHOL 152 06/29/2017   TRIG 55.0 06/29/2017   HDL 51.50 06/29/2017   LDLDIRECT 132.4 07/29/2008   LDLCALC 90 06/29/2017   ALT 16 06/29/2017   AST 19 06/29/2017   NA 141 06/29/2017   K 4.1 06/29/2017   CL 107 06/29/2017   CREATININE 1.20 06/29/2017   BUN 14 06/29/2017   CO2 27 06/29/2017   TSH 0.92 06/29/2017   PSA 2.41 06/29/2017   HGBA1C 5.8 08/13/2015   MICROALBUR 1.2 03/22/2012    Dg Chest 2 View  Result Date: 11/05/2016 CLINICAL DATA:  Right upper quadrant pain for several days, shortness of Breath EXAM: CHEST  2 VIEW COMPARISON:  08/13/2015  FINDINGS: The heart size and mediastinal contours are within normal limits. Both lungs are clear. The visualized skeletal structures are unremarkable. IMPRESSION: No active cardiopulmonary disease. Electronically Signed   By: Inez Catalina M.D.   On: 11/05/2016 17:41   Ct Abdomen Pelvis W Contrast  Result Date: 11/05/2016 CLINICAL DATA:  Right upper quadrant pain radiating through to the back. Bloating for 1 week. Constipation. Fever and nausea. EXAM: CT ABDOMEN AND PELVIS WITH CONTRAST TECHNIQUE: Multidetector CT imaging of the abdomen and pelvis was performed using the standard protocol following bolus administration of intravenous contrast. CONTRAST:  128mL ISOVUE-300 IOPAMIDOL (ISOVUE-300) INJECTION 61% COMPARISON:  March 22, 2015 FINDINGS: Lower chest: No acute abnormality. Hepatobiliary: No focal liver abnormality is seen. No gallstones, gallbladder wall thickening, or biliary dilatation. Pancreas: Unremarkable. No pancreatic ductal dilatation or surrounding inflammatory changes. Spleen: Normal in size without focal abnormality. Adrenals/Urinary Tract: Adrenal glands are unremarkable. Kidneys are normal, without renal calculi, focal lesion, or hydronephrosis. Bladder is unremarkable. Stomach/Bowel: The stomach is normal  in appearance. The small bowel obstruction seen on the previous study has resolved. Visualized small bowel is normal today. The patient is status post partial colonic resection with anastomosis in the right side of the abdomen. No colonic inflammation is identified today. Mild fecal loading in the colon. No obstruction identified. Vascular/Lymphatic: No significant vascular findings are present. No enlarged abdominal or pelvic lymph nodes. Reproductive: Prostate is unremarkable. Other: Fat containing ventral hernia. No other additional abnormalities. Musculoskeletal: No acute or significant osseous findings. IMPRESSION: 1. No acute abnormalities.  Mild fecal loading in the colon.  Electronically Signed   By: Dorise Bullion III M.D   On: 11/05/2016 18:16    Assessment & Plan:   Trayvond was seen today for annual exam, rash and hyperlipidemia.  Diagnoses and all orders for this visit:  Atherosclerosis of native coronary artery of native heart without angina pectoris- he said no recent symptoms suspicious of angina. Will continue risk factor modification with statin and aspirin. -     atorvastatin (LIPITOR) 20 MG tablet; Take 1 tablet (20 mg total) by mouth daily. -     aspirin EC 81 MG tablet; Take 1 tablet (81 mg total) by mouth daily.  Benign prostatic hyperplasia with nocturia- his PSA has not risen over the last year so I'm not concerned about prostate cancer. He does have worsening symptoms so I have asked him to start taking an alpha blocker as well as finasteride. -     Urinalysis, Routine w reflex microscopic; Future -     silodosin (RAPAFLO) 8 MG CAPS capsule; Take 1 capsule (8 mg total) by mouth daily with breakfast. -     finasteride (PROSCAR) 5 MG tablet; Take 1 tablet (5 mg total) by mouth daily.  Hyperlipidemia with target LDL less than 70- he has not achieved his LDL goal so I have asked him to start taking a statin. -     atorvastatin (LIPITOR) 20 MG tablet; Take 1 tablet (20 mg total) by mouth daily.  Routine general medical examination at a health care facility- exam completed, labs reviewed, vaccines reviewed, colon cancer screening is up-to-date. Patient education material was given. -     Lipid panel; Future -     PSA; Future  Rash- his lab work is normal indicating no concern for lymphoproliferative disease, syphilis, vasculitis, or HIV. Will treat for eczema. -     CBC with Differential/Platelet; Future -     Sedimentation rate; Future -     RPR; Future -     HIV antibody (with reflex)  Eczema, unspecified type -     Crisaborole (EUCRISA) 2 % OINT; Apply 1 Act topically 2 (two) times daily.  Slow transit constipation- his labs are negative  for any secondary or metabolic causes. He has not responded well to Amitiza so will try Linzess to control his symptoms. -     linaclotide (LINZESS) 145 MCG CAPS capsule; Take 1 capsule (145 mcg total) by mouth daily before breakfast. -     Comprehensive metabolic panel; Future -     Thyroid Panel With TSH; Future   I have discontinued Mr. Battey's Multiple Vitamins-Minerals (MULTIVITAMIN PO), polyethylene glycol powder, linaclotide, lubiprostone, Naproxen-Esomeprazole, and cyclobenzaprine. I have also changed his aspirin EC. Additionally, I am having him start on Crisaborole, silodosin, finasteride, linaclotide, and atorvastatin. We will stop administering sodium chloride.  Meds ordered this encounter  Medications  . Crisaborole (EUCRISA) 2 % OINT    Sig: Apply 1 Act topically 2 (  two) times daily.    Dispense:  60 g    Refill:  2  . silodosin (RAPAFLO) 8 MG CAPS capsule    Sig: Take 1 capsule (8 mg total) by mouth daily with breakfast.    Dispense:  90 capsule    Refill:  1  . finasteride (PROSCAR) 5 MG tablet    Sig: Take 1 tablet (5 mg total) by mouth daily.    Dispense:  90 tablet    Refill:  1  . linaclotide (LINZESS) 145 MCG CAPS capsule    Sig: Take 1 capsule (145 mcg total) by mouth daily before breakfast.    Dispense:  90 capsule    Refill:  1  . atorvastatin (LIPITOR) 20 MG tablet    Sig: Take 1 tablet (20 mg total) by mouth daily.    Dispense:  90 tablet    Refill:  3  . aspirin EC 81 MG tablet    Sig: Take 1 tablet (81 mg total) by mouth daily.    Dispense:  90 tablet    Refill:  3     Follow-up: Return in about 4 weeks (around 07/27/2017).  Scarlette Calico, MD

## 2017-06-29 NOTE — Patient Instructions (Signed)
Rash A rash is a change in the color of the skin. A rash can also change the way your skin feels. There are many different conditions and factors that can cause a rash. Follow these instructions at home: Pay attention to any changes in your symptoms. Follow these instructions to help with your condition: Medicine Take or apply over-the-counter and prescription medicines only as told by your health care provider. These may include:  Corticosteroid cream.  Anti-itch lotions.  Oral antihistamines.  Skin Care  Apply cool compresses to the affected areas.  Try taking a bath with: ? Epsom salts. Follow the instructions on the packaging. You can get these at your local pharmacy or grocery store. ? Baking soda. Pour a small amount into the bath as told by your health care provider. ? Colloidal oatmeal. Follow the instructions on the packaging. You can get this at your local pharmacy or grocery store.  Try applying baking soda paste to your skin. Stir water into baking soda until it reaches a paste-like consistency.  Do not scratch or rub your skin.  Avoid covering the rash. Make sure the rash is exposed to air as much as possible. General instructions  Avoid hot showers or baths, which can make itching worse. A cold shower may help.  Avoid scented soaps, detergents, and perfumes. Use gentle soaps, detergents, perfumes, and other cosmetic products.  Avoid any substance that causes your rash. Keep a journal to help track what causes your rash. Write down: ? What you eat. ? What cosmetic products you use. ? What you drink. ? What you wear. This includes jewelry.  Keep all follow-up visits as told by your health care provider. This is important. Contact a health care provider if:  You sweat at night.  You lose weight.  You urinate more than normal.  You feel weak.  You vomit.  Your skin or the whites of your eyes look yellow (jaundice).  Your skin: ? Tingles. ? Is  numb.  Your rash: ? Does not go away after several days. ? Gets worse.  You are: ? Unusually thirsty. ? More tired than normal.  You have: ? New symptoms. ? Pain in your abdomen. ? A fever. ? Diarrhea. Get help right away if:  You develop a rash that covers all or most of your body. The rash may or may not be painful.  You develop blisters that: ? Are on top of the rash. ? Grow larger or grow together. ? Are painful. ? Are inside your nose or mouth.  You develop a rash that: ? Looks like purple pinprick-sized spots all over your body. ? Has a "bull's eye" or looks like a target. ? Is not related to sun exposure, is red and painful, and causes your skin to peel. This information is not intended to replace advice given to you by your health care provider. Make sure you discuss any questions you have with your health care provider. Document Released: 11/04/2002 Document Revised: 04/19/2016 Document Reviewed: 04/01/2015 Elsevier Interactive Patient Education  2017 [employer]  

## 2017-06-30 ENCOUNTER — Encounter: Payer: Self-pay | Admitting: Internal Medicine

## 2017-06-30 LAB — THYROID PANEL WITH TSH
Free Thyroxine Index: 2.7 (ref 1.4–3.8)
T3 UPTAKE: 31 % (ref 22–35)
T4 TOTAL: 8.6 ug/dL (ref 4.5–12.0)
TSH: 0.92 m[IU]/L (ref 0.40–4.50)

## 2017-06-30 LAB — RPR

## 2017-07-01 MED ORDER — ASPIRIN EC 81 MG PO TBEC: 81.0000 mg | DELAYED_RELEASE_TABLET | Freq: Every day | ORAL | 3 refills | Status: DC

## 2017-07-05 ENCOUNTER — Telehealth: Payer: Self-pay

## 2017-07-05 NOTE — Telephone Encounter (Signed)
Key for rapaflo: [id number] Key for Eucrisa: [facility]

## 2017-08-02 NOTE — Telephone Encounter (Signed)
Can you contact patient and inform that we have not received a response. Hoping that he has.

## 2017-08-02 NOTE — Telephone Encounter (Signed)
Patient states he spoke with [employer] and the rapaflo was approved. He did not want the eucrisa, states its not going to do any good.  Patient also stated that his insurance was going to change on Oct 1 to another company. Once that happens and if he has any issues, he states he will call.

## 2017-11-24 ENCOUNTER — Ambulatory Visit (INDEPENDENT_AMBULATORY_CARE_PROVIDER_SITE_OTHER): Payer: 59 | Admitting: Internal Medicine

## 2017-11-24 ENCOUNTER — Encounter: Payer: Self-pay | Admitting: Internal Medicine

## 2017-11-24 ENCOUNTER — Ambulatory Visit: Payer: Self-pay | Admitting: Internal Medicine

## 2017-11-24 DIAGNOSIS — R140 Abdominal distension (gaseous): Secondary | ICD-10-CM | POA: Diagnosis not present

## 2017-11-24 DIAGNOSIS — K5901 Slow transit constipation: Secondary | ICD-10-CM

## 2017-11-24 DIAGNOSIS — Z9049 Acquired absence of other specified parts of digestive tract: Secondary | ICD-10-CM

## 2017-11-24 MED ORDER — LINACLOTIDE 145 MCG PO CAPS: 145.0000 ug | ORAL_CAPSULE | Freq: Every day | ORAL | 3 refills | Status: DC

## 2017-11-24 NOTE — Patient Instructions (Addendum)
Dr Ronnald Ramp in 2-4 weeks      Gluten free trial for 4-6 weeks. OK to use gluten-free bread and gluten-free pasta.    Gluten-Free Diet for Celiac Disease, Adult The gluten-free diet includes all foods that do not contain gluten. Gluten is a protein that is found in wheat, rye, barley, and some other grains. Following the gluten-free diet is the only treatment for people with celiac disease. It helps to prevent damage to the intestines and improves or eliminates the symptoms of celiac disease. Following the gluten-free diet requires some planning. It can be challenging at first, but it gets easier with time and practice. There are more gluten-free options available today than ever before. If you need help finding gluten-free foods or if you have questions, talk with your diet and nutrition specialist (registered dietitian) or your health care provider. What do I need to know about a gluten-free diet?  All fruits, vegetables, and meats are safe to eat and do not contain gluten.  When grocery shopping, start by shopping in the produce, meat, and dairy sections. These sections are more likely to contain gluten-free foods. Then move to the aisles that contain packaged foods if you need to.  Read all food labels. Gluten is often added to foods. Always check the ingredient list and look for warnings, such as "may contain gluten."  Talk with your dietitian or health care provider before taking a gluten-free multivitamin or mineral supplement.  Be aware of gluten-free foods having contact with foods that contain gluten (cross-contamination). This can happen at home and with any processed foods. ? Talk with your health care provider or dietitian about how to reduce the risk of cross-contamination in your home. ? If you have questions about how a food is processed, ask the manufacturer. What key words help to identify gluten? Foods that list any of these key words on the label usually contain  gluten:  Wheat, flour, enriched flour, bromated flour, white flour, durum flour, graham flour, phosphated flour, self-rising flour, semolina, farina, barley (malt), rye, and oats.  Starch, dextrin, modified food starch, or cereal.  Thickening, fillers, or emulsifiers.  Malt flavoring, malt extract, or malt syrup.  Hydrolyzed vegetable protein.  In the [place] packaged foods that are gluten-free are required to be labeled "GF." These foods should be easy to identify and are safe to eat. In the [place] food companies are also required to list common food allergens, including wheat, on their labels. Recommended foods Grains  Amaranth, bean flours, 100% buckwheat flour, corn, millet, nut flours or nut meals, GF oats, quinoa, rice, sorghum, teff, rice wafers, pure cornmeal tortillas, popcorn, and hot cereals made from cornmeal. Hominy, rice, wild rice. Some Asian rice noodles or bean noodles. Arrowroot starch, corn bran, corn flour, corn germ, cornmeal, corn starch, potato flour, potato starch flour, and rice bran. Plain, brown, and sweet rice flours. Rice polish, soy flour, and tapioca starch. Vegetables  All plain fresh, frozen, and canned vegetables. Fruits  All plain fresh, frozen, canned, and dried fruits, and 100% fruit juices. Meats and other protein foods  All fresh beef, pork, poultry, fish, seafood, and eggs. Fish canned in water, oil, brine, or vegetable broth. Plain nuts and seeds, peanut butter. Some lunch meat and some frankfurters. Dried beans, dried peas, and lentils. Dairy  Fresh plain, dry, evaporated, or condensed milk. Cream, butter, sour cream, whipping cream, and most yogurts. Unprocessed cheese, most processed cheeses, some cottage cheese, some cream cheeses. Beverages  Coffee, tea, most  herbal teas. Carbonated beverages and some root beers. Wine, sake, and distilled spirits, such as gin, vodka, and whiskey. Most hard ciders. Fats and oils  Butter, margarine, vegetable  oil, hydrogenated butter, olive oil, shortening, lard, cream, and some mayonnaise. Some commercial salad dressings. Olives. Sweets and desserts  Sugar, honey, some syrups, molasses, jelly, and jam. Plain hard candy, marshmallows, and gumdrops. Pure cocoa powder. Plain chocolate. Custard and some pudding mixes. Gelatin desserts, sorbets, frozen ice pops, and sherbet. Cake, cookies, and other desserts prepared with allowed flours. Some commercial ice creams. Cornstarch, tapioca, and rice puddings. Seasoning and other foods  Some canned or frozen soups. Monosodium glutamate (MSG). Cider, rice, and wine vinegar. Baking soda and baking powder. Cream of tartar. Baking and nutritional yeast. Certain soy sauces made without wheat (ask your dietitian about specific brands that are allowed). Nuts, coconut, and chocolate. Salt, pepper, herbs, spices, flavoring extracts, imitation or artificial flavorings, natural flavorings, and food colorings. Some medicines and supplements. Some lip glosses and other cosmetics. Rice syrups. The items listed may not be a complete list. Talk with your dietitian about what dietary choices are best for you. Foods to avoid Grains  Barley, bran, bulgur, couscous, cracked wheat, [place], farro, graham, malt, matzo, semolina, wheat germ, and all wheat and rye cereals including spelt and kamut. Cereals containing malt as a flavoring, such as rice cereal. Noodles, spaghetti, macaroni, most packaged rice mixes, and all mixes containing wheat, rye, barley, or triticale. Vegetables  Most creamed vegetables and most vegetables canned in sauces. Some commercially prepared vegetables and salads. Fruits  Thickened or prepared fruits and some pie fillings. Some fruit snacks and fruit roll-ups. Meats and other protein foods  Any meat or meat alternative containing wheat, rye, barley, or gluten stabilizers. These are often marinated or packaged meats and lunch meats. Bread-containing  products, such as Swiss steak, croquettes, meatballs, and meatloaf. Most tuna canned in vegetable broth and [place] with hydrolyzed vegetable protein (HVP) injected as part of the basting. Seitan. Imitation fish. Eggs in sauces made from ingredients to avoid. Dairy  Commercial chocolate milk drinks and malted milk. Some non-dairy creamers. Any cheese product containing ingredients to avoid. Beverages  Certain cereal beverages. Beer, ale, malted milk, and some root beers. Some hard ciders. Some instant flavored coffees. Some herbal teas made with barley or with barley malt added. Fats and oils  Some commercial salad dressings. Sour cream containing modified food starch. Sweets and desserts  Some toffees. Chocolate-coated nuts (may be rolled in wheat flour) and some commercial candies and candy bars. Most cakes, cookies, donuts, pastries, and other baked goods. Some commercial ice cream. Ice cream cones. Commercially prepared mixes for cakes, cookies, and other desserts. Bread pudding and other puddings thickened with flour. Products containing brown rice syrup made with barley malt enzyme. Desserts and sweets made with malt flavoring. Seasoning and other foods  Some curry powders, some dry seasoning mixes, some gravy extracts, some meat sauces, some ketchups, some prepared mustards, and horseradish. Certain soy sauces. Malt vinegar. Bouillon and bouillon cubes that contain HVP. Some chip dips, and some chewing gum. Yeast extract. Brewer's yeast. Caramel color. Some medicines and supplements. Some lip glosses and other cosmetics. The items listed may not be a complete list. Talk with your dietitian about what dietary choices are best for you. Summary  Gluten is a protein that is found in wheat, rye, barley, and some other grains. The gluten-free diet includes all foods that do not contain gluten.  If  you need help finding gluten-free foods or if you have questions, talk with your diet and nutrition  specialist (registered dietitian) or your health care provider.  Read all food labels. Gluten is often added to foods. Always check the ingredient list and look for warnings, such as "may contain gluten." This information is not intended to replace advice given to you by your health care provider. Make sure you discuss any questions you have with your health care provider. Document Released: 11/14/2005 Document Revised: 08/29/2016 Document Reviewed: 08/29/2016 Elsevier Interactive Patient Education  2018 [employer].

## 2017-11-24 NOTE — Assessment & Plan Note (Signed)
inflammatory cecal mass due to diverticulosis 2016

## 2017-11-24 NOTE — Progress Notes (Signed)
Subjective:  Patient ID: [name], male    DOB: 03-07-1961  Age: 56 y.o. MRN: [id number]  CC: No chief complaint on file.   HPI [name] presents for abd pain and bloating - worse in the past 2 wks. C/o chronic abd pain x 2 years since surgery. C/o worsening constipation. He had to give himself an enema.Marland KitchenMarland KitchenNot taking Lipitor  Outpatient Medications Prior to Visit  Medication Sig Dispense Refill  . aspirin EC 81 MG tablet Take 1 tablet (81 mg total) by mouth daily. 90 tablet 3  . atorvastatin (LIPITOR) 20 MG tablet Take 1 tablet (20 mg total) by mouth daily. 90 tablet 3  . Crisaborole (EUCRISA) 2 % OINT Apply 1 Act topically 2 (two) times daily. 60 g 2  . finasteride (PROSCAR) 5 MG tablet Take 1 tablet (5 mg total) by mouth daily. 90 tablet 1  . linaclotide (LINZESS) 145 MCG CAPS capsule Take 1 capsule (145 mcg total) by mouth daily before breakfast. 90 capsule 1  . silodosin (RAPAFLO) 8 MG CAPS capsule Take 1 capsule (8 mg total) by mouth daily with breakfast. 90 capsule 1   No facility-administered medications prior to visit.     ROS Review of Systems  Constitutional: Positive for fatigue and unexpected weight change. Negative for appetite change.  HENT: Negative for congestion, nosebleeds, sneezing, sore throat and trouble swallowing.   Eyes: Negative for itching and visual disturbance.  Respiratory: Negative for cough.   Cardiovascular: Negative for chest pain, palpitations and leg swelling.  Gastrointestinal: Positive for abdominal distention, abdominal pain and constipation. Negative for anal bleeding, blood in stool, diarrhea, nausea and vomiting.  Genitourinary: Negative for frequency and hematuria.  Musculoskeletal: Negative for back pain, gait problem, joint swelling and neck pain.  Skin: Negative for rash.  Neurological: Negative for dizziness, tremors, speech difficulty and weakness.  Psychiatric/Behavioral: Negative for agitation, dysphoric mood and sleep  disturbance. The patient is not nervous/anxious.     Objective:  BP 122/78 (BP Location: Left Arm, Patient Position: Sitting, Cuff Size: Large)   Pulse 90   Temp 97.9 F (36.6 C) (Oral)   Ht 6' (1.829 m)   Wt 242 lb (109.8 kg)   SpO2 98%   BMI 32.82 kg/m   BP Readings from Last 3 Encounters:  11/24/17 122/78  06/29/17 120/70  04/18/17 112/78    Wt Readings from Last 3 Encounters:  11/24/17 242 lb (109.8 kg)  06/29/17 231 lb (104.8 kg)  04/18/17 232 lb (105.2 kg)    Physical Exam  Constitutional: He is oriented to person, place, and time. He appears well-developed and well-nourished. No distress.  HENT:  Head: Normocephalic and atraumatic.  Right Ear: External ear normal.  Left Ear: External ear normal.  Nose: Nose normal.  Mouth/Throat: Oropharynx is clear and moist. No oropharyngeal exudate.  Eyes: Conjunctivae and EOM are normal. Pupils are equal, round, and reactive to light. Right eye exhibits no discharge. Left eye exhibits no discharge. No scleral icterus.  Neck: Normal range of motion. Neck supple. No JVD present. No tracheal deviation present. No thyromegaly present.  Cardiovascular: Normal rate, regular rhythm, normal heart sounds and intact distal pulses. Exam reveals no gallop and no friction rub.  No murmur heard. Pulmonary/Chest: Effort normal and breath sounds normal. No stridor. No respiratory distress. He has no wheezes. He has no rales. He exhibits no tenderness.  Abdominal: Soft. Bowel sounds are normal. He exhibits distension. He exhibits no mass. There is no tenderness. There  is no rebound and no guarding.  Musculoskeletal: Normal range of motion. He exhibits no edema or tenderness.  Lymphadenopathy:    He has no cervical adenopathy.  Neurological: He is alert and oriented to person, place, and time. He has normal reflexes. No cranial nerve deficit. He exhibits normal muscle tone. Coordination normal.  Skin: Skin is warm and dry. No rash noted. He is  not diaphoretic. No erythema. No pallor.  Psychiatric: He has a normal mood and affect. His behavior is normal. Judgment and thought content normal.  abd NT, round No mass Declined rectal  Lab Results  Component Value Date   WBC 5.1 06/29/2017   HGB 14.1 06/29/2017   HCT 42.0 06/29/2017   PLT 236.0 06/29/2017   GLUCOSE 81 06/29/2017   CHOL 152 06/29/2017   TRIG 55.0 06/29/2017   HDL 51.50 06/29/2017   LDLDIRECT 132.4 07/29/2008   LDLCALC 90 06/29/2017   ALT 16 06/29/2017   AST 19 06/29/2017   NA 141 06/29/2017   K 4.1 06/29/2017   CL 107 06/29/2017   CREATININE 1.20 06/29/2017   BUN 14 06/29/2017   CO2 27 06/29/2017   TSH 0.92 06/29/2017   PSA 2.41 06/29/2017   HGBA1C 5.8 08/13/2015   MICROALBUR 1.2 03/22/2012    Dg Chest 2 View  Result Date: 11/05/2016 CLINICAL DATA:  Right upper quadrant pain for several days, shortness of Breath EXAM: CHEST  2 VIEW COMPARISON:  08/13/2015 FINDINGS: The heart size and mediastinal contours are within normal limits. Both lungs are clear. The visualized skeletal structures are unremarkable. IMPRESSION: No active cardiopulmonary disease. Electronically Signed   By: Inez Catalina M.D.   On: 11/05/2016 17:41   Ct Abdomen Pelvis W Contrast  Result Date: 11/05/2016 CLINICAL DATA:  Right upper quadrant pain radiating through to the back. Bloating for 1 week. Constipation. Fever and nausea. EXAM: CT ABDOMEN AND PELVIS WITH CONTRAST TECHNIQUE: Multidetector CT imaging of the abdomen and pelvis was performed using the standard protocol following bolus administration of intravenous contrast. CONTRAST:  129mL ISOVUE-300 IOPAMIDOL (ISOVUE-300) INJECTION 61% COMPARISON:  March 22, 2015 FINDINGS: Lower chest: No acute abnormality. Hepatobiliary: No focal liver abnormality is seen. No gallstones, gallbladder wall thickening, or biliary dilatation. Pancreas: Unremarkable. No pancreatic ductal dilatation or surrounding inflammatory changes. Spleen: Normal in size  without focal abnormality. Adrenals/Urinary Tract: Adrenal glands are unremarkable. Kidneys are normal, without renal calculi, focal lesion, or hydronephrosis. Bladder is unremarkable. Stomach/Bowel: The stomach is normal in appearance. The small bowel obstruction seen on the previous study has resolved. Visualized small bowel is normal today. The patient is status post partial colonic resection with anastomosis in the right side of the abdomen. No colonic inflammation is identified today. Mild fecal loading in the colon. No obstruction identified. Vascular/Lymphatic: No significant vascular findings are present. No enlarged abdominal or pelvic lymph nodes. Reproductive: Prostate is unremarkable. Other: Fat containing ventral hernia. No other additional abnormalities. Musculoskeletal: No acute or significant osseous findings. IMPRESSION: 1. No acute abnormalities.  Mild fecal loading in the colon. Electronically Signed   By: Dorise Bullion III M.D   On: 11/05/2016 18:16    Assessment & Plan:   There are no diagnoses linked to this encounter. I am having Alvester Chou T. Jim maintain his Crisaborole, silodosin, finasteride, linaclotide, atorvastatin, and aspirin EC.  No orders of the defined types were placed in this encounter.    Follow-up: No Follow-up on file.  Walker Kehr, MD

## 2017-11-24 NOTE — Assessment & Plan Note (Signed)
Treat constipation No signs of SBO or diverticulitis at the moment To ER if worse Try gluten free diet/mediterrenian diet

## 2017-11-24 NOTE — Assessment & Plan Note (Addendum)
Last Colon 12/2016 - ok Dr Hilarie Fredrickson Restart Linzess Enema prn

## 2018-01-18 ENCOUNTER — Other Ambulatory Visit: Payer: Self-pay | Admitting: Internal Medicine

## 2018-01-18 DIAGNOSIS — R351 Nocturia: Principal | ICD-10-CM

## 2018-01-18 DIAGNOSIS — N401 Enlarged prostate with lower urinary tract symptoms: Secondary | ICD-10-CM

## 2018-05-03 ENCOUNTER — Telehealth: Payer: Self-pay | Admitting: Internal Medicine

## 2018-05-03 NOTE — Telephone Encounter (Signed)
Copied from [place] [id number]. Topic: Quick Communication - See Telephone Encounter >> May 03, 2018  3:05 PM Rutherford Nail, NT wrote: CRM for notification. See Telephone encounter for: 05/03/18. Patient calling and is wanting a call back to discuss his prostate medication. States that he does not think it is working. States that he still has frequency to the bathroom. Would like to know if the medication is actually doing what it is supposed to. CB#: [phone]

## 2018-05-03 NOTE — Telephone Encounter (Signed)
Pt called back and was able to confirm that he has been taking the finasteride as directed and has not seen any benefit in taking it. Informed pt that we can address the issue on his next appt.

## 2018-05-03 NOTE — Telephone Encounter (Signed)
LVM for pt to call back as soon as possible.   RE: I have some questions for pt regarding the finasteride and also pt is due for follow up.

## 2018-05-29 ENCOUNTER — Encounter: Payer: Self-pay | Admitting: Internal Medicine

## 2018-05-29 ENCOUNTER — Other Ambulatory Visit (INDEPENDENT_AMBULATORY_CARE_PROVIDER_SITE_OTHER): Payer: 59

## 2018-05-29 ENCOUNTER — Ambulatory Visit (INDEPENDENT_AMBULATORY_CARE_PROVIDER_SITE_OTHER): Payer: 59 | Admitting: Internal Medicine

## 2018-05-29 VITALS — BP 130/78 | HR 67 | Temp 99.0°F | Ht 72.0 in | Wt 228.5 lb

## 2018-05-29 DIAGNOSIS — E785 Hyperlipidemia, unspecified: Secondary | ICD-10-CM

## 2018-05-29 DIAGNOSIS — N401 Enlarged prostate with lower urinary tract symptoms: Secondary | ICD-10-CM

## 2018-05-29 DIAGNOSIS — I2510 Atherosclerotic heart disease of native coronary artery without angina pectoris: Secondary | ICD-10-CM | POA: Diagnosis not present

## 2018-05-29 DIAGNOSIS — K5904 Chronic idiopathic constipation: Secondary | ICD-10-CM

## 2018-05-29 DIAGNOSIS — G4733 Obstructive sleep apnea (adult) (pediatric): Secondary | ICD-10-CM

## 2018-05-29 DIAGNOSIS — R5383 Other fatigue: Secondary | ICD-10-CM

## 2018-05-29 DIAGNOSIS — Z0000 Encounter for general adult medical examination without abnormal findings: Secondary | ICD-10-CM

## 2018-05-29 DIAGNOSIS — R351 Nocturia: Secondary | ICD-10-CM | POA: Diagnosis not present

## 2018-05-29 DIAGNOSIS — F528 Other sexual dysfunction not due to a substance or known physiological condition: Secondary | ICD-10-CM

## 2018-05-29 LAB — COMPREHENSIVE METABOLIC PANEL
ALBUMIN: 4.2 g/dL (ref 3.5–5.2)
ALK PHOS: 72 U/L (ref 39–117)
ALT: 17 U/L (ref 0–53)
AST: 15 U/L (ref 0–37)
BILIRUBIN TOTAL: 0.7 mg/dL (ref 0.2–1.2)
BUN: 13 mg/dL (ref 6–23)
CALCIUM: 8.9 mg/dL (ref 8.4–10.5)
CO2: 30 mEq/L (ref 19–32)
CREATININE: 1.35 mg/dL (ref 0.40–1.50)
Chloride: 106 mEq/L (ref 96–112)
GFR: 70.59 mL/min (ref >60.00)
Glucose, Bld: 94 mg/dL (ref 70–99)
Potassium: 3.7 mEq/L (ref 3.5–5.1)
Sodium: 142 mEq/L (ref 135–145)
TOTAL PROTEIN: 7 g/dL (ref 6.0–8.3)

## 2018-05-29 LAB — URINALYSIS, ROUTINE W REFLEX MICROSCOPIC
BILIRUBIN URINE: NEGATIVE
HGB URINE DIPSTICK: NEGATIVE
Ketones, ur: NEGATIVE
LEUKOCYTES UA: NEGATIVE
NITRITE: NEGATIVE
RBC / HPF: NONE SEEN (ref 0–?)
Specific Gravity, Urine: 1.015 (ref 1.000–1.030)
TOTAL PROTEIN, URINE-UPE24: NEGATIVE
URINE GLUCOSE: NEGATIVE
Urobilinogen, UA: 0.2 (ref 0.0–1.0)
pH: 5.5 (ref 5.0–8.0)

## 2018-05-29 LAB — LIPID PANEL
CHOLESTEROL: 148 mg/dL (ref 0–200)
HDL: 53.7 mg/dL (ref >39.00)
LDL Cholesterol: 81 mg/dL (ref 0–99)
NONHDL: 93.87
Total CHOL/HDL Ratio: 3
Triglycerides: 62 mg/dL (ref 0.0–149.0)
VLDL: 12.4 mg/dL (ref 0.0–40.0)

## 2018-05-29 LAB — CBC WITH DIFFERENTIAL/PLATELET
BASOS ABS: 0 10*3/uL (ref 0.0–0.1)
Basophils Relative: 0.3 % (ref 0.0–3.0)
EOS ABS: 0.2 10*3/uL (ref 0.0–0.7)
Eosinophils Relative: 5 % (ref 0.0–5.0)
HEMATOCRIT: 42.1 % (ref 39.0–52.0)
HEMOGLOBIN: 14.4 g/dL (ref 13.0–17.0)
LYMPHS PCT: 27.6 % (ref 12.0–46.0)
Lymphs Abs: 1.1 10*3/uL (ref 0.7–4.0)
MCHC: 34.1 g/dL (ref 30.0–36.0)
MCV: 84.3 fl (ref 78.0–100.0)
MONOS PCT: 11.4 % (ref 3.0–12.0)
Monocytes Absolute: 0.5 10*3/uL (ref 0.1–1.0)
Neutro Abs: 2.3 10*3/uL (ref 1.4–7.7)
Neutrophils Relative %: 55.7 % (ref 43.0–77.0)
Platelets: 224 10*3/uL (ref 150.0–400.0)
RBC: 5 Mil/uL (ref 4.22–5.81)
RDW: 13.7 % (ref 11.5–15.5)
WBC: 4.1 10*3/uL (ref 4.0–10.5)

## 2018-05-29 LAB — PSA: PSA: 4.4

## 2018-05-29 LAB — TSH: TSH: 1.05 u[IU]/mL (ref 0.35–4.50)

## 2018-05-29 MED ORDER — PRUCALOPRIDE SUCCINATE 2 MG PO TABS: 1.0000 | ORAL_TABLET | Freq: Every day | ORAL | 1 refills | Status: DC

## 2018-05-29 NOTE — Progress Notes (Signed)
Subjective:  Patient ID: [name], male    DOB: [birth year]  Age: 57 y.o. MRN: [id number]   CC: Annual Exam; Coronary Artery Disease; and Hyperlipidemia   HPI [name] presents for a CPX.  He complains of worsening BPH symptoms and tells me the current combination of finasteride and Rapaflo is not helping.  He has persistent and somewhat worsening urgency, frequency, nocturia, and dribbling.  He also complains of chronic, persistent constipation.  He tried MiraLAX and Linzess but they both cause stool urgency so he is not taking them anymore.  He denies abdominal pain, bloating, diarrhea, or blood in his stool.  Outpatient Medications Prior to Visit  Medication Sig Dispense Refill  . aspirin EC 81 MG tablet Take 1 tablet (81 mg total) by mouth daily. 90 tablet 3  . atorvastatin (LIPITOR) 20 MG tablet Take 1 tablet (20 mg total) by mouth daily. 90 tablet 3  . finasteride (PROSCAR) 5 MG tablet TAKE 1 TABLET BY MOUTH EVERY DAY 90 tablet 1  . silodosin (RAPAFLO) 8 MG CAPS capsule Take 1 capsule (8 mg total) by mouth daily with breakfast. 90 capsule 1  . Crisaborole (EUCRISA) 2 % OINT Apply 1 Act topically 2 (two) times daily. (Patient not taking: Reported on 05/29/2018) 60 g 2  . linaclotide (LINZESS) 145 MCG CAPS capsule Take 1 capsule (145 mcg total) by mouth daily before breakfast. (Patient not taking: Reported on 05/29/2018) 90 capsule 3   No facility-administered medications prior to visit.     ROS Review of Systems  Constitutional: Positive for fatigue. Negative for appetite change, diaphoresis and unexpected weight change.  Eyes: Negative for visual disturbance.  Respiratory: Positive for apnea. Negative for cough, chest tightness, shortness of breath and wheezing.   Cardiovascular: Negative for chest pain, palpitations and leg swelling.  Gastrointestinal: Positive for constipation. Negative for abdominal distention, abdominal pain, blood in stool, diarrhea, nausea and  vomiting.  Genitourinary: Positive for difficulty urinating and frequency. Negative for decreased urine volume, discharge, dysuria, enuresis, flank pain, genital sores, hematuria, penile pain, penile swelling, scrotal swelling, testicular pain and urgency.       He complains of erectile dysfunction and low libido  Musculoskeletal: Negative for arthralgias, back pain and myalgias.  Skin: Negative.  Negative for color change and rash.  Neurological: Negative.  Negative for dizziness, weakness, light-headedness and headaches.  Hematological: Negative.  Negative for adenopathy. Does not bruise/bleed easily.  Psychiatric/Behavioral: Negative.  Negative for decreased concentration and suicidal ideas. The patient is not nervous/anxious.     Objective:  BP 130/78 (BP Location: Left Arm, Patient Position: Sitting, Cuff Size: Large)   Pulse 67   Temp 99 F (37.2 C) (Oral)   Ht 6' (1.829 m)   Wt 228 lb 8 oz (103.6 kg)   SpO2 97%   BMI 30.99 kg/m   BP Readings from Last 3 Encounters:  05/29/18 130/78  11/24/17 122/78  06/29/17 120/70    Wt Readings from Last 3 Encounters:  05/29/18 228 lb 8 oz (103.6 kg)  11/24/17 242 lb (109.8 kg)  06/29/17 231 lb (104.8 kg)    Physical Exam  Constitutional: He is oriented to person, place, and time. He appears well-developed and well-nourished. No distress.  HENT:  Mouth/Throat: Oropharynx is clear and moist. No oropharyngeal exudate.  Eyes: Conjunctivae are normal. No scleral icterus.  Neck: Normal range of motion. Neck supple. No JVD present. No thyromegaly present.  Cardiovascular: Normal rate, regular rhythm and normal heart sounds.  Exam reveals no gallop.  No murmur heard. EKG-  Sinus  Bradycardia  WITHIN NORMAL LIMITS  No change from the prior EKG  Pulmonary/Chest: Effort normal and breath sounds normal. No respiratory distress. He has no wheezes. He has no rales.  Abdominal: Soft. Normal appearance and bowel sounds are normal. He exhibits  no mass. There is no hepatosplenomegaly. There is no tenderness. No hernia. Hernia confirmed negative in the right inguinal area and confirmed negative in the left inguinal area.  Genitourinary: Testes normal and penis normal. Rectal exam shows no external hemorrhoid, no internal hemorrhoid, no fissure, no mass, no tenderness, anal tone normal and guaiac negative stool. Prostate is enlarged (2++ hypertrophy). Prostate is not tender. Cremasteric reflex is present. Right testis shows no mass, no swelling and no tenderness. Right testis is descended. Left testis shows no mass, no swelling and no tenderness. Left testis is descended. Circumcised. No penile erythema or penile tenderness. No discharge found.  Musculoskeletal: Normal range of motion. He exhibits no edema, tenderness or deformity.  Lymphadenopathy:    He has no cervical adenopathy. No inguinal adenopathy noted on the right or left side.  Neurological: He is alert and oriented to person, place, and time.  Skin: Skin is warm and dry. No rash noted. He is not diaphoretic. No erythema. No pallor.  Psychiatric: He has a normal mood and affect. His behavior is normal. Judgment and thought content normal.  Vitals reviewed.   Lab Results  Component Value Date   WBC 4.1 05/29/2018   HGB 14.4 05/29/2018   HCT 42.1 05/29/2018   PLT 224.0 05/29/2018   GLUCOSE 94 05/29/2018   CHOL 148 05/29/2018   TRIG 62.0 05/29/2018   HDL 53.70 05/29/2018   LDLDIRECT 132.4 07/29/2008   LDLCALC 81 05/29/2018   ALT 17 05/29/2018   AST 15 05/29/2018   NA 142 05/29/2018   K 3.7 05/29/2018   CL 106 05/29/2018   CREATININE 1.35 05/29/2018   BUN 13 05/29/2018   CO2 30 05/29/2018   TSH 1.05 05/29/2018   PSA 2.41 06/29/2017   HGBA1C 5.8 08/13/2015   MICROALBUR 1.2 03/22/2012    Dg Chest 2 View  Result Date: 11/05/2016 CLINICAL DATA:  Right upper quadrant pain for several days, shortness of Breath EXAM: CHEST  2 VIEW COMPARISON:  08/13/2015 FINDINGS: The  heart size and mediastinal contours are within normal limits. Both lungs are clear. The visualized skeletal structures are unremarkable. IMPRESSION: No active cardiopulmonary disease. Electronically Signed   By: Inez Catalina M.D.   On: 11/05/2016 17:41   Ct Abdomen Pelvis W Contrast  Result Date: 11/05/2016 CLINICAL DATA:  Right upper quadrant pain radiating through to the back. Bloating for 1 week. Constipation. Fever and nausea. EXAM: CT ABDOMEN AND PELVIS WITH CONTRAST TECHNIQUE: Multidetector CT imaging of the abdomen and pelvis was performed using the standard protocol following bolus administration of intravenous contrast. CONTRAST:  153mL ISOVUE-300 IOPAMIDOL (ISOVUE-300) INJECTION 61% COMPARISON:  March 22, 2015 FINDINGS: Lower chest: No acute abnormality. Hepatobiliary: No focal liver abnormality is seen. No gallstones, gallbladder wall thickening, or biliary dilatation. Pancreas: Unremarkable. No pancreatic ductal dilatation or surrounding inflammatory changes. Spleen: Normal in size without focal abnormality. Adrenals/Urinary Tract: Adrenal glands are unremarkable. Kidneys are normal, without renal calculi, focal lesion, or hydronephrosis. Bladder is unremarkable. Stomach/Bowel: The stomach is normal in appearance. The small bowel obstruction seen on the previous study has resolved. Visualized small bowel is normal today. The patient is status post partial colonic resection  with anastomosis in the right side of the abdomen. No colonic inflammation is identified today. Mild fecal loading in the colon. No obstruction identified. Vascular/Lymphatic: No significant vascular findings are present. No enlarged abdominal or pelvic lymph nodes. Reproductive: Prostate is unremarkable. Other: Fat containing ventral hernia. No other additional abnormalities. Musculoskeletal: No acute or significant osseous findings. IMPRESSION: 1. No acute abnormalities.  Mild fecal loading in the colon. Electronically Signed    By: Dorise Bullion III M.D   On: 11/05/2016 18:16    Assessment & Plan:   Nasier was seen today for annual exam, coronary artery disease and hyperlipidemia.  Diagnoses and all orders for this visit:  Atherosclerosis of native coronary artery of native heart without angina pectoris- He denies any recent episodes of angina.  His EKG is negative for ischemia.  Will continue aggressive risk factor modifications. -     EKG 12-Lead  OBSTRUCTIVE SLEEP APNEA -     Ambulatory referral to Sleep Studies  Hyperlipidemia with target LDL less than 70- He has achieved his LDL goal and is doing well on the statin. -     Comprehensive metabolic panel; Future  Routine general medical examination at a health care facility- Exam completed, labs reviewed, vaccines reviewed, screening for colon cancer is up-to-date, patient education material was given. -     Lipid panel; Future -     HIV antibody; Future  Fatigue, unspecified type-I have asked him to be tested for sleep apnea.  We will also check his labs to see if there are any secondary causes. -     Comprehensive metabolic panel; Future -     CBC with Differential/Platelet; Future -     TSH; Future -     Testosterone Total,Free,Bio, Males; Future  ERECTILE DYSFUNCTION  Benign prostatic hyperplasia with nocturia- I will monitor his PSA to screen for prostate cancer.  Also, since he has not responded to the current meds I have asked him to see urology to consider a surgical procedure to help with his symptoms. -     PSA, total and free; Future -     Urinalysis, Routine w reflex microscopic; Future -     Ambulatory referral to Urology  Chronic idiopathic constipation- Since he did not respond well to [place] or MiraLAX will try [place] gritty. -     Prucalopride Succinate (MOTEGRITY) 2 MG TABS; Take 1 tablet by mouth daily.   I have discontinued Alvester Chou T. Perella's Crisaborole and linaclotide. I am also having him start on Prucalopride Succinate.  Additionally, I am having him maintain his silodosin, atorvastatin, aspirin EC, and finasteride.  Meds ordered this encounter  Medications  . Prucalopride Succinate (MOTEGRITY) 2 MG TABS    Sig: Take 1 tablet by mouth daily.    Dispense:  90 tablet    Refill:  1     Follow-up: Return in about 3 months (around 08/29/2018).  Scarlette Calico, MD

## 2018-05-29 NOTE — Patient Instructions (Signed)

## 2018-05-30 ENCOUNTER — Encounter: Payer: Self-pay | Admitting: Internal Medicine

## 2018-05-30 ENCOUNTER — Telehealth: Payer: Self-pay | Admitting: Internal Medicine

## 2018-05-30 LAB — TESTOSTERONE TOTAL,FREE,BIO, MALES
Albumin: 4.3 g/dL (ref 3.6–5.1)
Sex Hormone Binding: 41 nmol/L (ref 10–50)
TESTOSTERONE BIOAVAILABLE: 83.4 ng/dL — AB (ref >=110.0)
TESTOSTERONE FREE: 42.3 pg/mL — AB (ref 46.0–224.0)
Testosterone: 386 ng/dL (ref 250–827)

## 2018-05-30 LAB — PSA, TOTAL AND FREE
PSA, % Free: 16 % (calc) — ABNORMAL LOW (ref >25)
PSA, FREE: 0.7 ng/mL
PSA, Total: 4.4 ng/mL — ABNORMAL HIGH (ref <=4.0)

## 2018-05-30 LAB — HIV ANTIBODY (ROUTINE TESTING W REFLEX): HIV: NONREACTIVE

## 2018-05-30 NOTE — Telephone Encounter (Signed)
TC from patient after receiving email from Dr. Ronnald Ramp with lab results. He had questions regarding the elevated prostate level. Reviewed lab results and physician's note with patient. Explained the referral process for Urology consult. That office will be in touch with him to schedule an appointment. Call back in one week if he has not heard from urologist office.

## 2018-07-10 DIAGNOSIS — R972 Elevated prostate specific antigen [PSA]: Secondary | ICD-10-CM | POA: Diagnosis not present

## 2018-07-10 DIAGNOSIS — N401 Enlarged prostate with lower urinary tract symptoms: Secondary | ICD-10-CM | POA: Diagnosis not present

## 2018-07-10 DIAGNOSIS — E291 Testicular hypofunction: Secondary | ICD-10-CM | POA: Diagnosis not present

## 2018-07-10 DIAGNOSIS — R350 Frequency of micturition: Secondary | ICD-10-CM | POA: Diagnosis not present

## 2018-07-31 ENCOUNTER — Telehealth: Payer: Self-pay

## 2018-07-31 ENCOUNTER — Institutional Professional Consult (permissible substitution): Payer: Self-pay | Admitting: Neurology

## 2018-07-31 NOTE — Telephone Encounter (Signed)
Pt did not show for their appt with Dr. Athar today.  

## 2018-08-01 ENCOUNTER — Encounter: Payer: Self-pay | Admitting: Neurology

## 2018-10-15 DIAGNOSIS — R350 Frequency of micturition: Secondary | ICD-10-CM | POA: Diagnosis not present

## 2018-10-15 DIAGNOSIS — E291 Testicular hypofunction: Secondary | ICD-10-CM | POA: Diagnosis not present

## 2018-10-15 DIAGNOSIS — N401 Enlarged prostate with lower urinary tract symptoms: Secondary | ICD-10-CM | POA: Diagnosis not present

## 2018-12-14 ENCOUNTER — Telehealth: Payer: Self-pay

## 2018-12-14 ENCOUNTER — Ambulatory Visit (INDEPENDENT_AMBULATORY_CARE_PROVIDER_SITE_OTHER): Payer: 59

## 2018-12-14 ENCOUNTER — Other Ambulatory Visit: Payer: Self-pay | Admitting: Internal Medicine

## 2018-12-14 DIAGNOSIS — Z23 Encounter for immunization: Secondary | ICD-10-CM | POA: Diagnosis not present

## 2018-12-14 DIAGNOSIS — K5904 Chronic idiopathic constipation: Secondary | ICD-10-CM

## 2018-12-14 MED ORDER — PLECANATIDE 3 MG PO TABS: 1.0000 | ORAL_TABLET | Freq: Every day | ORAL | 1 refills | Status: DC

## 2018-12-14 NOTE — Telephone Encounter (Signed)
PA was approved but the cost was still 600 dollars pharmacist states that the preferred med is movantik

## 2018-12-14 NOTE — Telephone Encounter (Signed)
Patient came in for flu shot today and was asking for his linzess 145mg  tabs to be sent in because he never got them last time because it was too expensive. Patient is having issues with constipation and wanted to know if he could get a refill with 10-15 tablets with some refills to see if it would be cheaper than the 90 tabs. I talked with stefannie as it was not on current med list and she said since patient was supposed to come in in October for a follow up we could not send in. Patient was not that happy about it so I told him I could send a message to his PCP.

## 2018-12-14 NOTE — Telephone Encounter (Signed)
His insurance company does not cover Motegrity or Linzess this year. He has to try Trulance. I sent this prescription to his pharmacy.  TJ

## 2018-12-14 NOTE — Telephone Encounter (Signed)
PA started on CoverMyMeds KEY: [id number]

## 2018-12-14 NOTE — Telephone Encounter (Signed)
Called patient and pharmacy did a PA on medication documented in separate telephone encounter

## 2018-12-15 ENCOUNTER — Other Ambulatory Visit: Payer: Self-pay | Admitting: Internal Medicine

## 2018-12-15 DIAGNOSIS — K5904 Chronic idiopathic constipation: Secondary | ICD-10-CM

## 2018-12-15 MED ORDER — PRUCALOPRIDE SUCCINATE 2 MG PO TABS: 1.0000 | ORAL_TABLET | Freq: Every day | ORAL | 1 refills | Status: DC

## 2019-07-08 ENCOUNTER — Encounter: Payer: 59 | Admitting: Internal Medicine

## 2019-07-16 ENCOUNTER — Encounter: Payer: Self-pay | Admitting: Internal Medicine

## 2019-07-16 ENCOUNTER — Ambulatory Visit (INDEPENDENT_AMBULATORY_CARE_PROVIDER_SITE_OTHER): Payer: 59 | Admitting: Internal Medicine

## 2019-07-16 ENCOUNTER — Other Ambulatory Visit: Payer: 59

## 2019-07-16 ENCOUNTER — Other Ambulatory Visit: Payer: Self-pay

## 2019-07-16 VITALS — BP 128/80 | HR 67 | Temp 98.1°F | Resp 16 | Ht 72.0 in | Wt 226.0 lb

## 2019-07-16 DIAGNOSIS — L03111 Cellulitis of right axilla: Secondary | ICD-10-CM

## 2019-07-16 DIAGNOSIS — Z0000 Encounter for general adult medical examination without abnormal findings: Secondary | ICD-10-CM

## 2019-07-16 DIAGNOSIS — L02411 Cutaneous abscess of right axilla: Secondary | ICD-10-CM

## 2019-07-16 DIAGNOSIS — R972 Elevated prostate specific antigen [PSA]: Secondary | ICD-10-CM | POA: Insufficient documentation

## 2019-07-16 DIAGNOSIS — I2510 Atherosclerotic heart disease of native coronary artery without angina pectoris: Secondary | ICD-10-CM

## 2019-07-16 DIAGNOSIS — K5904 Chronic idiopathic constipation: Secondary | ICD-10-CM | POA: Diagnosis not present

## 2019-07-16 DIAGNOSIS — R7303 Prediabetes: Secondary | ICD-10-CM | POA: Insufficient documentation

## 2019-07-16 DIAGNOSIS — E785 Hyperlipidemia, unspecified: Secondary | ICD-10-CM

## 2019-07-16 MED ORDER — OXYCODONE HCL 5 MG PO TABS: 5.0000 mg | ORAL_TABLET | ORAL | 0 refills | Status: DC | PRN

## 2019-07-16 MED ORDER — TRULANCE 3 MG PO TABS: 1.0000 | ORAL_TABLET | Freq: Every day | ORAL | 1 refills | Status: DC

## 2019-07-16 MED ORDER — PROMETHAZINE HCL 12.5 MG PO TABS: 12.5000 mg | ORAL_TABLET | Freq: Four times a day (QID) | ORAL | 0 refills | Status: DC | PRN

## 2019-07-16 MED ORDER — DOXYCYCLINE MONOHYDRATE 100 MG PO CAPS: 100.0000 mg | ORAL_CAPSULE | Freq: Two times a day (BID) | ORAL | 0 refills | Status: AC

## 2019-07-16 NOTE — Progress Notes (Signed)
Subjective:  Patient ID: [name], male    DOB: June 22, 1961  Age: 58 y.o. MRN: [id number]  CC: Recurrent Skin Infections   HPI [name] presents for f/up - He was scheduled for a physical today but when he got here he told us he had had a one-week episode of pain, redness, and swelling in his right axilla.  He has had trouble with this before and it sounds like he has had several incisions and drainage.    He also complains of chronic constipation.  He was previously prescribed Motegrity but he is no longer taking it.  He said he can go 3 to 4 days without a bowel movement and then he starts feeling bloated and crampy and uses MiraLAX and stimulants to finally force a bowel movement.  Outpatient Medications Prior to Visit  Medication Sig Dispense Refill  . aspirin EC 81 MG tablet Take 1 tablet (81 mg total) by mouth daily. 90 tablet 3  . Prucalopride Succinate (MOTEGRITY) 2 MG TABS Take 1 tablet by mouth daily. 90 tablet 1  . atorvastatin (LIPITOR) 20 MG tablet Take 1 tablet (20 mg total) by mouth daily. (Patient not taking: Reported on 07/16/2019) 90 tablet 3  . finasteride (PROSCAR) 5 MG tablet TAKE 1 TABLET BY MOUTH EVERY DAY (Patient not taking: Reported on 07/16/2019) 90 tablet 1  . silodosin (RAPAFLO) 8 MG CAPS capsule Take 1 capsule (8 mg total) by mouth daily with breakfast. (Patient not taking: Reported on 07/16/2019) 90 capsule 1   No facility-administered medications prior to visit.     ROS Review of Systems  Constitutional: Negative for chills, diaphoresis, fatigue and fever.  HENT: Negative.   Eyes: Negative.   Respiratory: Negative for cough, chest tightness, shortness of breath and wheezing.   Gastrointestinal: Positive for constipation. Negative for abdominal pain, blood in stool, diarrhea, nausea and vomiting.  Genitourinary: Negative.  Negative for difficulty urinating.  Musculoskeletal: Negative.   Skin: Positive for color change.  Neurological: Negative.    Hematological: Negative for adenopathy. Does not bruise/bleed easily.  Psychiatric/Behavioral: Negative.     Objective:  BP 128/80 (BP Location: Left Arm, Patient Position: Sitting, Cuff Size: Large)   Pulse 67   Temp 98.1 F (36.7 C) (Oral)   Resp 16   Ht 6' (1.829 m)   Wt 226 lb (102.5 kg)   SpO2 98%   BMI 30.65 kg/m   BP Readings from Last 3 Encounters:  07/16/19 128/80  05/29/18 130/78  11/24/17 122/78    Wt Readings from Last 3 Encounters:  07/16/19 226 lb (102.5 kg)  05/29/18 228 lb 8 oz (103.6 kg)  11/24/17 242 lb (109.8 kg)    Physical Exam Constitutional:      General: He is not in acute distress.    Appearance: He is obese. He is not ill-appearing, toxic-appearing or diaphoretic.  HENT:     Nose: Nose normal.  Eyes:     General: No scleral icterus.    Conjunctiva/sclera: Conjunctivae normal.  Neck:     Musculoskeletal: Normal range of motion. No neck rigidity or muscular tenderness.  Cardiovascular:     Rate and Rhythm: Normal rate and regular rhythm.     Heart sounds: No murmur.  Pulmonary:     Effort: Pulmonary effort is normal. No respiratory distress.     Breath sounds: No stridor. No wheezing, rhonchi or rales.  Abdominal:     General: Abdomen is protuberant. Bowel sounds are decreased.  There is no distension.     Palpations: Abdomen is soft. There is no hepatomegaly, splenomegaly or mass.     Tenderness: There is no abdominal tenderness. There is no guarding.  Musculoskeletal: Normal range of motion.     Right lower leg: No edema.     Left lower leg: No edema.  Lymphadenopathy:     Cervical: No cervical adenopathy.  Skin:    General: Skin is warm and dry.     Comments: In the right axilla there is a scar.  Within the distal aspect of the scar there is a raised area of subcutaneous induration, fluctuance, erythema, tenderness, and warmth.  There is no streaking.  See photo.  Neurological:     General: No focal deficit present.     Mental  Status: He is alert.     Lab Results  Component Value Date   WBC 4.1 05/29/2018   HGB 14.4 05/29/2018   HCT 42.1 05/29/2018   PLT 224.0 05/29/2018   GLUCOSE 94 05/29/2018   CHOL 148 05/29/2018   TRIG 62.0 05/29/2018   HDL 53.70 05/29/2018   LDLDIRECT 132.4 07/29/2008   LDLCALC 81 05/29/2018   ALT 17 05/29/2018   AST 15 05/29/2018   NA 142 05/29/2018   K 3.7 05/29/2018   CL 106 05/29/2018   CREATININE 1.35 05/29/2018   BUN 13 05/29/2018   CO2 30 05/29/2018   TSH 1.05 05/29/2018   PSA 4.4 05/29/2018   HGBA1C 5.8 08/13/2015   MICROALBUR 1.2 03/22/2012    Dg Chest 2 View  Result Date: 11/05/2016 CLINICAL DATA:  Right upper quadrant pain for several days, shortness of Breath EXAM: CHEST  2 VIEW COMPARISON:  08/13/2015 FINDINGS: The heart size and mediastinal contours are within normal limits. Both lungs are clear. The visualized skeletal structures are unremarkable. IMPRESSION: No active cardiopulmonary disease. Electronically Signed   By: Inez Catalina M.D.   On: 11/05/2016 17:41   Ct Abdomen Pelvis W Contrast  Result Date: 11/05/2016 CLINICAL DATA:  Right upper quadrant pain radiating through to the back. Bloating for 1 week. Constipation. Fever and nausea. EXAM: CT ABDOMEN AND PELVIS WITH CONTRAST TECHNIQUE: Multidetector CT imaging of the abdomen and pelvis was performed using the standard protocol following bolus administration of intravenous contrast. CONTRAST:  131mL ISOVUE-300 IOPAMIDOL (ISOVUE-300) INJECTION 61% COMPARISON:  March 22, 2015 FINDINGS: Lower chest: No acute abnormality. Hepatobiliary: No focal liver abnormality is seen. No gallstones, gallbladder wall thickening, or biliary dilatation. Pancreas: Unremarkable. No pancreatic ductal dilatation or surrounding inflammatory changes. Spleen: Normal in size without focal abnormality. Adrenals/Urinary Tract: Adrenal glands are unremarkable. Kidneys are normal, without renal calculi, focal lesion, or hydronephrosis. Bladder  is unremarkable. Stomach/Bowel: The stomach is normal in appearance. The small bowel obstruction seen on the previous study has resolved. Visualized small bowel is normal today. The patient is status post partial colonic resection with anastomosis in the right side of the abdomen. No colonic inflammation is identified today. Mild fecal loading in the colon. No obstruction identified. Vascular/Lymphatic: No significant vascular findings are present. No enlarged abdominal or pelvic lymph nodes. Reproductive: Prostate is unremarkable. Other: Fat containing ventral hernia. No other additional abnormalities. Musculoskeletal: No acute or significant osseous findings. IMPRESSION: 1. No acute abnormalities.  Mild fecal loading in the colon. Electronically Signed   By: Dorise Bullion III M.D   On: 11/05/2016 18:16    After informed verbal consent was obtained, using Betadine for cleansing and 1% Lidocaine with epinephrine for anesthetic,  3 cc's were used,  with sterile technique a 4 mm punch incision was made.  A small cavity was identified and purulent exudate was expressed.  A culture has been sent.  The cavity was explored with hydrogen peroxide and Q-tips.  Several small loculations were disrupted.  The cavity was then packed with iodoform.  Wound care instructions provided.  There was no blood loss.  The procedure was well tolerated without complications.  Assessment & Plan:   [name] was seen today for recurrent skin infections.  Diagnoses and all orders for this visit:  Atherosclerosis of native coronary artery of native heart without angina pectoris -     CBC with Differential/Platelet; Future  Hyperlipidemia with target LDL less than 70 -     CBC with Differential/Platelet; Future -     TSH; Future -     Hepatic function panel; Future  Routine general medical examination at a health care facility -     Lipid panel; Future -     HIV Antibody (routine testing w rflx); Future -     Cancel: PSA,  total and free; Future  PSA elevation -     Urinalysis, Routine w reflex microscopic; Future  Prediabetes -     Basic metabolic panel; Future -     Hemoglobin A1c; Future  Abscess of axilla, right- Incision and drainage has been completed.  This looks like hidradenitis suppurativa.  I will empirically treat with doxycycline.  He complains of pain and wants something for pain so I recommended that he take oxycodone as needed.  He will take Phenergan if needed for nausea and vomiting. -     doxycycline (MONODOX) 100 MG capsule; Take 1 capsule (100 mg total) by mouth 2 (two) times daily for 10 days. -     promethazine (PHENERGAN) 12.5 MG tablet; Take 1 tablet (12.5 mg total) by mouth every 6 (six) hours as needed for nausea or vomiting. -     oxyCODONE (OXY IR/ROXICODONE) 5 MG immediate release tablet; Take 1 tablet (5 mg total) by mouth every 4 (four) hours as needed for up to 5 days for severe pain. -     WOUND CULTURE; Future  Cellulitis of right axilla -     doxycycline (MONODOX) 100 MG capsule; Take 1 capsule (100 mg total) by mouth 2 (two) times daily for 10 days. -     promethazine (PHENERGAN) 12.5 MG tablet; Take 1 tablet (12.5 mg total) by mouth every 6 (six) hours as needed for nausea or vomiting. -     WOUND CULTURE; Future  Chronic idiopathic constipation -     Plecanatide (TRULANCE) 3 MG TABS; Take 1 tablet by mouth daily.   I have discontinued Alvester Chou T. Dorsch's Prucalopride Succinate. I am also having him start on doxycycline, promethazine, oxyCODONE, and Trulance. Additionally, I am having him maintain his silodosin, atorvastatin, aspirin EC, and finasteride.  Meds ordered this encounter  Medications  . doxycycline (MONODOX) 100 MG capsule    Sig: Take 1 capsule (100 mg total) by mouth 2 (two) times daily for 10 days.    Dispense:  20 capsule    Refill:  0  . promethazine (PHENERGAN) 12.5 MG tablet    Sig: Take 1 tablet (12.5 mg total) by mouth every 6 (six) hours as  needed for nausea or vomiting.    Dispense:  30 tablet    Refill:  0  . oxyCODONE (OXY IR/ROXICODONE) 5 MG immediate release tablet    Sig:  Take 1 tablet (5 mg total) by mouth every 4 (four) hours as needed for up to 5 days for severe pain.    Dispense:  15 tablet    Refill:  0  . Plecanatide (TRULANCE) 3 MG TABS    Sig: Take 1 tablet by mouth daily.    Dispense:  90 tablet    Refill:  1     Follow-up: Return in about 2 days (around 07/18/2019).  Scarlette Calico, MD

## 2019-07-16 NOTE — Patient Instructions (Signed)
Incision and Drainage Incision and drainage is a surgical procedure to open and drain a fluid-filled sac. The sac may be filled with pus, mucus, or blood. Examples of fluid-filled sacs that may need surgical drainage include cysts, skin infections (abscesses), and red lumps that develop from a ruptured cyst or a small abscess (boils). You may need this procedure if the affected area is large, painful, infected, or not healing well. Tell a health care provider about:  Any allergies you have.  All medicines you are taking, including vitamins, herbs, eye drops, creams, and over-the-counter medicines.  Any problems you or family members have had with anesthetic medicines.  Any blood disorders you have or have had.  Any surgeries you have had.  Any medical conditions you have or have had.  Whether you are pregnant or may be pregnant. What are the risks? Generally, this is a safe procedure. However, problems may occur, including:  Infection.  Bleeding.  Allergic reactions to medicines.  Scarring.  The cyst or abscess returns.  Damage to nerves or vessels. What happens before the procedure? Medicine Ask your health care provider about:  Changing or stopping your regular medicines. This is especially important if you are taking diabetes medicines or blood thinners.  Taking medicines such as aspirin and ibuprofen. These medicines can thin your blood. Do not take these medicines unless your health care provider tells you to take them.  Taking over-the-counter medicines, vitamins, herbs, and supplements. Tests You may have an exam or testing. These may include:  Ultrasound or other imaging tests to see how large or deep the fluid-filled sac is.  Blood tests to check for infection. General instructions  Follow instructions from your health care provider about eating or drinking restrictions.  Plan to have someone take you home from the hospital or clinic.  Ask your health care  provider whether a responsible adult should care for you for at least 24 hours after you leave the hospital or clinic. This is important.  You may get a tetanus shot.  Ask your health care provider: ? How your surgery site will be marked or identified. ? What steps will be taken to help prevent infection. These may include:  Removing hair at the surgery site.  Washing skin with a germ-killing soap.  Receiving antibiotic medicine. What happens during the procedure?   An IV may be inserted into one of your veins.  You will be given one or more of the following: ? A medicine to help you relax (sedative). ? A medicine to numb the area (local anesthetic). ? A medicine to make you fall asleep (general anesthetic).  An incision will be made in the top of the fluid-filled sac.  Pus, blood, and mucus will be squeezed out, and a syringe or tube (drain) may be used to empty more fluid from the sac.  Your health care provider will do one of the following. He or she may: ? Leave the drain in place for several weeks to drain more fluid. ? Stitch open the edges of the incision to make a long-term opening for drainage (marsupialization).  The inside of the sac may be washed out (irrigated) with a sterile solution and packed with gauze before it is covered with a bandage (dressing).  Your health care provider do a culture test of the drainage fluid. The procedure may vary among health care providers and hospitals. What happens after the procedure?  Your blood pressure, heart rate, breathing rate, and blood oxygen level   will be monitored often until you leave the hospital or clinic.  Do not drive for 24 hours if you were given a sedative during your procedure. Summary  Incision and drainage is a surgical procedure to open and drain a fluid-filled sac. The sac may be filled with pus, mucus, or blood.  Before the procedure, you may be given antibiotic medicine to treat or help prevent  infection.  During the procedure, an incision will be made in the top of the fluid-filled sac. Pus, blood, and mucus is squeezed out, and a syringe or tube (drain) may be used to empty more fluid from the sac.  The inside of the sac may be washed out (irrigated) with a sterile solution and packed with gauze before it is covered with a bandage (dressing). This information is not intended to replace advice given to you by your health care provider. Make sure you discuss any questions you have with your health care provider. Document Released: 05/10/2001 Document Revised: 10/15/2018 Document Reviewed: 10/15/2018 Elsevier Patient Education  2020 [employer] Inc[employer]  

## 2019-07-18 ENCOUNTER — Other Ambulatory Visit (INDEPENDENT_AMBULATORY_CARE_PROVIDER_SITE_OTHER): Payer: 59

## 2019-07-18 ENCOUNTER — Encounter: Payer: Self-pay | Admitting: Internal Medicine

## 2019-07-18 ENCOUNTER — Ambulatory Visit (INDEPENDENT_AMBULATORY_CARE_PROVIDER_SITE_OTHER): Payer: 59 | Admitting: Internal Medicine

## 2019-07-18 ENCOUNTER — Other Ambulatory Visit: Payer: Self-pay

## 2019-07-18 VITALS — BP 124/76 | HR 83 | Temp 98.1°F | Resp 16 | Ht 72.0 in | Wt 230.0 lb

## 2019-07-18 DIAGNOSIS — I2510 Atherosclerotic heart disease of native coronary artery without angina pectoris: Secondary | ICD-10-CM

## 2019-07-18 DIAGNOSIS — Z0000 Encounter for general adult medical examination without abnormal findings: Secondary | ICD-10-CM

## 2019-07-18 DIAGNOSIS — E785 Hyperlipidemia, unspecified: Secondary | ICD-10-CM

## 2019-07-18 DIAGNOSIS — R972 Elevated prostate specific antigen [PSA]: Secondary | ICD-10-CM | POA: Diagnosis not present

## 2019-07-18 DIAGNOSIS — R7303 Prediabetes: Secondary | ICD-10-CM

## 2019-07-18 DIAGNOSIS — Z0001 Encounter for general adult medical examination with abnormal findings: Secondary | ICD-10-CM

## 2019-07-18 DIAGNOSIS — L02411 Cutaneous abscess of right axilla: Secondary | ICD-10-CM

## 2019-07-18 DIAGNOSIS — K5904 Chronic idiopathic constipation: Secondary | ICD-10-CM | POA: Diagnosis not present

## 2019-07-18 LAB — CBC WITH DIFFERENTIAL/PLATELET
Basophils Absolute: 0 10*3/uL (ref 0.0–0.1)
Basophils Relative: 1.1 % (ref 0.0–3.0)
Eosinophils Absolute: 0.1 10*3/uL (ref 0.0–0.7)
Eosinophils Relative: 2.9 % (ref 0.0–5.0)
HCT: 42 % (ref 39.0–52.0)
Hemoglobin: 13.8 g/dL (ref 13.0–17.0)
Lymphocytes Relative: 25.1 % (ref 12.0–46.0)
Lymphs Abs: 1.1 10*3/uL (ref 0.7–4.0)
MCHC: 32.9 g/dL (ref 30.0–36.0)
MCV: 84.4 fl (ref 78.0–100.0)
Monocytes Absolute: 0.5 10*3/uL (ref 0.1–1.0)
Monocytes Relative: 11.1 % (ref 3.0–12.0)
Neutro Abs: 2.6 10*3/uL (ref 1.4–7.7)
Neutrophils Relative %: 59.8 % (ref 43.0–77.0)
Platelets: 212 10*3/uL (ref 150.0–400.0)
RBC: 4.97 Mil/uL (ref 4.22–5.81)
RDW: 13.4 % (ref 11.5–15.5)
WBC: 4.3 10*3/uL (ref 4.0–10.5)

## 2019-07-18 LAB — URINALYSIS, ROUTINE W REFLEX MICROSCOPIC
Bilirubin Urine: NEGATIVE
Hgb urine dipstick: NEGATIVE
Ketones, ur: NEGATIVE
Leukocytes,Ua: NEGATIVE
Nitrite: NEGATIVE
RBC / HPF: NONE SEEN (ref 0–?)
Specific Gravity, Urine: 1.015 (ref 1.000–1.030)
Total Protein, Urine: NEGATIVE
Urine Glucose: NEGATIVE
Urobilinogen, UA: 0.2 (ref 0.0–1.0)
pH: 7 (ref 5.0–8.0)

## 2019-07-18 LAB — BASIC METABOLIC PANEL
BUN: 12 mg/dL (ref 6–23)
CO2: 24 mEq/L (ref 19–32)
Calcium: 8.7 mg/dL (ref 8.4–10.5)
Chloride: 106 mEq/L (ref 96–112)
Creatinine, Ser: 1.16 mg/dL (ref 0.40–1.50)
GFR: 78.79 mL/min (ref >60.00)
Glucose, Bld: 114 mg/dL — ABNORMAL HIGH (ref 70–99)
Potassium: 3.7 mEq/L (ref 3.5–5.1)
Sodium: 141 mEq/L (ref 135–145)

## 2019-07-18 LAB — HEPATIC FUNCTION PANEL
ALT: 21 U/L (ref 0–53)
AST: 20 U/L (ref 0–37)
Albumin: 4.2 g/dL (ref 3.5–5.2)
Alkaline Phosphatase: 84 U/L (ref 39–117)
Bilirubin, Direct: 0.1 mg/dL (ref 0.0–0.3)
Total Bilirubin: 0.4 mg/dL (ref 0.2–1.2)
Total Protein: 6.8 g/dL (ref 6.0–8.3)

## 2019-07-18 LAB — LIPID PANEL
Cholesterol: 145 mg/dL (ref 0–200)
HDL: 50.7 mg/dL (ref >39.00)
LDL Cholesterol: 72 mg/dL (ref 0–99)
NonHDL: 94.67
Total CHOL/HDL Ratio: 3
Triglycerides: 114 mg/dL (ref 0.0–149.0)
VLDL: 22.8 mg/dL (ref 0.0–40.0)

## 2019-07-18 LAB — TSH: TSH: 1.1 u[IU]/mL (ref 0.35–4.50)

## 2019-07-18 LAB — HEMOGLOBIN A1C: Hgb A1c MFr Bld: 5.8 % (ref 4.6–6.5)

## 2019-07-18 MED ORDER — LINACLOTIDE 145 MCG PO CAPS: 145.0000 ug | ORAL_CAPSULE | Freq: Every day | ORAL | 0 refills | Status: DC

## 2019-07-18 MED ORDER — ASPIRIN EC 81 MG PO TBEC: 81.0000 mg | DELAYED_RELEASE_TABLET | Freq: Every day | ORAL | 1 refills | Status: DC

## 2019-07-18 MED ORDER — ROSUVASTATIN CALCIUM 20 MG PO TABS: 20.0000 mg | ORAL_TABLET | Freq: Every day | ORAL | 1 refills | Status: DC

## 2019-07-18 NOTE — Progress Notes (Signed)
Subjective:  Patient ID: [name], male    DOB: 1961-10-17  Age: 58 y.o. 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  He continues to complain of severe constipation.  He is not taking Trulance because he complains it was too expensive.  He tells me the abscess in his right arm has improved significantly.  He says the area of pain, redness, and swelling has diminished.  He is taking the antibiotics but has stopped taking the pain medication.  He denies rash, nausea, vomiting, fever, or chills.  He is active and denies any recent episodes of CP or DOE.  He tells me he is due for follow-up with cardiology.  Outpatient Medications Prior to Visit  Medication Sig Dispense Refill   doxycycline (MONODOX) 100 MG capsule Take 1 capsule (100 mg total) by mouth 2 (two) times daily for 10 days. 20 capsule 0   finasteride (PROSCAR) 5 MG tablet TAKE 1 TABLET BY MOUTH EVERY DAY 90 tablet 1   silodosin (RAPAFLO) 8 MG CAPS capsule Take 1 capsule (8 mg total) by mouth daily with breakfast. 90 capsule 1   aspirin EC 81 MG tablet Take 1 tablet (81 mg total) by mouth daily. 90 tablet 3   atorvastatin (LIPITOR) 20 MG tablet Take 1 tablet (20 mg total) by mouth daily. 90 tablet 3   oxyCODONE (OXY IR/ROXICODONE) 5 MG immediate release tablet Take 1 tablet (5 mg total) by mouth every 4 (four) hours as needed for up to 5 days for severe pain. 15 tablet 0   promethazine (PHENERGAN) 12.5 MG tablet Take 1 tablet (12.5 mg total) by mouth every 6 (six) hours as needed for nausea or vomiting. 30 tablet 0   Plecanatide (TRULANCE) 3 MG TABS Take 1 tablet by mouth daily. (Patient not taking: Reported on 07/18/2019) 90 tablet 1   No facility-administered medications prior to visit.     ROS Review of Systems  Constitutional: Negative.  Negative for chills, diaphoresis, fatigue and fever.  HENT: Negative.   Eyes: Negative for  visual disturbance.  Respiratory: Negative for cough, chest tightness, shortness of breath and wheezing.   Cardiovascular: Negative for chest pain, palpitations and leg swelling.  Gastrointestinal: Positive for constipation. Negative for anal bleeding, blood in stool, diarrhea, nausea and vomiting.  Endocrine: Negative.   Genitourinary: Negative.  Negative for difficulty urinating and dysuria.  Musculoskeletal: Negative.  Negative for myalgias.  Skin: Positive for wound. Negative for color change and rash.  Neurological: Negative.  Negative for dizziness, weakness, light-headedness and headaches.  Hematological: Negative for adenopathy. Does not bruise/bleed easily.  Psychiatric/Behavioral: Negative.     Objective:  BP 124/76 (BP Location: Left Arm, Patient Position: Sitting, Cuff Size: Large)    Pulse 83    Temp 98.1 F (36.7 C) (Oral)    Resp 16    Ht 6' (1.829 m)    Wt 230 lb (104.3 kg)    SpO2 99%    BMI 31.19 kg/m   BP Readings from Last 3 Encounters:  07/18/19 124/76  07/16/19 128/80  05/29/18 130/78    Wt Readings from Last 3 Encounters:  07/18/19 230 lb (104.3 kg)  07/16/19 226 lb (102.5 kg)  05/29/18 228 lb 8 oz (103.6 kg)    Physical Exam Vitals signs reviewed.  Constitutional:      Appearance: He is obese. He is not ill-appearing or diaphoretic.  HENT:  Nose: Nose normal.     Mouth/Throat:     Pharynx: Oropharynx is clear.  Eyes:     General: No scleral icterus.    Conjunctiva/sclera: Conjunctivae normal.  Neck:     Musculoskeletal: Normal range of motion. No neck rigidity or muscular tenderness.  Cardiovascular:     Rate and Rhythm: Normal rate and regular rhythm.     Heart sounds: No murmur.     Comments: EKG was not done today because our EKG system is not functioning. Pulmonary:     Effort: Pulmonary effort is normal.     Breath sounds: No stridor. No wheezing, rhonchi or rales.  Abdominal:     General: Abdomen is flat. Bowel sounds are normal.  There is no distension.     Palpations: Abdomen is soft. There is no hepatomegaly or splenomegaly.     Tenderness: There is no abdominal tenderness.     Hernia: No hernia is present.  Genitourinary:    Comments: GU and rectal exam was deferred at his request.  He tells me his urologist did this exam earlier today Musculoskeletal:     Right lower leg: No edema.     Left lower leg: No edema.  Lymphadenopathy:     Cervical: No cervical adenopathy.  Skin:    General: Skin is warm and dry.     Coloration: Skin is not jaundiced or pale.     Comments: The packing is removed from the area of incision and drainage in his right axilla.  The cavity is healing nicely.  There is no tenderness, warmth, erythema, induration, or fluctuance.  Neurological:     General: No focal deficit present.     Mental Status: He is oriented to person, place, and time. Mental status is at baseline.  Psychiatric:        Mood and Affect: Mood normal.        Behavior: Behavior normal.        Thought Content: Thought content normal.        Judgment: Judgment normal.     Lab Results  Component Value Date   WBC 4.3 07/18/2019   HGB 13.8 07/18/2019   HCT 42.0 07/18/2019   PLT 212.0 07/18/2019   GLUCOSE 114 (H) 07/18/2019   CHOL 145 07/18/2019   TRIG 114.0 07/18/2019   HDL 50.70 07/18/2019   LDLDIRECT 132.4 07/29/2008   LDLCALC 72 07/18/2019   ALT 21 07/18/2019   AST 20 07/18/2019   NA 141 07/18/2019   K 3.7 07/18/2019   CL 106 07/18/2019   CREATININE 1.16 07/18/2019   BUN 12 07/18/2019   CO2 24 07/18/2019   TSH 1.10 07/18/2019   PSA 4.4 05/29/2018   HGBA1C 5.8 07/18/2019   MICROALBUR 1.2 03/22/2012    Dg Chest 2 View  Result Date: 11/05/2016 CLINICAL DATA:  Right upper quadrant pain for several days, shortness of Breath EXAM: CHEST  2 VIEW COMPARISON:  08/13/2015 FINDINGS: The heart size and mediastinal contours are within normal limits. Both lungs are clear. The visualized skeletal structures are  unremarkable. IMPRESSION: No active cardiopulmonary disease. Electronically Signed   By: Inez Catalina M.D.   On: 11/05/2016 17:41   Ct Abdomen Pelvis W Contrast  Result Date: 11/05/2016 CLINICAL DATA:  Right upper quadrant pain radiating through to the back. Bloating for 1 week. Constipation. Fever and nausea. EXAM: CT ABDOMEN AND PELVIS WITH CONTRAST TECHNIQUE: Multidetector CT imaging of the abdomen and pelvis was performed using the standard protocol following  bolus administration of intravenous contrast. CONTRAST:  179mL ISOVUE-300 IOPAMIDOL (ISOVUE-300) INJECTION 61% COMPARISON:  March 22, 2015 FINDINGS: Lower chest: No acute abnormality. Hepatobiliary: No focal liver abnormality is seen. No gallstones, gallbladder wall thickening, or biliary dilatation. Pancreas: Unremarkable. No pancreatic ductal dilatation or surrounding inflammatory changes. Spleen: Normal in size without focal abnormality. Adrenals/Urinary Tract: Adrenal glands are unremarkable. Kidneys are normal, without renal calculi, focal lesion, or hydronephrosis. Bladder is unremarkable. Stomach/Bowel: The stomach is normal in appearance. The small bowel obstruction seen on the previous study has resolved. Visualized small bowel is normal today. The patient is status post partial colonic resection with anastomosis in the right side of the abdomen. No colonic inflammation is identified today. Mild fecal loading in the colon. No obstruction identified. Vascular/Lymphatic: No significant vascular findings are present. No enlarged abdominal or pelvic lymph nodes. Reproductive: Prostate is unremarkable. Other: Fat containing ventral hernia. No other additional abnormalities. Musculoskeletal: No acute or significant osseous findings. IMPRESSION: 1. No acute abnormalities.  Mild fecal loading in the colon. Electronically Signed   By: Dorise Bullion III M.D   On: 11/05/2016 18:16    Adequate    SOURCE: WOUND   STATUS: FINAL   GRAM STAIN: No white  blood cells seen No epithelial cells seen No organisms seen   ISOLATE 1: Staphylococcus lugdunensis   Comment: Moderate growth of Staphylococcus lugdunensis  Resulting Agency [facility]  Susceptibility   Staphylococcus lugdunensis    AEROBIC CULT, GRAM STAIN POSITIVE 1    CIPROFLOXACIN <=0.5  Sensitive    CLINDAMYCIN <=0.25  Sensitive    ERYTHROMYCIN <=0.25  Sensitive    GENTAMICIN <=0.5  Sensitive    LEVOFLOXACIN 0.25  Sensitive    OXACILLIN 2  Sensitive1    TETRACYCLINE <=1  Sensitive    TRIMETH/SULFA <=10  Sensitive2    VANCOMYCIN 1  Sensitive          Assessment & Plan:   Nashaun was seen today for annual exam, coronary artery disease and hyperlipidemia.  Diagnoses and all orders for this visit:  Abscess of axilla, right- The culture is positive for a staph species that is sensitive to doxycycline.  I have asked him to complete the course of doxycycline and to let me know if there are any new or worsening symptoms.  Routine general medical examination at a health care facility- Exam completed, labs reviewed, vaccines reviewed - he refused to receive a flu vaccine today, colon cancer screening is up-to-date, he saw his urologist earlier today, patient education material was given.  Hyperlipidemia with target LDL less than 70- I have asked him to take a statin for CV risk reduction. -     rosuvastatin (CRESTOR) 20 MG tablet; Take 1 tablet (20 mg total) by mouth daily.  Atherosclerosis of native coronary artery of native heart without angina pectoris- Will continue to work on risk factor modifications. -     aspirin EC 81 MG tablet; Take 1 tablet (81 mg total) by mouth daily. -     Ambulatory referral to Cardiology -     rosuvastatin (CRESTOR) 20 MG tablet; Take 1 tablet (20 mg total) by mouth daily.  Chronic idiopathic constipation- Labs are negative for secondary causes.  I gave him samples of Linzess. -     linaclotide (LINZESS) 145 MCG CAPS capsule; Take 1 capsule (145 mcg  total) by mouth daily before breakfast.   I have discontinued Alvester Chou T. Drudge's atorvastatin, promethazine, oxyCODONE, and Trulance. I am also having him start  on linaclotide and rosuvastatin. Additionally, I am having him maintain his silodosin, finasteride, doxycycline, and aspirin EC.  Meds ordered this encounter  Medications   aspirin EC 81 MG tablet    Sig: Take 1 tablet (81 mg total) by mouth daily.    Dispense:  90 tablet    Refill:  1   linaclotide (LINZESS) 145 MCG CAPS capsule    Sig: Take 1 capsule (145 mcg total) by mouth daily before breakfast.    Dispense:  84 capsule    Refill:  0   rosuvastatin (CRESTOR) 20 MG tablet    Sig: Take 1 tablet (20 mg total) by mouth daily.    Dispense:  90 tablet    Refill:  1     Follow-up: Return in about 6 months (around 01/18/2020).  Scarlette Calico, MD

## 2019-07-18 NOTE — Patient Instructions (Signed)

## 2019-07-19 LAB — HIV ANTIBODY (ROUTINE TESTING W REFLEX): HIV 1&2 Ab, 4th Generation: NONREACTIVE

## 2019-07-20 LAB — WOUND CULTURE
MICRO NUMBER:: 783493
SPECIMEN QUALITY:: ADEQUATE

## 2019-08-12 ENCOUNTER — Encounter: Payer: Self-pay | Admitting: Family Medicine

## 2019-08-12 ENCOUNTER — Ambulatory Visit (INDEPENDENT_AMBULATORY_CARE_PROVIDER_SITE_OTHER)
Admission: RE | Admit: 2019-08-12 | Discharge: 2019-08-12 | Disposition: A | Discharge status: Home / Self Care | Payer: 59 | Source: Ambulatory Visit | Attending: Family Medicine | Admitting: Family Medicine

## 2019-08-12 ENCOUNTER — Ambulatory Visit (INDEPENDENT_AMBULATORY_CARE_PROVIDER_SITE_OTHER): Payer: 59 | Admitting: Family Medicine

## 2019-08-12 ENCOUNTER — Ambulatory Visit: Payer: Self-pay

## 2019-08-12 ENCOUNTER — Other Ambulatory Visit: Payer: Self-pay

## 2019-08-12 VITALS — BP 124/84 | HR 91 | Ht 72.0 in | Wt 225.0 lb

## 2019-08-12 DIAGNOSIS — M25562 Pain in left knee: Secondary | ICD-10-CM | POA: Diagnosis not present

## 2019-08-12 DIAGNOSIS — Z23 Encounter for immunization: Secondary | ICD-10-CM

## 2019-08-12 DIAGNOSIS — M25561 Pain in right knee: Secondary | ICD-10-CM

## 2019-08-12 DIAGNOSIS — S83249A Other tear of medial meniscus, current injury, unspecified knee, initial encounter: Secondary | ICD-10-CM | POA: Insufficient documentation

## 2019-08-12 DIAGNOSIS — G8929 Other chronic pain: Secondary | ICD-10-CM

## 2019-08-12 MED ORDER — DICLOFENAC SODIUM 2 % TD SOLN: 2.0000 g | Freq: Two times a day (BID) | TRANSDERMAL | 3 refills | Status: DC

## 2019-08-12 NOTE — Patient Instructions (Signed)
Good to see you.  Ice 20 minutes 2 times daily. Usually after activity and before bed. Exercises 3 times a week.  pennsaid pinkie amount topically 2 times daily as needed.  Drop weight 50%, increase 10% each week Turmeric 500mg  daily  Tart cherry extract 1200mg  at night Vitamin D 2000 IU daily  See me again in 5-6 weeks

## 2019-08-12 NOTE — Progress Notes (Signed)
[name] [address] [place], [place] [address] Phone: [phone] Subjective:   Fontaine No, am serving as a scribe for Dr. Hulan Saas.  I'm seeing this patient by the request  of:  Janith Lima, MD   CC: Knee pain and instability  [id number]  [name] is a 58 y.o. male coming in with complaint of bilateral knee pain. Pain starts with knee extension. Has to sit down, flex his knee and then he can walk without an antalgic gait. Right knee> left knee. Patient notices similar sensation with deadlifting at gym. Pain occurs medially for the past 3 weeks.  Patient attempts to try to workout on a regular basis.  Usually works at [employer] and does do significant lifting and squatting 400 pounds.  Patient has not noticed any swelling, states that the knee sometimes feel full and unstable.  His main concerns.    Past Medical History:  Diagnosis Date  . Cancer ([place])   . Chronic rhinitis   . Coronary atherosclerosis of unspecified type of vessel, native or graft   . Dysmetabolic syndrome X   . Erectile dysfunction   . Hypertrophy of prostate with urinary obstruction and other lower urinary tract symptoms (LUTS)   . Obstructive sleep apnea (adult) (pediatric)    no CPAP, has lost weight  . Problems related to high-risk sexual behavior   . Pure hypercholesterolemia   . Sickle cell anemia ([facility])   . Tobacco use disorder    Past Surgical History:  Procedure Laterality Date  . COSMETIC SURGERY    . HERNIA REPAIR     UHR  AT [facility]  <5 YRS AGO   . INCISION AND DRAINAGE ABSCESS / HEMATOMA OF BURSA / KNEE / THIGH     I&D of complex abscess,left axilla. I&D of large infected pilonidal abscess  . LAPAROSCOPIC PARTIAL COLECTOMY N/A 03/10/2015   Procedure: LAPAROSCOPIC ASSISTED RIGHT COLECTOMY;  Surgeon: Rolm Bookbinder, MD;  Location: [facility];  Service: General;  Laterality: N/A;  . MASS EXCISION N/A 10/19/2015   Procedure: EXCISION BACK MASS;   Surgeon: Rolm Bookbinder, MD;  Location: [facility];  Service: General;  Laterality: N/A;  . PTCA  2010   Social History   Socioeconomic History  . Marital status: Divorced    Spouse name: Not on file  . Number of children: 1  . Years of education: Not on file  . Highest education level: Not on file  Occupational History  . Not on file  Social Needs  . Financial resource strain: Not on file  . Food insecurity    Worry: Not on file    Inability: Not on file  . Transportation needs    Medical: Not on file    Non-medical: Not on file  Tobacco Use  . Smoking status: Current Every Day Smoker    Packs/day: 0.50    Years: 25.00    Pack years: 12.50    Types: Cigarettes  . Smokeless tobacco: Never Used  Substance and Sexual Activity  . Alcohol use: Yes    Alcohol/week: 0.0 standard drinks    Comment: weekends-2-3 drinks  . Drug use: No  . Sexual activity: Yes    Birth control/protection: Condom  Lifestyle  . Physical activity    Days per week: Not on file    Minutes per session: Not on file  . Stress: Not on file  Relationships  . Social connections    Talks  on phone: Not on file    Gets together: Not on file    Attends religious service: Not on file    Active member of club or organization: Not on file    Attends meetings of clubs or organizations: Not on file    Relationship status: Not on file  Other Topics Concern  . Not on file  Social History Narrative  . Not on file   Allergies  Allergen Reactions  . Penicillins Hives and Itching   Family History  Problem Relation Age of Onset  . Breast cancer Mother   . Colon cancer Maternal Grandmother   . Stroke Maternal Grandmother        Over 7  . Colon cancer Maternal Grandfather   . Prostate cancer Maternal Grandfather   . Colon cancer Other        1st degree relative<60  . Heart attack Father        Over 28  . Esophageal cancer Neg Hx   . Stomach cancer Neg Hx   . Rectal cancer Neg Hx       Current Outpatient Medications (Cardiovascular):  .  rosuvastatin (CRESTOR) 20 MG tablet, Take 1 tablet (20 mg total) by mouth daily.   Current Outpatient Medications (Analgesics):  .  aspirin EC 81 MG tablet, Take 1 tablet (81 mg total) by mouth daily.   Current Outpatient Medications (Other):  .  finasteride (PROSCAR) 5 MG tablet, TAKE 1 TABLET BY MOUTH EVERY DAY .  linaclotide (LINZESS) 145 MCG CAPS capsule, Take 1 capsule (145 mcg total) by mouth daily before breakfast. .  silodosin (RAPAFLO) 8 MG CAPS capsule, Take 1 capsule (8 mg total) by mouth daily with breakfast. .  Diclofenac Sodium 2 % SOLN, Place 2 g onto the skin 2 (two) times daily.    Past medical history, social, surgical and family history all reviewed in electronic medical record.  No pertanent information unless stated regarding to the chief complaint.   Review of Systems:  No headache, visual changes, nausea, vomiting, diarrhea, constipation, dizziness, abdominal pain, skin rash, fevers, chills, night sweats, weight loss, swollen lymph nodes, body aches, joint swelling,  chest pain, shortness of breath, mood changes.  Past medical history  Objective  Blood pressure 124/84, pulse 91, height 6' (1.829 m), weight 225 lb (102.1 kg), SpO2 97 %.    General: No apparent distress alert and oriented x3 mood and affect normal, dressed appropriately.  HEENT: Pupils equal, extraocular movements intact  Respiratory: Patient's speak in full sentences and does not appear short of breath  Cardiovascular: No lower extremity edema, non tender, no erythema  Skin: Warm dry intact with no signs of infection or rash on extremities or on axial skeleton.  Abdomen: Soft nontender  Neuro: Cranial nerves II through XII are intact, neurovascularly intact in all extremities with 2+ DTRs and 2+ pulses.  Lymph: No lymphadenopathy of posterior or anterior cervical chain or axillae bilaterally.  Gait normal with good balance and  coordination.  MSK:  Non tender with full range of motion and good stability and symmetric strength and tone of shoulders, elbows, wrist, hip and ankles bilaterally.   Knee exam bilaterally does have tenderness to palpation over the paraspinal musculature more the medial compartment.  Patient does have mild positive crepitus.  No significant instability though noted.  Positive McMurray's bilaterally.   Limited musculoskeletal ultrasound was performed and interpreted by Lyndal Pulley  Limited ultrasound of patient's knee shows the patient does have degenerative  meniscal tears noted with mild to moderate osteoarthritic changes of the medial compartment.  Right knee does have a trace effusion of the patellofemoral joint with a chronic synovitis.    Impression and Recommendations:     This case required medical decision making of moderate complexity. The above documentation has been reviewed and is accurate and complete Lyndal Pulley, DO       Note: This dictation was prepared with Dragon dictation along with smaller phrase technology. Any transcriptional errors that result from this process are unintentional.

## 2019-08-12 NOTE — Assessment & Plan Note (Signed)
Bilateral right greater than left, patient does have a Baker's cyst on the right side as well.  We discussed compression, home exercises, patient work with her for a trainer to learn this in greater detail.  X-rays ordered today to further evaluate for any underlying arthritis that is also likely contributing.  Patient is to increase activity slowly.  Follow-up again in 4 to 8 weeks

## 2019-08-26 NOTE — Progress Notes (Signed)
[name] [address] [place], [place] [address] Phone: [phone] Subjective:    I'm seeing this patient by the request  of:    CC: Follow-up knee pain  [id number]   08/12/2019 Bilateral right greater than left, patient does have a Baker's cyst on the right side as well.  We discussed compression, home exercises, patient work with her for a trainer to learn this in greater detail.  X-rays ordered today to further evaluate for any underlying arthritis that is also likely contributing.  Patient is to increase activity slowly.  Follow-up again in 4 to 8 weeks  Update 08/27/2019 [name] is a 58 y.o. male coming in with complaint of bilateral knee pain. Patient states that his right knee is worse than his left. Pain is effecting his gait. Was moving a light table the other day and was in pain when he put all his weight on the right knee. Left knee is still painful as well but not as bad as right side.       Past Medical History:  Diagnosis Date  . Cancer ([place])   . Chronic rhinitis   . Coronary atherosclerosis of unspecified type of vessel, native or graft   . Dysmetabolic syndrome X   . Erectile dysfunction   . Hypertrophy of prostate with urinary obstruction and other lower urinary tract symptoms (LUTS)   . Obstructive sleep apnea (adult) (pediatric)    no CPAP, has lost weight  . Problems related to high-risk sexual behavior   . Pure hypercholesterolemia   . Sickle cell anemia (HCC)   . Tobacco use disorder    Past Surgical History:  Procedure Laterality Date  . COSMETIC SURGERY    . HERNIA REPAIR     UHR  AT [facility]  <5 YRS AGO   . INCISION AND DRAINAGE ABSCESS / HEMATOMA OF BURSA / KNEE / THIGH     I&D of complex abscess,left axilla. I&D of large infected pilonidal abscess  . LAPAROSCOPIC PARTIAL COLECTOMY N/A 03/10/2015   Procedure: LAPAROSCOPIC ASSISTED RIGHT COLECTOMY;  Surgeon: Scott Bookbinder, MD;  Location: [facility];  Service:  General;  Laterality: N/A;  . MASS EXCISION N/A 10/19/2015   Procedure: EXCISION BACK MASS;  Surgeon: Scott Bookbinder, MD;  Location: [facility];  Service: General;  Laterality: N/A;  . PTCA  2010   Social History   Socioeconomic History  . Marital status: Divorced    Spouse name: Not on file  . Number of children: 1  . Years of education: Not on file  . Highest education level: Not on file  Occupational History  . Not on file  Social Needs  . Financial resource strain: Not on file  . Food insecurity    Worry: Not on file    Inability: Not on file  . Transportation needs    Medical: Not on file    Non-medical: Not on file  Tobacco Use  . Smoking status: Current Every Day Smoker    Packs/day: 0.50    Years: 25.00    Pack years: 12.50    Types: Cigarettes  . Smokeless tobacco: Never Used  Substance and Sexual Activity  . Alcohol use: Yes    Alcohol/week: 0.0 standard drinks    Comment: weekends-2-3 drinks  . Drug use: No  . Sexual activity: Yes    Birth control/protection: Condom  Lifestyle  . Physical activity    Days per week: Not on file  Minutes per session: Not on file  . Stress: Not on file  Relationships  . Social Herbalist on phone: Not on file    Gets together: Not on file    Attends religious service: Not on file    Active member of club or organization: Not on file    Attends meetings of clubs or organizations: Not on file    Relationship status: Not on file  Other Topics Concern  . Not on file  Social History Narrative  . Not on file   Allergies  Allergen Reactions  . Penicillins Hives and Itching   Family History  Problem Relation Age of Onset  . Breast cancer Mother   . Colon cancer Maternal Grandmother   . Stroke Maternal Grandmother        Over 17  . Colon cancer Maternal Grandfather   . Prostate cancer Maternal Grandfather   . Colon cancer Other        1st degree relative<60  . Heart attack Father         Over 42  . Esophageal cancer Neg Hx   . Stomach cancer Neg Hx   . Rectal cancer Neg Hx      Current Outpatient Medications (Cardiovascular):  .  rosuvastatin (CRESTOR) 20 MG tablet, Take 1 tablet (20 mg total) by mouth daily.   Current Outpatient Medications (Analgesics):  .  aspirin EC 81 MG tablet, Take 1 tablet (81 mg total) by mouth daily.   Current Outpatient Medications (Other):  Marland Kitchen  Diclofenac Sodium 2 % SOLN, Place 2 g onto the skin 2 (two) times daily. .  finasteride (PROSCAR) 5 MG tablet, TAKE 1 TABLET BY MOUTH EVERY DAY .  linaclotide (LINZESS) 145 MCG CAPS capsule, Take 1 capsule (145 mcg total) by mouth daily before breakfast. .  silodosin (RAPAFLO) 8 MG CAPS capsule, Take 1 capsule (8 mg total) by mouth daily with breakfast.    Past medical history, social, surgical and family history all reviewed in electronic medical record.  No pertanent information unless stated regarding to the chief complaint.   Review of Systems:  No headache, visual changes, nausea, vomiting, diarrhea, constipation, dizziness, abdominal pain, skin rash, fevers, chills, night sweats, weight loss, swollen lymph nodes, body aches, joint swelling, muscle aches, chest pain, shortness of breath, mood changes.   Objective  There were no vitals taken for this visit. Systems examined below as of    General: No apparent distress alert and oriented x3 mood and affect normal, dressed appropriately.  HEENT: Pupils equal, extraocular movements intact  Respiratory: Patient's speak in full sentences and does not appear short of breath  Cardiovascular: No lower extremity edema, non tender, no erythema  Skin: Warm dry intact with no signs of infection or rash on extremities or on axial skeleton.  Abdomen: Soft nontender  Neuro: Cranial nerves II through XII are intact, neurovascularly intact in all extremities with 2+ DTRs and 2+ pulses.  Lymph: No lymphadenopathy of posterior or anterior cervical chain or  axillae bilaterally.  Gait normal with good balance and coordination.  MSK:  Non tender with full range of motion and good stability and symmetric strength and tone of shoulders, elbows, wrist, hip and ankles bilaterally.  Knee: Normal to inspection with no erythema or effusion or obvious bony abnormalities. Palpation normal with no warmth, joint line tenderness, patellar tenderness, or condyle tenderness. ROM full in flexion and extension and lower leg rotation. Ligaments with solid consistent endpoints including ACL,  PCL, LCL, MCL. Negative Mcmurray's, Apley's, and Thessalonian tests. Non painful patellar compression. Patellar glide without crepitus. Patellar and quadriceps tendons unremarkable. Hamstring and quadriceps strength is normal.      Impression and Recommendations:     This case required medical decision making of moderate complexity. The above documentation has been reviewed and is accurate and complete Lyndal Pulley, DO       Note: This dictation was prepared with Dragon dictation along with smaller phrase technology. Any transcriptional errors that result from this process are unintentional.

## 2019-08-27 ENCOUNTER — Encounter: Payer: Self-pay | Admitting: Family Medicine

## 2019-08-27 ENCOUNTER — Other Ambulatory Visit: Payer: Self-pay

## 2019-08-27 ENCOUNTER — Ambulatory Visit (INDEPENDENT_AMBULATORY_CARE_PROVIDER_SITE_OTHER): Payer: 59 | Admitting: Family Medicine

## 2019-08-27 DIAGNOSIS — M170 Bilateral primary osteoarthritis of knee: Secondary | ICD-10-CM | POA: Insufficient documentation

## 2019-08-27 DIAGNOSIS — S83241D Other tear of medial meniscus, current injury, right knee, subsequent encounter: Secondary | ICD-10-CM

## 2019-08-27 NOTE — Assessment & Plan Note (Signed)
Injected today, tolerated the procedure well.  Discussed icing regimen and home exercise, patient does have stool displacement of the meniscus noted with a Baker's cyst.  Discussed with patient in great length.  Patient will try this and follow-up with me again in 4 to 8 weeks.  Worsening symptoms will need MRI secondary to the instability.

## 2019-08-27 NOTE — Patient Instructions (Signed)
Injected both knees today Message in a few weeks and let us know if you are not better will get MRI See me again in 4-5 weeks

## 2019-08-27 NOTE — Assessment & Plan Note (Signed)
Bilateral injections given today.  Patient did have a meniscal tear on the right side.  X-rays do not show any significant arthritic changes.  Mild to moderate.  Patient should respond fairly well to this.  Worsening instability no secondary to the meniscus tear may need advanced imaging.  Patient will follow-up with me again in 4 to 6 weeks

## 2019-09-16 NOTE — Progress Notes (Signed)
[name] [address] [place], [place] [address] Phone: [phone] Subjective:   I [name] am serving as a Education administrator for Dr. Hulan Saas.  I'm seeing this patient by the request  of:    CC: Neck pain, pain follow-up  [id number]   08/27/2019 Bilateral injections given today.  Patient did have a meniscal tear on the right side.  X-rays do not show any significant arthritic changes.  Mild to moderate.  Patient should respond fairly well to this.  Worsening instability no secondary to the meniscus tear may need advanced imaging.  Patient will follow-up with me again in 4 to 6 weeks  Injected today, tolerated the procedure well.  Discussed icing regimen and home exercise, patient does have stool displacement of the meniscus noted with a Baker's cyst.  Discussed with patient in great length.  Patient will try this and follow-up with me again in 4 to 8 weeks.  Worsening symptoms will need MRI secondary to the instability.  09/17/2019 [name] is a 58 y.o. male coming in with complaint of right knee pain. Also right sided neck pain. Pain with flexion and extension. Believes he strained the muscles in his neck working out. Received knee injection last visit. Knee is painful at night but not with activity.   Regarding patient's knee did have a meniscal tear but is having no catching sensation.  Patient feels like he has made significant progress.     Past Medical History:  Diagnosis Date  . Cancer ([facility])   . Chronic rhinitis   . Coronary atherosclerosis of unspecified type of vessel, native or graft   . Dysmetabolic syndrome X   . Erectile dysfunction   . Hypertrophy of prostate with urinary obstruction and other lower urinary tract symptoms (LUTS)   . Obstructive sleep apnea (adult) (pediatric)    no CPAP, has lost weight  . Problems related to high-risk sexual behavior   . Pure hypercholesterolemia   . Sickle cell anemia (HCC)   . Tobacco use  disorder    Past Surgical History:  Procedure Laterality Date  . COSMETIC SURGERY    . HERNIA REPAIR     UHR  AT [facility]  <5 YRS AGO   . INCISION AND DRAINAGE ABSCESS / HEMATOMA OF BURSA / KNEE / THIGH     I&D of complex abscess,left axilla. I&D of large infected pilonidal abscess  . LAPAROSCOPIC PARTIAL COLECTOMY N/A 03/10/2015   Procedure: LAPAROSCOPIC ASSISTED RIGHT COLECTOMY;  Surgeon: Rolm Bookbinder, MD;  Location: [facility];  Service: General;  Laterality: N/A;  . MASS EXCISION N/A 10/19/2015   Procedure: EXCISION BACK MASS;  Surgeon: Rolm Bookbinder, MD;  Location: [facility];  Service: General;  Laterality: N/A;  . PTCA  2010   Social History   Socioeconomic History  . Marital status: Divorced    Spouse name: Not on file  . Number of children: 1  . Years of education: Not on file  . Highest education level: Not on file  Occupational History  . Not on file  Social Needs  . Financial resource strain: Not on file  . Food insecurity    Worry: Not on file    Inability: Not on file  . Transportation needs    Medical: Not on file    Non-medical: Not on file  Tobacco Use  . Smoking status: Current Every Day Smoker    Packs/day: 0.50    Years: 25.00  Pack years: 12.50    Types: Cigarettes  . Smokeless tobacco: Never Used  Substance and Sexual Activity  . Alcohol use: Yes    Alcohol/week: 0.0 standard drinks    Comment: weekends-2-3 drinks  . Drug use: No  . Sexual activity: Yes    Birth control/protection: Condom  Lifestyle  . Physical activity    Days per week: Not on file    Minutes per session: Not on file  . Stress: Not on file  Relationships  . Social Herbalist on phone: Not on file    Gets together: Not on file    Attends religious service: Not on file    Active member of club or organization: Not on file    Attends meetings of clubs or organizations: Not on file    Relationship status: Not on file  Other Topics Concern  .  Not on file  Social History Narrative  . Not on file   Allergies  Allergen Reactions  . Penicillins Hives and Itching   Family History  Problem Relation Age of Onset  . Breast cancer Mother   . Colon cancer Maternal Grandmother   . Stroke Maternal Grandmother        Over 84  . Colon cancer Maternal Grandfather   . Prostate cancer Maternal Grandfather   . Colon cancer Other        1st degree relative<60  . Heart attack Father        Over 54  . Esophageal cancer Neg Hx   . Stomach cancer Neg Hx   . Rectal cancer Neg Hx      Current Outpatient Medications (Cardiovascular):  .  rosuvastatin (CRESTOR) 20 MG tablet, Take 1 tablet (20 mg total) by mouth daily.   Current Outpatient Medications (Analgesics):  .  aspirin EC 81 MG tablet, Take 1 tablet (81 mg total) by mouth daily.   Current Outpatient Medications (Other):  Marland Kitchen  Diclofenac Sodium 2 % SOLN, Place 2 g onto the skin 2 (two) times daily. .  finasteride (PROSCAR) 5 MG tablet, TAKE 1 TABLET BY MOUTH EVERY DAY .  linaclotide (LINZESS) 145 MCG CAPS capsule, Take 1 capsule (145 mcg total) by mouth daily before breakfast. .  silodosin (RAPAFLO) 8 MG CAPS capsule, Take 1 capsule (8 mg total) by mouth daily with breakfast.    Past medical history, social, surgical and family history all reviewed in electronic medical record.  No pertanent information unless stated regarding to the chief complaint.   Review of Systems:  No headache, visual changes, nausea, vomiting, diarrhea, constipation, dizziness, abdominal pain, skin rash, fevers, chills, night sweats, weight loss, swollen lymph nodes, body aches, joint swelling, muscle aches, chest pain, shortness of breath, mood changes.   Objective  There were no vitals taken for this visit. Systems examined below as of    General: No apparent distress alert and oriented x3 mood and affect normal, dressed appropriately.  HEENT: Pupils equal, extraocular movements intact   Respiratory: Patient's speak in full sentences and does not appear short of breath  Cardiovascular: No lower extremity edema, non tender, no erythema  Skin: Warm dry intact with no signs of infection or rash on extremities or on axial skeleton.  Abdomen: Soft nontender  Neuro: Cranial nerves II through XII are intact, neurovascularly intact in all extremities with 2+ DTRs and 2+ pulses.  Lymph: No lymphadenopathy of posterior or anterior cervical chain or axillae bilaterally.  Gait normal with good balance and  coordination.  MSK:  tender with full range of motion and good stability and symmetric strength and tone of shoulders, elbows, wrist, hip, knee and ankles bilaterally.  Patient's knee exam mild tenderness over the medial meniscus Neck exam does have significant tightness of the trapezius in the laboratory on the left side.  Multiple trigger points noted.  Negative Spurling's.  Near full range of motion.  Pain noted with flexion     Impression and Recommendations:     This case required medical decision making of moderate complexity. The above documentation has been reviewed and is accurate and complete Lyndal Pulley, DO       Note: This dictation was prepared with Dragon dictation along with smaller phrase technology. Any transcriptional errors that result from this process are unintentional.

## 2019-09-17 ENCOUNTER — Other Ambulatory Visit: Payer: Self-pay

## 2019-09-17 ENCOUNTER — Encounter: Payer: Self-pay | Admitting: Family Medicine

## 2019-09-17 ENCOUNTER — Ambulatory Visit (INDEPENDENT_AMBULATORY_CARE_PROVIDER_SITE_OTHER): Payer: 59 | Admitting: Family Medicine

## 2019-09-17 DIAGNOSIS — M62838 Other muscle spasm: Secondary | ICD-10-CM | POA: Diagnosis not present

## 2019-09-17 MED ORDER — PREDNISONE 50 MG PO TABS: ORAL_TABLET | ORAL | 0 refills | Status: DC

## 2019-09-17 MED ORDER — TIZANIDINE HCL 4 MG PO CAPS: ORAL_CAPSULE | ORAL | 0 refills | Status: DC

## 2019-09-17 NOTE — Patient Instructions (Signed)
Good to see you Exercise 3 times a week Zanaflex sent in Prednisone sent in  Ice twice a day after activity Keep hands within peripheral vision   Drop weight to 20% See me again in 4 weeks

## 2019-09-17 NOTE — Assessment & Plan Note (Signed)
Spasm noted on the left side.  Discussed with patient about icing regimen, home exercise, discussed proper lifting mechanics, which to avoid.  Patient is to increase activity slowly.  Patient will follow-up again 4 to 8 weeks

## 2019-09-24 ENCOUNTER — Ambulatory Visit: Payer: 59 | Admitting: Family Medicine

## 2019-10-08 ENCOUNTER — Ambulatory Visit: Payer: 59 | Admitting: Internal Medicine

## 2019-10-10 ENCOUNTER — Encounter: Payer: Self-pay | Admitting: Internal Medicine

## 2019-10-11 ENCOUNTER — Ambulatory Visit (INDEPENDENT_AMBULATORY_CARE_PROVIDER_SITE_OTHER): Payer: 59 | Admitting: Family Medicine

## 2019-10-11 ENCOUNTER — Encounter: Payer: Self-pay | Admitting: Family Medicine

## 2019-10-11 ENCOUNTER — Other Ambulatory Visit: Payer: Self-pay

## 2019-10-11 ENCOUNTER — Other Ambulatory Visit (INDEPENDENT_AMBULATORY_CARE_PROVIDER_SITE_OTHER): Payer: 59

## 2019-10-11 ENCOUNTER — Ambulatory Visit (INDEPENDENT_AMBULATORY_CARE_PROVIDER_SITE_OTHER)
Admission: RE | Admit: 2019-10-11 | Discharge: 2019-10-11 | Disposition: A | Discharge status: Home / Self Care | Payer: 59 | Source: Ambulatory Visit | Attending: Family Medicine | Admitting: Family Medicine

## 2019-10-11 VITALS — BP 122/86 | HR 58 | Ht 72.0 in | Wt 231.0 lb

## 2019-10-11 DIAGNOSIS — M5412 Radiculopathy, cervical region: Secondary | ICD-10-CM | POA: Diagnosis not present

## 2019-10-11 DIAGNOSIS — M542 Cervicalgia: Secondary | ICD-10-CM | POA: Diagnosis not present

## 2019-10-11 DIAGNOSIS — M2550 Pain in unspecified joint: Secondary | ICD-10-CM | POA: Diagnosis not present

## 2019-10-11 LAB — C-REACTIVE PROTEIN: CRP: <1 mg/dL (ref 0.5–20.0)

## 2019-10-11 LAB — CBC WITH DIFFERENTIAL/PLATELET
Basophils Absolute: 0 10*3/uL (ref 0.0–0.1)
Basophils Relative: 1 % (ref 0.0–3.0)
Eosinophils Absolute: 0.1 10*3/uL (ref 0.0–0.7)
Eosinophils Relative: 3.1 % (ref 0.0–5.0)
HCT: 42.5 % (ref 39.0–52.0)
Hemoglobin: 14.3 g/dL (ref 13.0–17.0)
Lymphocytes Relative: 25.2 % (ref 12.0–46.0)
Lymphs Abs: 1.2 10*3/uL (ref 0.7–4.0)
MCHC: 33.6 g/dL (ref 30.0–36.0)
MCV: 84 fl (ref 78.0–100.0)
Monocytes Absolute: 0.5 10*3/uL (ref 0.1–1.0)
Monocytes Relative: 10 % (ref 3.0–12.0)
Neutro Abs: 2.9 10*3/uL (ref 1.4–7.7)
Neutrophils Relative %: 60.7 % (ref 43.0–77.0)
Platelets: 248 10*3/uL (ref 150.0–400.0)
RBC: 5.06 Mil/uL (ref 4.22–5.81)
RDW: 13.5 % (ref 11.5–15.5)
WBC: 4.7 10*3/uL (ref 4.0–10.5)

## 2019-10-11 LAB — SEDIMENTATION RATE: Sed Rate: 10 mm/hr (ref 0–20)

## 2019-10-11 LAB — FERRITIN: Ferritin: 66.1 ng/mL (ref 22.0–322.0)

## 2019-10-11 LAB — TSH: TSH: 0.79 u[IU]/mL (ref 0.35–4.50)

## 2019-10-11 LAB — IBC PANEL
Iron: 79 ug/dL (ref 42–165)
Saturation Ratios: 18.9 % — ABNORMAL LOW (ref 20.0–50.0)
Transferrin: 298 mg/dL (ref 212.0–360.0)

## 2019-10-11 LAB — VITAMIN D 25 HYDROXY (VIT D DEFICIENCY, FRACTURES): VITD: 40.75 ng/mL (ref 30.00–100.00)

## 2019-10-11 LAB — TESTOSTERONE: Testosterone: 312.55 ng/dL (ref 300.00–890.00)

## 2019-10-11 LAB — URIC ACID: Uric Acid, Serum: 3.9 mg/dL — ABNORMAL LOW (ref 4.0–7.8)

## 2019-10-11 MED ORDER — TIZANIDINE HCL 4 MG PO TABS: 4.0000 mg | ORAL_TABLET | Freq: Every day | ORAL | 0 refills | Status: DC

## 2019-10-11 MED ORDER — PREDNISONE 50 MG PO TABS: ORAL_TABLET | ORAL | 0 refills | Status: DC

## 2019-10-11 MED ORDER — GABAPENTIN 100 MG PO CAPS: 200.0000 mg | ORAL_CAPSULE | Freq: Every day | ORAL | 0 refills | Status: DC

## 2019-10-11 NOTE — Patient Instructions (Signed)
PT will call you Xray downstairs Prednisone for 5 days Zanaflex at night Gabapentin 200mg  at night See me again in 6 weeks

## 2019-10-11 NOTE — Progress Notes (Signed)
[name] [address] [place], [place] [address] Phone: [phone] Subjective:     CC: Neck pain follow-up  [id number]   09/17/2019 Spasm noted on the left side.  Discussed with patient about icing regimen, home exercise, discussed proper lifting mechanics, which to avoid.  Patient is to increase activity slowly.  Patient will follow-up again 4 to 8 weeks  Update 10/11/2019 [name] is a 58 y.o. male coming in with complaint of left sided cervical spine pain. Pain has intensified since last visit. Did slip in his backyard and feels that he may have suffered from a whiplash like injury as he fell back and hit his head. Having hard time sleeping and hurts when he coughs.    Was having knee pain over the lateral aspect of patella. Pain is improving. Patient has not been working out.   Past Medical History:  Diagnosis Date  . Cancer ([facility])   . Chronic rhinitis   . Coronary atherosclerosis of unspecified type of vessel, native or graft   . Dysmetabolic syndrome X   . Erectile dysfunction   . Hypertrophy of prostate with urinary obstruction and other lower urinary tract symptoms (LUTS)   . Obstructive sleep apnea (adult) (pediatric)    no CPAP, has lost weight  . Problems related to high-risk sexual behavior   . Pure hypercholesterolemia   . Sickle cell anemia ([facility])   . Tobacco use disorder    Past Surgical History:  Procedure Laterality Date  . COSMETIC SURGERY    . HERNIA REPAIR     UHR  AT [facility]  <5 YRS AGO   . INCISION AND DRAINAGE ABSCESS / HEMATOMA OF BURSA / KNEE / THIGH     I&D of complex abscess,left axilla. I&D of large infected pilonidal abscess  . LAPAROSCOPIC PARTIAL COLECTOMY N/A 03/10/2015   Procedure: LAPAROSCOPIC ASSISTED RIGHT COLECTOMY;  Surgeon: Rolm Bookbinder, MD;  Location: [facility];  Service: General;  Laterality: N/A;  . MASS EXCISION N/A 10/19/2015   Procedure: EXCISION BACK MASS;  Surgeon: Rolm Bookbinder, MD;   Location: [facility];  Service: General;  Laterality: N/A;  . PTCA  2010   Social History   Socioeconomic History  . Marital status: Divorced    Spouse name: Not on file  . Number of children: 1  . Years of education: Not on file  . Highest education level: Not on file  Occupational History  . Not on file  Social Needs  . Financial resource strain: Not on file  . Food insecurity    Worry: Not on file    Inability: Not on file  . Transportation needs    Medical: Not on file    Non-medical: Not on file  Tobacco Use  . Smoking status: Current Every Day Smoker    Packs/day: 0.50    Years: 25.00    Pack years: 12.50    Types: Cigarettes  . Smokeless tobacco: Never Used  Substance and Sexual Activity  . Alcohol use: Yes    Alcohol/week: 0.0 standard drinks    Comment: weekends-2-3 drinks  . Drug use: No  . Sexual activity: Yes    Birth control/protection: Condom  Lifestyle  . Physical activity    Days per week: Not on file    Minutes per session: Not on file  . Stress: Not on file  Relationships  . Social Herbalist on phone: Not on file    Gets  together: Not on file    Attends religious service: Not on file    Active member of club or organization: Not on file    Attends meetings of clubs or organizations: Not on file    Relationship status: Not on file  Other Topics Concern  . Not on file  Social History Narrative  . Not on file   Allergies  Allergen Reactions  . Penicillins Hives and Itching   Family History  Problem Relation Age of Onset  . Breast cancer Mother   . Colon cancer Maternal Grandmother   . Stroke Maternal Grandmother        Over 50  . Colon cancer Maternal Grandfather   . Prostate cancer Maternal Grandfather   . Colon cancer Other        1st degree relative<60  . Heart attack Father        Over 34  . Esophageal cancer Neg Hx   . Stomach cancer Neg Hx   . Rectal cancer Neg Hx     Current Outpatient  Medications (Endocrine & Metabolic):  .  predniSONE (DELTASONE) 50 MG tablet, 1 tablet by mouth daily  Current Outpatient Medications (Cardiovascular):  .  rosuvastatin (CRESTOR) 20 MG tablet, Take 1 tablet (20 mg total) by mouth daily.   Current Outpatient Medications (Analgesics):  .  aspirin EC 81 MG tablet, Take 1 tablet (81 mg total) by mouth daily.   Current Outpatient Medications (Other):  Marland Kitchen  Diclofenac Sodium 2 % SOLN, Place 2 g onto the skin 2 (two) times daily. .  finasteride (PROSCAR) 5 MG tablet, TAKE 1 TABLET BY MOUTH EVERY DAY .  linaclotide (LINZESS) 145 MCG CAPS capsule, Take 1 capsule (145 mcg total) by mouth daily before breakfast. .  silodosin (RAPAFLO) 8 MG CAPS capsule, Take 1 capsule (8 mg total) by mouth daily with breakfast. .  tiZANidine (ZANAFLEX) 4 MG capsule, 1 tablet at night    Past medical history, social, surgical and family history all reviewed in electronic medical record.  No pertanent information unless stated regarding to the chief complaint.   Review of Systems:  No headache, visual changes, nausea, vomiting, diarrhea, constipation, dizziness, abdominal pain, skin rash, fevers, chills, night sweats, weight loss, swollen lymph nodes, body aches, joint swelling, muscle aches, chest pain, shortness of breath, mood changes.   Objective  There were no vitals taken for this visit. Systems examined below as of    General: No apparent distress alert and oriented x3 mood and affect normal, dressed appropriately.  HEENT: Pupils equal, extraocular movements intact  Respiratory: Patient's speak in full sentences and does not appear short of breath  Cardiovascular: No lower extremity edema, non tender, no erythema  Skin: Warm dry intact with no signs of infection or rash on extremities or on axial skeleton.  Abdomen: Soft nontender  Neuro: Cranial nerves II through XII are intact, neurovascularly intact in all extremities with 2+ DTRs and 2+ pulses.   Lymph: No lymphadenopathy of posterior or anterior cervical chain or axillae bilaterally.  Gait normal with good balance and coordination.  MSK:  Non tender with full range of motion and good stability and symmetric strength and tone of shoulders, elbows, wrist, hip, and ankles bilaterally.  Neck: Inspection mild loss of lordosis. No palpable stepoffs. Negative Spurling's maneuver. Worsening pain no with left-sided sidebending and rotation as well as extension of the neck.  Patient does have voluntary and involuntary guarding with extension greater than 15 degrees.  Neurovascular intact  distally of the upper extremities Grip strength and sensation normal in bilateral hands Strength good C4 to T1 distribution No sensory change to C4 to T1 Negative Hoffman sign bilaterally Reflexes normal  Knee: Bilateral Normal to inspection with no erythema or effusion or obvious bony abnormalities. Mild tenderness to palpation ROM full in flexion and extension and lower leg rotation. Ligaments with solid consistent endpoints including ACL, PCL, LCL, MCL. Negative Mcmurray's, Apley's, and Thessalonian tests. Non painful patellar compression. Patellar glide without crepitus. Patellar and quadriceps tendons unremarkable. Hamstring and quadriceps strength is normal.   Impression and Recommendations:     This case required medical decision making of moderate complexity. The above documentation has been reviewed and is accurate and complete Lyndal Pulley, DO       Note: This dictation was prepared with Dragon dictation along with smaller phrase technology. Any transcriptional errors that result from this process are unintentional.

## 2019-10-11 NOTE — Assessment & Plan Note (Signed)
Patient has had a history of a nerve impingement on the left side.  We discussed that he could need another MRI which she has had in 2015.  X-rays ordered today, laboratory work-up ordered today to secondary to patient also having some visual disturbances.  Patient denies any weakness in any of the extremities, no significant weakness noted and only localized pain to the neck that is worse with extension.  We discussed gabapentin, prednisone, as well as Zanaflex for any breakthrough pain.  Follow-up with me again in 4 to 8 weeks

## 2019-10-14 LAB — CALCIUM, IONIZED: Calcium, Ion: 4.97 mg/dL (ref 4.8–5.6)

## 2019-10-14 LAB — ANA: Anti Nuclear Antibody (ANA): NEGATIVE

## 2019-10-14 LAB — RHEUMATOID FACTOR: Rheumatoid fact SerPl-aCnc: <14 IU/mL (ref <14)

## 2019-10-14 LAB — PTH, INTACT AND CALCIUM
Calcium: 9.3 mg/dL (ref 8.6–10.3)
PTH: 8 pg/mL — ABNORMAL LOW (ref 14–64)

## 2019-10-14 LAB — ANGIOTENSIN CONVERTING ENZYME: Angiotensin-Converting Enzyme: 26 U/L (ref 9–67)

## 2019-10-14 LAB — CYCLIC CITRUL PEPTIDE ANTIBODY, IGG: Cyclic Citrullin Peptide Ab: <16 UNITS

## 2019-10-29 ENCOUNTER — Ambulatory Visit: Payer: 59 | Admitting: Physical Therapy

## 2019-11-16 ENCOUNTER — Other Ambulatory Visit: Payer: Self-pay | Admitting: Family Medicine

## 2019-11-19 ENCOUNTER — Telehealth: Payer: Self-pay

## 2019-11-19 NOTE — Telephone Encounter (Signed)
Key: [id number]

## 2019-11-19 NOTE — Progress Notes (Deleted)
[name] [place], [place] [address] Phone: [phone] Subjective:    I'm seeing this patient by the request  of:  Janith Lima, MD   CC: Neck pain follow-up  [id number]  [name] is a 58 y.o. male coming in with complaint of ***  Onset-  Location Duration-  Character- Aggravating factors- Reliving factors-  Therapies tried-  Severity-     Past Medical History:  Diagnosis Date  . Cancer ([place])   . Chronic rhinitis   . Coronary atherosclerosis of unspecified type of vessel, native or graft   . Dysmetabolic syndrome X   . Erectile dysfunction   . Hypertrophy of prostate with urinary obstruction and other lower urinary tract symptoms (LUTS)   . Obstructive sleep apnea (adult) (pediatric)    no CPAP, has lost weight  . Problems related to high-risk sexual behavior   . Pure hypercholesterolemia   . Sickle cell anemia (HCC)   . Tobacco use disorder    Past Surgical History:  Procedure Laterality Date  . COSMETIC SURGERY    . HERNIA REPAIR     UHR  AT [facility]  <5 YRS AGO   . INCISION AND DRAINAGE ABSCESS / HEMATOMA OF BURSA / KNEE / THIGH     I&D of complex abscess,left axilla. I&D of large infected pilonidal abscess  . LAPAROSCOPIC PARTIAL COLECTOMY N/A 03/10/2015   Procedure: LAPAROSCOPIC ASSISTED RIGHT COLECTOMY;  Surgeon: Rolm Bookbinder, MD;  Location: [facility];  Service: General;  Laterality: N/A;  . MASS EXCISION N/A 10/19/2015   Procedure: EXCISION BACK MASS;  Surgeon: Rolm Bookbinder, MD;  Location: [facility];  Service: General;  Laterality: N/A;  . PTCA  2010   Social History   Socioeconomic History  . Marital status: Divorced    Spouse name: Not on file  . Number of children: 1  . Years of education: Not on file  . Highest education level: Not on file  Occupational History  . Not on file  Tobacco Use  . Smoking status: Current Every Day Smoker    Packs/day: 0.50    Years:  25.00    Pack years: 12.50    Types: Cigarettes  . Smokeless tobacco: Never Used  Substance and Sexual Activity  . Alcohol use: Yes    Alcohol/week: 0.0 standard drinks    Comment: weekends-2-3 drinks  . Drug use: No  . Sexual activity: Yes    Birth control/protection: Condom  Other Topics Concern  . Not on file  Social History Narrative  . Not on file   Social Determinants of Health   Financial Resource Strain:   . Difficulty of Paying Living Expenses: Not on file  Food Insecurity:   . Worried About Charity fundraiser in the Last Year: Not on file  . Ran Out of Food in the Last Year: Not on file  Transportation Needs:   . Lack of Transportation (Medical): Not on file  . Lack of Transportation (Non-Medical): Not on file  Physical Activity:   . Days of Exercise per Week: Not on file  . Minutes of Exercise per Session: Not on file  Stress:   . Feeling of Stress : Not on file  Social Connections:   . Frequency of Communication with Friends and Family: Not on file  . Frequency of Social Gatherings with Friends and Family: Not on file  . Attends Religious Services: Not on file  . Active Member of  Clubs or Organizations: Not on file  . Attends Archivist Meetings: Not on file  . Marital Status: Not on file   Allergies  Allergen Reactions  . Penicillins Hives and Itching   Family History  Problem Relation Age of Onset  . Breast cancer Mother   . Colon cancer Maternal Grandmother   . Stroke Maternal Grandmother        Over 53  . Colon cancer Maternal Grandfather   . Prostate cancer Maternal Grandfather   . Colon cancer Other        1st degree relative<60  . Heart attack Father        Over 44  . Esophageal cancer Neg Hx   . Stomach cancer Neg Hx   . Rectal cancer Neg Hx     Current Outpatient Medications (Endocrine & Metabolic):  .  predniSONE (DELTASONE) 50 MG tablet, 1 tablet by mouth daily .  predniSONE (DELTASONE) 50 MG tablet, Take one tablet daily  for the next 5 days.  Current Outpatient Medications (Cardiovascular):  .  rosuvastatin (CRESTOR) 20 MG tablet, Take 1 tablet (20 mg total) by mouth daily.   Current Outpatient Medications (Analgesics):  .  aspirin EC 81 MG tablet, Take 1 tablet (81 mg total) by mouth daily.   Current Outpatient Medications (Other):  Marland Kitchen  Diclofenac Sodium 2 % SOLN, Place 2 g onto the skin 2 (two) times daily. .  finasteride (PROSCAR) 5 MG tablet, TAKE 1 TABLET BY MOUTH EVERY DAY .  gabapentin (NEURONTIN) 100 MG capsule, Take 2 capsules (200 mg total) by mouth at bedtime. Marland Kitchen  linaclotide (LINZESS) 145 MCG CAPS capsule, Take 1 capsule (145 mcg total) by mouth daily before breakfast. .  silodosin (RAPAFLO) 8 MG CAPS capsule, Take 1 capsule (8 mg total) by mouth daily with breakfast. .  tiZANidine (ZANAFLEX) 4 MG capsule, 1 tablet at night .  tiZANidine (ZANAFLEX) 4 MG tablet, Take 1 tablet (4 mg total) by mouth at bedtime.    Past medical history, social, surgical and family history all reviewed in electronic medical record.  No pertanent information unless stated regarding to the chief complaint.   Review of Systems:  No headache, visual changes, nausea, vomiting, diarrhea, constipation, dizziness, abdominal pain, skin rash, fevers, chills, night sweats, weight loss, swollen lymph nodes, body aches, joint swelling, muscle aches, chest pain, shortness of breath, mood changes.   Objective  There were no vitals taken for this visit. Systems examined below as of    General: No apparent distress alert and oriented x3 mood and affect normal, dressed appropriately.  HEENT: Pupils equal, extraocular movements intact  Respiratory: Patient's speak in full sentences and does not appear short of breath  Cardiovascular: No lower extremity edema, non tender, no erythema  Skin: Warm dry intact with no signs of infection or rash on extremities or on axial skeleton.  Abdomen: Soft nontender  Neuro: Cranial nerves II  through XII are intact, neurovascularly intact in all extremities with 2+ DTRs and 2+ pulses.  Lymph: No lymphadenopathy of posterior or anterior cervical chain or axillae bilaterally.  Gait normal with good balance and coordination.  MSK:  Non tender with full range of motion and good stability and symmetric strength and tone of shoulders, elbows, wrist, hip, knee and ankles bilaterally.     Impression and Recommendations:     This case required medical decision making of moderate complexity. The above documentation has been reviewed and is accurate and complete Scott Pulley,  DO       Note: This dictation was prepared with Dragon dictation along with smaller phrase technology. Any transcriptional errors that result from this process are unintentional.

## 2019-11-20 ENCOUNTER — Ambulatory Visit: Payer: 59 | Admitting: Family Medicine

## 2019-12-24 ENCOUNTER — Other Ambulatory Visit: Payer: Self-pay

## 2019-12-24 ENCOUNTER — Ambulatory Visit (INDEPENDENT_AMBULATORY_CARE_PROVIDER_SITE_OTHER): Payer: 59 | Admitting: Internal Medicine

## 2019-12-24 ENCOUNTER — Ambulatory Visit (INDEPENDENT_AMBULATORY_CARE_PROVIDER_SITE_OTHER): Payer: 59 | Admitting: Family Medicine

## 2019-12-24 ENCOUNTER — Encounter: Payer: Self-pay | Admitting: Internal Medicine

## 2019-12-24 ENCOUNTER — Ambulatory Visit: Payer: Self-pay

## 2019-12-24 ENCOUNTER — Ambulatory Visit (INDEPENDENT_AMBULATORY_CARE_PROVIDER_SITE_OTHER): Payer: 59

## 2019-12-24 ENCOUNTER — Encounter: Payer: Self-pay | Admitting: Family Medicine

## 2019-12-24 VITALS — BP 138/88 | HR 79 | Ht 72.0 in | Wt 230.0 lb

## 2019-12-24 VITALS — BP 138/88 | HR 79 | Temp 98.2°F | Resp 16 | Ht 72.0 in | Wt 230.0 lb

## 2019-12-24 DIAGNOSIS — M25432 Effusion, left wrist: Secondary | ICD-10-CM

## 2019-12-24 DIAGNOSIS — M25532 Pain in left wrist: Secondary | ICD-10-CM

## 2019-12-24 DIAGNOSIS — M25561 Pain in right knee: Secondary | ICD-10-CM

## 2019-12-24 DIAGNOSIS — G8929 Other chronic pain: Secondary | ICD-10-CM | POA: Diagnosis not present

## 2019-12-24 DIAGNOSIS — M659 Synovitis and tenosynovitis, unspecified: Secondary | ICD-10-CM | POA: Diagnosis not present

## 2019-12-24 LAB — CBC WITH DIFFERENTIAL/PLATELET
Basophils Absolute: 0.1 10*3/uL (ref 0.0–0.1)
Basophils Relative: 1.3 % (ref 0.0–3.0)
Eosinophils Absolute: 0.2 10*3/uL (ref 0.0–0.7)
Eosinophils Relative: 6.1 % — ABNORMAL HIGH (ref 0.0–5.0)
HCT: 43.3 % (ref 39.0–52.0)
Hemoglobin: 14.2 g/dL (ref 13.0–17.0)
Lymphocytes Relative: 29.2 % (ref 12.0–46.0)
Lymphs Abs: 1.2 10*3/uL (ref 0.7–4.0)
MCHC: 32.9 g/dL (ref 30.0–36.0)
MCV: 85.1 fl (ref 78.0–100.0)
Monocytes Absolute: 0.5 10*3/uL (ref 0.1–1.0)
Monocytes Relative: 13.1 % — ABNORMAL HIGH (ref 3.0–12.0)
Neutro Abs: 2.1 10*3/uL (ref 1.4–7.7)
Neutrophils Relative %: 50.3 % (ref 43.0–77.0)
Platelets: 251 10*3/uL (ref 150.0–400.0)
RBC: 5.08 Mil/uL (ref 4.22–5.81)
RDW: 13.8 % (ref 11.5–15.5)
WBC: 4.1 10*3/uL (ref 4.0–10.5)

## 2019-12-24 LAB — SEDIMENTATION RATE: Sed Rate: 11 mm/hr (ref 0–20)

## 2019-12-24 LAB — URIC ACID: Uric Acid, Serum: 4.3 mg/dL (ref 4.0–7.8)

## 2019-12-24 NOTE — Progress Notes (Signed)
Subjective:  Patient ID: [name], male    DOB: 1960/12/24  Age: 59 y.o. MRN: [id number]  CC: Wrist Pain  This visit occurred during the SARS-CoV-2 public health emergency.  Safety protocols were in place, including screening questions prior to the visit, additional usage of staff PPE, and extensive cleaning of exam room while observing appropriate contact time as indicated for disinfecting solutions.    HPI [name] presents for f/up - He complains of a 1 month history of nontraumatic pain and swelling on the dorsum of his left wrist.  The swelling extends to the dorsum of the hand.  He is controlling the pain with ibuprofen and Tylenol.  He has chronic, bilateral knee pain but none of his other joints bother him.  Outpatient Medications Prior to Visit  Medication Sig Dispense Refill  . aspirin EC 81 MG tablet Take 1 tablet (81 mg total) by mouth daily. 90 tablet 1  . [facility] SALINE NASAL SPRAY 0.65 % nasal spray USE 3 4 TIMES PER DAY STARTING DAY AFTER SURGERY AND CONTINUE FOR ONE MONTH    . fluticasone (FLONASE) 50 MCG/ACT nasal spray Place 1 spray into both nostrils daily.    Marland Kitchen linaclotide (LINZESS) 145 MCG CAPS capsule Take 1 capsule (145 mcg total) by mouth daily before breakfast. 84 capsule 0  . Multiple Vitamin (MULTI-VITAMIN) tablet Take by mouth.    . neomycin-polymyxin b-dexamethasone (MAXITROL) 3.5-10000-0.1 SUSP INSTILL 1 DROP INTO OPERATIVE EYE THREE TIMES PER DAY FOR 10 DAYS THEN STOP    . Diclofenac Sodium 2 % SOLN Place 2 g onto the skin 2 (two) times daily. 112 g 3  . finasteride (PROSCAR) 5 MG tablet TAKE 1 TABLET BY MOUTH EVERY DAY 90 tablet 1  . gabapentin (NEURONTIN) 100 MG capsule Take 2 capsules (200 mg total) by mouth at bedtime. 180 capsule 0  . predniSONE (DELTASONE) 50 MG tablet 1 tablet by mouth daily 5 tablet 0  . predniSONE (DELTASONE) 50 MG tablet Take one tablet daily for the next 5 days. 5 tablet 0  . rosuvastatin (CRESTOR) 20 MG tablet Take 1 tablet  (20 mg total) by mouth daily. 90 tablet 1  . silodosin (RAPAFLO) 8 MG CAPS capsule Take 1 capsule (8 mg total) by mouth daily with breakfast. 90 capsule 1  . tiZANidine (ZANAFLEX) 4 MG capsule 1 tablet at night 30 capsule 0  . tiZANidine (ZANAFLEX) 4 MG tablet TAKE 1 TABLET (4 MG TOTAL) BY MOUTH AT BEDTIME. 30 tablet 0   No facility-administered medications prior to visit.    ROS Review of Systems  Constitutional: Negative for chills, diaphoresis and fever.  HENT: Negative.   Eyes: Negative for visual disturbance.  Respiratory: Negative for cough, chest tightness, shortness of breath and wheezing.   Cardiovascular: Negative for chest pain, palpitations and leg swelling.  Gastrointestinal: Negative for abdominal pain, diarrhea, nausea and vomiting.  Endocrine: Negative.   Musculoskeletal: Positive for arthralgias. Negative for myalgias.  Skin: Negative for color change and pallor.  Neurological: Negative for dizziness, weakness and light-headedness.  Hematological: Negative for adenopathy. Does not bruise/bleed easily.  Psychiatric/Behavioral: Negative.     Objective:  BP 138/88 (BP Location: Left Arm, Patient Position: Sitting, Cuff Size: Large)   Pulse 79   Temp 98.2 F (36.8 C) (Oral)   Resp 16   Ht 6' (1.829 m)   Wt 230 lb (104.3 kg)   SpO2 97%   BMI 31.19 kg/m   BP Readings from Last 3  Encounters:  12/24/19 138/88  10/11/19 122/86  09/17/19 120/70    Wt Readings from Last 3 Encounters:  12/24/19 230 lb (104.3 kg)  10/11/19 231 lb (104.8 kg)  09/17/19 231 lb (104.8 kg)    Physical Exam HENT:     Nose: Nose normal.  Eyes:     General: No scleral icterus.    Conjunctiva/sclera: Conjunctivae normal.  Cardiovascular:     Rate and Rhythm: Normal rate and regular rhythm.     Heart sounds: No murmur.  Pulmonary:     Effort: Pulmonary effort is normal.     Breath sounds: No stridor. No wheezing, rhonchi or rales.  Abdominal:     General: Abdomen is protuberant.      Palpations: There is no hepatomegaly.  Musculoskeletal:     Right wrist: Normal.     Left wrist: Swelling, effusion, tenderness and bony tenderness present. No deformity or crepitus. Decreased range of motion.     Cervical back: Neck supple.     Comments: There is swelling in the left wrist joint and a subtle area of subcutaneous swelling on the dorsum of the left wrist that extends to the dorsum of the left hand.  Lymphadenopathy:     Cervical: No cervical adenopathy.  Neurological:     Mental Status: He is alert.     Lab Results  Component Value Date   WBC 4.7 10/11/2019   HGB 14.3 10/11/2019   HCT 42.5 10/11/2019   PLT 248.0 10/11/2019   GLUCOSE 114 (H) 07/18/2019   CHOL 145 07/18/2019   TRIG 114.0 07/18/2019   HDL 50.70 07/18/2019   LDLDIRECT 132.4 07/29/2008   LDLCALC 72 07/18/2019   ALT 21 07/18/2019   AST 20 07/18/2019   NA 141 07/18/2019   K 3.7 07/18/2019   CL 106 07/18/2019   CREATININE 1.16 07/18/2019   BUN 12 07/18/2019   CO2 24 07/18/2019   TSH 0.79 10/11/2019   PSA 4.4 05/29/2018   HGBA1C 5.8 07/18/2019   MICROALBUR 1.2 03/22/2012    DG Cervical Spine 2 or 3 views  Result Date: 10/11/2019 CLINICAL DATA:  Cervical spine pain. EXAM: CERVICAL SPINE - 2-3 VIEW COMPARISON:  MRI 12/30/2013. FINDINGS: Diffuse multilevel degenerative change. No acute bony abnormality identified. No evidence of fracture or dislocation. Pulmonary apices are clear. IMPRESSION: Diffuse multilevel degenerative change. No acute abnormality identified. Electronically Signed   By: Marcello Moores  Register   On: 10/11/2019 09:06    DG Wrist Complete Left  Result Date: 12/24/2019 CLINICAL DATA:  Pain and swelling for 3-4 weeks EXAM: LEFT WRIST - COMPLETE 3+ VIEW COMPARISON:  None. FINDINGS: There is no evidence of fracture or dislocation. There is no evidence of arthropathy or other focal bone abnormality. Small radiopaque linear metallic foreign body in the volar soft tissues of the distal  forearm. Soft tissues are otherwise normal. IMPRESSION: No acute osseous injury of the left wrist. Electronically Signed   By: Kathreen Devoid   On: 12/24/2019 13:50    Assessment & Plan:   [name] was seen today for wrist pain.  Diagnoses and all orders for this visit:   Pain and swelling of left wrist- His plain film is unremarkable.  I have asked him to undergo lab work to see if there is an inflammatory process contributing to this.  He saw sports medicine today and underwent a steroid injection for treatment of tenosynovitis.  He will let me know if he needs anything other than this to control the  pain and swelling. -     DG Wrist Complete Left; Future -     CBC with Differential/Platelet -     Rheumatoid factor -     ANA -     Uric acid -     Cyclic citrul peptide antibody, IgG -     Sedimentation rate   I have discontinued Alvester Chou T. Haggar's silodosin, finasteride, rosuvastatin, Diclofenac Sodium, predniSONE, gabapentin, predniSONE, and tiZANidine. I am also having him maintain his aspirin EC, linaclotide, fluticasone, CVS Saline Nasal Spray, Multi-Vitamin, and neomycin-polymyxin b-dexamethasone.  No orders of the defined types were placed in this encounter.    Follow-up: Return in about 4 weeks (around 01/21/2020).  Scarlette Calico, MD

## 2019-12-24 NOTE — Patient Instructions (Signed)
Wrist Pain, Adult °There are many things that can cause wrist pain. Some common causes include: °· An injury to the wrist area, such as a sprain, strain, or fracture. °· Overuse of the joint. °· A condition that causes increased pressure on a nerve in the wrist (carpal tunnel syndrome). °· Wear and tear of the joints that occurs with aging (osteoarthritis). °· A variety of other types of arthritis. °Sometimes, the cause of wrist pain is not known. Often, the pain goes away when you follow instructions from your health care provider for relieving pain at home, such as resting or icing the wrist. If your wrist pain continues, it is important to tell your health care provider. °Follow these instructions at home: °· Rest the wrist area for at least 48 hours or as long as told by your health care provider. °· If a splint or elastic bandage has been applied, use it as told by your health care provider. °? Remove the splint or bandage only as told by your health care provider. °? Loosen the splint or bandage if your fingers tingle, become numb, or turn cold or blue. °· If directed, apply ice to the injured area. °? If you have a removable splint or elastic bandage, remove it as told by your health care provider. °? Put ice in a plastic bag. °? Place a towel between your skin and the bag or between your splint or bandage and the bag. °? Leave the ice on for 20 minutes, 2-3 times a day. ° °· Keep your arm raised (elevated) above the level of your heart while you are sitting or lying down. °· Take over-the-counter and prescription medicines only as told by your health care provider. °· Keep all follow-up visits as told by your health care provider. This is important. °Contact a health care provider if: °· You have a sudden sharp pain in the wrist, hand, or arm that is different or new. °· The swelling or bruising on your wrist or hand gets worse. °· Your skin becomes red, gets a rash, or has open sores. °· Your pain does not  get better or it gets worse. °Get help right away if: °· You lose feeling in your fingers or hand. °· Your fingers turn white, very red, or cold and blue. °· You cannot move your fingers. °· You have a fever or chills. °This information is not intended to replace advice given to you by your health care provider. Make sure you discuss any questions you have with your health care provider. °Document Revised: 10/27/2017 Document Reviewed: 06/02/2016 °Elsevier Patient Education © 2020 [employer] ° °

## 2019-12-24 NOTE — Patient Instructions (Addendum)
You had a L wrist injection today. Call or go to the ER if you develop a large red swollen joint with extreme pain or oozing puss.   We will use a wrist brace as needed.  Use the voltaren gel on the wrist.  Get xray and labs.  Recheck in 1 month.  We may change the plan based on labs and xray.    Tenosynovitis  Tenosynovitis is inflammation of a tendon and of the sleeve of tissue that covers the tendon (tendon sheath). A tendon is a cord of tissue that connects muscle to bone. Normally, a tendon slides smoothly inside its tendon sheath. Tenosynovitis limits movement of the tendon and surrounding tissues, which may cause pain and stiffness. Tenosynovitis can affect any tendon and tendon sheath. Commonly affected areas include tendons in the:  Wrist.  Arm.  Hand.  Hip.  Leg.  Foot.  Shoulder. What are the causes? The main cause of this condition is wear and tear over time that results in slight tears in the tendon. Other possible causes include:  An injury to the tendon or tendon sheath.  A disease that causes inflammation in the body.  An infection that spreads to the tendon and tendon sheath from a skin wound.  An infection in another part of the body that spreads to the tendon and tendon sheath through the blood. What increases the risk? The following factors may make you more likely to develop this condition:  Having rheumatoid arthritis, gout, or diabetes.  Using IV drugs.  Doing physical activities that can cause tendon overuse and stress.  Having gonorrhea. What are the signs or symptoms? Symptoms of this condition depend on the cause. Symptoms may include:  Pain with movement.  Pain when pressing on the tendon and tendon sheath.  Swelling.  Stiffness. If tenosynovitis is caused by an infection, symptoms may include:  Fever.  Redness.  Warmth. How is this diagnosed? This condition may be diagnosed based on your medical history and a physical exam.  You also may have:  Blood tests.  Imaging tests, such as: ? MRI. ? Ultrasound.  A sample of fluid removed from inside the tendon sheath to be checked in a lab. How is this treated? Treatment for this condition depends on the cause. If tenosynovitis is not caused by an infection, treatment may include:  Rest.  Keeping the tendon in place (immobilization) in a splint, brace, or sling.  Taking NSAIDs to reduce pain and swelling.  A shot (injection) of medicine to help reduce pain and swelling (steroid).  Icing or applying heat to the affected area.  Physical therapy.  Surgery to release the tendon in the sheath or to repair damage to the tendon or tendon sheath. Surgery may be done if other treatments do not help relieve symptoms. If tenosynovitis is caused by infection, treatment may include antibiotic medicine given through an IV. In some cases, surgery may be needed to drain fluid from the tendon sheath or to remove the tendon sheath. Follow these instructions at home: If you have a splint, brace, or sling:   Wear the splint, brace, or sling as told by your health care provider. Remove it only as told by your health care provider.  Loosen the splint, brace, or sling if your fingers or toes tingle, become numb, or turn cold and blue.  Keep the splint, brace, or sling clean.  If the splint, brace, or sling is not waterproof: ? Do not let it get wet. ?  Cover it with a watertight covering when you take a bath or shower. Managing pain, stiffness, and swelling   If directed, put ice on the affected area. ? Put ice in a plastic bag. ? Place a towel between your skin and the bag. ? Leave the ice on for 20 minutes, 2-3 times a day.  Move the fingers or toes of the affected limb often, if this applies. This can help to reduce stiffness and swelling.  If directed, raise (elevate) the affected area above the level of your heart while you are sitting or lying down.  If directed,  apply heat to the affected area before you exercise. Use the heat source that your health care provider recommends, such as a moist heat pack or a heating pad. ? Place a towel between your skin and the heat source. ? Leave the heat on for 20-30 minutes. ? Remove the heat if your skin turns bright red. This is especially important if you are unable to feel pain, heat, or cold. You may have a greater risk of getting burned. Medicines  Take over-the-counter and prescription medicines only as told by your health care provider.  Ask your health care provider if the medicine prescribed to you: ? Requires you to avoid driving or using heavy machinery. ? Can cause constipation. You may need to take actions to prevent or treat constipation, such as:  Drink enough fluid to keep your urine pale yellow.  Take over-the-counter or prescription medicines.  Eat foods that are high in fiber, such as beans, whole grains, and fresh fruits and vegetables.  Limit foods that are high in fat and processed sugars, such as fried or sweet foods. Activity  Return to your normal activities as told by your health care provider. Ask your health care provider what activities are safe for you.  Rest the affected area as told by your health care provider.  Avoid using the affected area while you are having symptoms.  Do not use the injured limb to support your body weight until your health care provider says that you can.  If physical therapy was prescribed, do exercises as told by your health care provider. General instructions  Ask your health care provider when it is safe to drive if you have a splint or brace on any part of your arm or leg.  Keep all follow-up visits as told by your health care provider. This is important. Contact a health care provider if:  Your symptoms are not improving or are getting worse. Get help right away if:  Your fingers or toes become numb or turn blue.  You have a fever and  more of any of the following symptoms: ? Pain. ? Redness. ? Warmth. ? Swelling. Summary  Tenosynovitis is inflammation of a tendon and of the sleeve of tissue that covers the tendon (tendon sheath).  Treatment for this condition depends on the cause. Treatment may include rest, medicines, physical therapy, or surgery.  Contact a health care provider if your symptoms are not improving or are getting worse.  Keep all follow-up visits as told by your health care provider. This is important. This information is not intended to replace advice given to you by your health care provider. Make sure you discuss any questions you have with your health care provider. Document Revised: 07/05/2018 Document Reviewed: 07/05/2018 Elsevier Patient Education  [place].

## 2019-12-24 NOTE — Progress Notes (Signed)
I, Wendy Poet, LAT, ATC, am serving as scribe for Dr. [name].  [name] is a 59 y.o. male who presents to [place] at [facility] today for f/u of chronic R  knee pain and L wrist pain.  Pt was last seen by Dr. Tamala Julian for B knee pain on 08/27/19 and had B knee injections.  Since his last visit w/ Dr. Tamala Julian, pt reports little to no change.  He rates his current R knee pain at a 6-7/10 and describes his pain as sharp, aching and throbbing.  Radiating pain: intermittent Knee swelling: Yes Aggravating factors: most pain when he tries to lay down to go to sleep; pain w/ ambulation too Treatments tried:  R knee injection; Voltaren gel; Advil  Diagnostic testing: B knee XR - 08/12/19  L wrist:  Pt is a LHD male presenting w/ L wrist pain x 4 weeks w/ no known MOI.  He notes that his L wrist has been getting progressively worse.  He notes that his L wrist is aggravated w/ L wrist AROM.  He has a cyst and swelling in his L wrist and proximal hand.  He denies any numbness/tingling in the L hand or wrist.  He denies any L wrist mechanical symptoms.   Pertinent review of systems: No fevers or chills  Relevant historical information: Tobacco use   Exam:  BP 138/88 (BP Location: Left Arm, Patient Position: Sitting, Cuff Size: Large)   Pulse 79   Ht 6' (1.829 m)   Wt 230 lb (104.3 kg)   SpO2 97%   BMI 31.19 kg/m  General: Well Developed, well nourished, and in no acute distress.   MSK:  Left wrist: Swollen dorsal wrist overlying fourth dorsal wrist compartment and dorsal hand. Tender palpation dorsal fourth wrist compartment. Decreased wrist motion. Intact strength over pain with resisted wrist and finger extension. Pulses cap refill and sensation intact distally.  Right knee: Mild effusion otherwise normal. Range of motion 0-120 degrees with mild crepitation. Stable ligamentous exam.  Intact strength.    Lab and Radiology Results No results found for this  or any previous visit (from the past 72 hour(s)). DG Wrist Complete Left  Result Date: 12/24/2019 CLINICAL DATA:  Pain and swelling for 3-4 weeks EXAM: LEFT WRIST - COMPLETE 3+ VIEW COMPARISON:  None. FINDINGS: There is no evidence of fracture or dislocation. There is no evidence of arthropathy or other focal bone abnormality. Small radiopaque linear metallic foreign body in the volar soft tissues of the distal forearm. Soft tissues are otherwise normal. IMPRESSION: No acute osseous injury of the left wrist. Electronically Signed   By: Kathreen Devoid   On: 12/24/2019 13:50   I, [name], personally (independently) visualized and performed the interpretation of the images attached in this note.  Limited musculoskeletal ultrasound left dorsal wrist reveals hypoechoic fluid collecting in 4 dorsal wrist compartment.  Tendon structures normal-appearing.  No bony abnormalities. Impression: Fourth dorsal wrist compartment tenosynovitis hand.  Procedure: Real-time Ultrasound Guided Injection of left wrist fourth dorsal wrist compartment Device: Philips Affiniti 50G Images permanently stored and available for review in the ultrasound unit. Verbal informed consent obtained.  Discussed risks and benefits of procedure. Warned about infection bleeding damage to structures skin hypopigmentation and fat atrophy among others. Patient expresses understanding and agreement Time-out conducted.   Noted no overlying erythema, induration, or other signs of local infection.   Skin prepped in a sterile fashion.   Local anesthesia: Topical Ethyl  chloride.   With sterile technique and under real time ultrasound guidance:  40 mg of Depo-Medrol and 2 mL of lidocaine injected easily.   Completed without difficulty   Pain immediately resolved suggesting accurate placement of the medication.   Advised to call if fevers/chills, erythema, induration, drainage, or persistent bleeding.   Images permanently stored and available  for review in the ultrasound unit.  Impression: Technically successful ultrasound guided injection.       Assessment and Plan: 59 y.o. male with  Left wrist pain and swelling.  Patient has clinical exam and per MSK ultrasound exam tenosynovitis. Plan to treat with injection as above along with wrist brace and Voltaren gel.  Recheck back in 1 month.  Return sooner if needed.  Chronic right knee pain: In the setting of mild DJD seen on x-ray September 2020 and a probable meniscus tear.  Patient is in the process of being worked up by PCP including metabolic work-up including uric acid and rheumatologic labs.  This will be helpful.  Additionally we will plan on obtaining dedicated x-rays of right knee.  These were ordered to be done at the end of the visit and they are still pending.  Will be scheduled in the near future. Plan to recheck in a month.  Proceed with either repeat steroid injection or possibly hyaluronic acid injection based on labs and x-ray.   PDMP not reviewed this encounter. Orders Placed This Encounter  Procedures  . [place] LIMITED JOINT SPACE STRUCTURES LOW RIGHT(NO LINKED CHARGES)    Order Specific Question:   Reason for Exam (SYMPTOM  OR DIAGNOSIS REQUIRED)    Answer:   R knee pain    Order Specific Question:   Preferred imaging location?    Answer:   [place]  . DG Knee 4 Views W/Patella Right    Standing Status:   Future    Standing Expiration Date:   02/20/2021    Order Specific Question:   Reason for Exam (SYMPTOM  OR DIAGNOSIS REQUIRED)    Answer:   eval right knee pain    Order Specific Question:   Preferred imaging location?    Answer:   [facility]    Order Specific Question:   Radiology Contrast Protocol - do NOT remove file path    Answer:   \\[email or web address]   No orders of the defined types were placed in this encounter.    Discussed warning signs or symptoms. Please see  discharge instructions. Patient expresses understanding.   The above documentation has been reviewed and is accurate and complete [name]

## 2019-12-25 NOTE — Progress Notes (Signed)
Xray knee shows mild arthritis.

## 2019-12-26 LAB — ANA: Anti Nuclear Antibody (ANA): NEGATIVE

## 2019-12-26 LAB — RHEUMATOID FACTOR: Rheumatoid fact SerPl-aCnc: <14 IU/mL (ref <14)

## 2019-12-26 LAB — CYCLIC CITRUL PEPTIDE ANTIBODY, IGG: Cyclic Citrullin Peptide Ab: <16 UNITS

## 2020-01-20 ENCOUNTER — Ambulatory Visit: Payer: Self-pay | Admitting: Family Medicine

## 2020-01-20 DIAGNOSIS — Z0289 Encounter for other administrative examinations: Secondary | ICD-10-CM

## 2020-01-20 NOTE — Progress Notes (Deleted)
   I, Wendy Poet, LAT, ATC, am serving as scribe for Dr. [name].  [name] is a 59 y.o. male who presents to [place] at [facility] today for L knee, R knee and L wrist pain.  He was last seen by Dr. Georgina Snell on 12/24/19 for R knee pain that he rated at a 6-7/10 and L wrist pain.  He had a R knee XR at his last visit and a L wrist injection.  Since his last visit, pt reports   Diagnostic imaging: R knee and L wrist XR- 12/24/19   Pertinent review of systems: ***  Relevant historical information: ***   Exam:  There were no vitals taken for this visit. General: Well Developed, well nourished, and in no acute distress.   MSK: ***    Lab and Radiology Results No results found for this or any previous visit (from the past 72 hour(s)). No results found.     Assessment and Plan: 59 y.o. male with ***   PDMP not reviewed this encounter. No orders of the defined types were placed in this encounter.  No orders of the defined types were placed in this encounter.    Discussed warning signs or symptoms. Please see discharge instructions. Patient expresses understanding.   ***

## 2020-03-05 ENCOUNTER — Ambulatory Visit: Payer: 59 | Admitting: Internal Medicine

## 2020-03-05 DIAGNOSIS — Z0289 Encounter for other administrative examinations: Secondary | ICD-10-CM

## 2020-03-31 ENCOUNTER — Other Ambulatory Visit: Payer: Self-pay

## 2020-03-31 ENCOUNTER — Ambulatory Visit (INDEPENDENT_AMBULATORY_CARE_PROVIDER_SITE_OTHER): Payer: 59 | Admitting: Family Medicine

## 2020-03-31 VITALS — BP 118/86 | HR 68 | Ht 72.0 in | Wt 239.0 lb

## 2020-03-31 DIAGNOSIS — G8929 Other chronic pain: Secondary | ICD-10-CM | POA: Insufficient documentation

## 2020-03-31 DIAGNOSIS — M25561 Pain in right knee: Secondary | ICD-10-CM | POA: Diagnosis not present

## 2020-03-31 DIAGNOSIS — M25562 Pain in left knee: Secondary | ICD-10-CM | POA: Diagnosis not present

## 2020-03-31 DIAGNOSIS — M170 Bilateral primary osteoarthritis of knee: Secondary | ICD-10-CM

## 2020-03-31 NOTE — Patient Instructions (Addendum)
Thank you for coming in today.  Look at the bottle of supplements.  Ok to send the label to me.  Glucosamine and Chrondrotin and possibly tumeric.  Compression knee sleeve during exercise and for 30-60 mins following exercise.  Ice if swollen and hurting.  We can authorize the gel shots.  Eventually the knees will wear out and require total joint replacement.   Body helix full knee sleeve is a good one.   Sodium Hyaluronate intra-articular injection What is this medicine? SODIUM HYALURONATE (SOE dee um hye al yoor ON ate) is used to treat pain in the knee due to osteoarthritis. This medicine may be used for other purposes; ask your health care provider or pharmacist if you have questions. COMMON BRAND NAME(S): Amvisc, DUROLANE, Euflexxa, GELSYN-3, Hyalgan, Hymovis, Monovisc, Orthovisc, Supartz, Supartz FX, TriVisc, VISCO What should I tell my health care provider before I take this medicine? They need to know if you have any of these conditions:  bleeding disorders  glaucoma  infection in the knee joint  skin conditions or sensitivity  skin infection  an unusual allergic reaction to sodium hyaluronate, other medicines, foods, dyes, or preservatives. Different brands of sodium hyaluronate contain different allergens. Some may contain egg. Talk to your doctor about your allergies to make sure that you get the right product.  pregnant or trying to get pregnant  breast-feeding How should I use this medicine? This medicine is for injection into the knee joint. It is given by a health care professional in a hospital or clinic setting. Talk to your pediatrician regarding the use of this medicine in children. Special care may be needed. Overdosage: If you think you have taken too much of this medicine contact a poison control center or emergency room at once. NOTE: This medicine is only for you. Do not share this medicine with others. What if I miss a dose? This does not apply. What  may interact with this medicine? Interactions are not expected. This list may not describe all possible interactions. Give your health care provider a list of all the medicines, herbs, non-prescription drugs, or dietary supplements you use. Also tell them if you smoke, drink alcohol, or use illegal drugs. Some items may interact with your medicine. What should I watch for while using this medicine? Tell your doctor or healthcare professional if your symptoms do not start to get better or if they get worse. If receiving this medicine for osteoarthritis, limit your activity after you receive your injection. Avoid physical activity for 48 hours following your injection to keep your knee from swelling. Do not stand on your feet for more than 1 hour at a time during the first 48 hours following your injection. Ask your doctor or healthcare professional about when you can begin major physical activity again. What side effects may I notice from receiving this medicine? Side effects that you should report to your doctor or health care professional as soon as possible:  allergic reactions like skin rash, itching or hives, swelling of the face, lips, or tongue  dizziness  facial flushing  pain, tingling, numbness in the hands or feet  vision changes if received this medicine during eye surgery Side effects that usually do not require medical attention (report to your doctor or health care professional if they continue or are bothersome):  back pain  bruising at site where injected  chills  diarrhea  fever  headache  joint pain  joint stiffness  joint swelling  muscle cramps  muscle pain  nausea, vomiting  pain, redness, or irritation at site where injected  weak or tired This list may not describe all possible side effects. Call your doctor for medical advice about side effects. You may report side effects to FDA at 1-800-FDA-1088. Where should I keep my medicine? This drug is  given in a hospital or clinic and will not be stored at home. NOTE: This sheet is a summary. It may not cover all possible information. If you have questions about this medicine, talk to your doctor, pharmacist, or health care provider.  2020 Elsevier/Gold Standard (2015-12-17 08:34:51)

## 2020-03-31 NOTE — Progress Notes (Signed)
   I, Wendy Poet, LAT, ATC, am serving as scribe for Dr. [name].  [name] is a 59 y.o. male who presents to [place] at [facility] today for B knee pain.  He was last seen by Dr. Georgina Snell on 12/24/19 for his R knee and L wrist and had a L wrist injection.  Since his last visit, pt reports that he is unable to sleep due to pain. 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 Pain over medial aspect of knee joint. Patient notices swelling in both knees.   Patient also notes that he has been having dizziness with positional changes. Was sitting in truck and bent forward. Felt dizziness that cleared once he returned to upright position. Mentions that he is suppose to be on some kind of heart medication but he is not taking it.   Diagnostic testing: R knee XR- 12/24/19; B knee XR- 08/12/19   Pertinent review of systems: No fever or chills  Relevant historical information: DJD knee.  Works as a Administrator.   Exam:  BP 118/86   Pulse 68   Ht 6' (1.829 m)   Wt 239 lb (108.4 kg)   SpO2 97%   BMI 32.41 kg/m  General: Well Developed, well nourished, and in no acute distress.   MSK: Knees bilaterally with mild effusion. Tender palpation medial joint line bilaterally. Range of motion 0 to 120 degrees with crepitation bilateral knees. Stable ligamentous exam. Intact strength.    Lab and Radiology Results  EXAM: RIGHT KNEE - COMPLETE 4+ VIEW  COMPARISON:  08/12/2019 6  FINDINGS: Alignment is anatomic. There is no acute fracture. No joint effusion. There is mild joint space narrowing the medial compartment.  IMPRESSION: Mild medial compartment osteoarthritis.   Electronically Signed   By: Macy Mis M.D.   On: 12/25/2019 08:13 I, [name], personally (independently) visualized and performed the interpretation of the images attached in this note.    Assessment and Plan: 59 y.o. male with bilateral knee pain due to DJD.   Symptoms are somewhat mild to moderate.  He is tried typical conservative management including Voltaren gel and compressive sleeve and previous steroid injection.  He would like to avoid future steroid injections as he did not find any very helpful previously.  Discussed options.  Plan for quad strengthening program as well as weight loss.  Additionally discussed possibility of hyaluronic acid injections which we will start authorizing now.  Additionally he has questions about an over-the-counter supplement he took did help his and that he thinks contained in glucosamine and chondroitin.  Stated that this is reasonable if it is providing benefit. Additionally spent time discussing likely outcome was knee DJD including total knee replacement at some point in the future.  We will authorize hyaluronic acid injections and recheck back as needed.    Discussed warning signs or symptoms. Please see discharge instructions. Patient expresses understanding.   The above documentation has been reviewed and is accurate and complete [name]

## 2020-08-10 ENCOUNTER — Ambulatory Visit: Payer: Self-pay

## 2020-08-10 ENCOUNTER — Encounter: Payer: Self-pay | Admitting: Family Medicine

## 2020-08-10 ENCOUNTER — Other Ambulatory Visit: Payer: Self-pay

## 2020-08-10 ENCOUNTER — Ambulatory Visit (INDEPENDENT_AMBULATORY_CARE_PROVIDER_SITE_OTHER): Payer: 59 | Admitting: Family Medicine

## 2020-08-10 VITALS — BP 149/94 | HR 67 | Ht 72.0 in | Wt 225.0 lb

## 2020-08-10 DIAGNOSIS — M25462 Effusion, left knee: Secondary | ICD-10-CM

## 2020-08-10 MED ORDER — TRIAMCINOLONE ACETONIDE 40 MG/ML IJ SUSP
40.0000 mg | Freq: Once | INTRAMUSCULAR | Status: AC
  Administered 2020-08-10: 40 mg via INTRA_ARTICULAR

## 2020-08-10 NOTE — Progress Notes (Signed)
[name] - 59 y.o. male MRN [id number]  Date of birth: 1961/07/21  SUBJECTIVE:  Including CC & ROS.  Chief Complaint  Patient presents with  . Knee Injury    left x 08/09/2020    [name] is a 59 y.o. male that is presenting with acute left knee pain.  His pain has been ongoing for about a week.  It was exacerbated when he was getting out of his truck.  He hit the patella while getting out and has had severe pain since then.  No history of surgery.  No history of similar pain is severe as this..  Independent review of the bilateral knee x-rays from 08/12/2019 show mild medial joint space narrowing.   Review of Systems See HPI   HISTORY: Past Medical, Surgical, Social, and Family History Reviewed & Updated per EMR.   Pertinent Historical Findings include:  Past Medical History:  Diagnosis Date  . Cancer ([place])   . Chronic rhinitis   . Coronary atherosclerosis of unspecified type of vessel, native or graft   . Dysmetabolic syndrome X   . Erectile dysfunction   . Hypertrophy of prostate with urinary obstruction and other lower urinary tract symptoms (LUTS)   . Obstructive sleep apnea (adult) (pediatric)    no CPAP, has lost weight  . Problems related to high-risk sexual behavior   . Pure hypercholesterolemia   . Sickle cell anemia (H[facility])   . Tobacco use disorder     Past Surgical History:  Procedure Laterality Date  . COSMETIC SURGERY    . HERNIA REPAIR     UHR  AT [facility]  <5 YRS AGO   . INCISION AND DRAINAGE ABSCESS / HEMATOMA OF BURSA / KNEE / THIGH     I&D of complex abscess,left axilla. I&D of large infected pilonidal abscess  . LAPAROSCOPIC PARTIAL COLECTOMY N/A 03/10/2015   Procedure: LAPAROSCOPIC ASSISTED RIGHT COLECTOMY;  Surgeon: Rolm Bookbinder, MD;  Location: [place];  Service: General;  Laterality: N/A;  . MASS EXCISION N/A 10/19/2015   Procedure: EXCISION BACK MASS;  Surgeon: Rolm Bookbinder, MD;  Location: [place];  Service: General;   Laterality: N/A;  . PTCA  2010    Family History  Problem Relation Age of Onset  . Breast cancer Mother   . Colon cancer Maternal Grandmother   . Stroke Maternal Grandmother        Over 58  . Colon cancer Maternal Grandfather   . Prostate cancer Maternal Grandfather   . Colon cancer Other        1st degree relative<60  . Heart attack Father        Over 60  . Esophageal cancer Neg Hx   . Stomach cancer Neg Hx   . Rectal cancer Neg Hx     Social History   Socioeconomic History  . Marital status: Divorced    Spouse name: Not on file  . Number of children: 1  . Years of education: Not on file  . Highest education level: Not on file  Occupational History  . Not on file  Tobacco Use  . Smoking status: Current Every Day Smoker    Packs/day: 0.50    Years: 25.00    Pack years: 12.50    Types: Cigarettes  . Smokeless tobacco: Never Used  Substance and Sexual Activity  . Alcohol use: Yes    Alcohol/week: 0.0 standard drinks    Comment: weekends-2-3 drinks  . Drug use: No  .  Sexual activity: Yes    Birth control/protection: Condom  Other Topics Concern  . Not on file  Social History Narrative  . Not on file   Social Determinants of Health   Financial Resource Strain:   . Difficulty of Paying Living Expenses: Not on file  Food Insecurity:   . Worried About Charity fundraiser in the Last Year: Not on file  . Ran Out of Food in the Last Year: Not on file  Transportation Needs:   . Lack of Transportation (Medical): Not on file  . Lack of Transportation (Non-Medical): Not on file  Physical Activity:   . Days of Exercise per Week: Not on file  . Minutes of Exercise per Session: Not on file  Stress:   . Feeling of Stress : Not on file  Social Connections:   . Frequency of Communication with Friends and Family: Not on file  . Frequency of Social Gatherings with Friends and Family: Not on file  . Attends Religious Services: Not on file  . Active Member of Clubs or  Organizations: Not on file  . Attends Archivist Meetings: Not on file  . Marital Status: Not on file  Intimate Partner Violence:   . Fear of Current or Ex-Partner: Not on file  . Emotionally Abused: Not on file  . Physically Abused: Not on file  . Sexually Abused: Not on file     PHYSICAL EXAM:  VS: BP (!) 149/94   Pulse 67   Ht 6' (1.829 m)   Wt 225 lb (102.1 kg)   BMI 30.52 kg/m  Physical Exam Gen: NAD, alert, cooperative with exam, well-appearing MSK:  Left knee: Effusion. Tenderness palpation over the medial joint space. Limited flexion extension. No instability. Neurovascularly intact  Limited ultrasound: Left knee:  Moderate effusion. Normal-appearing quadricep and patellar tendon. Normal joint space appearance in the medial compartment.  Degenerative outpouching of the medial meniscus with significant hyperemia in this area.  The overlying soft tissue of the medial meniscus is reactive. Normal-appearing lateral joint space with minor degenerative change of the lateral meniscus.  Summary: Effusion with degenerative changes as well as significant inflammatory medial joint space.  Ultrasound and interpretation by Clearance Coots, MD   Aspiration/Injection Procedure Note [name] 06-11-1961  Procedure: Aspiration and Injection Indications: Left knee pain  Procedure Details Consent: Risks of procedure as well as the alternatives and risks of each were explained to the (patient/caregiver).  Consent for procedure obtained. Time Out: Verified patient identification, verified procedure, site/side was marked, verified correct patient position, special equipment/implants available, medications/allergies/relevent history reviewed, required imaging and test results available.  Performed.  The area was cleaned with iodine and alcohol swabs.    The Left knee superior lateral suprapatellar pouch was injected using 5 cc of 1% lidocaine on a 25-gauge 1-1/2 inch  needle.  An 18-gauge 1-1/2 inch needle was used for aspiration.  The syringe was switched to mixture containing 1 cc's of 40 mg Kenalog and 4 cc's of 0.25% bupivacaine was injected.  Ultrasound was used. Images were obtained in long views showing the injection.    Amount of Fluid Aspirated: 81mL Character of Fluid: straw colored, red colored and cloudy Fluid was sent ENI:DPOE count and crystal identification  A sterile dressing was applied.  Patient did tolerate procedure well.    ASSESSMENT & PLAN:   Knee effusion, left Has significant inflammatory changes within the medial compartment and a large effusion.  Aspiration was cloudy as opposed  to clear synovial fluid some may have more of a component of inflammatory basis.  Could be gout in origin. -Counseled on supportive care. -Aspiration and injection. -Synovial fluid analysis. -May consider obtaining lab work.

## 2020-08-10 NOTE — Patient Instructions (Signed)
Nice to meet you Please try ice  Please use compression I will call with the results of the fluid    Please send me a message in MyChart with any questions or updates.  Please see me back in 4 weeks.   --Dr. Raeford Razor

## 2020-08-11 ENCOUNTER — Telehealth: Payer: Self-pay | Admitting: Family Medicine

## 2020-08-11 DIAGNOSIS — M25462 Effusion, left knee: Secondary | ICD-10-CM | POA: Insufficient documentation

## 2020-08-11 LAB — SYNOVIAL FLUID, CELL COUNT
Eos, Fluid: 0 %
Lining Cells, Synovial: 0 %
Lymphs, Fluid: 26 %
Macrophages Fld: 40 %
Nuc cell # Fld: 412 cells/uL — ABNORMAL HIGH (ref 0–200)
Polys, Fluid: 34 %
RBC, Fluid: 4000 /uL

## 2020-08-11 NOTE — Telephone Encounter (Signed)
Provided work note.   Rosemarie Ax, MD [facility] 08/11/2020, 2:08 PM

## 2020-08-11 NOTE — Assessment & Plan Note (Signed)
Has significant inflammatory changes within the medial compartment and a large effusion.  Aspiration was cloudy as opposed to clear synovial fluid some may have more of a component of inflammatory basis.  Could be gout in origin. -Counseled on supportive care. -Aspiration and injection. -Synovial fluid analysis. -May consider obtaining lab work.

## 2020-08-11 NOTE — Telephone Encounter (Signed)
Patient called states Knee still very painful to walk on,concerned about returning to work on Wed 9/15-- may need to extend time off for healing / pain to  Decrease.  --Pt ask that provider call him today if possible.  --glh

## 2020-08-12 ENCOUNTER — Telehealth: Payer: Self-pay | Admitting: Family Medicine

## 2020-08-12 NOTE — Telephone Encounter (Signed)
Patient called to say is still in pain & unable to bare any weight on knee & ask if Rx for pain can be sent to :   [id number] - [place], [place]. Phone:  [phone]  Fax:  [phone]     ---Patient also request provider to call him .  --glh

## 2020-08-13 MED ORDER — PREDNISONE 5 MG PO TABS: ORAL_TABLET | ORAL | 0 refills | Status: DC

## 2020-08-13 NOTE — Telephone Encounter (Signed)
Pain has improved some with steroid injection. Synovial analysis was cloudy but negative for crystals. Maybe be more of a synovitis with ongoing pain. Will provide prednisone if pain doesn't improve.   Rosemarie Ax, MD [facility] 08/13/2020, 9:03 AM

## 2020-09-09 ENCOUNTER — Ambulatory Visit: Payer: 59 | Admitting: Family Medicine

## 2020-09-09 NOTE — Progress Notes (Deleted)
[name] - 59 y.o. male MRN [id number]  Date of birth: 1961-02-11  SUBJECTIVE:  Including CC & ROS.  No chief complaint on file.   [name] is a 59 y.o. male that is  ***.  ***   Review of Systems See HPI   HISTORY: Past Medical, Surgical, Social, and Family History Reviewed & Updated per EMR.   Pertinent Historical Findings include:  Past Medical History:  Diagnosis Date  . Cancer ([place])   . Chronic rhinitis   . Coronary atherosclerosis of unspecified type of vessel, native or graft   . Dysmetabolic syndrome X   . Erectile dysfunction   . Hypertrophy of prostate with urinary obstruction and other lower urinary tract symptoms (LUTS)   . Obstructive sleep apnea (adult) (pediatric)    no CPAP, has lost weight  . Problems related to high-risk sexual behavior   . Pure hypercholesterolemia   . Sickle cell anemia (HCC)   . Tobacco use disorder     Past Surgical History:  Procedure Laterality Date  . COSMETIC SURGERY    . HERNIA REPAIR     UHR  AT [facility]  <5 YRS AGO   . INCISION AND DRAINAGE ABSCESS / HEMATOMA OF BURSA / KNEE / THIGH     I&D of complex abscess,left axilla. I&D of large infected pilonidal abscess  . LAPAROSCOPIC PARTIAL COLECTOMY N/A 03/10/2015   Procedure: LAPAROSCOPIC ASSISTED RIGHT COLECTOMY;  Surgeon: Rolm Bookbinder, MD;  Location: [place];  Service: General;  Laterality: N/A;  . MASS EXCISION N/A 10/19/2015   Procedure: EXCISION BACK MASS;  Surgeon: Rolm Bookbinder, MD;  Location: [place];  Service: General;  Laterality: N/A;  . PTCA  2010    Family History  Problem Relation Age of Onset  . Breast cancer Mother   . Colon cancer Maternal Grandmother   . Stroke Maternal Grandmother        Over 48  . Colon cancer Maternal Grandfather   . Prostate cancer Maternal Grandfather   . Colon cancer Other        1st degree relative<60  . Heart attack Father        Over 59  . Esophageal cancer Neg Hx   . Stomach cancer Neg Hx     . Rectal cancer Neg Hx     Social History   Socioeconomic History  . Marital status: Divorced    Spouse name: Not on file  . Number of children: 1  . Years of education: Not on file  . Highest education level: Not on file  Occupational History  . Not on file  Tobacco Use  . Smoking status: Current Every Day Smoker    Packs/day: 0.50    Years: 25.00    Pack years: 12.50    Types: Cigarettes  . Smokeless tobacco: Never Used  Substance and Sexual Activity  . Alcohol use: Yes    Alcohol/week: 0.0 standard drinks    Comment: weekends-2-3 drinks  . Drug use: No  . Sexual activity: Yes    Birth control/protection: Condom  Other Topics Concern  . Not on file  Social History Narrative  . Not on file   Social Determinants of Health   Financial Resource Strain:   . Difficulty of Paying Living Expenses: Not on file  Food Insecurity:   . Worried About Charity fundraiser in the Last Year: Not on file  . Ran Out of Food in the Last Year: Not on file  Transportation Needs:   . Film/video editor (Medical): Not on file  . Lack of Transportation (Non-Medical): Not on file  Physical Activity:   . Days of Exercise per Week: Not on file  . Minutes of Exercise per Session: Not on file  Stress:   . Feeling of Stress : Not on file  Social Connections:   . Frequency of Communication with Friends and Family: Not on file  . Frequency of Social Gatherings with Friends and Family: Not on file  . Attends Religious Services: Not on file  . Active Member of Clubs or Organizations: Not on file  . Attends Archivist Meetings: Not on file  . Marital Status: Not on file  Intimate Partner Violence:   . Fear of Current or Ex-Partner: Not on file  . Emotionally Abused: Not on file  . Physically Abused: Not on file  . Sexually Abused: Not on file     PHYSICAL EXAM:  VS: There were no vitals taken for this visit. Physical Exam Gen: NAD, alert, cooperative with exam,  well-appearing MSK:  ***      ASSESSMENT & PLAN:   No problem-specific Assessment & Plan notes found for this encounter.

## 2020-09-30 ENCOUNTER — Other Ambulatory Visit: Payer: Self-pay

## 2020-09-30 ENCOUNTER — Encounter: Payer: Self-pay | Admitting: Internal Medicine

## 2020-09-30 ENCOUNTER — Ambulatory Visit (INDEPENDENT_AMBULATORY_CARE_PROVIDER_SITE_OTHER): Payer: 59 | Admitting: Internal Medicine

## 2020-09-30 VITALS — BP 126/80 | HR 80 | Temp 98.7°F | Resp 16 | Ht 72.0 in | Wt 234.0 lb

## 2020-09-30 DIAGNOSIS — F17200 Nicotine dependence, unspecified, uncomplicated: Secondary | ICD-10-CM

## 2020-09-30 DIAGNOSIS — Z23 Encounter for immunization: Secondary | ICD-10-CM | POA: Diagnosis not present

## 2020-09-30 DIAGNOSIS — E785 Hyperlipidemia, unspecified: Secondary | ICD-10-CM

## 2020-09-30 DIAGNOSIS — R739 Hyperglycemia, unspecified: Secondary | ICD-10-CM | POA: Insufficient documentation

## 2020-09-30 DIAGNOSIS — Z0000 Encounter for general adult medical examination without abnormal findings: Secondary | ICD-10-CM | POA: Diagnosis not present

## 2020-09-30 DIAGNOSIS — R7303 Prediabetes: Secondary | ICD-10-CM | POA: Diagnosis not present

## 2020-09-30 DIAGNOSIS — R972 Elevated prostate specific antigen [PSA]: Secondary | ICD-10-CM | POA: Diagnosis not present

## 2020-09-30 DIAGNOSIS — K5904 Chronic idiopathic constipation: Secondary | ICD-10-CM | POA: Diagnosis not present

## 2020-09-30 DIAGNOSIS — I2510 Atherosclerotic heart disease of native coronary artery without angina pectoris: Secondary | ICD-10-CM

## 2020-09-30 DIAGNOSIS — N401 Enlarged prostate with lower urinary tract symptoms: Secondary | ICD-10-CM

## 2020-09-30 DIAGNOSIS — R351 Nocturia: Secondary | ICD-10-CM

## 2020-09-30 LAB — LIPID PANEL
Cholesterol: 150 mg/dL (ref 0–200)
HDL: 38 mg/dL — ABNORMAL LOW (ref >39.00)
LDL Cholesterol: 87 mg/dL (ref 0–99)
NonHDL: 112.24
Total CHOL/HDL Ratio: 4
Triglycerides: 128 mg/dL (ref 0.0–149.0)
VLDL: 25.6 mg/dL (ref 0.0–40.0)

## 2020-09-30 LAB — CBC WITH DIFFERENTIAL/PLATELET
Basophils Absolute: 0 10*3/uL (ref 0.0–0.1)
Basophils Relative: 1.1 % (ref 0.0–3.0)
Eosinophils Absolute: 0.2 10*3/uL (ref 0.0–0.7)
Eosinophils Relative: 4.3 % (ref 0.0–5.0)
HCT: 39.2 % (ref 39.0–52.0)
Hemoglobin: 13.2 g/dL (ref 13.0–17.0)
Lymphocytes Relative: 26.5 % (ref 12.0–46.0)
Lymphs Abs: 1.2 10*3/uL (ref 0.7–4.0)
MCHC: 33.8 g/dL (ref 30.0–36.0)
MCV: 84.2 fl (ref 78.0–100.0)
Monocytes Absolute: 0.5 10*3/uL (ref 0.1–1.0)
Monocytes Relative: 11.3 % (ref 3.0–12.0)
Neutro Abs: 2.7 10*3/uL (ref 1.4–7.7)
Neutrophils Relative %: 56.8 % (ref 43.0–77.0)
Platelets: 263 10*3/uL (ref 150.0–400.0)
RBC: 4.66 Mil/uL (ref 4.22–5.81)
RDW: 13.4 % (ref 11.5–15.5)
WBC: 4.7 10*3/uL (ref 4.0–10.5)

## 2020-09-30 LAB — HEPATIC FUNCTION PANEL
ALT: 25 U/L (ref 0–53)
AST: 16 U/L (ref 0–37)
Albumin: 4 g/dL (ref 3.5–5.2)
Alkaline Phosphatase: 76 U/L (ref 39–117)
Bilirubin, Direct: 0.1 mg/dL (ref 0.0–0.3)
Total Bilirubin: 0.3 mg/dL (ref 0.2–1.2)
Total Protein: 6.4 g/dL (ref 6.0–8.3)

## 2020-09-30 LAB — BASIC METABOLIC PANEL
BUN: 12 mg/dL (ref 6–23)
CO2: 28 mEq/L (ref 19–32)
Calcium: 8.7 mg/dL (ref 8.4–10.5)
Chloride: 106 mEq/L (ref 96–112)
Creatinine, Ser: 1.08 mg/dL (ref 0.40–1.50)
GFR: 76.32 mL/min (ref >60.00)
Glucose, Bld: 91 mg/dL (ref 70–99)
Potassium: 3.8 mEq/L (ref 3.5–5.1)
Sodium: 141 mEq/L (ref 135–145)

## 2020-09-30 LAB — URINALYSIS, ROUTINE W REFLEX MICROSCOPIC
Bilirubin Urine: NEGATIVE
Hgb urine dipstick: NEGATIVE
Ketones, ur: NEGATIVE
Leukocytes,Ua: NEGATIVE
Nitrite: NEGATIVE
RBC / HPF: NONE SEEN (ref 0–?)
Specific Gravity, Urine: 1.02 (ref 1.000–1.030)
Total Protein, Urine: NEGATIVE
Urine Glucose: NEGATIVE
Urobilinogen, UA: 0.2 (ref 0.0–1.0)
pH: 6 (ref 5.0–8.0)

## 2020-09-30 LAB — HEMOGLOBIN A1C: Hgb A1c MFr Bld: 6.1 % (ref 4.6–6.5)

## 2020-09-30 LAB — MAGNESIUM: Magnesium: 1.7 mg/dL (ref 1.5–2.5)

## 2020-09-30 LAB — TSH: TSH: 0.67 u[IU]/mL (ref 0.35–4.50)

## 2020-09-30 LAB — PSA: PSA: 2.48 ng/mL (ref 0.10–4.00)

## 2020-09-30 MED ORDER — DUTASTERIDE 0.5 MG PO CAPS: 0.5000 mg | ORAL_CAPSULE | Freq: Every day | ORAL | 1 refills | Status: DC

## 2020-09-30 MED ORDER — ROSUVASTATIN CALCIUM 5 MG PO TABS: 5.0000 mg | ORAL_TABLET | Freq: Every day | ORAL | 1 refills | Status: DC

## 2020-09-30 MED ORDER — LINACLOTIDE 145 MCG PO CAPS: 145.0000 ug | ORAL_CAPSULE | Freq: Every day | ORAL | 1 refills | Status: DC

## 2020-09-30 MED ORDER — ALFUZOSIN HCL ER 10 MG PO TB24: 10.0000 mg | ORAL_TABLET | Freq: Every day | ORAL | 1 refills | Status: DC

## 2020-09-30 NOTE — Patient Instructions (Signed)

## 2020-09-30 NOTE — Progress Notes (Signed)
Subjective:  Patient ID: [name], male    DOB: [birth year]  Age: 59 y.o. MRN: [id number]  CC: Annual Exam, Abdominal Pain, Hyperlipidemia, and Coronary Artery Disease  This visit occurred during the SARS-CoV-2 public health emergency.  Safety protocols were in place, including screening questions prior to the visit, additional usage of staff PPE, and extensive cleaning of exam room while observing appropriate contact time as indicated for disinfecting solutions.    HPI [name] presents for a CPX.  1.  He had an episode of abdominal pain about 4 days ago.  This was preceded by constipation which he had not been treating.  He took laxatives and had a bowel movement and said the abdominal pain resolved.  He denies nausea, vomiting, loss of appetite, or weight loss.  2.  He complains of weak urine stream and nocturia.  He denies dysuria or hematuria.  3.  He has a history of coronary atherosclerosis.  He is active and denies any recent episodes of chest pain, shortness of breath, palpitations, edema, or fatigue.  He is not currently taking a statin.  He continues to smoke cigarettes.  Outpatient Medications Prior to Visit  Medication Sig Dispense Refill  . aspirin EC 81 MG tablet Take 1 tablet (81 mg total) by mouth daily. 90 tablet 1  . Multiple Vitamin (MULTI-VITAMIN) tablet Take by mouth.    . fluticasone (FLONASE) 50 MCG/ACT nasal spray Place 1 spray into both nostrils daily. (Patient not taking: Reported on 09/30/2020)    . [facility] SALINE NASAL SPRAY 0.65 % nasal spray USE 3 4 TIMES PER DAY STARTING DAY AFTER SURGERY AND CONTINUE FOR ONE MONTH (Patient not taking: Reported on 09/30/2020)    . linaclotide (LINZESS) 145 MCG CAPS capsule Take 1 capsule (145 mcg total) by mouth daily before breakfast. (Patient not taking: Reported on 09/30/2020) 84 capsule 0  . neomycin-polymyxin b-dexamethasone (MAXITROL) 3.5-10000-0.1 SUSP INSTILL 1 DROP INTO OPERATIVE EYE THREE TIMES PER DAY FOR 10 DAYS  THEN STOP    . predniSONE (DELTASONE) 5 MG tablet Take 6 pills for first day, 5 pills second day, 4 pills third day, 3 pills fourth day, 2 pills the fifth day, and 1 pill sixth day. 21 tablet 0   No facility-administered medications prior to visit.    ROS Review of Systems  Constitutional: Negative.  Negative for appetite change, diaphoresis, fatigue and unexpected weight change.  HENT: Negative.   Eyes: Negative for visual disturbance.  Respiratory: Negative for cough, chest tightness, shortness of breath and wheezing.   Cardiovascular: Negative for chest pain, palpitations and leg swelling.  Gastrointestinal: Positive for constipation. Negative for abdominal pain, blood in stool, diarrhea, nausea and vomiting.  Endocrine: Negative.   Genitourinary: Positive for difficulty urinating. Negative for dysuria, frequency, genital sores, hematuria, scrotal swelling, testicular pain and urgency.  Musculoskeletal: Negative.  Negative for arthralgias and myalgias.  Skin: Negative.  Negative for color change, pallor and rash.  Neurological: Negative.  Negative for dizziness, weakness, light-headedness, numbness and headaches.  Hematological: Negative for adenopathy. Does not bruise/bleed easily.  Psychiatric/Behavioral: Negative.     Objective:  BP 126/80 (BP Location: Left Arm, Patient Position: Sitting, Cuff Size: Large)   Pulse 80   Temp 98.7 F (37.1 C) (Oral)   Resp 16   Ht 6' (1.829 m)   Wt 234 lb (106.1 kg)   SpO2 99%   BMI 31.74 kg/m   BP Readings from Last 3 Encounters:  09/30/20 126/80  08/10/20 Marland Kitchen)  149/94  03/31/20 118/86    Wt Readings from Last 3 Encounters:  09/30/20 234 lb (106.1 kg)  08/10/20 225 lb (102.1 kg)  03/31/20 239 lb (108.4 kg)    Physical Exam Vitals reviewed.  Constitutional:      Appearance: Normal appearance. He is well-developed.  HENT:     Nose: Nose normal.     Mouth/Throat:     Mouth: Mucous membranes are moist.  Eyes:     General: No  scleral icterus.    Extraocular Movements: Extraocular movements intact.  Cardiovascular:     Rate and Rhythm: Normal rate and regular rhythm.     Heart sounds: Normal heart sounds, S1 normal and S2 normal. No murmur heard.  No gallop.      Comments: EKG- NSR, 64 bpm Increased R/S ratio in V1 - no significant change compared to the prior EKG No Q waves  Pulmonary:     Effort: Pulmonary effort is normal.     Breath sounds: No stridor. No wheezing, rhonchi or rales.  Abdominal:     General: Abdomen is flat. Bowel sounds are normal. There is no distension.     Palpations: Abdomen is soft. There is no hepatomegaly, splenomegaly or mass.     Tenderness: There is no abdominal tenderness.     Hernia: No hernia is present. There is no hernia in the left inguinal area or right inguinal area.  Genitourinary:    Pubic Area: No rash.      Penis: Normal and circumcised. No discharge, swelling or lesions.      Testes: Normal.        Right: Mass not present.        Left: Mass not present.     Epididymis:     Right: Normal.     Left: Normal.     Prostate: Enlarged (2+ smooth symm BPH). Not tender and no nodules present.     Rectum: Normal. Guaiac result negative. No mass, tenderness, anal fissure, external hemorrhoid or internal hemorrhoid. Normal anal tone.  Musculoskeletal:        General: Normal range of motion.     Right lower leg: No edema.     Left lower leg: No edema.  Lymphadenopathy:     Lower Body: No right inguinal adenopathy. No left inguinal adenopathy.  Skin:    General: Skin is warm and dry.     Coloration: Skin is not pale.  Neurological:     General: No focal deficit present.     Mental Status: He is alert and oriented to person, place, and time. Mental status is at baseline.  Psychiatric:        Mood and Affect: Mood normal.        Behavior: Behavior normal.     Lab Results  Component Value Date   WBC 4.7 09/30/2020   HGB 13.2 09/30/2020   HCT 39.2 09/30/2020    PLT 263.0 09/30/2020   GLUCOSE 91 09/30/2020   CHOL 150 09/30/2020   TRIG 128.0 09/30/2020   HDL 38.00 (L) 09/30/2020   LDLDIRECT 132.4 07/29/2008   LDLCALC 87 09/30/2020   ALT 25 09/30/2020   AST 16 09/30/2020   NA 141 09/30/2020   K 3.8 09/30/2020   CL 106 09/30/2020   CREATININE 1.08 09/30/2020   BUN 12 09/30/2020   CO2 28 09/30/2020   TSH 0.67 09/30/2020   PSA 2.48 09/30/2020   HGBA1C 6.1 09/30/2020   MICROALBUR 1.2 03/22/2012    DG  Cervical Spine 2 or 3 views  Result Date: 10/11/2019 CLINICAL DATA:  Cervical spine pain. EXAM: CERVICAL SPINE - 2-3 VIEW COMPARISON:  MRI 12/30/2013. FINDINGS: Diffuse multilevel degenerative change. No acute bony abnormality identified. No evidence of fracture or dislocation. Pulmonary apices are clear. IMPRESSION: Diffuse multilevel degenerative change. No acute abnormality identified. Electronically Signed   By: Marcello Moores  Register   On: 10/11/2019 09:06    Assessment & Plan:   Niccolo was seen today for annual exam, abdominal pain, hyperlipidemia and coronary artery disease.  Diagnoses and all orders for this visit:  Hyperlipidemia with target LDL less than 70- He has an elevated ASCVD risk score so I have asked him to take a statin for CV risk reduction. -     CBC with Differential/Platelet; Future -     TSH; Future -     Hepatic function panel; Future -     Hepatic function panel -     TSH -     CBC with Differential/Platelet -     rosuvastatin (CRESTOR) 5 MG tablet; Take 1 tablet (5 mg total) by mouth daily.  PSA elevation- His PSA has trended down over the last year which is a reassuring sign that he does not have prostate cancer.  He is symptomatic with the BPH so I recommended that he start taking a peripheral alpha-blocker and a 5-alpha-reductase inhibitor. -     Urinalysis, Routine w reflex microscopic; Future -     Urinalysis, Routine w reflex microscopic  Routine general medical examination at a health care facility- Exam  completed, labs reviewed, vaccines reviewed and updated, cancer screenings are up-to-date, patient education was given. -     Lipid panel; Future -     PSA; Future -     PSA -     Lipid panel  Hyperglycemia  Prediabetes- His A1c is up to 6.1%.  Medical therapy is not yet indicated.  He agrees to improve his lifestyle modifications. -     Basic metabolic panel; Future -     Hemoglobin A1c; Future -     Hemoglobin A1c -     Basic metabolic panel  Chronic idiopathic constipation- Labs and review of medications are negative for any secondary causes for constipation.  I recommended that he start taking linaclotide again. -     linaclotide (LINZESS) 145 MCG CAPS capsule; Take 1 capsule (145 mcg total) by mouth daily before breakfast. -     Magnesium; Future -     Magnesium  Benign prostatic hyperplasia with nocturia -     alfuzosin (UROXATRAL) 10 MG 24 hr tablet; Take 1 tablet (10 mg total) by mouth daily with breakfast. -     dutasteride (AVODART) 0.5 MG capsule; Take 1 capsule (0.5 mg total) by mouth daily.  Atherosclerosis of native coronary artery of native heart without angina pectoris- His EKG is negative for ischemia.  Will address risk factor modifications. -     EKG 12-Lead -     rosuvastatin (CRESTOR) 5 MG tablet; Take 1 tablet (5 mg total) by mouth daily.  TOBACCO ABUSE- I have asked him to smoke stop smoking and to be screened for lung cancer. -     Ambulatory Referral for Lung Cancer Scre  Other orders -     Flu Vaccine QUAD 6+ mos PF IM (Fluarix Quad PF)   I have discontinued Alvester Chou T. [facility]ise's CVS Saline Nasal Spray, neomycin-polymyxin b-dexamethasone, and predniSONE. I am also having him start on alfuzosin,  dutasteride, and rosuvastatin. Additionally, I am having him maintain his aspirin EC, fluticasone, Multi-Vitamin, and linaclotide.  Meds ordered this encounter  Medications  . linaclotide (LINZESS) 145 MCG CAPS capsule    Sig: Take 1 capsule (145 mcg total) by mouth  daily before breakfast.    Dispense:  90 capsule    Refill:  1  . alfuzosin (UROXATRAL) 10 MG 24 hr tablet    Sig: Take 1 tablet (10 mg total) by mouth daily with breakfast.    Dispense:  90 tablet    Refill:  1  . dutasteride (AVODART) 0.5 MG capsule    Sig: Take 1 capsule (0.5 mg total) by mouth daily.    Dispense:  90 capsule    Refill:  1  . rosuvastatin (CRESTOR) 5 MG tablet    Sig: Take 1 tablet (5 mg total) by mouth daily.    Dispense:  90 tablet    Refill:  1   In addition to time spent on CPE, I spent 50 minutes in preparing to see the patient by review of recent labs, imaging and procedures, obtaining and reviewing separately obtained history, communicating with the patient and family or caregiver, ordering medications, tests or procedures, and documenting clinical information in the EHR including the differential Dx, treatment, and any further evaluation and other management of 1. Hyperlipidemia with target LDL less than 70 2. PSA elevation 3. Prediabetes 4. Chronic idiopathic constipation 5. Benign prostatic hyperplasia with nocturia 6. Atherosclerosis of native coronary artery of native heart without angina pectoris 7. TOBACCO ABUSE     Follow-up: Return in about 6 months (around 03/30/2021).  Scarlette Calico, MD

## 2021-05-25 ENCOUNTER — Other Ambulatory Visit: Payer: Self-pay

## 2021-05-25 ENCOUNTER — Encounter: Payer: Self-pay | Admitting: Internal Medicine

## 2021-05-25 ENCOUNTER — Ambulatory Visit (INDEPENDENT_AMBULATORY_CARE_PROVIDER_SITE_OTHER): Payer: 59 | Admitting: Internal Medicine

## 2021-05-25 DIAGNOSIS — L259 Unspecified contact dermatitis, unspecified cause: Secondary | ICD-10-CM

## 2021-05-25 DIAGNOSIS — R739 Hyperglycemia, unspecified: Secondary | ICD-10-CM

## 2021-05-25 DIAGNOSIS — K219 Gastro-esophageal reflux disease without esophagitis: Secondary | ICD-10-CM | POA: Diagnosis not present

## 2021-05-25 DIAGNOSIS — K5904 Chronic idiopathic constipation: Secondary | ICD-10-CM

## 2021-05-25 MED ORDER — PREDNISONE 10 MG PO TABS: ORAL_TABLET | ORAL | 0 refills | Status: DC

## 2021-05-25 MED ORDER — TRIAMCINOLONE ACETONIDE 0.1 % EX CREA: 1.0000 "application " | TOPICAL_CREAM | Freq: Two times a day (BID) | CUTANEOUS | 0 refills | Status: DC

## 2021-05-25 NOTE — Progress Notes (Signed)
Patient ID: [name], male   DOB: 1961-11-27, 60 y.o.   MRN: [id number]        Chief Complaint: follow up rash, and bloating/constipation       HPI:  [name] is a 60 y.o. male here with c/o 3 days onset bumpy markely itchy rash to the arms after working in the yard, without fever, chill, trauma or tongue or lip swelling, and Pt denies chest pain, increased sob or doe, wheezing, orthopnea, PND, increased LE swelling, palpitations, dizziness or syncope.   Pt denies polydipsia, polyuria, or new focal neuro s/s.  Denies worsening reflux, abd pain, dysphagia, n/v, or blood, but has recent worsening bloating and constipation for over 2 weeks.  Has colonoscopy due feb 2023 with Dr Hilarie Fredrickson.  Admits to not drinking enough fluids most days, and could be more active.     TUMS for reflux seems to work ok for mild symtpoms.  And Denies worsening abd pain, dysphagia, n/v, or blood. Wt Readings from Last 3 Encounters:  05/25/21 236 lb (107 kg)  09/30/20 234 lb (106.1 kg)  08/10/20 225 lb (102.1 kg)   BP Readings from Last 3 Encounters:  05/25/21 138/78  09/30/20 126/80  08/10/20 (!) 149/94         Past Medical History:  Diagnosis Date   Cancer ([place])    Chronic rhinitis    Coronary atherosclerosis of unspecified type of vessel, native or graft    Dysmetabolic syndrome X    Erectile dysfunction    Hypertrophy of prostate with urinary obstruction and other lower urinary tract symptoms (LUTS)    Obstructive sleep apnea (adult) (pediatric)    no CPAP, has lost weight   Problems related to high-risk sexual behavior    Pure hypercholesterolemia    Sickle cell anemia (HCC)    Tobacco use disorder    Past Surgical History:  Procedure Laterality Date   COSMETIC SURGERY     HERNIA REPAIR     UHR  AT [facility]  <5 YRS AGO    INCISION AND DRAINAGE ABSCESS / HEMATOMA OF BURSA / KNEE / THIGH     I&D of complex abscess,left axilla. I&D of large infected pilonidal abscess   LAPAROSCOPIC PARTIAL  COLECTOMY N/A 03/10/2015   Procedure: LAPAROSCOPIC ASSISTED RIGHT COLECTOMY;  Surgeon: Rolm Bookbinder, MD;  Location: [place];  Service: General;  Laterality: N/A;   MASS EXCISION N/A 10/19/2015   Procedure: EXCISION BACK MASS;  Surgeon: Rolm Bookbinder, MD;  Location: [place];  Service: General;  Laterality: N/A;   PTCA  2010    reports that he has been smoking cigarettes. He has a 12.50 pack-year smoking history. He has never used smokeless tobacco. He reports current alcohol use. He reports that he does not use drugs. family history includes Breast cancer in his mother; Colon cancer in his maternal grandfather, maternal grandmother, and another family member; Heart attack in his father; Prostate cancer in his maternal grandfather; Stroke in his maternal grandmother. Allergies  Allergen Reactions   Penicillins Hives and Itching   Current Outpatient Medications on File Prior to Visit  Medication Sig Dispense Refill   aspirin EC 81 MG tablet Take 1 tablet (81 mg total) by mouth daily. 90 tablet 1   Multiple Vitamin (MULTI-VITAMIN) tablet Take by mouth.     alfuzosin (UROXATRAL) 10 MG 24 hr tablet Take 1 tablet (10 mg total) by mouth daily with breakfast. (Patient not taking: Reported on 05/25/2021) 90 tablet 1  dutasteride (AVODART) 0.5 MG capsule Take 1 capsule (0.5 mg total) by mouth daily. (Patient not taking: Reported on 05/25/2021) 90 capsule 1   fluticasone (FLONASE) 50 MCG/ACT nasal spray Place 1 spray into both nostrils daily. (Patient not taking: No sig reported)     linaclotide (LINZESS) 145 MCG CAPS capsule Take 1 capsule (145 mcg total) by mouth daily before breakfast. (Patient not taking: Reported on 05/25/2021) 90 capsule 1   rosuvastatin (CRESTOR) 5 MG tablet Take 1 tablet (5 mg total) by mouth daily. (Patient not taking: Reported on 05/25/2021) 90 tablet 1   No current facility-administered medications on file prior to visit.        ROS:  All others reviewed  and negative.  Objective        PE:  BP 138/78 (BP Location: Left Arm, Patient Position: Sitting, Cuff Size: Large)   Pulse 60   Temp 98.6 F (37 C) (Oral)   Ht 6' (1.829 m)   Wt 236 lb (107 kg)   SpO2 97%   BMI 32.01 kg/m                 Constitutional: Pt appears in NAD               HENT: Head: NCAT.                Right Ear: External ear normal.                 Left Ear: External ear normal.                Eyes: . Pupils are equal, round, and reactive to light. Conjunctivae and EOM are normal               Nose: without d/c or deformity               Neck: Neck supple. Gross normal ROM               Cardiovascular: Normal rate and regular rhythm.                 Pulmonary/Chest: Effort normal and breath sounds without rales or wheezing.                Abd:  Soft, NT, ND, + BS, no organomegaly               Neurological: Pt is alert. At baseline orientation, motor grossly intact               Skin: LE edema - none, numerous erythem itchy lesions to arms with 2 in particular linear to the post right mid arm.               Psychiatric: Pt behavior is normal without agitation   Micro: none  Cardiac tracings I have personally interpreted today:  none  Pertinent Radiological findings (summarize): none   Lab Results  Component Value Date   WBC 4.7 09/30/2020   HGB 13.2 09/30/2020   HCT 39.2 09/30/2020   PLT 263.0 09/30/2020   GLUCOSE 91 09/30/2020   CHOL 150 09/30/2020   TRIG 128.0 09/30/2020   HDL 38.00 (L) 09/30/2020   LDLDIRECT 132.4 07/29/2008   LDLCALC 87 09/30/2020   ALT 25 09/30/2020   AST 16 09/30/2020   NA 141 09/30/2020   K 3.8 09/30/2020   CL 106 09/30/2020   CREATININE 1.08 09/30/2020   BUN 12 09/30/2020   CO2 28 09/30/2020  TSH 0.67 09/30/2020   PSA 2.48 09/30/2020   HGBA1C 6.1 09/30/2020   MICROALBUR 1.2 03/22/2012   Assessment/Plan:  MADIX BLOWE is a 60 y.o. Black or African American [2] male with  has a past medical history of Cancer ([place]),  Chronic rhinitis, Coronary atherosclerosis of unspecified type of vessel, native or graft, Dysmetabolic syndrome X, Erectile dysfunction, Hypertrophy of prostate with urinary obstruction and other lower urinary tract symptoms (LUTS), Obstructive sleep apnea (adult) (pediatric), Problems related to high-risk sexual behavior, Pure hypercholesterolemia, Sickle cell anemia ([place]), and Tobacco use disorder.  Contact dermatitis Mild, for prednisone and topical steroid cream,  to f/u any worsening symptoms or concerns  Hyperglycemia Lab Results  Component Value Date   HGBA1C 6.1 09/30/2020   Stable, pt to continue current medical treatment  - diet   Chronic idiopathic constipation For increased activity and hydration, f/u colonoscopy as planned,  to f/u any worsening symptoms or concerns  Gastroesophageal reflux disease without esophagitis Mild uncontrolled, for tums prn,  to f/u any worsening symptoms or concerns  Followup: Return in about 3 months (around 08/25/2021).  Cathlean Cower, MD 05/29/2021 11:11 PM [facility]

## 2021-05-25 NOTE — Patient Instructions (Addendum)
Please remember you are due to see Dr Hilarie Fredrickson for follow up colonoscopy in Feb 2023 - please call for referral about 3 mo ahead so this can be set up  [facility] to continue hydration and metamucil for the bowel symptoms  Please take all new medication as prescribe - the prednisone and the cream  Please continue all other medications as before, and refills have been done if requested.  Please have the pharmacy call with any other refills you may need.  Please continue your efforts at being more active, low cholesterol diet, and weight control.  Please keep your appointments with your specialists as you may have planned  Please see Dr Ronnald Ramp in follow up in 3 months

## 2021-05-29 ENCOUNTER — Encounter: Payer: Self-pay | Admitting: Internal Medicine

## 2021-05-29 DIAGNOSIS — L259 Unspecified contact dermatitis, unspecified cause: Secondary | ICD-10-CM | POA: Insufficient documentation

## 2021-05-29 NOTE — Assessment & Plan Note (Signed)
Mild uncontrolled, for tums prn,  to f/u any worsening symptoms or concerns

## 2021-05-29 NOTE — Assessment & Plan Note (Signed)
For increased activity and hydration, f/u colonoscopy as planned,  to f/u any worsening symptoms or concerns

## 2021-05-29 NOTE — Assessment & Plan Note (Signed)
Lab Results  Component Value Date   HGBA1C 6.1 09/30/2020   Stable, pt to continue current medical treatment  - diet

## 2021-05-29 NOTE — Assessment & Plan Note (Signed)
Mild, for prednisone and topical steroid cream,  to f/u any worsening symptoms or concerns

## 2021-10-12 ENCOUNTER — Ambulatory Visit: Payer: 59 | Attending: Internal Medicine

## 2021-10-12 ENCOUNTER — Other Ambulatory Visit (HOSPITAL_BASED_OUTPATIENT_CLINIC_OR_DEPARTMENT_OTHER): Payer: Self-pay

## 2021-10-12 DIAGNOSIS — Z23 Encounter for immunization: Secondary | ICD-10-CM

## 2021-10-12 MED ORDER — INFLUENZA VAC SPLIT QUAD 0.5 ML IM SUSY
PREFILLED_SYRINGE | INTRAMUSCULAR | 0 refills | Status: DC
  Filled 2021-10-12: qty 0.5, 1d supply, fill #0

## 2021-10-12 MED ORDER — PFIZER COVID-19 VAC BIVALENT 30 MCG/0.3ML IM SUSP
INTRAMUSCULAR | 0 refills | Status: DC
  Filled 2021-10-12: qty 0.3, 1d supply, fill #0

## 2021-10-12 NOTE — Progress Notes (Signed)
   Covid-19 Vaccination Clinic  Name:  [name]    MRN: [id number] DOB: 07-Apr-1961  10/12/2021  [name] was observed post Covid-19 immunization for 15 minutes without incident. He was provided with Vaccine Information Sheet and instruction to access the V-Safe system.   [name] was instructed to call 911 with any severe reactions post vaccine: Difficulty breathing  Swelling of face and throat  A fast heartbeat  A bad rash all over body  Dizziness and weakness   Immunizations Administered     Name Date Dose VIS Date Route   Pfizer Covid-19 Vaccine Bivalent Booster 10/12/2021 10:17 AM 0.3 mL 07/28/2021 Intramuscular   Manufacturer: [place]   Lot: [id number]   [place]: [phone]

## 2021-11-03 ENCOUNTER — Other Ambulatory Visit: Payer: Self-pay

## 2021-11-03 ENCOUNTER — Encounter: Payer: Self-pay | Admitting: Internal Medicine

## 2021-11-03 ENCOUNTER — Ambulatory Visit (INDEPENDENT_AMBULATORY_CARE_PROVIDER_SITE_OTHER): Payer: 59 | Admitting: Internal Medicine

## 2021-11-03 VITALS — BP 126/78 | HR 75 | Temp 98.3°F | Ht 72.0 in | Wt 242.0 lb

## 2021-11-03 DIAGNOSIS — N401 Enlarged prostate with lower urinary tract symptoms: Secondary | ICD-10-CM

## 2021-11-03 DIAGNOSIS — F17200 Nicotine dependence, unspecified, uncomplicated: Secondary | ICD-10-CM

## 2021-11-03 DIAGNOSIS — K219 Gastro-esophageal reflux disease without esophagitis: Secondary | ICD-10-CM | POA: Diagnosis not present

## 2021-11-03 DIAGNOSIS — R7303 Prediabetes: Secondary | ICD-10-CM | POA: Diagnosis not present

## 2021-11-03 DIAGNOSIS — E785 Hyperlipidemia, unspecified: Secondary | ICD-10-CM | POA: Diagnosis not present

## 2021-11-03 DIAGNOSIS — I2510 Atherosclerotic heart disease of native coronary artery without angina pectoris: Secondary | ICD-10-CM | POA: Diagnosis not present

## 2021-11-03 DIAGNOSIS — Z0000 Encounter for general adult medical examination without abnormal findings: Secondary | ICD-10-CM | POA: Diagnosis not present

## 2021-11-03 DIAGNOSIS — M170 Bilateral primary osteoarthritis of knee: Secondary | ICD-10-CM

## 2021-11-03 DIAGNOSIS — K5904 Chronic idiopathic constipation: Secondary | ICD-10-CM

## 2021-11-03 DIAGNOSIS — N3281 Overactive bladder: Secondary | ICD-10-CM | POA: Insufficient documentation

## 2021-11-03 DIAGNOSIS — R351 Nocturia: Secondary | ICD-10-CM

## 2021-11-03 DIAGNOSIS — G4733 Obstructive sleep apnea (adult) (pediatric): Secondary | ICD-10-CM

## 2021-11-03 DIAGNOSIS — I10 Essential (primary) hypertension: Secondary | ICD-10-CM | POA: Insufficient documentation

## 2021-11-03 HISTORY — DX: Essential (primary) hypertension: I10

## 2021-11-03 LAB — CBC WITH DIFFERENTIAL/PLATELET
Basophils Absolute: 0 10*3/uL (ref 0.0–0.1)
Basophils Relative: 0.7 % (ref 0.0–3.0)
Eosinophils Absolute: 0.2 10*3/uL (ref 0.0–0.7)
Eosinophils Relative: 3 % (ref 0.0–5.0)
HCT: 43 % (ref 39.0–52.0)
Hemoglobin: 14.1 g/dL (ref 13.0–17.0)
Lymphocytes Relative: 22.1 % (ref 12.0–46.0)
Lymphs Abs: 1.1 10*3/uL (ref 0.7–4.0)
MCHC: 32.8 g/dL (ref 30.0–36.0)
MCV: 85.1 fl (ref 78.0–100.0)
Monocytes Absolute: 0.6 10*3/uL (ref 0.1–1.0)
Monocytes Relative: 11.2 % (ref 3.0–12.0)
Neutro Abs: 3.1 10*3/uL (ref 1.4–7.7)
Neutrophils Relative %: 63 % (ref 43.0–77.0)
Platelets: 242 10*3/uL (ref 150.0–400.0)
RBC: 5.05 Mil/uL (ref 4.22–5.81)
RDW: 13.5 % (ref 11.5–15.5)
WBC: 5 10*3/uL (ref 4.0–10.5)

## 2021-11-03 LAB — URINALYSIS, ROUTINE W REFLEX MICROSCOPIC
Bilirubin Urine: NEGATIVE
Hgb urine dipstick: NEGATIVE
Ketones, ur: NEGATIVE
Leukocytes,Ua: NEGATIVE
Nitrite: NEGATIVE
RBC / HPF: NONE SEEN (ref 0–?)
Specific Gravity, Urine: 1.01 (ref 1.000–1.030)
Total Protein, Urine: NEGATIVE
Urine Glucose: NEGATIVE
Urobilinogen, UA: 1 (ref 0.0–1.0)
pH: 7.5 (ref 5.0–8.0)

## 2021-11-03 LAB — BASIC METABOLIC PANEL
BUN: 10 mg/dL (ref 6–23)
CO2: 31 mEq/L (ref 19–32)
Calcium: 9.1 mg/dL (ref 8.4–10.5)
Chloride: 106 mEq/L (ref 96–112)
Creatinine, Ser: 1.14 mg/dL (ref 0.40–1.50)
GFR: 70.98 mL/min (ref >60.00)
Glucose, Bld: 89 mg/dL (ref 70–99)
Potassium: 4.4 mEq/L (ref 3.5–5.1)
Sodium: 141 mEq/L (ref 135–145)

## 2021-11-03 LAB — HEPATIC FUNCTION PANEL
ALT: 21 U/L (ref 0–53)
AST: 21 U/L (ref 0–37)
Albumin: 4.2 g/dL (ref 3.5–5.2)
Alkaline Phosphatase: 95 U/L (ref 39–117)
Bilirubin, Direct: 0.1 mg/dL (ref 0.0–0.3)
Total Bilirubin: 0.8 mg/dL (ref 0.2–1.2)
Total Protein: 6.8 g/dL (ref 6.0–8.3)

## 2021-11-03 LAB — LIPID PANEL
Cholesterol: 168 mg/dL (ref 0–200)
HDL: 50.9 mg/dL (ref >39.00)
LDL Cholesterol: 93 mg/dL (ref 0–99)
NonHDL: 117.4
Total CHOL/HDL Ratio: 3
Triglycerides: 122 mg/dL (ref 0.0–149.0)
VLDL: 24.4 mg/dL (ref 0.0–40.0)

## 2021-11-03 LAB — PSA: PSA: 3.08 ng/mL (ref 0.10–4.00)

## 2021-11-03 LAB — HEMOGLOBIN A1C: Hgb A1c MFr Bld: 6.2 % (ref 4.6–6.5)

## 2021-11-03 LAB — TSH: TSH: 1.08 u[IU]/mL (ref 0.35–5.50)

## 2021-11-03 MED ORDER — ALFUZOSIN HCL ER 10 MG PO TB24: 10.0000 mg | ORAL_TABLET | Freq: Every day | ORAL | 0 refills | Status: DC

## 2021-11-03 MED ORDER — ESOMEPRAZOLE MAGNESIUM 40 MG PO CPDR: 40.0000 mg | DELAYED_RELEASE_CAPSULE | Freq: Every day | ORAL | 1 refills | Status: DC

## 2021-11-03 MED ORDER — ASPIRIN EC 81 MG PO TBEC: 81.0000 mg | DELAYED_RELEASE_TABLET | Freq: Every day | ORAL | 1 refills | Status: DC

## 2021-11-03 MED ORDER — LUBIPROSTONE 24 MCG PO CAPS: 24.0000 ug | ORAL_CAPSULE | Freq: Two times a day (BID) | ORAL | 1 refills | Status: DC

## 2021-11-03 MED ORDER — MIRABEGRON ER 50 MG PO TB24: 50.0000 mg | ORAL_TABLET | Freq: Every day | ORAL | 1 refills | Status: DC

## 2021-11-03 NOTE — Patient Instructions (Signed)

## 2021-11-03 NOTE — Progress Notes (Signed)
Subjective:  Patient ID: [name], male    DOB: 15-May-1961  Age: 60 y.o. MRN: [id number]  CC: Annual Exam  This visit occurred during the SARS-CoV-2 public health emergency.  Safety protocols were in place, including screening questions prior to the visit, additional usage of staff PPE, and extensive cleaning of exam room while observing appropriate contact time as indicated for disinfecting solutions.    HPI [name] presents for a CPX and f/up -   He complains of urinary frequency and nocturia.  He complains of constipation and bloating.  He says he has a bowel movement about every 4 days.  He has not gotten much symptom relief with the stool softener and probiotic.  He complains of heartburn and pill dysphagia.  He has gotten some symptom relief with baking soda.  He takes ibuprofen intermittently.  He is not currently taking a PPI.  Outpatient Medications Prior to Visit  Medication Sig Dispense Refill   Multiple Vitamin (MULTI-VITAMIN) tablet Take by mouth.     alfuzosin (UROXATRAL) 10 MG 24 hr tablet Take 1 tablet (10 mg total) by mouth daily with breakfast. 90 tablet 1   aspirin EC 81 MG tablet Take 1 tablet (81 mg total) by mouth daily. 90 tablet 1   COVID-19 mRNA bivalent vaccine, Pfizer, (PFIZER COVID-19 VAC BIVALENT) injection Inject into the muscle. 0.3 mL 0   dutasteride (AVODART) 0.5 MG capsule Take 1 capsule (0.5 mg total) by mouth daily. 90 capsule 1   fluticasone (FLONASE) 50 MCG/ACT nasal spray Place 1 spray into both nostrils daily.     influenza vac split quadrivalent PF (FLUARIX) 0.5 ML injection Inject into the muscle. 0.5 mL 0   linaclotide (LINZESS) 145 MCG CAPS capsule Take 1 capsule (145 mcg total) by mouth daily before breakfast. 90 capsule 1   predniSONE (DELTASONE) 10 MG tablet 2 tabs by mouth per day for 5 days 10 tablet 0   rosuvastatin (CRESTOR) 5 MG tablet Take 1 tablet (5 mg total) by mouth daily. 90 tablet 1   triamcinolone cream (KENALOG) 0.1 %  Apply 1 application topically 2 (two) times daily. 30 g 0   No facility-administered medications prior to visit.    ROS Review of Systems  Constitutional:  Positive for unexpected weight change (wt gain). Negative for chills, diaphoresis and fatigue.  HENT:  Positive for trouble swallowing. Negative for sore throat and voice change.   Respiratory:  Positive for apnea and chest tightness. Negative for cough, shortness of breath and wheezing.        Right chest tightness at rest  Cardiovascular:  Negative for chest pain, palpitations and leg swelling.  Gastrointestinal:  Positive for constipation. Negative for abdominal distention, abdominal pain, blood in stool, diarrhea, nausea, rectal pain and vomiting.  Genitourinary:  Positive for frequency. Negative for difficulty urinating, dysuria, flank pain, hematuria, penile swelling, scrotal swelling, testicular pain and urgency.  Musculoskeletal: Negative.  Negative for arthralgias, myalgias and neck stiffness.  Skin: Negative.  Negative for color change and rash.  Neurological:  Negative for dizziness, weakness, light-headedness, numbness and headaches.  Hematological:  Negative for adenopathy. Does not bruise/bleed easily.  Psychiatric/Behavioral:  Positive for sleep disturbance. Negative for dysphoric mood and suicidal ideas. The patient is not nervous/anxious and is not hyperactive.    Objective:  BP 126/78 (BP Location: Left Arm, Patient Position: Sitting, Cuff Size: Large)   Pulse 75   Temp 98.3 F (36.8 C) (Oral)   Ht 6' (1.829 m)  Wt 242 lb (109.8 kg)   SpO2 96%   BMI 32.82 kg/m   BP Readings from Last 3 Encounters:  11/03/21 126/78  05/25/21 138/78  09/30/20 126/80    Wt Readings from Last 3 Encounters:  11/03/21 242 lb (109.8 kg)  05/25/21 236 lb (107 kg)  09/30/20 234 lb (106.1 kg)    Physical Exam Vitals reviewed.  Constitutional:      Appearance: Normal appearance. He is not ill-appearing.  HENT:     Nose: Nose  normal.     Mouth/Throat:     Mouth: Mucous membranes are moist.  Eyes:     General: No scleral icterus.    Conjunctiva/sclera: Conjunctivae normal.  Cardiovascular:     Rate and Rhythm: Normal rate and regular rhythm.     Heart sounds: Normal heart sounds, S1 normal and S2 normal. No murmur heard.   No friction rub.     Comments: EKG- NSR, 60 bpm Normal EKG Pulmonary:     Effort: Pulmonary effort is normal.     Breath sounds: No stridor. No wheezing, rhonchi or rales.  Abdominal:     General: Abdomen is flat. Bowel sounds are normal. There is no distension.     Palpations: Abdomen is soft. There is no hepatomegaly, splenomegaly or mass.     Tenderness: There is no abdominal tenderness. There is no guarding.     Hernia: No hernia is present. There is no hernia in the left inguinal area or right inguinal area.  Genitourinary:    Pubic Area: No rash.      Penis: Normal and circumcised.      Testes: Normal.     Epididymis:     Right: Normal. Not inflamed or enlarged. No mass.     Left: Normal. Not inflamed or enlarged. No mass.     Prostate: Enlarged. Not tender and no nodules present.     Rectum: Guaiac result negative. No mass, tenderness, anal fissure, external hemorrhoid or internal hemorrhoid. Normal anal tone.  Musculoskeletal:     Cervical back: Neck supple.     Right lower leg: No edema.     Left lower leg: No edema.  Lymphadenopathy:     Cervical: No cervical adenopathy.     Lower Body: No right inguinal adenopathy. No left inguinal adenopathy.  Skin:    General: Skin is warm and dry.     Findings: No rash.  Neurological:     General: No focal deficit present.     Mental Status: He is alert.  Psychiatric:        Mood and Affect: Mood normal.        Behavior: Behavior normal.    Lab Results  Component Value Date   WBC 5.0 11/03/2021   HGB 14.1 11/03/2021   HCT 43.0 11/03/2021   PLT 242.0 11/03/2021   GLUCOSE 89 11/03/2021   CHOL 168 11/03/2021   TRIG 122.0  11/03/2021   HDL 50.90 11/03/2021   LDLDIRECT 132.4 07/29/2008   LDLCALC 93 11/03/2021   ALT 21 11/03/2021   AST 21 11/03/2021   NA 141 11/03/2021   K 4.4 11/03/2021   CL 106 11/03/2021   CREATININE 1.14 11/03/2021   BUN 10 11/03/2021   CO2 31 11/03/2021   TSH 1.08 11/03/2021   PSA 3.08 11/03/2021   HGBA1C 6.2 11/03/2021   MICROALBUR 1.2 03/22/2012    DG Cervical Spine 2 or 3 views  Result Date: 10/11/2019 CLINICAL DATA:  Cervical spine pain. EXAM:  CERVICAL SPINE - 2-3 VIEW COMPARISON:  MRI 12/30/2013. FINDINGS: Diffuse multilevel degenerative change. No acute bony abnormality identified. No evidence of fracture or dislocation. Pulmonary apices are clear. IMPRESSION: Diffuse multilevel degenerative change. No acute abnormality identified. Electronically Signed   By: Marcello Moores  Register   On: 10/11/2019 09:06    Assessment & Plan:   [name] was seen today for annual exam.  Diagnoses and all orders for this visit:  Prediabetes- His A1c is at 6.2%.  Medical therapy is not indicated. -     Basic metabolic panel; Future -     Hemoglobin A1c; Future -     Hemoglobin A1c -     Basic metabolic panel  Routine general medical examination at a health care facility- Exam completed, labs reviewed, vaccines reviewed - he refused a flu vaccine, cancer screenings are up-to-date, patient education was given. -     Lipid panel; Future -     PSA; Future -     PSA -     Lipid panel  Atherosclerosis of native coronary artery of native heart without angina pectoris- I do think the right-sided chest pain is cardiac in nature.  His EKG is normal.  Will continue to address risk factor modifications. -     aspirin EC 81 MG tablet; Take 1 tablet (81 mg total) by mouth daily. -     EKG 12-Lead  Hyperlipidemia with target LDL less than 70- LDL goal achieved. Doing well on the statin  -     Hepatic function panel; Future -     TSH; Future -     TSH -     Hepatic function panel  Benign prostatic  hyperplasia with nocturia -     alfuzosin (UROXATRAL) 10 MG 24 hr tablet; Take 1 tablet (10 mg total) by mouth daily with breakfast. -     Urinalysis, Routine w reflex microscopic; Future -     Urinalysis, Routine w reflex microscopic  Gastroesophageal reflux disease without esophagitis -     CBC with Differential/Platelet; Future -     esomeprazole (NEXIUM) 40 MG capsule; Take 1 capsule (40 mg total) by mouth daily. -     CBC with Differential/Platelet  Chronic idiopathic constipation -     lubiprostone (AMITIZA) 24 MCG capsule; Take 1 capsule (24 mcg total) by mouth 2 (two) times daily with a meal.  OBSTRUCTIVE SLEEP APNEA -     Ambulatory referral to Sleep Studies  Primary osteoarthritis of both knees  TOBACCO ABUSE -     Cancel: Ambulatory Referral for Lung Cancer Scre -     Ambulatory Referral for Lung Cancer Scre  Primary hypertension -     EKG 12-Lead  OAB (overactive bladder) -     mirabegron ER (MYRBETRIQ) 50 MG TB24 tablet; Take 1 tablet (50 mg total) by mouth daily.  I have discontinued Alvester Chou T. Wingerter's fluticasone, linaclotide, dutasteride, rosuvastatin, predniSONE, triamcinolone cream, influenza vac split quadrivalent PF, and Pfizer COVID-19 Vac Bivalent. I am also having him start on esomeprazole, lubiprostone, and mirabegron ER. Additionally, I am having him maintain his Multi-Vitamin, alfuzosin, and aspirin EC.  Meds ordered this encounter  Medications   alfuzosin (UROXATRAL) 10 MG 24 hr tablet    Sig: Take 1 tablet (10 mg total) by mouth daily with breakfast.    Dispense:  90 tablet    Refill:  0   esomeprazole (NEXIUM) 40 MG capsule    Sig: Take 1 capsule (40 mg total) by mouth  daily.    Dispense:  90 each    Refill:  1   lubiprostone (AMITIZA) 24 MCG capsule    Sig: Take 1 capsule (24 mcg total) by mouth 2 (two) times daily with a meal.    Dispense:  180 capsule    Refill:  1   aspirin EC 81 MG tablet    Sig: Take 1 tablet (81 mg total) by mouth daily.     Dispense:  90 tablet    Refill:  1   mirabegron ER (MYRBETRIQ) 50 MG TB24 tablet    Sig: Take 1 tablet (50 mg total) by mouth daily.    Dispense:  90 tablet    Refill:  1      Follow-up: Return in about 6 months (around 05/04/2022).  Scarlette Calico, MD

## 2021-11-09 ENCOUNTER — Telehealth: Payer: Self-pay | Admitting: Internal Medicine

## 2021-11-09 NOTE — Telephone Encounter (Signed)
Pt wanted an explanation on why Rx's were sent in on 12/7. He stated that he did not approve of any medications to be prescribed and was not informed that any medications were submitted that he agreed to. He stated that he spoke to [facility] and they will allow the pt return the unopened Nexium and receive a $60 credit. I reviewed the office note and told the pt that the Nexium wad rx'd for heartburn that was discussed. He stated that he mentioned that he had heartburn one time and that he did not need a medication for that. He asked if anything else was sent that would be a "surprise to him" and I stated that a medication for constipation (Amitiza) was also sent to [facility] and a medication for urinary issues (alfuzosin) was sent to his local pharmacy. Pt was "ok to take those" elaborating that urinary issues had been discussed during the visit. Pt repeatedly told me to "put yourself in my shoes if nothing was told to you" and added "so it's his word against mine" when I stated that I could only review the chart and what was typed in the note. Pt then went on to say "I know doctors and pharmacies work together and I do not want anymore bills and surprises" as he now referenced the sleep study referral as they called the pt yesterday to schedule. After being vocal about being displeased with his experience he thanked me for explaining things to him how it should have been explained during his appointment.

## 2021-11-09 NOTE — Telephone Encounter (Signed)
Patient calling in  Patient says he does not understand why he received medication in the mail for esomeprazole (NEXIUM) 40 MG capsule  Says him & the provider did not discuss that he would be getting this medication  Patient says it shows he owes [facility] money for the medication  Patient says he is not going to open the package & wants to send it back to [place] a call from nurse asap [phone]

## 2021-11-30 ENCOUNTER — Telehealth: Payer: Self-pay | Admitting: Internal Medicine

## 2021-11-30 ENCOUNTER — Other Ambulatory Visit: Payer: Self-pay | Admitting: Internal Medicine

## 2021-11-30 DIAGNOSIS — F17200 Nicotine dependence, unspecified, uncomplicated: Secondary | ICD-10-CM

## 2021-11-30 MED ORDER — NICOTINE 21 MG/24HR TD PT24: 21.0000 mg | MEDICATED_PATCH | Freq: Every day | TRANSDERMAL | 0 refills | Status: DC

## 2021-11-30 MED ORDER — NICOTINE 7 MG/24HR TD PT24: 7.0000 mg | MEDICATED_PATCH | Freq: Every day | TRANSDERMAL | 0 refills | Status: DC

## 2021-11-30 MED ORDER — NICOTINE 14 MG/24HR TD PT24
14.0000 mg | MEDICATED_PATCH | Freq: Every day | TRANSDERMAL | 0 refills | Status: DC
Start: 2021-11-30 — End: 2022-01-28

## 2021-11-30 NOTE — Telephone Encounter (Signed)
Patient calling in  Patient says he wants to finally stop smoking & would like for provider to send him in an rx to pharmacy for a nicotine patch   Pharmacy   [id number] - [place], [place].  Phone:  [phone] Fax:  [phone]

## 2022-01-03 ENCOUNTER — Other Ambulatory Visit: Payer: Self-pay | Admitting: Orthopedic Surgery

## 2022-01-24 ENCOUNTER — Ambulatory Visit (INDEPENDENT_AMBULATORY_CARE_PROVIDER_SITE_OTHER): Payer: 59 | Admitting: Neurology

## 2022-01-24 ENCOUNTER — Encounter: Payer: Self-pay | Admitting: Neurology

## 2022-01-24 VITALS — BP 108/76 | HR 73 | Ht 72.0 in | Wt 238.0 lb

## 2022-01-24 DIAGNOSIS — R519 Headache, unspecified: Secondary | ICD-10-CM

## 2022-01-24 DIAGNOSIS — E669 Obesity, unspecified: Secondary | ICD-10-CM

## 2022-01-24 DIAGNOSIS — R6889 Other general symptoms and signs: Secondary | ICD-10-CM

## 2022-01-24 DIAGNOSIS — G4752 REM sleep behavior disorder: Secondary | ICD-10-CM

## 2022-01-24 DIAGNOSIS — G4719 Other hypersomnia: Secondary | ICD-10-CM

## 2022-01-24 DIAGNOSIS — R351 Nocturia: Secondary | ICD-10-CM

## 2022-01-24 DIAGNOSIS — G4733 Obstructive sleep apnea (adult) (pediatric): Secondary | ICD-10-CM | POA: Diagnosis not present

## 2022-01-24 NOTE — Progress Notes (Signed)
Subjective:    [name] ID: Scott [name] is a 61 y.o. male.  HPI    Scott Age, MD, PhD [name] [address], [address] [address] [place], [place] [address]  Dear Dr. Ronnald [name],  I saw your [name], Scott [name], upon your kind request in my sleep clinic today for initial consultation of his sleep disorder, in particular, concern for underlying obstructive sleep apnea.  Scott [name] is unaccompanied today.  As you know, Scott [name] is a 61 year old right-handed gentleman with an underlying medical history of hypertension, hyperlipidemia, sickle cell anemia, chronic rhinitis, coronary artery disease, reflux disease, prediabetes, BPH, smoking, overactive bladder and obesity, who reports snoring and excessive daytime somnolence as well as vivid dreams, and acting out his dreams.  I reviewed your office note from 11/03/2021.  His Epworth sleepiness score is 15 out of 24, fatigue severity score is 47 out of 63.  He had a sleep study in Scott past.  I was able to review his baseline sleep study report from study date 01/07/2009, study was interpreted by Dr. Danton [name].  He was diagnosed with mild obstructive sleep apnea with an AHI of 6/h, O2 nadir of 87%.  He was found to have frequent PVCs [name].  Sleep efficiency was 79%, REM latency 95 minutes, sleep onset latency 6 minutes.  He did not have any significant PLM's.  He is single, he lives alone.  He smokes cigarettes very occasionally and smokes cigars as well.  He has no pets in Scott household, he does not watch TV in his bedroom.  He drinks caffeine [name] of coffee, 1 or 2 cups/day and maybe 2 cups of tea per day.  He has occasionally woken up with a headache.  He goes to bed around 2 AM or earlier than that, rise time is around 6 AM.  He reports that he is not able to sleep through Scott night, he works from 3 PM to midnight.  He is working on weight loss and since his sleep study he has lost weight, in Scott realm of 10 to 15  pounds.  He is not aware of any family history of sleep apnea.   His Past Medical History Is Significant For: Past Medical History:  Diagnosis Date   Cancer ([name])    Chronic rhinitis    Coronary atherosclerosis of unspecified type of vessel, native or graft    Dysmetabolic syndrome X    Erectile dysfunction    Hypertrophy of prostate with urinary obstruction and other lower urinary tract symptoms (LUTS)    Obstructive sleep apnea (adult) (pediatric)    no CPAP, has lost weight   Primary hypertension 11/03/2021   Problems related to high-risk sexual behavior    Pure hypercholesterolemia    Sickle cell anemia (HCC)    Tobacco use disorder     His Past Surgical History Is Significant For: Past Surgical History:  Procedure Laterality Date   COSMETIC SURGERY     HERNIA REPAIR     UHR  AT [facility]  <5 YRS AGO    INCISION AND DRAINAGE ABSCESS / HEMATOMA OF BURSA / KNEE / THIGH     I&D of complex abscess,left axilla. I&D of large infected pilonidal abscess   LAPAROSCOPIC PARTIAL COLECTOMY N/A 03/10/2015   Procedure: LAPAROSCOPIC ASSISTED RIGHT COLECTOMY;  Surgeon: Scott Bookbinder, MD;  Location: [facility];  Service: General;  Laterality: N/A;   MASS EXCISION N/A 10/19/2015   Procedure: EXCISION BACK MASS;  Surgeon: Scott Bookbinder, MD;  Location: [facility];  Service: General;  Laterality: N/A;   PTCA  2010    His Family History Is Significant For: Family History  Problem Relation [name] of Onset   Breast cancer Mother    Colon cancer Maternal Grandmother    Stroke Maternal Grandmother        Over 36   Colon cancer Maternal Grandfather    Prostate cancer Maternal Grandfather    Colon cancer Other        1st degree relative<60   Heart attack Father        Over 11   Esophageal cancer Neg Hx    Stomach cancer Neg Hx    Rectal cancer Neg Hx     His Social History Is Significant For: Social History   Socioeconomic History   Marital status: Divorced    Spouse  name: Not on file   Number of children: 1   Years of education: Not on file   Highest education level: Not on file  Occupational History   Not on file  Tobacco Use   Smoking status: Some Days    Types: Cigars   Smokeless tobacco: Never  Vaping Use   Vaping Use: Never used  Substance and Sexual Activity   Alcohol use: Yes    Comment: pt states rare   Drug use: No   Sexual activity: Yes    Birth control/protection: Condom  Other Topics Concern   Not on file  Social History Narrative   Caffeine: 1 2- cups,  Education: grad HS, 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 Divorced, one child,  3 grandkids.    Social Determinants of Health   Financial Resource Strain: Not on file  Food Insecurity: Not on file  Transportation Needs: Not on file  Physical Activity: Not on file  Stress: Not on file  Social Connections: Not on file    His Allergies Are:  Allergies  Allergen Reactions   Penicillins Hives and Itching  :   His Current Medications Are:  Outpatient Encounter Medications as of 01/24/2022  Medication Sig   aspirin EC 81 MG tablet Take 1 tablet (81 mg total) by mouth daily.   CELEBREX 100 MG capsule Take 100 mg by mouth 2 (two) times daily.   mirabegron ER (MYRBETRIQ) 50 MG TB24 tablet Take 1 tablet (50 mg total) by mouth daily.   Multiple Vitamin (MULTI-VITAMIN) tablet Take by mouth.   alfuzosin (UROXATRAL) 10 MG 24 hr tablet Take 1 tablet (10 mg total) by mouth daily with breakfast. ([name] not taking: Reported on 01/24/2022)   esomeprazole (NEXIUM) 40 MG capsule Take 1 capsule (40 mg total) by mouth daily. ([name] not taking: Reported on 01/24/2022)   lubiprostone (AMITIZA) 24 MCG capsule Take 1 capsule (24 mcg total) by mouth 2 (two) times daily with a meal. ([name] not taking: Reported on 01/24/2022)   nicotine (NICODERM CQ) 14 mg/24hr patch Place 1 patch (14 mg total) onto Scott skin daily. ([name] not taking: Reported on 01/24/2022)   nicotine (NICODERM  CQ) 21 mg/24hr patch Place 1 patch (21 mg total) onto Scott skin daily. ([name] not taking: Reported on 01/24/2022)   nicotine (NICODERM CQ) 7 mg/24hr patch Place 1 patch (7 mg total) onto Scott skin daily. ([name] not taking: Reported on 01/24/2022)   No facility-administered encounter medications on file as of 01/24/2022.  :   Review of Systems:  Out of a complete 14 point review of systems,  all are reviewed and negative with Scott exception of these symptoms as listed below:   Review of Systems  Neurological:        Pt having issues with sleep, Vivid dreams, notices when drinks alcohol will not dream. Has daytime sleepiness in afternoon.  ESS 16, fatigue 47. Could not remember where had previous sleep test.   Objective:  Neurological Exam  Physical Exam Physical Examination:   Vitals:   01/24/22 1026  BP: 108/76  Pulse: 73    General Examination: Scott [name] is a very pleasant 61 y.o. male in no acute distress. He appears well-developed and well-nourished and well groomed.   HEENT: Normocephalic, atraumatic, pupils are equal, round and reactive to light, extraocular tracking is good without limitation to gaze excursion or nystagmus noted. Hearing is grossly intact. Face is symmetric with normal facial animation. Speech is clear with no dysarthria noted. There is no hypophonia. There is no lip, neck/head, jaw or voice tremor. Neck is supple with full range of passive and active motion. There are no carotid bruits on auscultation. Oropharynx exam reveals: mild mouth dryness, adequate dental hygiene with partial dentures on Scott lower lip.  Edentulous on top, moderate airway crowding secondary to redundant soft palate and floor airway, tonsils about 1+, neck circumference of 16-1/2 inches.  Tongue protrudes centrally and palate elevates symmetrically.   Chest: Clear to auscultation without wheezing, rhonchi or crackles noted.  Heart: S1+S2+0, regular and normal without murmurs, rubs or  gallops noted.   Abdomen: Soft, non-tender and non-distended.  Extremities: There is no pitting edema in Scott distal lower extremities bilaterally.   Skin: Warm and dry without trophic changes noted.   Musculoskeletal: exam reveals no obvious joint deformities, tenderness or joint swelling or erythema.   Neurologically:  Mental status: Scott [name] is awake, alert and oriented in all 4 spheres. His immediate and remote memory, attention, language skills and fund of knowledge are appropriate. There is no evidence of aphasia, agnosia, apraxia or anomia. Speech is clear with normal prosody and enunciation. Thought process is linear. Mood is normal and affect is normal.  Cranial nerves II - XII are as described above under HEENT exam.  Motor exam: Normal bulk, strength and tone is noted. There is no tremor, Romberg is negative. Reflexes are 2+ throughout. Fine motor skills and coordination: grossly intact.  Cerebellar testing: No dysmetria or intention tremor. There is no truncal or gait ataxia.  Sensory exam: intact to light touch in Scott upper and lower extremities.  Gait, station and balance: He stands easily. No veering to one side is noted. No leaning to one side is noted. Posture is [name]-appropriate and stance is narrow based. Gait shows normal stride length and normal pace. No problems turning are noted.   Assessment and Plan:  In summary, Scott [name] is a very pleasant 61 y.o.-year old male with an underlying medical history of hypertension, hyperlipidemia, sickle cell anemia, chronic rhinitis, coronary artery disease, reflux disease, prediabetes, BPH, smoking, overactive bladder and obesity, whose history and physical exam concerning for obstructive sleep apnea (OSA). I had a long chat with Scott [name] about my findings and Scott diagnosis of OSA, its prognosis and treatment options. We talked about medical treatments, surgical interventions and non-pharmacological approaches. I explained in  particular Scott risks and ramifications of untreated moderate to severe OSA, especially with respect to developing cardiovascular disease down Scott Road, including congestive heart failure, difficult to treat hypertension, cardiac arrhythmias, or stroke. Even type 2 diabetes  has, in part, been linked to untreated OSA. Symptoms of untreated OSA include daytime sleepiness, memory problems, mood irritability and mood disorder such as depression and anxiety, lack of energy, as well as recurrent headaches, especially morning headaches. We talked about complete smoking cessation and trying to maintain a healthy lifestyle in general, as well as Scott importance of weight control. We also talked about Scott importance of good sleep hygiene. I recommended Scott following at this time: sleep study.  I outlined Scott differences between a laboratory attended sleep study versus home sleep test.  He was previously diagnosed with mild obstructive sleep apnea, he has not been on CPAP therapy but would be willing to consider it.  I explained Scott sleep test procedure to Scott [name] and also outlined possible surgical and non-surgical treatment options of OSA, including Scott use of a custom-made dental device (which would require a referral to a specialist dentist or oral surgeon), upper airway surgical options, such as traditional UPPP or a novel less invasive surgical option [name] of Inspire hypoglossal nerve stimulation (which would involve a referral to an ENT surgeon). I also explained Scott CPAP treatment option to Scott [name], who indicated that he would be willing to try CPAP if Scott need arises. I explained Scott importance of being compliant with PAP treatment, not only for insurance purposes but primarily to improve His symptoms, and for Scott [name]'s long term health benefit, including to reduce His cardiovascular risks. I answered all his questions today and Scott [name] was in agreement. I plan to see him back after Scott sleep  study is completed and encouraged him to call with any interim questions, concerns, problems or updates.   Thank you very much for allowing me to participate in Scott care of this nice [name]. If I can be of any further assistance to you please do not hesitate to call me at [phone].  Sincerely,   Scott Age, MD, PhD

## 2022-01-24 NOTE — Patient Instructions (Addendum)
Thank you for choosing [facility] for your sleep related care! It was nice to meet you today!   Here is what we discussed today:    Based on your symptoms and your exam I believe you are at risk for obstructive sleep apnea (aka OSA), and I think we should proceed with a sleep study to determine whether you do or do not have OSA and how severe it is. Even, if you have mild OSA, I may want you to consider treatment with CPAP, as treatment of even borderline or mild sleep apnea can result and improvement of symptoms such as sleep disruption, daytime sleepiness, nighttime bathroom breaks, restless leg symptoms, improvement of headache syndromes, even improved mood disorder.   As explained, an attended sleep study (meaning you get to stay overnight in the sleep lab), lets [place] monitor sleep-related behaviors such as sleep talking and leg movements in sleep, in addition to monitoring for sleep apnea.  A home sleep test is a screening tool for sleep apnea diagnosis only, but unfortunately, does not help with any other sleep-related diagnoses.  Please remember, the long-term risks and ramifications of untreated moderate to severe obstructive sleep apnea may include (but are not limited to): increased risk for cardiovascular disease, including congestive heart failure, stroke, difficult to control hypertension, treatment resistant obesity, arrhythmias, especially irregular heartbeat commonly known as A. Fib. (atrial fibrillation); even type 2 diabetes has been linked to untreated OSA.  Other correlations that untreated obstructive sleep apnea include macular edema which is swelling of the retina in the eyes, droopy eyelid syndrome, and elevated hemoglobin and hematocrit levels (often referred to as polycythemia).  Sleep apnea can cause disruption of sleep and sleep deprivation in most cases, which, in turn, can cause recurrent headaches, problems with memory, mood, concentration, focus, and  vigilance. Most people with untreated sleep apnea report excessive daytime sleepiness, which can affect their ability to drive. Please do not drive if you feel sleepy. Patients with sleep apnea can also develop difficulty initiating and maintaining sleep (aka insomnia).   Having sleep apnea may increase your risk for other sleep disorders, including involuntary behaviors sleep such as sleep terrors, sleep talking, sleepwalking.    Having sleep apnea can also increase your risk for restless leg syndrome and leg movements at night.   Please note that untreated obstructive sleep apnea may carry additional perioperative morbidity. Patients with significant obstructive sleep apnea (typically, in the moderate to severe degree) should receive, if possible, perioperative PAP (positive airway pressure) therapy and the surgeons and particularly the anesthesiologists should be informed of the diagnosis and the severity of the sleep disordered breathing.   I will likely see you back after your sleep study to go over the test results and where to go from there. We will call you after your sleep study to advise about the results and to set up an appointment at the time, as necessary.    Our sleep lab administrative assistant will call you to schedule your sleep study and give you further instructions, regarding the check in process for the sleep study, arrival time, what to bring, when you can expect to leave after the study, etc., and to answer any other logistical questions you may have. If you don't hear back from her by about 2 weeks from now, please feel free to call her direct line at [phone] or you can call our general clinic number, or email [place] through My Chart.

## 2022-01-28 ENCOUNTER — Encounter (HOSPITAL_BASED_OUTPATIENT_CLINIC_OR_DEPARTMENT_OTHER): Payer: Self-pay | Admitting: Orthopedic Surgery

## 2022-01-28 ENCOUNTER — Other Ambulatory Visit: Payer: Self-pay

## 2022-01-28 NOTE — Progress Notes (Signed)
Reviewed chart with Dr. Kalman Shan. No cardiac clearance needed per Dr. Kalman Shan. Okay to proceed with surgery at [facility]. ?

## 2022-02-07 NOTE — Anesthesia Preprocedure Evaluation (Signed)
Anesthesia Evaluation    Reviewed: Allergy & Precautions, Patient's Chart, lab work & pertinent test results  Airway        Dental   Pulmonary sleep apnea , Current Smoker,           Cardiovascular hypertension, + CAD       Neuro/Psych    GI/Hepatic GERD  ,  Endo/Other    Renal/GU      Musculoskeletal  (+) Arthritis ,   Abdominal   Peds  Hematology  (+) Blood dyscrasia, Sickle cell anemia ,   Anesthesia Other Findings All: PCn  Reproductive/Obstetrics                             Anesthesia Physical Anesthesia Plan  ASA: 3  Anesthesia Plan: General   Post-op Pain Management: Precedex, Tylenol PO (pre-op)* and Toradol IV (intra-op)*   Induction: Intravenous  PONV Risk Score and Plan: 2 and Treatment may vary due to age or medical condition and Midazolam  Airway Management Planned: LMA  Additional Equipment: None  Intra-op Plan:   Post-operative Plan:   Informed Consent:     Dental advisory given  Plan Discussed with:   Anesthesia Plan Comments:         Anesthesia Quick Evaluation

## 2022-02-07 NOTE — Progress Notes (Signed)

## 2022-02-08 ENCOUNTER — Other Ambulatory Visit: Payer: Self-pay

## 2022-02-08 ENCOUNTER — Ambulatory Visit (HOSPITAL_BASED_OUTPATIENT_CLINIC_OR_DEPARTMENT_OTHER): Payer: No Typology Code available for payment source | Admitting: Anesthesiology

## 2022-02-08 ENCOUNTER — Encounter (HOSPITAL_BASED_OUTPATIENT_CLINIC_OR_DEPARTMENT_OTHER): Payer: Self-pay | Admitting: Orthopedic Surgery

## 2022-02-08 ENCOUNTER — Ambulatory Visit (HOSPITAL_BASED_OUTPATIENT_CLINIC_OR_DEPARTMENT_OTHER)
Admission: RE | Admit: 2022-02-08 | Discharge: 2022-02-08 | Disposition: A | Discharge status: Home / Self Care | Payer: No Typology Code available for payment source | Attending: Orthopedic Surgery | Admitting: Orthopedic Surgery

## 2022-02-08 ENCOUNTER — Encounter (HOSPITAL_BASED_OUTPATIENT_CLINIC_OR_DEPARTMENT_OTHER)
Admission: RE | Disposition: A | Discharge status: Home / Self Care | Payer: Self-pay | Source: Home / Self Care | Attending: Orthopedic Surgery

## 2022-02-08 DIAGNOSIS — K219 Gastro-esophageal reflux disease without esophagitis: Secondary | ICD-10-CM | POA: Diagnosis not present

## 2022-02-08 DIAGNOSIS — I2510 Atherosclerotic heart disease of native coronary artery without angina pectoris: Secondary | ICD-10-CM | POA: Diagnosis not present

## 2022-02-08 DIAGNOSIS — F17290 Nicotine dependence, other tobacco product, uncomplicated: Secondary | ICD-10-CM | POA: Insufficient documentation

## 2022-02-08 DIAGNOSIS — X58XXXA Exposure to other specified factors, initial encounter: Secondary | ICD-10-CM | POA: Insufficient documentation

## 2022-02-08 DIAGNOSIS — S83281A Other tear of lateral meniscus, current injury, right knee, initial encounter: Secondary | ICD-10-CM | POA: Insufficient documentation

## 2022-02-08 DIAGNOSIS — M1711 Unilateral primary osteoarthritis, right knee: Secondary | ICD-10-CM | POA: Diagnosis not present

## 2022-02-08 DIAGNOSIS — D573 Sickle-cell trait: Secondary | ICD-10-CM | POA: Insufficient documentation

## 2022-02-08 DIAGNOSIS — G4733 Obstructive sleep apnea (adult) (pediatric): Secondary | ICD-10-CM | POA: Diagnosis not present

## 2022-02-08 DIAGNOSIS — S83241A Other tear of medial meniscus, current injury, right knee, initial encounter: Secondary | ICD-10-CM

## 2022-02-08 DIAGNOSIS — Z9889 Other specified postprocedural states: Secondary | ICD-10-CM

## 2022-02-08 DIAGNOSIS — I10 Essential (primary) hypertension: Secondary | ICD-10-CM | POA: Insufficient documentation

## 2022-02-08 DIAGNOSIS — S83231A Complex tear of medial meniscus, current injury, right knee, initial encounter: Secondary | ICD-10-CM | POA: Diagnosis not present

## 2022-02-08 DIAGNOSIS — Z791 Long term (current) use of non-steroidal anti-inflammatories (NSAID): Secondary | ICD-10-CM | POA: Insufficient documentation

## 2022-02-08 HISTORY — DX: Benign prostatic hyperplasia without lower urinary tract symptoms: N40.0

## 2022-02-08 HISTORY — DX: Prediabetes: R73.03

## 2022-02-08 HISTORY — DX: Sickle-cell trait: D57.3

## 2022-02-08 HISTORY — PX: KNEE ARTHROSCOPY WITH MENISCAL REPAIR: SHX5653

## 2022-02-08 SURGERY — ARTHROSCOPY, KNEE, WITH MENISCUS REPAIR
Anesthesia: General | Site: Knee | Laterality: Right

## 2022-02-08 MED ORDER — MIDAZOLAM HCL 2 MG/2ML IJ SOLN
INTRAMUSCULAR | Status: AC
  Filled 2022-02-08: qty 2

## 2022-02-08 MED ORDER — HYDROMORPHONE HCL 1 MG/ML IJ SOLN
INTRAMUSCULAR | Status: AC
  Filled 2022-02-08: qty 0.5

## 2022-02-08 MED ORDER — LIDOCAINE HCL (CARDIAC) PF 100 MG/5ML IV SOSY
PREFILLED_SYRINGE | INTRAVENOUS | Status: DC | PRN
Start: 2022-02-08 — End: 2022-02-08
  Administered 2022-02-08: 100 mg via INTRAVENOUS

## 2022-02-08 MED ORDER — PROPOFOL 10 MG/ML IV BOLUS
INTRAVENOUS | Status: AC
  Filled 2022-02-08: qty 20

## 2022-02-08 MED ORDER — LACTATED RINGERS IV SOLN: INTRAVENOUS | Status: DC | PRN

## 2022-02-08 MED ORDER — ONDANSETRON HCL 4 MG/2ML IJ SOLN: 4.0000 mg | Freq: Once | INTRAMUSCULAR | Status: DC | PRN

## 2022-02-08 MED ORDER — EPINEPHRINE PF 1 MG/ML IJ SOLN
INTRAMUSCULAR | Status: AC
  Filled 2022-02-08: qty 4

## 2022-02-08 MED ORDER — FENTANYL CITRATE (PF) 100 MCG/2ML IJ SOLN
INTRAMUSCULAR | Status: AC
  Filled 2022-02-08: qty 2

## 2022-02-08 MED ORDER — LACTATED RINGERS IV SOLN: INTRAVENOUS | Status: DC

## 2022-02-08 MED ORDER — LIDOCAINE 2% (20 MG/ML) 5 ML SYRINGE
INTRAMUSCULAR | Status: AC
  Filled 2022-02-08: qty 5

## 2022-02-08 MED ORDER — OXYCODONE HCL 5 MG PO TABS: 5.0000 mg | ORAL_TABLET | Freq: Four times a day (QID) | ORAL | 0 refills | Status: DC | PRN

## 2022-02-08 MED ORDER — HYDROMORPHONE HCL 1 MG/ML IJ SOLN
0.2500 mg | INTRAMUSCULAR | Status: DC | PRN
  Administered 2022-02-08 (×2): 0.5 mg via INTRAVENOUS

## 2022-02-08 MED ORDER — DEXAMETHASONE SODIUM PHOSPHATE 10 MG/ML IJ SOLN
INTRAMUSCULAR | Status: AC
  Filled 2022-02-08: qty 1

## 2022-02-08 MED ORDER — CEFAZOLIN SODIUM-DEXTROSE 2-3 GM-%(50ML) IV SOLR
INTRAVENOUS | Status: DC | PRN
  Administered 2022-02-08: 2 g via INTRAVENOUS

## 2022-02-08 MED ORDER — KETAMINE HCL 100 MG/ML IJ SOLN
INTRAMUSCULAR | Status: AC
  Filled 2022-02-08: qty 1

## 2022-02-08 MED ORDER — BUPIVACAINE HCL (PF) 0.25 % IJ SOLN
INTRAMUSCULAR | Status: AC
  Filled 2022-02-08: qty 30

## 2022-02-08 MED ORDER — DEXMEDETOMIDINE HCL IN NACL 400 MCG/100ML IV SOLN
INTRAVENOUS | Status: DC | PRN
  Administered 2022-02-08: 20 ug via INTRAVENOUS

## 2022-02-08 MED ORDER — ONDANSETRON HCL 4 MG/2ML IJ SOLN
INTRAMUSCULAR | Status: AC
  Filled 2022-02-08: qty 2

## 2022-02-08 MED ORDER — GLYCOPYRROLATE 0.2 MG/ML IJ SOLN
INTRAMUSCULAR | Status: DC | PRN
  Administered 2022-02-08: .1 mg via INTRAVENOUS

## 2022-02-08 MED ORDER — OXYCODONE HCL 5 MG PO TABS
ORAL_TABLET | ORAL | Status: AC
  Filled 2022-02-08: qty 1

## 2022-02-08 MED ORDER — ACETAMINOPHEN 500 MG PO TABS
ORAL_TABLET | ORAL | Status: AC
  Filled 2022-02-08: qty 2

## 2022-02-08 MED ORDER — ROPIVACAINE HCL 5 MG/ML IJ SOLN
INTRAMUSCULAR | Status: DC | PRN
  Administered 2022-02-08: 30 mL

## 2022-02-08 MED ORDER — MIDAZOLAM HCL 2 MG/2ML IJ SOLN
INTRAMUSCULAR | Status: DC | PRN
Start: 2022-02-08 — End: 2022-02-08
  Administered 2022-02-08: 2 mg via INTRAVENOUS

## 2022-02-08 MED ORDER — OXYCODONE HCL 5 MG/5ML PO SOLN: 5.0000 mg | Freq: Once | ORAL | Status: AC | PRN

## 2022-02-08 MED ORDER — KETAMINE HCL 100 MG/ML IJ SOLN
INTRAMUSCULAR | Status: DC | PRN
  Administered 2022-02-08 (×2): 25 mg via INTRAMUSCULAR

## 2022-02-08 MED ORDER — FENTANYL CITRATE (PF) 100 MCG/2ML IJ SOLN
INTRAMUSCULAR | Status: AC
Start: 2022-02-08 — End: ?
  Filled 2022-02-08: qty 2

## 2022-02-08 MED ORDER — PROPOFOL 10 MG/ML IV BOLUS
INTRAVENOUS | Status: DC | PRN
  Administered 2022-02-08: 150 mg via INTRAVENOUS

## 2022-02-08 MED ORDER — SODIUM CHLORIDE 0.9 % IR SOLN
Status: DC | PRN
  Administered 2022-02-08: 1800 mL

## 2022-02-08 MED ORDER — FENTANYL CITRATE (PF) 100 MCG/2ML IJ SOLN
INTRAMUSCULAR | Status: DC | PRN
Start: 2022-02-08 — End: 2022-02-08
  Administered 2022-02-08 (×2): 50 ug via INTRAVENOUS
  Administered 2022-02-08: 100 ug via INTRAVENOUS
  Administered 2022-02-08: 25 ug via INTRAVENOUS
  Administered 2022-02-08: 50 ug via INTRAVENOUS
  Administered 2022-02-08: 25 ug via INTRAVENOUS

## 2022-02-08 MED ORDER — ACETAMINOPHEN 10 MG/ML IV SOLN: 1000.0000 mg | Freq: Once | INTRAVENOUS | Status: DC | PRN

## 2022-02-08 MED ORDER — ACETAMINOPHEN 500 MG PO TABS
1000.0000 mg | ORAL_TABLET | Freq: Once | ORAL | Status: AC
  Administered 2022-02-08: 1000 mg via ORAL

## 2022-02-08 MED ORDER — VANCOMYCIN HCL IN DEXTROSE 1-5 GM/200ML-% IV SOLN
1000.0000 mg | INTRAVENOUS | Status: AC
  Administered 2022-02-08: 300 mg via INTRAVENOUS
  Administered 2022-02-08: 700 mg via INTRAVENOUS

## 2022-02-08 MED ORDER — EPHEDRINE SULFATE (PRESSORS) 50 MG/ML IJ SOLN
INTRAMUSCULAR | Status: DC | PRN
Start: 2022-02-08 — End: 2022-02-08
  Administered 2022-02-08: 10 mg via INTRAVENOUS

## 2022-02-08 MED ORDER — VANCOMYCIN HCL IN DEXTROSE 1-5 GM/200ML-% IV SOLN
INTRAVENOUS | Status: AC
  Filled 2022-02-08: qty 200

## 2022-02-08 MED ORDER — PROMETHAZINE HCL 12.5 MG PO TABS: 12.5000 mg | ORAL_TABLET | Freq: Four times a day (QID) | ORAL | 0 refills | Status: DC | PRN

## 2022-02-08 MED ORDER — OXYCODONE HCL 5 MG PO TABS
5.0000 mg | ORAL_TABLET | Freq: Once | ORAL | Status: AC | PRN
  Administered 2022-02-08: 5 mg via ORAL

## 2022-02-08 MED ORDER — PHENYLEPHRINE HCL (PRESSORS) 10 MG/ML IV SOLN
INTRAVENOUS | Status: DC | PRN
  Administered 2022-02-08 (×3): 120 ug via INTRAVENOUS

## 2022-02-08 MED ORDER — DEXAMETHASONE SODIUM PHOSPHATE 10 MG/ML IJ SOLN
INTRAMUSCULAR | Status: DC | PRN
  Administered 2022-02-08: 10 mg via INTRAVENOUS

## 2022-02-08 SURGICAL SUPPLY — 38 items
APL PRP STRL LF DISP 70% ISPRP (MISCELLANEOUS) ×3
BLADE CLIPPER SURG (BLADE) ×1 IMPLANT
BLADE SURG 15 STRL LF DISP TIS (BLADE) IMPLANT
BLADE SURG 15 STRL SS (BLADE) ×4
BNDG COHESIVE 4X5 TAN ST LF (GAUZE/BANDAGES/DRESSINGS) ×3 IMPLANT
BNDG ELASTIC 6X5.8 VLCR STR LF (GAUZE/BANDAGES/DRESSINGS) ×4 IMPLANT
CHLORAPREP W/TINT 26 (MISCELLANEOUS) ×5 IMPLANT
CLSR STERI-STRIP ANTIMIC 1/2X4 (GAUZE/BANDAGES/DRESSINGS) IMPLANT
DISSECTOR 3.5MM X 13CM CVD (MISCELLANEOUS) ×4 IMPLANT
DRAPE EXTREMITY T 121X128X90 (DISPOSABLE) ×4 IMPLANT
DRAPE IMP U-DRAPE 54X76 (DRAPES) ×1 IMPLANT
DRAPE INCISE IOBAN 66X45 STRL (DRAPES) ×3 IMPLANT
DRAPE TOP ARMCOVERS (MISCELLANEOUS) ×1 IMPLANT
DRAPE U-SHAPE 47X51 STRL (DRAPES) ×3 IMPLANT
DW OUTFLOW CASSETTE/TUBE SET (MISCELLANEOUS) ×1 IMPLANT
FIBERSTICK 2 (SUTURE) IMPLANT
GAUZE SPONGE 4X4 12PLY STRL (GAUZE/BANDAGES/DRESSINGS) ×1 IMPLANT
GLOVE SRG 8 PF TXTR STRL LF DI (GLOVE) ×2 IMPLANT
GLOVE SURG LTX SZ8 (GLOVE) ×6 IMPLANT
GLOVE SURG ORTHO LTX SZ7.5 (GLOVE) ×4 IMPLANT
GLOVE SURG UNDER POLY LF SZ8 (GLOVE) ×4
GOWN STRL REUS W/ TWL LRG LVL3 (GOWN DISPOSABLE) ×2 IMPLANT
GOWN STRL REUS W/TWL LRG LVL3 (GOWN DISPOSABLE) ×4
GOWN STRL REUS W/TWL XL LVL3 (GOWN DISPOSABLE) ×4 IMPLANT
KIT TURNOVER KIT B (KITS) ×3 IMPLANT
MANIFOLD NEPTUNE II (INSTRUMENTS) ×1 IMPLANT
NDL SAFETY ECLIPSE 18X1.5 (NEEDLE) ×2 IMPLANT
NEEDLE HYPO 18GX1.5 SHARP (NEEDLE) ×4
PACK ARTHROSCOPY DSU (CUSTOM PROCEDURE TRAY) ×4 IMPLANT
PADDING CAST COTTON 6X4 STRL (CAST SUPPLIES) ×2 IMPLANT
PORT APPOLLO RF 90DEGREE MULTI (SURGICAL WAND) IMPLANT
SHEET MEDIUM DRAPE 40X70 STRL (DRAPES) ×4 IMPLANT
SLEEVE SCD COMPRESS KNEE MED (STOCKING) ×3 IMPLANT
STRIP CLOSURE SKIN 1/2X4 (GAUZE/BANDAGES/DRESSINGS) ×4 IMPLANT
SYR 5ML LL (SYRINGE) ×3 IMPLANT
TOWEL GREEN STERILE FF (TOWEL DISPOSABLE) ×8 IMPLANT
TUBE CONNECTING 20X1/4 (TUBING) ×3 IMPLANT
TUBING ARTHROSCOPY IRRIG 16FT (MISCELLANEOUS) ×4 IMPLANT

## 2022-02-08 NOTE — Discharge Instructions (Addendum)
Discharge instructions for Dr. Georgeanna Harrison, M.D.: Please refer to the two-sided discharge instructions paper that Dr. Mable Fill placed in the patient's paper chart. Please give this to the patient to take home after reviewing with the patient!!  ? ?General discharge instructions: ? ?PLEASE REFER TO TWO-SIDED PAPER INSTRUCTIONS IN PAPER CHART FOR SPECIFIC INSTRUCTIONS!!! ? ?Diet: As you were doing prior to hospitalization. ?Shower:  Unless otherwise specified (i.e. on two-sided paper instructions with paper chart) may shower but keep the wounds dry, use an occlusive plastic wrap, NO SOAKING IN TUB.  If the bandage gets wet, change with a clean dry gauze. ?Dressing:  Unless otherwise specified (i.e. on two-sided paper instructions with paper chart), may change your dressing 3-5 days after surgery.  Then change the dressing daily with sterile gauze dressing.  If there are sticky tapes (steri-strips) on your wounds and all the stitches are absorbable.  Leave the steri-strips in place when changing your dressings, they will peel off with time, usually 2-3 weeks. ?Activity:  Increase activity slowly as tolerated, but follow the restrictions on the two-sided paper discharge instructions sheet that Dr. Mable Fill placed in the paper chart.  No lifting or driving for 6 weeks. ?Weight Bearing: WEIGHTBEARING AS TOLERATED (WBAT) on the RIGHT LOWER EXTREMITY. ?To prevent constipation: You may use over-the-counter stool softener(s) such as Colace (over the counter) 100 mg by mouth twice a day and/or Miralax (over the counter) for constipation as needed.  Drink plenty of fluids (prune juice may be helpful) and high fiber foods.  ?Itching:  If you experience itching with your medications, try taking only a single pain pill, or even half a pain pill at a time.  You can also use benadryl over the counter for itching or also to help with sleep.  ?Precautions:  If you experience chest pain or shortness of breath - call 911 immediately for  transfer to the hospital emergency department!! ? ?PLEASE REFER TO TWO-SIDED PAPER INSTRUCTIONS IN PAPER CHART FOR SPECIFIC INSTRUCTIONS!!! ? ?If you develop a fever greater that 101.1 deg F, purulent drainage from wound, increased redness or drainage from wound, or calf pain -- Call the office at [phone].  ? ? ? ? ? ?Post Anesthesia Home Care Instructions ? ?Activity: ?Get plenty of rest for the remainder of the day. A responsible individual must stay with you for 24 hours following the procedure.  ?For the next 24 hours, DO NOT: ?-Drive a car ?-Paediatric nurse ?-Drink alcoholic beverages ?-Take any medication unless instructed by your physician ?-Make any legal decisions or sign important papers. ? ?Meals: ?Start with liquid foods such as gelatin or soup. Progress to regular foods as tolerated. Avoid greasy, spicy, heavy foods. If nausea and/or vomiting occur, drink only clear liquids until the nausea and/or vomiting subsides. Call your physician if vomiting continues. ? ?Special Instructions/Symptoms: ?Your throat may feel dry or sore from the anesthesia or the breathing tube placed in your throat during surgery. If this causes discomfort, gargle with warm salt water. The discomfort should disappear within 24 hours. ? ?If you had a scopolamine patch placed behind your ear for the management of post- operative nausea and/or vomiting: ? ?1. The medication in the patch is effective for 72 hours, after which it should be removed.  Wrap patch in a tissue and discard in the trash. Wash hands thoroughly with soap and water. ?2. You may remove the patch earlier than 72 hours if you experience unpleasant side effects which may include dry mouth, dizziness  or visual disturbances. ?3. Avoid touching the patch. Wash your hands with soap and water after contact with the patch. ?    ? ? ?Call your surgeon if you experience:  ? ?1.  Fever over 101.0. ?2.  Inability to urinate. ?3.  Nausea and/or vomiting. ?4.  Extreme  swelling or bruising at the surgical site. ?5.  Continued bleeding from the incision. ?6.  Increased pain, redness or drainage from the incision. ?7.  Problems related to your pain medication. ?8.  Any problems and/or concerns  ?

## 2022-02-08 NOTE — Transfer of Care (Signed)
Immediate Anesthesia Transfer of Care Note ? ?Patient: [name] ? ?Procedure(s) Performed: RIGHT KNEE PARTIAL MEDIAL MENISCECTOMY (Right: Knee) ? ?Patient Location: PACU ? ?Anesthesia Type:General ? ?Level of Consciousness: awake, drowsy and patient cooperative ? ?Airway & Oxygen Therapy: Patient Spontanous Breathing and Patient connected to face mask oxygen ? ?Post-op Assessment: Report given to RN and Post -op Vital signs reviewed and stable ? ?Post vital signs: Reviewed and stable ? ?Last Vitals:  ?Vitals Value Taken Time  ?BP 135/93 02/08/22 0920  ?Temp    ?Pulse 91 02/08/22 0921  ?Resp 13 02/08/22 0921  ?SpO2 97 % 02/08/22 0921  ?Vitals shown include unvalidated device data. ? ?Last Pain:  ?Vitals:  ? 02/08/22 0643  ?TempSrc: Oral  ?PainSc: 6   ?   ? ?Patients Stated Pain Goal: 5 (02/08/22 5993) ? ?Complications: No notable events documented. ?

## 2022-02-08 NOTE — Anesthesia Procedure Notes (Signed)
Procedure Name: LMA Insertion ?Date/Time: 02/08/2022 8:17 AM ?Performed by: Verita Lamb, CRNA ?Pre-anesthesia Checklist: Patient identified, Emergency Drugs available, Suction available and Patient being monitored ?Patient Re-evaluated:Patient Re-evaluated prior to induction ?Oxygen Delivery Method: Circle system utilized ?Preoxygenation: Pre-oxygenation with 100% oxygen ?Induction Type: IV induction ?Ventilation: Mask ventilation without difficulty ?LMA: LMA inserted ?LMA Size: 4.0 ?Number of attempts: 1 ?Airway Equipment and Method: Bite block ?Placement Confirmation: positive ETCO2, CO2 detector and breath sounds checked- equal and bilateral ?Tube secured with: Tape ?Dental Injury: Teeth and Oropharynx as per pre-operative assessment  ? ? ? ? ?

## 2022-02-08 NOTE — Op Note (Signed)
OPERATIVE NOTE ? ?[name] ?male ?61 y.o. ?02/08/2022 ? ?PREOPERATIVE DIAGNOSIS: ?Right knee medial meniscus tear ?Right knee osteoarthritis ? ?POSTOPERATIVE DIAGNOSIS: ?Right knee medial meniscus tear ?Right knee lateral meniscus tear ?Right knee osteoarthritis ? ?PROCEDURE(S): ?Right knee arthroscopy with partial medial and lateral meniscectomies (35009) ? ?SURGEON: Georgeanna Harrison, M.D. ? ?ASSISTANT(S): None ? ?ANESTHESIA: General ? ?FINDINGS: ?Preoperative Examination: ?RLE: Effusion.  Popliteal cyst.  Medial joint line tenderness.  Mild tenderness over pes anserinus.  Range of motion 0 degrees to 130 degrees.  Positive medial McMurray.  Positive Steinmann, Apley, and Thessaly tests for reproduction of medial pain and symptoms.  Normal dorsiflexion, plantarflexion, and EHL function.  Sensation intact light touch in the superficial peroneal, deep peroneal, and tibial distributions.  Normal DP and PT pulses.  Warm and well-perfused. ? ?Operative Findings: ?Arthroscopic examination of the knee revealed the following: ?Medial compartment: Grade 2 and 3 changes of the medial femoral condyle and medial tibial plateau cartilage.  Complex tear of the posterior horn and body of the medial meniscus, with fragment flipping into the meniscotibial recess, and multiple fragments flipping into and out of the weightbearing articular area. ?Intercondylar notch: ACL intact.  PCL intact. ?Lateral compartment: Softening of the lateral tibial plateau cartilage.  Femoral condyle cartilage intact.  Complex degenerative tear of the white white zone of the body of the lateral meniscus. ?Lateral gutter: Vitas.  No loose bodies. ?Patellofemoral compartment: Grade 2 and 3 changes present on the patella and trochlear cartilage. ?Suprapatellar pouch: Synovitis.  No loose bodies. ?Medial gutter: Synovitis.  No loose bodies. ? ?IMPLANTS: ?* No implants in log * ? ?INDICATIONS:  ?The patient is a 61 y.o. male who sustained a work-related  injury to his right knee.  Advanced imaging was obtained which demonstrated complex tear of the medial meniscus, with concern for a meniscotibial recess, sign fragment.  He attempted conservative treatment, but persisted with mechanical symptoms, and pain and symptoms localizing to the area of the, signed on the MRI.  He understood that he does also have some degenerative changes in his knee, including in the medial compartment, which could be contributing to some of his pain.  He understood the risks, benefits and alternatives to surgery which include but are not limited to bleeding, wound healing complications, infection, damage to surrounding structures, persistent pain, stiffness, lack of improvement, potential for subsequent arthritis or worsening of pre-existing arthritis, and need for further surgery, as well as complications related to anesthesia, cardiovascular complications, and death.  He also understood the potential for continued pain in that there were no guarantees of acceptable outcome.  After weighing these risks the patient opted to proceed with surgery. ? ?TECHNIQUE: ?Patient was identified in the preoperative holding area.  The right knee was marked by myself.  Consent was signed by myself and the patient.  No block was performed by anesthesia in the preoperative holding area.  Patient was taken to the operative suite and placed supine on the operative table.  Anesthesia was induced by the anesthesia team.  The patient was positioned appropriately for the procedure and all bony prominences were well padded.  A non-sterile thigh tourniquet was placed on the operative extremity.  Preoperative antibiotics were given. The extremity was prepped and draped in the usual sterile fashion and surgical timeout was performed.  Anatomic landmarks were marked out on the knee.  Standard lateral infrapatellar portal was established.  Medial infrapatellar portal established using spinal needle localization under  arthroscopic visualization.  Diagnostic arthroscopy  was performed, with findings as noted.  The complex tear of the posterior horn and body of the medial meniscus was debrided back to a stable edge of healthy tissue using combination of the arthroscopic biter and sucker shaver device.  The meniscotibial recess fragment was probed up to the joint, and was likewise debrided back to stable healthy tissue.  Due to poor visualization, the limb was exsanguinated with gravity and tourniquet was inflated to 300 mmHg.  Evaluation of the lateral compartment demonstrated complex tear of the white white zone of the body of the lateral meniscus.  This was debrided back to stable healthy tissue with a sucker shaver device.  Areas of loose cartilage flaps on the femoral condyle and in the patellofemoral joint were very gently debrided with a sucker shaver back to stable edges.  The knee was copiously irrigated and suctioned of all fluid and debris.  Portals were closed with simple inverted 2-0 Monocryl stitches, and the knee was injected with 20 cc ropivacaine.  Portal sites were injected with an additional 10 cc of ropivacaine.  Portals were dressed with Steri-Strips, and dry sterile dressing with Ace wrap was placed on the knee.  Ice pack was placed over the dressing.  Patient was awakened from anesthesia and transferred to PACU in stable condition.  Tolerated procedure well.  There were no complications. ? ?POST OPERATIVE INSTRUCTIONS: ?Mobility: As tolerated, no restrictions.  Use crutches for ambulation as needed, discontinue as tolerated. ?Pain control: Continue to wean/titrate to appropriate oral regimen ?DVT Prophylaxis: 81 mg aspirin twice a day for 2 weeks, according to protocol ?RLE: Weightbearing as tolerated, no restrictions ?Disposition: Home ?Dressing care:  Unwrap Ace wrap and remove gauze dressings after 48 hours from surgery.  Leave Steri-Strips in place.  May cover these with fresh Band-Aids if desired.  Reapply  Ace wrap. ?Follow-up: Please call [facility] [phone]) to schedule follow-op appointment for 2 weeks after surgery. ? ?TOURNIQUET TIME: 38 minutes ? ?BLOOD LOSS: 15 mL ?        ?DRAINS: none ?        ?SPECIMEN: none ?      ?COMPLICATIONS:  * No complications entered in OR log * ?        ?DISPOSITION: PACU - hemodynamically stable. ?        ?CONDITION: stable  ? ?Georgeanna Harrison M.D. ?Orthopaedic Surgery ?[facility] ? ? ?Portions of the record have been created with voice recognition software.  Grammatical and punctuation errors, random word insertions, wrong-word or "sound-a-like" substitutions, pronoun errors (inaccuracies and/or substitutions), and/or incomplete sentences may have occurred due to the inherent limitations of voice recognition software.  Not all errors are caught or corrected.  Although every attempt is made to root out erroneous and incomplete transcription, the note may still not fully represent the intent or opinion of the author.  Read the chart carefully and recognize, using context, where errors/substitutions have occurred.  Any questions or concerns about the content of this note or information contained within the body of this dictation should be addressed directly with the author for clarification.  ?

## 2022-02-08 NOTE — H&P (Signed)
PREOPERATIVE H&P ? ?HPI: ?[name] is a 61 y.o. male who has presented today for surgery, with the diagnosis of right knee medial meniscus tear.  The various methods of treatment have been discussed with the patient and family.  After consideration of risks, benefits, and other options for treatment, the patient has consented to [facility] as a surgical intervention.  The patient's history has been reviewed, patient examined, no change in status, stable for surgery.  I have reviewed the patient's chart and labs.  Questions were answered to the patient's satisfaction.   ? ?PMH: ?Past Medical History:  ?Diagnosis Date  ? BPH (benign prostatic hyperplasia)   ? Chronic rhinitis   ? Coronary atherosclerosis of unspecified type of vessel, native or graft   ? Dysmetabolic syndrome X   ? Erectile dysfunction   ? Hypertrophy of prostate with urinary obstruction and other lower urinary tract symptoms (LUTS)   ? Obstructive sleep apnea (adult) (pediatric)   ? no CPAP, has lost weight  ? Prediabetes   ? Primary hypertension 11/03/2021  ? Problems related to high-risk sexual behavior   ? Pure hypercholesterolemia   ? Sickle cell trait ([place])   ? Tobacco use disorder   ? ? ?Home Medications Allergies  ?No current facility-administered medications on file prior to encounter.  ? ?Current Outpatient Medications on File Prior to Encounter  ?Medication Sig Dispense Refill  ? aspirin EC 81 MG tablet Take 1 tablet (81 mg total) by mouth daily. 90 tablet 1  ? meloxicam (MOBIC) 15 MG tablet Take 15 mg by mouth daily.    ? Multiple Vitamin (MULTI-VITAMIN) tablet Take by mouth.    ? tamsulosin (FLOMAX) 0.4 MG CAPS capsule Take 0.4 mg by mouth.    ? Allergies  ?Allergen Reactions  ? Penicillins Hives and Itching  ?  ? ?PSH: ?Past Surgical History:  ?Procedure Laterality Date  ? COSMETIC SURGERY    ? HERNIA REPAIR    ? UHR  AT [facility]  <5 YRS AGO   ? INCISION AND DRAINAGE ABSCESS / HEMATOMA OF BURSA / KNEE /  THIGH    ? I&D of complex abscess,left axilla. I&D of large infected pilonidal abscess  ? LAPAROSCOPIC PARTIAL COLECTOMY N/A 03/10/2015  ? Procedure: LAPAROSCOPIC ASSISTED RIGHT COLECTOMY;  Surgeon: Rolm Bookbinder, MD;  Location: [facility];  Service: General;  Laterality: N/A;  ? MASS EXCISION N/A 10/19/2015  ? Procedure: EXCISION BACK MASS;  Surgeon: Rolm Bookbinder, MD;  Location: [facility];  Service: General;  Laterality: N/A;  ? PTCA  2010  ?  ? ?Family History Social History  ?Family History  ?Problem Relation Age of Onset  ? Breast cancer Mother   ? Colon cancer Maternal Grandmother   ? Stroke Maternal Grandmother   ?     Over 34  ? Colon cancer Maternal Grandfather   ? Prostate cancer Maternal Grandfather   ? Colon cancer Other   ?     1st degree relative<60  ? Heart attack Father   ?     Over 82  ? Esophageal cancer Neg Hx   ? Stomach cancer Neg Hx   ? Rectal cancer Neg Hx   ?  Social History  ? ?Socioeconomic History  ? Marital status: Divorced  ?  Spouse name: Not on file  ? Number of children: 1  ? Years of education: Not on file  ? Highest education level: Not on file  ?Occupational History  ?  Not on file  ?Tobacco Use  ? Smoking status: Some Days  ?  Types: Cigars  ? Smokeless tobacco: Never  ?Vaping Use  ? Vaping Use: Never used  ?Substance and Sexual Activity  ? Alcohol use: Yes  ?  Comment: pt states rare  ? Drug use: No  ? Sexual activity: Yes  ?  Birth control/protection: Condom  ?Other Topics Concern  ? Not on file  ?Social History Narrative  ? 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 Divorced, one child,  3 grandkids.   ? ?Social Determinants of Health  ? ?Financial Resource Strain: Not on file  ?Food Insecurity: Not on file  ?Transportation Needs: Not on file  ?Physical Activity: Not on file  ?Stress: Not on file  ?Social Connections: Not on file  ?  ? ?Review of Systems: ?MSK: As noted per HPI above ?GI: No current  Nausea/vomiting ?ENT: Denies sore throat, epistaxis ?CV: Denies chest pain ?Resp: No current shortness of breath ? ?Other than mentioned above, there are no Constitutional, Neurological, Psychiatric, ENT, Ophthalmological, Cardiovascular, Respiratory, GI, GU, Musculoskeletal, Integumentary, Lymphatic, Endocrine or Allergic issues.  ? ?Physical Examination: ?CV: Normal distal pulses ?Lungs: Unlabored respirations ?RLE: Effusion.  Popliteal cyst.  Medial joint line tenderness.  Mild tenderness over pes anserinus.  Range of motion 0 degrees to 130 degrees.  Positive medial McMurray.  Positive Steinmann, Apley, and Thessaly tests for reproduction of medial pain and symptoms.  Normal dorsiflexion, plantarflexion, and EHL function.  Sensation intact light touch in the superficial peroneal, deep peroneal, and tibial distributions.  Normal DP and PT pulses.  Warm and well-perfused. ? ?Assessment/Plan: ?RIGHT KNEE PARTIAL MEDIAL MENISCECTOMY  ? ? ?Georgeanna Harrison M.D. ?Orthopaedic Surgery ?[facility] ? ?Review of this patient's medications prescribed by other providers does not in any way constitute an endorsement by this clinician of their use, indications, dosage, route, efficacy, interactions, or other clinical parameters. ? ?Portions of the record have been created with voice recognition software.  Grammatical and punctuation errors, random word insertions, wrong-word or "sound-a-like" substitutions, pronoun errors (inaccuracies and/or substitutions), and/or incomplete sentences may have occurred due to the inherent limitations of voice recognition software.  Not all errors are caught or corrected.  Although every attempt is made to root out erroneous and incomplete transcription, the note may still not fully represent the intent or opinion of the author.  Read the chart carefully and recognize, using context, where errors/substitutions have occurred.  Any questions or concerns about the  content of this note or information contained within the body of this dictation should be addressed directly with the author for clarification.  ? ?

## 2022-02-08 NOTE — Anesthesia Postprocedure Evaluation (Signed)
Anesthesia Post Note ? ?Patient: [name] ? ?Procedure(s) Performed: RIGHT KNEE PARTIAL MEDIAL MENISCECTOMY (Right: Knee) ? ?  ? ?Patient location during evaluation: PACU ?Anesthesia Type: General ?Level of consciousness: awake and alert ?Pain management: pain level controlled ?Vital Signs Assessment: post-procedure vital signs reviewed and stable ?Respiratory status: spontaneous breathing, nonlabored ventilation, respiratory function stable and patient connected to nasal cannula oxygen ?Cardiovascular status: blood pressure returned to baseline and stable ?Postop Assessment: no apparent nausea or vomiting ?Anesthetic complications: no ? ? ?No notable events documented. ? ?Last Vitals:  ?Vitals:  ? 02/08/22 1018 02/08/22 1038  ?BP:  (!) 138/91  ?Pulse: 73 61  ?Resp: 15 16  ?Temp:  (!) 36.4 ?C  ?SpO2: 97% 96%  ?  ?Last Pain:  ?Vitals:  ? 02/08/22 1035  ?TempSrc:   ?PainSc: 5   ? ? ?  ?  ?  ?  ?  ?  ? ?[place] [place] ? ? ? ? ?

## 2022-02-09 ENCOUNTER — Encounter (HOSPITAL_BASED_OUTPATIENT_CLINIC_OR_DEPARTMENT_OTHER): Payer: Self-pay | Admitting: Orthopedic Surgery

## 2022-02-23 ENCOUNTER — Ambulatory Visit (INDEPENDENT_AMBULATORY_CARE_PROVIDER_SITE_OTHER): Payer: 59 | Admitting: Neurology

## 2022-02-23 DIAGNOSIS — E669 Obesity, unspecified: Secondary | ICD-10-CM

## 2022-02-23 DIAGNOSIS — R6889 Other general symptoms and signs: Secondary | ICD-10-CM

## 2022-02-23 DIAGNOSIS — R519 Headache, unspecified: Secondary | ICD-10-CM

## 2022-02-23 DIAGNOSIS — G4733 Obstructive sleep apnea (adult) (pediatric): Secondary | ICD-10-CM

## 2022-02-23 DIAGNOSIS — G4719 Other hypersomnia: Secondary | ICD-10-CM

## 2022-02-23 DIAGNOSIS — R351 Nocturia: Secondary | ICD-10-CM

## 2022-02-23 DIAGNOSIS — G4752 REM sleep behavior disorder: Secondary | ICD-10-CM

## 2022-02-28 NOTE — Progress Notes (Signed)
See procedure note.

## 2022-03-01 NOTE — Addendum Note (Signed)
Addended by: Star Age on: 03/01/2022 12:32 PM ? ? Modules accepted: Orders ? ?

## 2022-03-01 NOTE — Procedures (Signed)
? ?  [facility] ? ?HOME SLEEP TEST (Watch PAT) REPORT ? ?STUDY DATE: 02/24/22 ? ?DOB: November 08, 1961 ? ?MRN: [id number] ? ?ORDERING CLINICIAN: Star Age, MD, PhD ?  ?REFERRING CLINICIAN: Janith Lima, MD  ? ?CLINICAL INFORMATION/HISTORY: 61 year old right-handed gentleman with an underlying medical history of hypertension, hyperlipidemia, sickle cell anemia, chronic rhinitis, coronary artery disease, reflux disease, prediabetes, BPH, smoking, overactive bladder and obesity, who reports snoring and excessive daytime somnolence as well as vivid dreams, and acting out his dreams. ? ?Epworth sleepiness score: 15/24. ? ?BMI: 32.2 kg/m? ? ?FINDINGS:  ? ?Sleep Summary:  ? ?Total Recording Time (hours, min): 7 hours, 45 minutes ? ?Total Sleep Time (hours, min):  3 hours, 49 minutes  ? ?Percent REM (%):    11.8%  ? ?Respiratory Indices:  ? ?Calculated pAHI (per hour):  15.1/hour        ? ?REM pAHI:    n/a      ? ?Oxygen Saturation Statistics:  ?  ?Oxygen Saturation (%) Mean: 93%  ? ?Minimum oxygen saturation (%):                 88%  ? ?O2 Saturation Range (%): 88 - 98%   ? ?O2 Saturation (minutes) <=88%: 0 min ? ?Pulse Rate Statistics:  ? ?Pulse Mean (bpm):    72/min   ? ?Pulse Range (53 - 124/min)  ? ?IMPRESSION: OSA (obstructive sleep apnea) ? ?RECOMMENDATION:  ?This home sleep test demonstrates moderate obstructive sleep apnea with a total AHI of 15.1/hour and O2 nadir of 88%. Treatment with positive airway pressure is recommended. The patient will be advised to proceed with an autoPAP titration/trial at home for now. A full night titration study may be considered to optimize treatment settings, if needed down the road.  Alternative treatment options may include a dental device in selected patients or a hypoglossal nerve stimulator.  Concomitant weight loss is recommended.  Please note that untreated obstructive sleep apnea may carry additional perioperative morbidity. Patients with significant  obstructive sleep apnea should receive perioperative PAP therapy and the surgeons and particularly the anesthesiologist should be informed of the diagnosis and the severity of the sleep disordered breathing. ?The patient should be cautioned not to drive, work at heights, or operate dangerous or heavy equipment when tired or sleepy. Review and reiteration of good sleep hygiene measures should be pursued with any patient. ?Other causes of the patient's symptoms, including circadian rhythm disturbances, an underlying mood disorder, medication effect and/or an underlying medical problem cannot be ruled out based on this test. Clinical correlation is recommended. The patient and his referring provider will be notified of the test results. The patient will be seen in follow up in sleep clinic at [facility]. ? ?I certify that I have reviewed the raw data recording prior to the issuance of this report in accordance with the standards of the American Academy of Sleep Medicine (AASM). ? ? ?INTERPRETING PHYSICIAN:  ? ?Star Age, MD, PhD  ?Board Certified in Neurology and Sleep Medicine ? ?[facility] ?[address], [address] ?[place], [place] [address] ?[phone] ? ? ? ? ? ? ? ? ? ? ? ? ? ? ? ? ? ? ? ?

## 2022-03-02 ENCOUNTER — Telehealth: Payer: Self-pay | Admitting: *Deleted

## 2022-03-02 ENCOUNTER — Encounter: Payer: Self-pay | Admitting: *Deleted

## 2022-03-02 NOTE — Telephone Encounter (Signed)
I called pt. I advised pt that Dr. Rexene Alberts  reviewed their sleep study results and found that pt has moderate OSA. Dr. Rexene Alberts recommends that pt start autopap. I reviewed PAP compliance expectations with the pt. Pt is agreeable to starting an auto-PAP. I advised pt that an order will be sent to a DME, [facility], and they will call the pt within about one week after they file with the pt's insurance. [facility] will show the pt how to use the machine, fit for masks, and troubleshoot the auto-PAP if needed. A follow up appt was made for insurance purposes with Dr. Rexene Alberts on 05-19-2022 at [facility]. Pt verbalized understanding to arrive 15 minutes early and bring their auto-PAP. A letter with all of this information in it will be mailed to the pt as a reminder. I verified with the pt that the address we have on file is correct. Pt verbalized understanding of results. Pt had no questions at this time but was encouraged to call back if questions arise. I have sent the order to [facility] and have received confirmation that they have received the order.  ?

## 2022-03-02 NOTE — Telephone Encounter (Signed)
-----   Message from Star Age, MD sent at 03/01/2022 12:32 PM EDT ----- ?Patient referred by Dr. Ronnald Ramp, seen by me on 01/24/2022, patient had a HST on 02/24/2022.   ? ?Please call and notify the patient that the recent home sleep test showed obstructive sleep apnea in the moderate range. I recommend treatment in the form of autoPAP, which means, that we don't have to bring him in for a sleep study with CPAP, but will let him start using a so called autoPAP machine at home, which is a CPAP-like machine with self-adjusting pressures. We will send the order to a local DME company (of his choice, or as per insurance requirement). The DME representative will fit him with a mask, educate him on how to use the machine, how to put the mask on, etc. I have placed an order in the chart. Please send the order, talk to patient, send report to referring MD. We will need a FU in sleep clinic for 10 weeks post-PAP set up, please arrange that with me or one of our NPs. Also reinforce the need for compliance with treatment. Thanks,  ? ?Star Age, MD, PhD ?[facility]) ? ? ? ? ?

## 2022-03-03 NOTE — Telephone Encounter (Signed)
Denyse Amass, RN; Vanessa Ralphs ?got it   ? ?  ?Previous Messages ?  ?----- Message -----  ?From: Brandon Melnick, RN  ?Sent: 03/02/2022   5:27 PM EDT  ?To: Ocie Bob  ? ?Pt has new order in Epic for new user autopap.  He states he does not like full face mask, is claustrophobic.    ?Lorene Dy. Demps  ?Male, 61 y.o., 1961-01-07  ?MRN:  [id number]  ?Phone:  ?[phone]  ? ?Thanks  ?Lovey Newcomer Rn  ? ?

## 2022-05-06 ENCOUNTER — Ambulatory Visit: Payer: Self-pay | Admitting: Family Medicine

## 2022-05-06 ENCOUNTER — Ambulatory Visit (INDEPENDENT_AMBULATORY_CARE_PROVIDER_SITE_OTHER): Payer: Self-pay | Admitting: Internal Medicine

## 2022-05-06 ENCOUNTER — Encounter: Payer: Self-pay | Admitting: Internal Medicine

## 2022-05-06 VITALS — BP 132/80 | HR 79 | Temp 98.1°F | Ht 72.0 in | Wt 238.0 lb

## 2022-05-06 DIAGNOSIS — M545 Low back pain, unspecified: Secondary | ICD-10-CM

## 2022-05-06 DIAGNOSIS — F17200 Nicotine dependence, unspecified, uncomplicated: Secondary | ICD-10-CM

## 2022-05-06 DIAGNOSIS — Z1211 Encounter for screening for malignant neoplasm of colon: Secondary | ICD-10-CM

## 2022-05-06 DIAGNOSIS — R7303 Prediabetes: Secondary | ICD-10-CM

## 2022-05-06 DIAGNOSIS — I10 Essential (primary) hypertension: Secondary | ICD-10-CM

## 2022-05-06 DIAGNOSIS — K5904 Chronic idiopathic constipation: Secondary | ICD-10-CM

## 2022-05-06 MED ORDER — CYCLOBENZAPRINE HCL 5 MG PO TABS: 5.0000 mg | ORAL_TABLET | Freq: Three times a day (TID) | ORAL | 1 refills | Status: DC | PRN

## 2022-05-06 MED ORDER — TRAMADOL HCL 50 MG PO TABS
50.0000 mg | ORAL_TABLET | Freq: Four times a day (QID) | ORAL | 0 refills | Status: DC | PRN
Start: 2022-05-06 — End: 2022-05-11

## 2022-05-06 MED ORDER — TRULANCE 3 MG PO TABS: ORAL_TABLET | ORAL | 3 refills | Status: DC

## 2022-05-06 NOTE — Progress Notes (Unsigned)
Patient ID: [name], male   DOB: 1961/03/24, 61 y.o.   MRN: [id number]        Chief Complaint: follow up with right low back pain, constipation, smoking, preDM, htn       HPI:  [name] is a 61 y.o. male here with c/o acute onset right low and flank back pain, constant, moderate x 1 wk, worse to stand up and bend and twist, and has no radicular symptoms.  Cannot recall any recent injury, heavy lifting.  Denies urinary symptoms such as dysuria, frequency, urgency, flank pain, hematuria or n/v, fever, chills.  Denies worsening reflux, abd pain, dysphagia, n/v, or blood, but has ongoing constipation for several wks and wondering if this is related to his back pain.  Admits to much worsening sitting straining at stool before the back pain started.   Pt denies fever, wt loss, night sweats, loss of appetite, or other constitutional symptoms  Still smoking, not ready to quit.   Pt denies polydipsia, polyuria, or new focal neuro s/s.       Wt Readings from Last 3 Encounters:  05/06/22 238 lb (108 kg)  02/08/22 235 lb 7.2 oz (106.8 kg)  01/24/22 238 lb (108 kg)   BP Readings from Last 3 Encounters:  05/06/22 132/80  02/08/22 (!) 138/91  01/24/22 108/76         Past Medical History:  Diagnosis Date   BPH (benign prostatic hyperplasia)    Chronic rhinitis    Coronary atherosclerosis of unspecified type of vessel, native or graft    Dysmetabolic syndrome X    Erectile dysfunction    Hypertrophy of prostate with urinary obstruction and other lower urinary tract symptoms (LUTS)    Obstructive sleep apnea (adult) (pediatric)    no CPAP, has lost weight   Prediabetes    Primary hypertension 11/03/2021   Problems related to high-risk sexual behavior    Pure hypercholesterolemia    Sickle cell trait ([place])    Tobacco use disorder    Past Surgical History:  Procedure Laterality Date   COSMETIC SURGERY     HERNIA REPAIR     UHR  AT [facility]  <5 YRS AGO    INCISION AND DRAINAGE ABSCESS /  HEMATOMA OF BURSA / KNEE / THIGH     I&D of complex abscess,left axilla. I&D of large infected pilonidal abscess   KNEE ARTHROSCOPY WITH MENISCAL REPAIR Right 02/08/2022   Procedure: RIGHT KNEE PARTIAL MEDIAL MENISCECTOMY;  Surgeon: Georgeanna Harrison, MD;  Location: [facility];  Service: Orthopedics;  Laterality: Right;   LAPAROSCOPIC PARTIAL COLECTOMY N/A 03/10/2015   Procedure: LAPAROSCOPIC ASSISTED RIGHT COLECTOMY;  Surgeon: Rolm Bookbinder, MD;  Location: [facility];  Service: General;  Laterality: N/A;   MASS EXCISION N/A 10/19/2015   Procedure: EXCISION BACK MASS;  Surgeon: Rolm Bookbinder, MD;  Location: [facility];  Service: General;  Laterality: N/A;   PTCA  2010    reports that he has been smoking cigars. He has never used smokeless tobacco. He reports current alcohol use. He reports that he does not use drugs. family history includes Breast cancer in his mother; Colon cancer in his maternal grandfather, maternal grandmother, and another family member; Heart attack in his father; Prostate cancer in his maternal grandfather; Stroke in his maternal grandmother. Allergies  Allergen Reactions   Penicillins Hives and Itching    Tolerated ancef without incident   Current Outpatient Medications on File Prior to Visit  Medication  Sig Dispense Refill   aspirin EC 81 MG tablet Take 1 tablet (81 mg total) by mouth daily. 90 tablet 1   bisacodyl (DULCOLAX) 5 MG EC tablet Take 5 mg by mouth daily as needed for moderate constipation.     Multiple Vitamin (MULTI-VITAMIN) tablet Take by mouth.     meloxicam (MOBIC) 15 MG tablet Take 15 mg by mouth daily. (Patient not taking: Reported on 05/06/2022)     promethazine (PHENERGAN) 12.5 MG tablet Take 1 tablet (12.5 mg total) by mouth every 6 (six) hours as needed for nausea or vomiting. (Patient not taking: Reported on 05/06/2022) 30 tablet 0   tamsulosin (FLOMAX) 0.4 MG CAPS capsule Take 0.4 mg by mouth. (Patient not taking:  Reported on 05/06/2022)     No current facility-administered medications on file prior to visit.        ROS:  All others reviewed and negative.  Objective        PE:  BP 132/80 (BP Location: Right Arm, Patient Position: Sitting, Cuff Size: Large)   Pulse 79   Temp 98.1 F (36.7 C) (Oral)   Ht 6' (1.829 m)   Wt 238 lb (108 kg)   SpO2 96%   BMI 32.28 kg/m                 Constitutional: Pt appears in NAD               HENT: Head: NCAT.                Right Ear: External ear normal.                 Left Ear: External ear normal.                Eyes: . Pupils are equal, round, and reactive to light. Conjunctivae and EOM are normal               Nose: without d/c or deformity               Neck: Neck supple. Gross normal ROM               Cardiovascular: Normal rate and regular rhythm.                 Pulmonary/Chest: Effort normal and breath sounds without rales or wheezing.                Abd:  Soft, NT, ND, + BS, no organomegaly               Spine nontender in midline throughout               Has large area right flank and lower back spasm and tender               Neurological: Pt is alert. At baseline orientation, motor grossly intact               Skin: Skin is warm. No rashes, no other new lesions, LE edema - none               Psychiatric: Pt behavior is normal without agitation   Micro: none  Cardiac tracings I have personally interpreted today:  none  Pertinent Radiological findings (summarize): none   Lab Results  Component Value Date   WBC 5.0 11/03/2021   HGB 14.1 11/03/2021   HCT 43.0 11/03/2021   PLT 242.0 11/03/2021   GLUCOSE 89  11/03/2021   CHOL 168 11/03/2021   TRIG 122.0 11/03/2021   HDL 50.90 11/03/2021   LDLDIRECT 132.4 07/29/2008   LDLCALC 93 11/03/2021   ALT 21 11/03/2021   AST 21 11/03/2021   NA 141 11/03/2021   K 4.4 11/03/2021   CL 106 11/03/2021   CREATININE 1.14 11/03/2021   BUN 10 11/03/2021   CO2 31 11/03/2021   TSH 1.08 11/03/2021    PSA 3.08 11/03/2021   HGBA1C 6.2 11/03/2021   MICROALBUR 1.2 03/22/2012   Assessment/Plan:  [name] is a 61 y.o. Black or African American [2] male with  has a past medical history of BPH (benign prostatic hyperplasia), Chronic rhinitis, Coronary atherosclerosis of unspecified type of vessel, native or graft, Dysmetabolic syndrome X, Erectile dysfunction, Hypertrophy of prostate with urinary obstruction and other lower urinary tract symptoms (LUTS), Obstructive sleep apnea (adult) (pediatric), Prediabetes, Primary hypertension (11/03/2021), Problems related to high-risk sexual behavior, Pure hypercholesterolemia, Sickle cell trait ([place]), and Tobacco use disorder.  Low back pain Pt with large area msk spasm to right flank and right lower back  Ost likely onset with severe straining at stool with ongoing constiopation - for tramadol ( pt understand risk of worsening constipation), flexeril prn, work note given  Primary hypertension BP Readings from Last 3 Encounters:  05/06/22 132/80  02/08/22 (!) 138/91  01/24/22 108/76   Stable, pt to continue medical treatment  - diet, wt control, excercise   Chronic idiopathic constipation Recent tx not helping, uncontrolled, for trulance samples, for rx if improved, also refer colonoscopy as is due for routine screening  Prediabetes Lab Results  Component Value Date   HGBA1C 6.2 11/03/2021   Stable, pt to continue current medical treatment  - diet, wt control, excercise   TOBACCO ABUSE Pt counseled to quit, pt not ready  Followup: Return if symptoms worsen or fail to improve.  Cathlean Cower, MD 05/08/2022 11:10 AM [place]

## 2022-05-06 NOTE — Patient Instructions (Signed)
Please take all new medication as prescribed - the pain medicine, muscle relaxer  You are given the work note  You are given the Trulance samples and prescription for constipation  Please continue all other medications as before, and refills have been done if requested.  Please have the pharmacy call with any other refills you may need.  Please keep your appointments with your specialists as you may have planned  You will be contacted regarding the referral for: Colonoscopy

## 2022-05-08 ENCOUNTER — Encounter: Payer: Self-pay | Admitting: Internal Medicine

## 2022-05-08 NOTE — Assessment & Plan Note (Signed)
BP Readings from Last 3 Encounters:  05/06/22 132/80  02/08/22 (!) 138/91  01/24/22 108/76   Stable, pt to continue medical treatment  - diet, wt control, excercise

## 2022-05-08 NOTE — Assessment & Plan Note (Signed)
Recent tx not helping, uncontrolled, for trulance samples, for rx if improved, also refer colonoscopy as is due for routine screening

## 2022-05-08 NOTE — Assessment & Plan Note (Signed)
Pt counseled to quit, pt not ready 

## 2022-05-08 NOTE — Assessment & Plan Note (Signed)
Pt with large area msk spasm to right flank and right lower back  Ost likely onset with severe straining at stool with ongoing constiopation - for tramadol ( pt understand risk of worsening constipation), flexeril prn, work note given

## 2022-05-08 NOTE — Assessment & Plan Note (Signed)
Lab Results  Component Value Date   HGBA1C 6.2 11/03/2021   Stable, pt to continue current medical treatment  - diet, wt control, excercise

## 2022-05-09 ENCOUNTER — Telehealth: Payer: Self-pay | Admitting: Internal Medicine

## 2022-05-09 NOTE — Telephone Encounter (Signed)
Type of form received- [employer] & [employer]   Form placed in- Provider mailbox  Additional instructions from the patient: Please try to have completed by time of next appt.    Things to remember: [place] office: If form received in person, remind patient that forms take 7-10 business days CMA should attach charge sheet and put on Supervisor's desk

## 2022-05-11 ENCOUNTER — Encounter: Payer: Self-pay | Admitting: Internal Medicine

## 2022-05-11 ENCOUNTER — Ambulatory Visit (INDEPENDENT_AMBULATORY_CARE_PROVIDER_SITE_OTHER): Payer: Self-pay | Admitting: Internal Medicine

## 2022-05-11 VITALS — BP 126/84 | HR 91 | Temp 98.0°F | Ht 72.0 in | Wt 237.0 lb

## 2022-05-11 DIAGNOSIS — G4733 Obstructive sleep apnea (adult) (pediatric): Secondary | ICD-10-CM

## 2022-05-11 DIAGNOSIS — I10 Essential (primary) hypertension: Secondary | ICD-10-CM

## 2022-05-11 DIAGNOSIS — R7303 Prediabetes: Secondary | ICD-10-CM

## 2022-05-11 DIAGNOSIS — K5904 Chronic idiopathic constipation: Secondary | ICD-10-CM

## 2022-05-11 LAB — BASIC METABOLIC PANEL
BUN: 10 mg/dL (ref 6–23)
CO2: 26 mEq/L (ref 19–32)
Calcium: 9.4 mg/dL (ref 8.4–10.5)
Chloride: 103 mEq/L (ref 96–112)
Creatinine, Ser: 1.14 mg/dL (ref 0.40–1.50)
GFR: 70.72 mL/min (ref >60.00)
Glucose, Bld: 92 mg/dL (ref 70–99)
Potassium: 4.1 mEq/L (ref 3.5–5.1)
Sodium: 139 mEq/L (ref 135–145)

## 2022-05-11 LAB — HEMOGLOBIN A1C: Hgb A1c MFr Bld: 6 % (ref 4.6–6.5)

## 2022-05-11 MED ORDER — LUBIPROSTONE 24 MCG PO CAPS: 24.0000 ug | ORAL_CAPSULE | Freq: Two times a day (BID) | ORAL | 1 refills | Status: DC

## 2022-05-11 NOTE — Patient Instructions (Signed)

## 2022-05-11 NOTE — Telephone Encounter (Signed)
PT called requesting that Dr. Ronnald Ramp fills out the last page of the paperwork that was signed today. Stated he is on his way now.

## 2022-05-11 NOTE — Telephone Encounter (Signed)
PT visits and is informed that final page needed to be filled out by Dr.John instead of Jones. Form has been left with Lovena Le. PT would like to be notified once form is filled out!  CB: [phone]

## 2022-05-11 NOTE — Progress Notes (Signed)
Subjective:  Patient ID: [name], male    DOB: 1960/12/06  Age: 61 y.o. MRN: [id number]  CC: Hypertension   HPI NORMAN PIACENTINI presents for f/up -  He recently saw someone else for muscle spasms and was prescribed tramadol and Flexeril.  He tells me he is not currently taking either 1 of those and the musculoskeletal pain has resolved.  He is active and denies chest pain, shortness of breath, diaphoresis, or edema.  He tells me that Trulance is too expensive and he needs something different for constipation.  Outpatient Medications Prior to Visit  Medication Sig Dispense Refill   aspirin EC 81 MG tablet Take 1 tablet (81 mg total) by mouth daily. 90 tablet 1   Multiple Vitamin (MULTI-VITAMIN) tablet Take by mouth.     bisacodyl (DULCOLAX) 5 MG EC tablet Take 5 mg by mouth daily as needed for moderate constipation.     cyclobenzaprine (FLEXERIL) 5 MG tablet Take 1 tablet (5 mg total) by mouth 3 (three) times daily as needed for muscle spasms. 40 tablet 1   meloxicam (MOBIC) 15 MG tablet Take 15 mg by mouth daily.     Plecanatide (TRULANCE) 3 MG TABS 1 tab by mouth once daily 90 tablet 3   promethazine (PHENERGAN) 12.5 MG tablet Take 1 tablet (12.5 mg total) by mouth every 6 (six) hours as needed for nausea or vomiting. 30 tablet 0   tamsulosin (FLOMAX) 0.4 MG CAPS capsule Take 0.4 mg by mouth.     traMADol (ULTRAM) 50 MG tablet Take 1 tablet (50 mg total) by mouth every 6 (six) hours as needed. 30 tablet 0   No facility-administered medications prior to visit.    ROS Review of Systems  Constitutional: Negative.  Negative for diaphoresis and fatigue.  HENT: Negative.    Eyes: Negative.   Respiratory:  Positive for apnea. Negative for cough, shortness of breath and wheezing.   Cardiovascular:  Negative for chest pain, palpitations and leg swelling.  Gastrointestinal:  Positive for constipation. Negative for abdominal pain, nausea and vomiting.  Endocrine: Negative.    Genitourinary: Negative.  Negative for difficulty urinating and hematuria.  Musculoskeletal: Negative.  Negative for myalgias.  Skin: Negative.   Neurological:  Negative for dizziness, weakness, light-headedness and headaches.  Hematological:  Negative for adenopathy. Does not bruise/bleed easily.  Psychiatric/Behavioral: Negative.      Objective:  BP 126/84 (BP Location: Left Arm, Patient Position: Sitting, Cuff Size: Large)   Pulse 91   Temp 98 F (36.7 C) (Oral)   Ht 6' (1.829 m)   Wt 237 lb (107.5 kg)   SpO2 95%   BMI 32.14 kg/m   BP Readings from Last 3 Encounters:  05/11/22 126/84  05/06/22 132/80  02/08/22 (!) 138/91    Wt Readings from Last 3 Encounters:  05/11/22 237 lb (107.5 kg)  05/06/22 238 lb (108 kg)  02/08/22 235 lb 7.2 oz (106.8 kg)    Physical Exam Vitals reviewed.  HENT:     Nose: Nose normal.     Mouth/Throat:     Mouth: Mucous membranes are moist.  Eyes:     General: No scleral icterus.    Conjunctiva/sclera: Conjunctivae normal.  Cardiovascular:     Rate and Rhythm: Normal rate and regular rhythm.     Heart sounds: No murmur heard. Pulmonary:     Effort: Pulmonary effort is normal.     Breath sounds: No stridor. No wheezing, rhonchi or rales.  Abdominal:  General: Abdomen is flat.     Palpations: There is no mass.     Tenderness: There is no abdominal tenderness. There is no guarding.     Hernia: No hernia is present.  Musculoskeletal:        General: Normal range of motion.     Cervical back: Neck supple.     Right lower leg: No edema.     Left lower leg: No edema.  Lymphadenopathy:     Cervical: No cervical adenopathy.  Skin:    General: Skin is warm and dry.  Neurological:     General: No focal deficit present.     Mental Status: He is alert. Mental status is at baseline.  Psychiatric:        Mood and Affect: Mood normal.        Behavior: Behavior normal.     Lab Results  Component Value Date   WBC 5.0 11/03/2021    HGB 14.1 11/03/2021   HCT 43.0 11/03/2021   PLT 242.0 11/03/2021   GLUCOSE 92 05/11/2022   CHOL 168 11/03/2021   TRIG 122.0 11/03/2021   HDL 50.90 11/03/2021   LDLDIRECT 132.4 07/29/2008   LDLCALC 93 11/03/2021   ALT 21 11/03/2021   AST 21 11/03/2021   NA 139 05/11/2022   K 4.1 05/11/2022   CL 103 05/11/2022   CREATININE 1.14 05/11/2022   BUN 10 05/11/2022   CO2 26 05/11/2022   TSH 1.08 11/03/2021   PSA 3.08 11/03/2021   HGBA1C 6.0 05/11/2022   MICROALBUR 1.2 03/22/2012    No results found.  Assessment & Plan:   Preciliano was seen today for hypertension.  Diagnoses and all orders for this visit:  Chronic idiopathic constipation -     lubiprostone (AMITIZA) 24 MCG capsule; Take 1 capsule (24 mcg total) by mouth 2 (two) times daily with a meal. -     Basic metabolic panel; Future -     Basic metabolic panel  OBSTRUCTIVE SLEEP APNEA -     Ambulatory referral to Sleep Studies  Primary hypertension- His blood pressure is adequately well controlled. -     Basic metabolic panel; Future -     Basic metabolic panel  Prediabetes- His A1c is down to 6.0%. -     Basic metabolic panel; Future -     Hemoglobin A1c; Future -     Hemoglobin A1c -     Basic metabolic panel   I have discontinued Alvester Chou T. Groesbeck's meloxicam, tamsulosin, promethazine, bisacodyl, traMADol, cyclobenzaprine, and Trulance. I am also having him start on lubiprostone. Additionally, I am having him maintain his Multi-Vitamin and aspirin EC.  Meds ordered this encounter  Medications   lubiprostone (AMITIZA) 24 MCG capsule    Sig: Take 1 capsule (24 mcg total) by mouth 2 (two) times daily with a meal.    Dispense:  180 capsule    Refill:  1     Follow-up: Return in about 6 months (around 11/10/2022).  Scarlette Calico, MD

## 2022-05-13 NOTE — Telephone Encounter (Signed)
Paperwork is being completed by provider. Voicemail left to notify patient

## 2022-05-18 ENCOUNTER — Telehealth: Payer: Self-pay | Admitting: Neurology

## 2022-05-18 NOTE — Telephone Encounter (Signed)
FYI to POD 4, pt called initially to r/s the appointment, pt declined a my chart vv. Pt declined r/s at this time due to currently being without insurance for the next 30 days. The importance of the initial CPAP f/u was explained. Pt states once he has reviewed his work schedule and his insurance is effective again he will r/s.  This again is FYI pt has not asked for a call back.   In an attempt to offer assistance to get pt scheduled Lovey Newcomer, RN advised of the following:he can do a vidieo visit, but he is not compliant needs to use 4 plus hours anight so insurance will continue to pay for his machine. and supplies. he can delay until  july 24 if that work using Occupational psychologist or np that has appt date applicable

## 2022-05-19 ENCOUNTER — Ambulatory Visit: Payer: Self-pay | Admitting: Neurology

## 2022-06-29 ENCOUNTER — Other Ambulatory Visit: Payer: Self-pay | Admitting: Urology

## 2022-06-29 DIAGNOSIS — R972 Elevated prostate specific antigen [PSA]: Secondary | ICD-10-CM

## 2022-06-29 DIAGNOSIS — R350 Frequency of micturition: Secondary | ICD-10-CM

## 2022-06-29 DIAGNOSIS — N401 Enlarged prostate with lower urinary tract symptoms: Secondary | ICD-10-CM

## 2022-07-12 ENCOUNTER — Ambulatory Visit
Admission: RE | Admit: 2022-07-12 | Discharge: 2022-07-12 | Disposition: A | Discharge status: Home / Self Care | Payer: Self-pay | Source: Ambulatory Visit | Attending: Urology | Admitting: Urology

## 2022-07-12 DIAGNOSIS — N401 Enlarged prostate with lower urinary tract symptoms: Secondary | ICD-10-CM

## 2022-07-12 DIAGNOSIS — R350 Frequency of micturition: Secondary | ICD-10-CM

## 2022-07-12 DIAGNOSIS — R972 Elevated prostate specific antigen [PSA]: Secondary | ICD-10-CM

## 2022-07-12 MED ORDER — GADOBENATE DIMEGLUMINE 529 MG/ML IV SOLN
20.0000 mL | Freq: Once | INTRAVENOUS | Status: AC | PRN
  Administered 2022-07-12: 20 mL via INTRAVENOUS

## 2022-07-22 ENCOUNTER — Ambulatory Visit: Payer: 59 | Admitting: Family Medicine

## 2022-07-22 ENCOUNTER — Telehealth: Payer: Self-pay

## 2022-07-25 ENCOUNTER — Ambulatory Visit (INDEPENDENT_AMBULATORY_CARE_PROVIDER_SITE_OTHER): Payer: 59 | Admitting: Family Medicine

## 2022-07-25 ENCOUNTER — Encounter: Payer: Self-pay | Admitting: Family Medicine

## 2022-07-25 VITALS — BP 144/88 | HR 72 | Resp 97 | Ht 72.0 in | Wt 240.0 lb

## 2022-07-25 VITALS — BP 144/88 | HR 72 | Temp 97.8°F | Ht 72.0 in | Wt 240.0 lb

## 2022-07-25 DIAGNOSIS — M25561 Pain in right knee: Secondary | ICD-10-CM | POA: Diagnosis not present

## 2022-07-25 DIAGNOSIS — R0781 Pleurodynia: Secondary | ICD-10-CM

## 2022-07-25 DIAGNOSIS — M79661 Pain in right lower leg: Secondary | ICD-10-CM

## 2022-07-25 DIAGNOSIS — M7989 Other specified soft tissue disorders: Secondary | ICD-10-CM

## 2022-07-25 DIAGNOSIS — S41112A Laceration without foreign body of left upper arm, initial encounter: Secondary | ICD-10-CM | POA: Insufficient documentation

## 2022-07-25 DIAGNOSIS — E041 Nontoxic single thyroid nodule: Secondary | ICD-10-CM | POA: Diagnosis not present

## 2022-07-25 DIAGNOSIS — M542 Cervicalgia: Secondary | ICD-10-CM | POA: Diagnosis not present

## 2022-07-25 DIAGNOSIS — M25522 Pain in left elbow: Secondary | ICD-10-CM

## 2022-07-25 LAB — TSH: TSH: 1.85 u[IU]/mL (ref 0.35–5.50)

## 2022-07-25 LAB — T3, FREE: T3, Free: 3 pg/mL (ref 2.3–4.2)

## 2022-07-25 LAB — T4, FREE: Free T4: 0.86 ng/dL (ref 0.60–1.60)

## 2022-07-25 MED ORDER — MUPIROCIN 2 % EX OINT: 1.0000 | TOPICAL_OINTMENT | Freq: Two times a day (BID) | CUTANEOUS | 0 refills | Status: DC
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
## 2022-07-25 NOTE — Patient Instructions (Signed)
Please go downstairs for labs and also to see Dr. Georgina Snell at 11 AM.  You will receive a call from [facility] imaging to schedule a thyroid ultrasound.
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
## 2022-07-25 NOTE — Assessment & Plan Note (Signed)
Referral to [facility]. Dr. Georgina Snell will see patient today.
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
## 2022-07-25 NOTE — Assessment & Plan Note (Signed)
Referral to [facility]. Dr. Georgina Snell will see patient today.

## 2022-07-25 NOTE — Patient Instructions (Addendum)
Thank you for coming in today.   Plan for PT.   Recheck in 1 weeks.   Let me know if this is not going well.

## 2022-07-25 NOTE — Assessment & Plan Note (Signed)
incidental 2.4 x 2.4 cm nodule observed on CT chest. Check thyroid labs and [place] ordered.

## 2022-07-25 NOTE — Assessment & Plan Note (Signed)
Contusion, healing

## 2022-07-26 ENCOUNTER — Ambulatory Visit
Admission: RE | Admit: 2022-07-26 | Discharge: 2022-07-26 | Disposition: A | Discharge status: Home / Self Care | Payer: 59 | Source: Ambulatory Visit | Attending: Family Medicine | Admitting: Family Medicine

## 2022-07-26 ENCOUNTER — Other Ambulatory Visit: Payer: Self-pay | Admitting: Family Medicine

## 2022-07-26 DIAGNOSIS — E041 Nontoxic single thyroid nodule: Secondary | ICD-10-CM

## 2022-07-26 DIAGNOSIS — E042 Nontoxic multinodular goiter: Secondary | ICD-10-CM

## 2022-07-26 NOTE — Progress Notes (Signed)
Please contact patient to let him know that 2 nodules on thyroid ultrasound meets criteria for biopsy (fine needle aspiration). I have ordered this to be done and they will call him to schedule this. They will be able to give him more information once the procedure is done.

## 2022-07-27 ENCOUNTER — Other Ambulatory Visit: Payer: Self-pay | Admitting: Family Medicine

## 2022-07-27 DIAGNOSIS — E042 Nontoxic multinodular goiter: Secondary | ICD-10-CM

## 2022-07-27 DIAGNOSIS — E041 Nontoxic single thyroid nodule: Secondary | ICD-10-CM

## 2022-07-29 ENCOUNTER — Other Ambulatory Visit (HOSPITAL_COMMUNITY)
Admission: RE | Admit: 2022-07-29 | Discharge: 2022-07-29 | Disposition: A | Discharge status: Home / Self Care | Payer: 59 | Source: Ambulatory Visit | Attending: Diagnostic Radiology | Admitting: Diagnostic Radiology

## 2022-07-29 ENCOUNTER — Ambulatory Visit
Admission: RE | Admit: 2022-07-29 | Discharge: 2022-07-29 | Disposition: A | Discharge status: Home / Self Care | Payer: 59 | Source: Ambulatory Visit | Attending: Family Medicine | Admitting: Family Medicine

## 2022-07-29 DIAGNOSIS — E041 Nontoxic single thyroid nodule: Secondary | ICD-10-CM

## 2022-07-29 DIAGNOSIS — E042 Nontoxic multinodular goiter: Secondary | ICD-10-CM | POA: Insufficient documentation

## 2022-08-02 ENCOUNTER — Ambulatory Visit: Payer: 59 | Admitting: Family Medicine
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
## 2022-08-03 LAB — CYTOLOGY - NON PAP

## 2022-08-05 ENCOUNTER — Ambulatory Visit: Payer: 59 | Admitting: Physical Therapy

## 2022-09-02 NOTE — Telephone Encounter (Signed)
error 

## 2022-11-18 ENCOUNTER — Ambulatory Visit (INDEPENDENT_AMBULATORY_CARE_PROVIDER_SITE_OTHER): Payer: Self-pay | Admitting: Family Medicine

## 2022-11-18 ENCOUNTER — Encounter: Payer: Self-pay | Admitting: Family Medicine

## 2022-11-18 ENCOUNTER — Ambulatory Visit (INDEPENDENT_AMBULATORY_CARE_PROVIDER_SITE_OTHER): Payer: Self-pay

## 2022-11-18 VITALS — BP 126/84 | HR 75 | Temp 97.6°F | Ht 72.0 in | Wt 240.0 lb

## 2022-11-18 DIAGNOSIS — R6883 Chills (without fever): Secondary | ICD-10-CM

## 2022-11-18 DIAGNOSIS — R0602 Shortness of breath: Secondary | ICD-10-CM

## 2022-11-18 DIAGNOSIS — R0981 Nasal congestion: Secondary | ICD-10-CM

## 2022-11-18 DIAGNOSIS — R0989 Other specified symptoms and signs involving the circulatory and respiratory systems: Secondary | ICD-10-CM

## 2022-11-18 DIAGNOSIS — R5383 Other fatigue: Secondary | ICD-10-CM

## 2022-11-18 DIAGNOSIS — R051 Acute cough: Secondary | ICD-10-CM

## 2022-11-18 LAB — POC COVID19 BINAXNOW: SARS Coronavirus 2 Ag: NEGATIVE

## 2022-11-18 LAB — CBC WITH DIFFERENTIAL/PLATELET
Basophils Absolute: 0 10*3/uL (ref 0.0–0.1)
Basophils Relative: 1.2 % (ref 0.0–3.0)
Eosinophils Absolute: 0.1 10*3/uL (ref 0.0–0.7)
Eosinophils Relative: 3.7 % (ref 0.0–5.0)
HCT: 43.9 % (ref 39.0–52.0)
Hemoglobin: 14.4 g/dL (ref 13.0–17.0)
Lymphocytes Relative: 21.9 % (ref 12.0–46.0)
Lymphs Abs: 0.9 10*3/uL (ref 0.7–4.0)
MCHC: 32.9 g/dL (ref 30.0–36.0)
MCV: 85.1 fl (ref 78.0–100.0)
Monocytes Absolute: 0.8 10*3/uL (ref 0.1–1.0)
Monocytes Relative: 20.7 % — ABNORMAL HIGH (ref 3.0–12.0)
Neutro Abs: 2.1 10*3/uL (ref 1.4–7.7)
Neutrophils Relative %: 52.5 % (ref 43.0–77.0)
Platelets: 232 10*3/uL (ref 150.0–400.0)
RBC: 5.16 Mil/uL (ref 4.22–5.81)
RDW: 13.5 % (ref 11.5–15.5)
WBC: 3.9 10*3/uL — ABNORMAL LOW (ref 4.0–10.5)

## 2022-11-18 LAB — POCT RESPIRATORY SYNCYTIAL VIRUS: RSV Rapid Ag: NEGATIVE

## 2022-11-18 LAB — POCT INFLUENZA A/B
Influenza A, POC: NEGATIVE
Influenza B, POC: NEGATIVE

## 2022-11-18 MED ORDER — FLUTICASONE PROPIONATE 50 MCG/ACT NA SUSP: 2.0000 | Freq: Every day | NASAL | 1 refills | Status: DC

## 2022-11-18 MED ORDER — BENZONATATE 200 MG PO CAPS: 200.0000 mg | ORAL_CAPSULE | Freq: Two times a day (BID) | ORAL | 0 refills | Status: DC | PRN

## 2022-11-18 MED ORDER — AZITHROMYCIN 250 MG PO TABS: ORAL_TABLET | ORAL | 0 refills | Status: AC

## 2022-11-18 NOTE — Patient Instructions (Signed)
You are negative for COVID, RSV and flu in our office today.  Please go downstairs for labs and a chest x-ray.  Take the antibiotic as prescribed.  I also prescribed a nasal spray called Flonase and cough medication called Tessalon Perles for you to take.  I recommend taking Mucinex DM and Tylenol or ibuprofen as needed.  Follow-up if you are getting worse or not back to baseline in approximately 7 to 10 days.

## 2022-11-18 NOTE — Progress Notes (Signed)
Please call him and tell him that his x-ray is negative.  No change needed to the treatment plan.  I am still waiting on his CBC result and I will forward this to his MyChart

## 2022-11-18 NOTE — Progress Notes (Signed)
Subjective: Chief Complaint  Patient presents with   chest congestion    X3 days   Cough    Dry cough started x3 days   Fatigue    X3 days   Nasal Congestion    X 2 weeks     [name] is a 61 y.o. male who presents for a 2 wk hx of nasal congestion and 3 days ago he started having chills, body aches, fatigue, chest congestion, NP cough and sneezing.   No fever, dizziness, chest pain, abdominal pain, N/V/D  no other aggravating or relieving factors.  No other c/o.  ROS as in subjective.   Objective: Vitals:   11/18/22 1010  BP: 126/84  Pulse: 75  Temp: 97.6 F (36.4 C)  SpO2: 98%    General appearance: Alert, WD/WN, no distress, mildly ill appearing                             Skin: warm, no rash                           Head: no sinus tenderness                            Eyes: conjunctiva normal, corneas clear, PERRLA                            Ears: pearly TMs, external ear canals normal                          Nose: septum midline, turbinates swollen, with erythema              Mouth/throat: MMM, tongue normal, mild pharyngeal erythema                           Neck: supple, no adenopathy, no thyromegaly, nontender                          Heart: RRR                         Lungs: RLL rhonchi, no wheezing       Assessment: Shortness of breath - Plan: DG Chest 2 View, CBC with Differential/Platelet, azithromycin (ZITHROMAX) 250 MG tablet, fluticasone (FLONASE) 50 MCG/ACT nasal spray, CBC with Differential/Platelet  Acute cough - Plan: POCT respiratory syncytial virus, POC COVID-19, POCT Influenza A/B, DG Chest 2 View, CBC with Differential/Platelet, benzonatate (TESSALON) 200 MG capsule, CBC with Differential/Platelet  Fatigue, unspecified type - Plan: POCT respiratory syncytial virus, POC COVID-19, POCT Influenza A/B, DG Chest 2 View, CBC with Differential/Platelet, CBC with Differential/Platelet  Nasal congestion - Plan: POCT respiratory syncytial virus,  POC COVID-19, POCT Influenza A/B, fluticasone (FLONASE) 50 MCG/ACT nasal spray  Chills - Plan: POCT Influenza A/B  Abnormal lung sounds - Plan: DG Chest 2 View, CBC with Differential/Platelet, CBC with Differential/Platelet   Plan: He is negative for COVID, flu and RSV in the office today. Stat CBC and chest x-ray ordered. Azithromycin, fluticasone and Tessalon prescribed.  Suggested symptomatic OTC remedies. Tylenol or Ibuprofen OTC for fever and malaise.  Stay well-hydrated. Follow-up if worsening or not back to baseline in the next 10  days.

## 2022-12-10 ENCOUNTER — Other Ambulatory Visit: Payer: Self-pay | Admitting: Family Medicine

## 2022-12-10 DIAGNOSIS — R0981 Nasal congestion: Secondary | ICD-10-CM

## 2022-12-10 DIAGNOSIS — R0602 Shortness of breath: Secondary | ICD-10-CM

## 2023-01-04 ENCOUNTER — Encounter: Payer: Self-pay | Admitting: Internal Medicine

## 2023-01-04 LAB — PSA: PSA: 2.9

## 2023-01-10 ENCOUNTER — Ambulatory Visit: Payer: Commercial Managed Care - PPO | Admitting: Internal Medicine

## 2023-01-10 ENCOUNTER — Ambulatory Visit (INDEPENDENT_AMBULATORY_CARE_PROVIDER_SITE_OTHER): Payer: Commercial Managed Care - PPO

## 2023-01-10 ENCOUNTER — Other Ambulatory Visit: Payer: Self-pay

## 2023-01-10 ENCOUNTER — Encounter: Payer: Self-pay | Admitting: Internal Medicine

## 2023-01-10 VITALS — BP 136/80 | HR 75 | Temp 98.6°F | Ht 72.0 in | Wt 241.0 lb

## 2023-01-10 DIAGNOSIS — Z23 Encounter for immunization: Secondary | ICD-10-CM | POA: Diagnosis not present

## 2023-01-10 DIAGNOSIS — E785 Hyperlipidemia, unspecified: Secondary | ICD-10-CM | POA: Diagnosis not present

## 2023-01-10 DIAGNOSIS — I10 Essential (primary) hypertension: Secondary | ICD-10-CM

## 2023-01-10 DIAGNOSIS — F17200 Nicotine dependence, unspecified, uncomplicated: Secondary | ICD-10-CM | POA: Diagnosis not present

## 2023-01-10 DIAGNOSIS — Z1211 Encounter for screening for malignant neoplasm of colon: Secondary | ICD-10-CM | POA: Insufficient documentation

## 2023-01-10 DIAGNOSIS — Z0001 Encounter for general adult medical examination with abnormal findings: Secondary | ICD-10-CM

## 2023-01-10 DIAGNOSIS — R052 Subacute cough: Secondary | ICD-10-CM | POA: Diagnosis not present

## 2023-01-10 DIAGNOSIS — R7303 Prediabetes: Secondary | ICD-10-CM

## 2023-01-10 DIAGNOSIS — Z0000 Encounter for general adult medical examination without abnormal findings: Secondary | ICD-10-CM | POA: Diagnosis not present

## 2023-01-10 LAB — BASIC METABOLIC PANEL
BUN: 15 mg/dL (ref 6–23)
CO2: 31 mEq/L (ref 19–32)
Calcium: 9 mg/dL (ref 8.4–10.5)
Chloride: 103 mEq/L (ref 96–112)
Creatinine, Ser: 1.24 mg/dL (ref 0.40–1.50)
GFR: 63.63 mL/min (ref >60.00)
Glucose, Bld: 90 mg/dL (ref 70–99)
Potassium: 4.5 mEq/L (ref 3.5–5.1)
Sodium: 139 mEq/L (ref 135–145)

## 2023-01-10 LAB — HEPATIC FUNCTION PANEL
ALT: 18 U/L (ref 0–53)
AST: 15 U/L (ref 0–37)
Albumin: 4.1 g/dL (ref 3.5–5.2)
Alkaline Phosphatase: 83 U/L (ref 39–117)
Bilirubin, Direct: 0.1 mg/dL (ref 0.0–0.3)
Total Bilirubin: 0.5 mg/dL (ref 0.2–1.2)
Total Protein: 7.1 g/dL (ref 6.0–8.3)

## 2023-01-10 LAB — TSH: TSH: 1.16 u[IU]/mL (ref 0.35–5.50)

## 2023-01-10 LAB — LIPID PANEL
Cholesterol: 168 mg/dL (ref 0–200)
HDL: 51.6 mg/dL (ref >39.00)
LDL Cholesterol: 92 mg/dL (ref 0–99)
NonHDL: 116.31
Total CHOL/HDL Ratio: 3
Triglycerides: 124 mg/dL (ref 0.0–149.0)
VLDL: 24.8 mg/dL (ref 0.0–40.0)

## 2023-01-10 LAB — HEMOGLOBIN A1C: Hgb A1c MFr Bld: 6.1 % (ref 4.6–6.5)

## 2023-01-10 NOTE — Patient Instructions (Signed)
Health Maintenance, Male Adopting a healthy lifestyle and getting preventive care are important in promoting health and wellness. Ask your health care provider about: The right schedule for you to have regular tests and exams. Things you can do on your own to prevent diseases and keep yourself healthy. What should I know about diet, weight, and exercise? Eat a healthy diet  Eat a diet that includes plenty of vegetables, fruits, low-fat dairy products, and lean protein. Do not eat a lot of foods that are high in solid fats, added sugars, or sodium. Maintain a healthy weight Body mass index (BMI) is a measurement that can be used to identify possible weight problems. It estimates body fat based on height and weight. Your health care provider can help determine your BMI and help you achieve or maintain a healthy weight. Get regular exercise Get regular exercise. This is one of the most important things you can do for your health. Most adults should: Exercise for at least 150 minutes each week. The exercise should increase your heart rate and make you sweat (moderate-intensity exercise). Do strengthening exercises at least twice a week. This is in addition to the moderate-intensity exercise. Spend less time sitting. Even light physical activity can be beneficial. Watch cholesterol and blood lipids Have your blood tested for lipids and cholesterol at 60 years of age, then have this test every 5 years. You may need to have your cholesterol levels checked more often if: Your lipid or cholesterol levels are high. You are older than 60 years of age. You are at high risk for heart disease. What should I know about cancer screening? Many types of cancers can be detected early and may often be prevented. Depending on your health history and family history, you may need to have cancer screening at various ages. This may include screening for: Colorectal cancer. Prostate cancer. Skin cancer. Lung  cancer. What should I know about heart disease, diabetes, and high blood pressure? Blood pressure and heart disease High blood pressure causes heart disease and increases the risk of stroke. This is more likely to develop in people who have high blood pressure readings or are overweight. Talk with your health care provider about your target blood pressure readings. Have your blood pressure checked: Every 3-5 years if you are 18-39 years of age. Every year if you are 40 years old or older. If you are between the ages of 65 and 75 and are a current or former smoker, ask your health care provider if you should have a one-time screening for abdominal aortic aneurysm (AAA). Diabetes Have regular diabetes screenings. This checks your fasting blood sugar level. Have the screening done: Once every three years after age 45 if you are at a normal weight and have a low risk for diabetes. More often and at a younger age if you are overweight or have a high risk for diabetes. What should I know about preventing infection? Hepatitis B If you have a higher risk for hepatitis B, you should be screened for this virus. Talk with your health care provider to find out if you are at risk for hepatitis B infection. Hepatitis C Blood testing is recommended for: Everyone born from 1945 through 1965. Anyone with known risk factors for hepatitis C. Sexually transmitted infections (STIs) You should be screened each year for STIs, including gonorrhea and chlamydia, if: You are sexually active and are younger than 60 years of age. You are older than 60 years of age and your   health care provider tells you that you are at risk for this type of infection. Your sexual activity has changed since you were last screened, and you are at increased risk for chlamydia or gonorrhea. Ask your health care provider if you are at risk. Ask your health care provider about whether you are at high risk for HIV. Your health care provider  may recommend a prescription medicine to help prevent HIV infection. If you choose to take medicine to prevent HIV, you should first get tested for HIV. You should then be tested every 3 months for as long as you are taking the medicine. Follow these instructions at home: Alcohol use Do not drink alcohol if your health care provider tells you not to drink. If you drink alcohol: Limit how much you have to 0-2 drinks a day. Know how much alcohol is in your drink. In the [place] one drink equals one 12 oz bottle of beer (355 mL), one 5 oz glass of wine (148 mL), or one 1 oz glass of hard liquor (44 mL). Lifestyle Do not use any products that contain nicotine or tobacco. These products include cigarettes, chewing tobacco, and vaping devices, such as e-cigarettes. If you need help quitting, ask your health care provider. Do not use street drugs. Do not share needles. Ask your health care provider for help if you need support or information about quitting drugs. General instructions Schedule regular health, dental, and eye exams. Stay current with your vaccines. Tell your health care provider if: You often feel depressed. You have ever been abused or do not feel safe at home. Summary Adopting a healthy lifestyle and getting preventive care are important in promoting health and wellness. Follow your health care provider's instructions about healthy diet, exercising, and getting tested or screened for diseases. Follow your health care provider's instructions on monitoring your cholesterol and blood pressure. This information is not intended to replace advice given to you by your health care provider. Make sure you discuss any questions you have with your health care provider. Document Revised: 04/05/2021 Document Reviewed: 04/05/2021 Elsevier Patient Education  2023 Elsevier Inc.  

## 2023-01-10 NOTE — Progress Notes (Unsigned)
Subjective:  Patient ID: [name], male    DOB: 05/25/1961  Age: 62 y.o. MRN: [id number]  CC: Annual Exam, Hypertension, and Cough   HPI MARCELLOUS VELTMAN presents for a CPX and f/up -----  He complains of a 43-monthhistory of fatigue and nonproductive cough.  He recently saw a urologist and testosterone has been prescribed.  He is active and denies dyspnea on exertion, chest pain, diaphoresis, or edema.  Outpatient Medications Prior to Visit  Medication Sig Dispense Refill   aspirin EC 81 MG tablet Take 1 tablet (81 mg total) by mouth daily. 90 tablet 1   fluticasone (FLONASE) 50 MCG/ACT nasal spray SPRAY 2 SPRAYS INTO EACH NOSTRIL EVERY DAY 48 mL 1   Multiple Vitamin (MULTI-VITAMIN) tablet Take by mouth.     mupirocin ointment (BACTROBAN) 2 % Apply 1 Application topically 2 (two) times daily. 22 g 0   benzonatate (TESSALON) 200 MG capsule Take 1 capsule (200 mg total) by mouth 2 (two) times daily as needed for cough. 20 capsule 0   lubiprostone (AMITIZA) 24 MCG capsule Take 1 capsule (24 mcg total) by mouth 2 (two) times daily with a meal. 180 capsule 1   No facility-administered medications prior to visit.    ROS Review of Systems  Constitutional:  Positive for fatigue. Negative for appetite change, chills, diaphoresis, fever and unexpected weight change.  HENT: Negative.    Respiratory:  Positive for cough. Negative for chest tightness, shortness of breath and wheezing.   Cardiovascular:  Negative for chest pain, palpitations and leg swelling.  Gastrointestinal:  Negative for abdominal pain, constipation, diarrhea, nausea and vomiting.  Genitourinary: Negative.  Negative for difficulty urinating and hematuria.  Musculoskeletal: Negative.   Skin: Negative.  Negative for color change and pallor.  Neurological:  Negative for dizziness, weakness, light-headedness and numbness.  Hematological:  Negative for adenopathy. Does not bruise/bleed easily.  Psychiatric/Behavioral:  Negative.      Objective:  BP 136/80 (BP Location: Right Arm, Patient Position: Sitting, Cuff Size: Large)   Pulse 75   Temp 98.6 F (37 C) (Oral)   Ht 6' (1.829 m)   Wt 241 lb (109.3 kg)   SpO2 97%   BMI 32.69 kg/m   BP Readings from Last 3 Encounters:  01/10/23 136/80  11/18/22 126/84  07/25/22 (!) 144/88    Wt Readings from Last 3 Encounters:  01/10/23 241 lb (109.3 kg)  11/18/22 240 lb (108.9 kg)  07/25/22 240 lb (108.9 kg)    Physical Exam Vitals reviewed.  HENT:     Nose: Nose normal.     Mouth/Throat:     Mouth: Mucous membranes are moist.  Eyes:     General: No scleral icterus.    Conjunctiva/sclera: Conjunctivae normal.  Cardiovascular:     Rate and Rhythm: Normal rate and regular rhythm.     Heart sounds: Normal heart sounds, S1 normal and S2 normal.     No friction rub. No gallop.     Comments: EKG- NSR, 66 bpm No LVH or Q waves Normal EKG Pulmonary:     Effort: Pulmonary effort is normal.     Breath sounds: No stridor. No wheezing, rhonchi or rales.  Abdominal:     General: Abdomen is flat.     Palpations: There is no mass.     Tenderness: There is no abdominal tenderness. There is no guarding.     Hernia: No hernia is present.  Musculoskeletal:     Cervical back:  Neck supple.     Right lower leg: No edema.     Left lower leg: No edema.  Lymphadenopathy:     Cervical: No cervical adenopathy.  Skin:    General: Skin is warm and dry.  Neurological:     General: No focal deficit present.     Mental Status: He is alert. Mental status is at baseline.  Psychiatric:        Mood and Affect: Mood normal.        Behavior: Behavior normal.     Lab Results  Component Value Date   WBC 3.9 (L) 11/18/2022   HGB 14.4 11/18/2022   HCT 43.9 11/18/2022   PLT 232.0 11/18/2022   GLUCOSE 90 01/10/2023   CHOL 168 01/10/2023   TRIG 124.0 01/10/2023   HDL 51.60 01/10/2023   LDLDIRECT 132.4 07/29/2008   LDLCALC 92 01/10/2023   ALT 18 01/10/2023   AST  15 01/10/2023   NA 139 01/10/2023   K 4.5 01/10/2023   CL 103 01/10/2023   CREATININE 1.24 01/10/2023   BUN 15 01/10/2023   CO2 31 01/10/2023   TSH 1.16 01/10/2023   PSA 2.9 01/04/2023   HGBA1C 6.1 01/10/2023   MICROALBUR 1.2 03/22/2012    [place] FNA BX THYROID 1ST LESION AFIRMA  Result Date: 07/29/2022 INDICATION: Indeterminate thyroid nodules. EXAM: ULTRASOUND GUIDED FINE NEEDLE ASPIRATION OF INDETERMINATE ISTHMUS THYROID NODULE ULTRASOUND GUIDED FINE NEEDLE ASPIRATION OF INDETERMINATE RIGHT THYROID NODULE COMPARISON:  Thyroid ultrasound 07/26/2022 MEDICATIONS: None COMPLICATIONS: None immediate. TECHNIQUE: Informed written consent was obtained from the patient after a discussion of the risks, benefits and alternatives to treatment. Questions regarding the procedure were encouraged and answered. A timeout was performed prior to the initiation of the procedure. Pre-procedural ultrasound scanning demonstrated unchanged size and appearance of the indeterminate nodules within the right thyroid lobe and isthmus. The procedure was planned. The neck was prepped in the usual sterile fashion, and a sterile drape was applied covering the operative field. A timeout was performed prior to the initiation of the procedure. Local anesthesia was provided with 1% lidocaine. Under direct ultrasound guidance, 5 fine-needle aspirations were performed in the isthmus nodule with 25 gauge needles. Two specimens were collected for Afirma. Multiple ultrasound images were saved for procedural documentation purposes. The samples were prepared and submitted to pathology. Under direct ultrasound guidance, 5 fine-needle aspirations were performed in the right thyroid nodule with 25 gauge needles. Two specimens were collected for Afirma. Multiple ultrasound images were saved for procedural documentation purposes. The samples were prepared and submitted to pathology. Limited post procedural scanning was negative for hematoma or  additional complication. Dressings were placed. The patient tolerated the above procedures procedure well without immediate postprocedural complication. FINDINGS: Nodule reference number based on prior diagnostic ultrasound: 1 Maximum size: 3.2 cm Location: Isthmus; Inferior ACR TI-RADS risk category: TR4 (4-6 points) Reason for biopsy: meets ACR TI-RADS criteria _________________________________________________________ Nodule reference number based on prior diagnostic ultrasound: 2 Maximum size: 4.1 cm Location: Right; Inferior ACR TI-RADS risk category: TR3 (3 points) Reason for biopsy: meets ACR TI-RADS criteria Ultrasound imaging confirms appropriate placement of the needles within the thyroid nodule. IMPRESSION: 1. Technically successful ultrasound guided fine needle aspiration of the isthmus nodule. 2. Technically successful ultrasound guided fine needle aspiration of the right inferior thyroid nodule. Electronically Signed   By: Markus Daft M.D.   On: 07/29/2022 11:54   [place] FNA BX THYROID 1ST LESION AFIRMA  Result Date: 07/29/2022 INDICATION: Indeterminate thyroid nodules.  EXAM: ULTRASOUND GUIDED FINE NEEDLE ASPIRATION OF INDETERMINATE ISTHMUS THYROID NODULE ULTRASOUND GUIDED FINE NEEDLE ASPIRATION OF INDETERMINATE RIGHT THYROID NODULE COMPARISON:  Thyroid ultrasound 07/26/2022 MEDICATIONS: None COMPLICATIONS: None immediate. TECHNIQUE: Informed written consent was obtained from the patient after a discussion of the risks, benefits and alternatives to treatment. Questions regarding the procedure were encouraged and answered. A timeout was performed prior to the initiation of the procedure. Pre-procedural ultrasound scanning demonstrated unchanged size and appearance of the indeterminate nodules within the right thyroid lobe and isthmus. The procedure was planned. The neck was prepped in the usual sterile fashion, and a sterile drape was applied covering the operative field. A timeout was performed prior to  the initiation of the procedure. Local anesthesia was provided with 1% lidocaine. Under direct ultrasound guidance, 5 fine-needle aspirations were performed in the isthmus nodule with 25 gauge needles. Two specimens were collected for Afirma. Multiple ultrasound images were saved for procedural documentation purposes. The samples were prepared and submitted to pathology. Under direct ultrasound guidance, 5 fine-needle aspirations were performed in the right thyroid nodule with 25 gauge needles. Two specimens were collected for Afirma. Multiple ultrasound images were saved for procedural documentation purposes. The samples were prepared and submitted to pathology. Limited post procedural scanning was negative for hematoma or additional complication. Dressings were placed. The patient tolerated the above procedures procedure well without immediate postprocedural complication. FINDINGS: Nodule reference number based on prior diagnostic ultrasound: 1 Maximum size: 3.2 cm Location: Isthmus; Inferior ACR TI-RADS risk category: TR4 (4-6 points) Reason for biopsy: meets ACR TI-RADS criteria _________________________________________________________ Nodule reference number based on prior diagnostic ultrasound: 2 Maximum size: 4.1 cm Location: Right; Inferior ACR TI-RADS risk category: TR3 (3 points) Reason for biopsy: meets ACR TI-RADS criteria Ultrasound imaging confirms appropriate placement of the needles within the thyroid nodule. IMPRESSION: 1. Technically successful ultrasound guided fine needle aspiration of the isthmus nodule. 2. Technically successful ultrasound guided fine needle aspiration of the right inferior thyroid nodule. Electronically Signed   By: Markus Daft M.D.   On: 07/29/2022 11:54    DG Chest 2 View  Result Date: 01/10/2023 CLINICAL DATA:  Provided history: Cough.  Former smoker. EXAM: CHEST - 2 VIEW COMPARISON:  Prior chest radiographs 11/18/2022 and earlier. FINDINGS: Heart size within normal  limits. Mild elevation of the left hemidiaphragm. No appreciable airspace consolidation. No evidence of pleural effusion or pneumothorax. No acute bony abnormality identified. Degenerative changes of the spine. IMPRESSION: No appreciable airspace consolidation. Mild elevation of the left hemidiaphragm. Electronically Signed   By: Kellie Simmering D.O.   On: 01/10/2023 16:17     Assessment & Plan:   Mahendra was seen today for annual exam, hypertension and cough.  Diagnoses and all orders for this visit:  Subacute cough- Chest x-ray is negative for mass or infiltrate. -     DG Chest 2 View; Future  Primary hypertension- His blood pressure is well-controlled. -     Basic metabolic panel; Future -     TSH; Future -     Hepatic function panel; Future -     Hepatic function panel -     TSH -     Basic metabolic panel -     EKG [id number]  TOBACCO ABUSE -     Ambulatory Referral for Lung Cancer Scre  Hyperlipidemia with target LDL less than 70- His ASCVD risk score is 14%.  Will start a statin for cardiovascular risk reduction. -     Lipid panel; Future -  TSH; Future -     Hepatic function panel; Future -     Hepatic function panel -     TSH -     Lipid panel  Prediabetes- His A1c is at 6.1%. -     Basic metabolic panel; Future -     Hemoglobin A1c; Future -     Hemoglobin A1c -     Basic metabolic panel  Encounter for general adult medical examination with abnormal findings- Exam completed, labs reviewed, vaccines reviewed and updated, cancer screenings addressed, patient education was given.  Screen for colon cancer -     Ambulatory referral to Gastroenterology  Flu vaccine need -     Flu Vaccine QUAD 36moIM (Fluarix, Fluzone & Alfiuria Quad PF)   I have discontinued BAlvester ChouT. Marinaccio's lubiprostone and benzonatate. I am also having him maintain his Multi-Vitamin, aspirin EC, mupirocin ointment, and fluticasone.  No orders of the defined types were placed in this  encounter.    Follow-up: Return in about 6 months (around 07/11/2023).  TScarlette Calico MD

## 2023-01-11 ENCOUNTER — Encounter: Payer: Self-pay | Admitting: Internal Medicine

## 2023-01-12 MED ORDER — ROSUVASTATIN CALCIUM 10 MG PO TABS: 10.0000 mg | ORAL_TABLET | Freq: Every day | ORAL | 1 refills | Status: DC

## 2023-02-20 ENCOUNTER — Ambulatory Visit (AMBULATORY_SURGERY_CENTER): Payer: Commercial Managed Care - PPO | Admitting: *Deleted

## 2023-02-20 ENCOUNTER — Telehealth: Payer: Self-pay | Admitting: *Deleted

## 2023-02-20 VITALS — Ht 72.0 in | Wt 230.0 lb

## 2023-02-20 DIAGNOSIS — Z86010 Personal history of colonic polyps: Secondary | ICD-10-CM

## 2023-02-20 MED ORDER — NA SULFATE-K SULFATE-MG SULF 17.5-3.13-1.6 GM/177ML PO SOLN: 1.0000 | Freq: Once | ORAL | 0 refills | Status: AC

## 2023-02-20 NOTE — Progress Notes (Signed)
Pt's previsit is done over the phone and all paperwork (prep instructions) sent to patient. Pt's name and DOB verified at the beginning of the previsit. Pt denies any difficulty with ambulating.  No egg or soy allergy known to patient  No issues known to pt with past sedation with any surgeries or procedures Patient denies ever being intubated No FH of Malignant Hyperthermia Pt is not on diet pills Pt is not on  home 02  Pt is not on blood thinners  Pt denies issues with constipation  Pt is not on dialysis Pt denies any upcoming cardiac testing Pt encouraged to use to use Singlecare or Goodrx to reduce cost  Patient's chart reviewed by Osvaldo Angst CNRA prior to previsit and patient appropriate for the [place].  Previsit completed and red dot placed by patient's name on their procedure day (on provider's schedule).  . Visit by phone Pt states her weight is  Instructions reviewed with pt and pt states understanding. Instructed to review again prior to procedure. Pt states they will.  Instructions sent by mail with coupon and by my chart

## 2023-02-20 NOTE — Telephone Encounter (Signed)
Attempt t reach pt for pre-visit appt. LM with call back # and instructed pt to call by 5 pm today and reschedule pre-visit or the procedure will have to be canceled

## 2023-02-22 ENCOUNTER — Ambulatory Visit: Payer: Commercial Managed Care - PPO | Admitting: Internal Medicine

## 2023-02-22 ENCOUNTER — Encounter: Payer: Self-pay | Admitting: Internal Medicine

## 2023-02-22 ENCOUNTER — Encounter: Payer: Self-pay | Admitting: Nutrition

## 2023-02-22 VITALS — BP 154/91 | HR 65 | Temp 98.5°F | Ht 72.0 in | Wt 243.2 lb

## 2023-02-22 DIAGNOSIS — M65311 Trigger thumb, right thumb: Secondary | ICD-10-CM | POA: Insufficient documentation

## 2023-02-22 DIAGNOSIS — J320 Chronic maxillary sinusitis: Secondary | ICD-10-CM

## 2023-02-22 DIAGNOSIS — J069 Acute upper respiratory infection, unspecified: Secondary | ICD-10-CM

## 2023-02-22 DIAGNOSIS — M19041 Primary osteoarthritis, right hand: Secondary | ICD-10-CM

## 2023-02-22 LAB — POCT INFLUENZA A/B
INFLUENZA B:: 2
Influenza A, POC: NEGATIVE

## 2023-02-22 LAB — POC COVID19 BINAXNOW: SARS Coronavirus 2 Ag: NEGATIVE

## 2023-02-22 LAB — POCT RAPID STREP A (OFFICE): Rapid Strep A Screen: NEGATIVE

## 2023-02-22 MED ORDER — LORATADINE 10 MG PO TABS: 10.0000 mg | ORAL_TABLET | Freq: Every day | ORAL | 11 refills | Status: DC

## 2023-02-22 MED ORDER — FLUTICASONE PROPIONATE 50 MCG/ACT NA SUSP: 2.0000 | Freq: Every day | NASAL | 6 refills | Status: DC

## 2023-02-22 MED ORDER — PSEUDOEPHEDRINE HCL ER 120 MG PO TB12: 120.0000 mg | ORAL_TABLET | Freq: Two times a day (BID) | ORAL | 0 refills | Status: DC

## 2023-02-22 MED ORDER — DOXYCYCLINE MONOHYDRATE 100 MG PO TABS: 100.0000 mg | ORAL_TABLET | Freq: Two times a day (BID) | ORAL | 0 refills | Status: DC

## 2023-02-22 MED ORDER — SIMPLY SALINE 0.9 % NA AERS: 2.0000 | INHALATION_SPRAY | NASAL | 11 refills | Status: DC

## 2023-02-22 NOTE — Patient Instructions (Addendum)
It was a pleasure seeing you today!  Your health and satisfaction are my top priorities. If you believe your experience today was worthy of a 5-star rating, I'd be grateful for your feedback! Loralee Pacas, MD   Any requested lab visits should be scheduled as appointments too  If you are not doing well:  Return to the office sooner Please bring all your medicine bottles to each appointment If your condition begins to worsen or become severe:  go to the emergency room or even call 911    []   (Optional):  Review your clinical notes on MyChart after they are completed.     Today's draft of the physician documented plan for today's visit: (final revisions will be visible on MyChart chart later) Upper respiratory tract infection, unspecified type -     POC COVID-19 BinaxNow -     POCT Influenza A/B -     POCT rapid strep A  Trigger thumb of right hand -     Ambulatory referral to Hand Surgery  Osteoarthritis of right hand, unspecified osteoarthritis type  Chronic maxillary sinusitis -     Fluticasone Propionate; Place 2 sprays into both nostrils daily.  Dispense: 16 g; Refill: 6 -     Simply Saline; Place 2 each into the nose as directed. Use nightly for sinus hygiene long-term.  Can also be used as many times daily as desired to assist with clearing congested sinuses.  Dispense: 127 mL; Refill: 11 -     Loratadine; Take 1 tablet (10 mg total) by mouth daily.  Dispense: 30 tablet; Refill: 11 -     Pseudoephedrine HCl ER; Take 1 tablet (120 mg total) by mouth 2 (two) times daily.  Dispense: 20 tablet; Refill: 0 -     Doxycycline Monohydrate; Take 1 tablet (100 mg total) by mouth 2 (two) times daily.  Dispense: 20 tablet; Refill: 0    QUESTIONS & CONCERNS: CLINICAL: please contact us via phone [phone] OR MyChart messaging  LAB & IMAGING:   We will call you if the results are significantly abnormal or you don't use MyChart.  Most normal results will be posted to MyChart immediately  and have a clinical review message by Dr. Randol Kern posted within 2-3 business days.   If you have not heard from [place] regarding the results in 2 weeks OR if you need priority reporting, please contact this office. MYCHART:  The fastest way to get your results and easiest way to stay in touch with [place] is by activating your My Chart account. Instructions are located on the last page of this paperwork.  BILLING: xray and lab orders are billed from separate companies and questions./concerns should be directed to the [facility].  For visit charges please discuss with our administrative services COMPLAINTS:  please let Dr. Randol Kern know or see the Trail, by asking at the front desk: we want you to be satisfied with every experience and we would be grateful for the opportunity to address any problems
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
## 2023-03-03 ENCOUNTER — Encounter: Payer: Self-pay | Admitting: Internal Medicine

## 2023-03-03 ENCOUNTER — Ambulatory Visit (AMBULATORY_SURGERY_CENTER): Payer: Commercial Managed Care - PPO | Admitting: Internal Medicine

## 2023-03-03 VITALS — BP 111/64 | HR 66 | Temp 98.4°F | Resp 22 | Ht 72.0 in | Wt 230.0 lb

## 2023-03-03 DIAGNOSIS — Z09 Encounter for follow-up examination after completed treatment for conditions other than malignant neoplasm: Secondary | ICD-10-CM

## 2023-03-03 DIAGNOSIS — Z86010 Personal history of colonic polyps: Secondary | ICD-10-CM | POA: Diagnosis not present

## 2023-03-03 MED ORDER — LINACLOTIDE 290 MCG PO CAPS
290.0000 ug | ORAL_CAPSULE | Freq: Every day | ORAL | 2 refills | Status: DC
Start: 2023-03-03 — End: 2024-01-18

## 2023-03-03 MED ORDER — SODIUM CHLORIDE 0.9 % IV SOLN: 500.0000 mL | Freq: Once | INTRAVENOUS | Status: DC

## 2023-03-03 NOTE — Progress Notes (Signed)
GASTROENTEROLOGY PROCEDURE H&P NOTE   Primary Care Physician: [name], Scott L, MD    Reason for Procedure:  History of colon polyps  Plan:    colonoscopy  Patient is appropriate for endoscopic procedure(s) in the ambulatory ([facility]) setting.  The nature of the procedure, as well as the risks, benefits, and alternatives were carefully and thoroughly reviewed with the patient. Ample time for discussion and questions allowed. The patient understood, was satisfied, and agreed to proceed.     HPI: [name] is a 62 y.o. male who presents for surveillance colonoscopy.  Medical history as below.  Tolerated the prep.  No recent chest pain or shortness of breath.  No abdominal pain today.  Past Medical History:  Diagnosis Date   BPH (benign prostatic hyperplasia)    Cataract    Chronic rhinitis    Coronary atherosclerosis of unspecified type of vessel, native or graft    Dysmetabolic syndrome X    Erectile dysfunction    Hypertrophy of prostate with urinary obstruction and other lower urinary tract symptoms (LUTS)    Obstructive sleep apnea (adult) (pediatric)    no CPAP, has lost weight   Prediabetes    Primary hypertension 11/03/2021   Problems related to high-risk sexual behavior    Pure hypercholesterolemia    Sickle cell trait    Sleep apnea    Tobacco use disorder     Past Surgical History:  Procedure Laterality Date   COSMETIC SURGERY     HERNIA REPAIR     UHR  AT [facility]  <5 YRS AGO    INCISION AND DRAINAGE ABSCESS / HEMATOMA OF BURSA / KNEE / THIGH     I&D of complex abscess,left axilla. I&D of large infected pilonidal abscess   KNEE ARTHROSCOPY WITH MENISCAL REPAIR Right 02/08/2022   Procedure: RIGHT KNEE PARTIAL MEDIAL MENISCECTOMY;  Surgeon: Ernestina ColumbiaLooney, [place], MD;  Location: [facility];  Service: Orthopedics;  Laterality: Right;   LAPAROSCOPIC PARTIAL COLECTOMY N/A 03/10/2015   Procedure: LAPAROSCOPIC ASSISTED RIGHT COLECTOMY;  Surgeon: Emelia LoronMatthew  Wakefield, MD;  Location: [facility] OR;  Service: General;  Laterality: N/A;   MASS EXCISION N/A 10/19/2015   Procedure: EXCISION BACK MASS;  Surgeon: Emelia LoronMatthew Wakefield, MD;  Location: [facility];  Service: General;  Laterality: N/A;   PTCA  2010    Prior to Admission medications   Medication Sig Start Date End Date Taking? Authorizing Provider  aspirin EC 81 MG tablet Take 1 tablet (81 mg total) by mouth daily. 11/03/21  Yes [name], Scott L, MD  doxycycline (AVIDOXY) 100 MG tablet Take 1 tablet (100 mg total) by mouth 2 (two) times daily. 02/22/23  Yes Lula OlszewskiMorrison, Ryan G, MD  fluticasone [facility](FLONASE) 50 MCG/ACT nasal spray SPRAY 2 SPRAYS INTO EACH NOSTRIL EVERY DAY 12/12/22  Yes Henson, Vickie L, NP-C  fluticasone (FLONASE) 50 MCG/ACT nasal spray Place 2 sprays into both nostrils daily. 02/22/23  Yes Lula OlszewskiMorrison, Ryan G, MD  prednisoLONE acetate (PRED FORTE) 1 % ophthalmic suspension Place 1 drop into the left eye 4 (four) times daily. 02/10/23  Yes [provider]  loratadine (CLARITIN) 10 MG tablet Take 1 tablet (10 mg total) by mouth daily. Patient not taking: Reported on 03/03/2023 02/22/23   Lula OlszewskiMorrison, Ryan G, MD  Multiple Vitamin (MULTI-VITAMIN) tablet Take by mouth. Patient not taking: Reported on 03/03/2023    [provider]  mupirocin ointment (BACTROBAN) 2 % Apply 1 Application topically 2 (two) times daily. Patient not taking: Reported  on 03/03/2023 07/25/22   Avanell ShackletonHenson, Vickie L, NP-C  pseudoephedrine (SUDAFED 12 HOUR) 120 MG 12 hr tablet Take 1 tablet (120 mg total) by mouth 2 (two) times daily. Patient not taking: Reported on 03/03/2023 02/22/23   Lula OlszewskiMorrison, Ryan G, MD  rosuvastatin (CRESTOR) 10 MG tablet Take 1 tablet (10 mg total) by mouth daily. Patient not taking: Reported on 03/03/2023 01/12/23   [name], Scott L, MD  Saline (SIMPLY SALINE) 0.9 % AERS Place 2 each into the nose as directed. Use nightly for sinus hygiene long-term.  Can also be used as many times daily as desired  to assist with clearing congested sinuses. Patient not taking: Reported on 03/03/2023 02/22/23   Lula OlszewskiMorrison, Ryan G, MD  tadalafil (CIALIS) 5 MG tablet Take 5 mg by mouth daily. Patient not taking: Reported on 03/03/2023 02/01/23   [provider]    Current Outpatient Medications  Medication Sig Dispense Refill   aspirin EC 81 MG tablet Take 1 tablet (81 mg total) by mouth daily. 90 tablet 1   doxycycline (AVIDOXY) 100 MG tablet Take 1 tablet (100 mg total) by mouth 2 (two) times daily. 20 tablet 0   fluticasone (FLONASE) 50 MCG/ACT nasal spray SPRAY 2 SPRAYS INTO EACH NOSTRIL EVERY DAY 48 mL 1   fluticasone (FLONASE) 50 MCG/ACT nasal spray Place 2 sprays into both nostrils daily. 16 g 6   prednisoLONE acetate (PRED FORTE) 1 % ophthalmic suspension Place 1 drop into the left eye 4 (four) times daily.     loratadine (CLARITIN) 10 MG tablet Take 1 tablet (10 mg total) by mouth daily. (Patient not taking: Reported on 03/03/2023) 30 tablet 11   Multiple Vitamin (MULTI-VITAMIN) tablet Take by mouth. (Patient not taking: Reported on 03/03/2023)     mupirocin ointment (BACTROBAN) 2 % Apply 1 Application topically 2 (two) times daily. (Patient not taking: Reported on 03/03/2023) 22 g 0   pseudoephedrine (SUDAFED 12 HOUR) 120 MG 12 hr tablet Take 1 tablet (120 mg total) by mouth 2 (two) times daily. (Patient not taking: Reported on 03/03/2023) 20 tablet 0   rosuvastatin (CRESTOR) 10 MG tablet Take 1 tablet (10 mg total) by mouth daily. (Patient not taking: Reported on 03/03/2023) 90 tablet 1   Saline (SIMPLY SALINE) 0.9 % AERS Place 2 each into the nose as directed. Use nightly for sinus hygiene long-term.  Can also be used as many times daily as desired to assist with clearing congested sinuses. (Patient not taking: Reported on 03/03/2023) 127 mL 11   tadalafil (CIALIS) 5 MG tablet Take 5 mg by mouth daily. (Patient not taking: Reported on 03/03/2023)     Current Facility-Administered Medications  Medication Dose  Route Frequency Provider Last Rate Last Admin   0.9 %  sodium chloride infusion  500 mL Intravenous Once Alfretta Pinch, Carie CaddyJay M, MD        Allergies as of 03/03/2023 - Review Complete 03/03/2023  Allergen Reaction Noted   Penicillins Hives and Itching 07/08/2010    Family History  Problem Relation Age of Onset   Breast cancer Mother    Colon cancer Maternal Grandmother    Stroke Maternal Grandmother        Over 6965   Colon cancer Maternal Grandfather    Prostate cancer Maternal Grandfather    Colon cancer Other        1st degree relative<60   Heart attack Father        Over 2155   Esophageal cancer Neg Hx  Stomach cancer Neg Hx    Rectal cancer Neg Hx     Social History   Socioeconomic History   Marital status: Divorced    Spouse name: Not on file   Number of children: 1   Years of education: Not on file   Highest education level: Not on file  Occupational History   Not on file  Tobacco Use   Smoking status: Some Days    Types: Cigars   Smokeless tobacco: Never  Vaping Use   Vaping Use: Never used  Substance and Sexual Activity   Alcohol use: Yes    Comment: pt states rare   Drug use: No   Sexual activity: Yes    Birth control/protection: Condom  Other Topics Concern   Not on file  Social History Narrative   Caffeine: 1 2- cups,  Education: grad HS, [employer] (does Paramedic) Multimedia programmer. Divorced, one child,  3 grandkids.    Social Determinants of Health   Financial Resource Strain: Not on file  Food Insecurity: Not on file  Transportation Needs: Not on file  Physical Activity: Not on file  Stress: Not on file  Social Connections: Not on file  Intimate Partner Violence: Not on file    Physical Exam: Vital signs in last 24 hours: @BP  136/86   Pulse 75   Temp 98.4 F (36.9 C) (Skin)   Ht 6' (1.829 m)   Wt 230 lb (104.3 kg)   SpO2 99%   BMI 31.19 kg/m  GEN: NAD EYE: Sclerae anicteric ENT: MMM CV: Non-tachycardic Pulm: CTA b/[name] GI: Soft,  NT/ND NEURO:  Alert & Oriented x 3   Erick Blinks, MD [facility]  03/03/2023 1:48 PM

## 2023-03-03 NOTE — Patient Instructions (Addendum)
Handout on diverticulosis given to patient. Resume previous diet and continue present medications  Pick up prescription for Linzess 290 mcg daily for chronic constipation from [facility] off [address] - if not effective please call the office  Repeat colonoscopy in 10 years for surveillance    YOU HAD AN ENDOSCOPIC PROCEDURE TODAY AT THE [facility]:   Refer to the procedure report that was given to you for any specific questions about what was found during the examination.  If the procedure report does not answer your questions, please call your gastroenterologist to clarify.  If you requested that your care partner not be given the details of your procedure findings, then the procedure report has been included in a sealed envelope for you to review at your convenience later.  YOU SHOULD EXPECT: Some feelings of bloating in the abdomen. Passage of more gas than usual.  Walking can help get rid of the air that was put into your GI tract during the procedure and reduce the bloating. If you had a lower endoscopy (such as a colonoscopy or flexible sigmoidoscopy) you may notice spotting of blood in your stool or on the toilet paper. If you underwent a bowel prep for your procedure, you may not have a normal bowel movement for a few days.  Please Note:  You might notice some irritation and congestion in your nose or some drainage.  This is from the oxygen used during your procedure.  There is no need for concern and it should clear up in a day or so.  SYMPTOMS TO REPORT IMMEDIATELY:  Following lower endoscopy (colonoscopy or flexible sigmoidoscopy):  Excessive amounts of blood in the stool  Significant tenderness or worsening of abdominal pains  Swelling of the abdomen that is new, acute  Fever of 100F or higher   For urgent or emergent issues, a gastroenterologist can be reached at any hour by calling [phone]. Do not use MyChart messaging for urgent concerns.    DIET:   We do recommend a small meal at first, but then you may proceed to your regular diet.  Drink plenty of fluids but you should avoid alcoholic beverages for 24 hours.  ACTIVITY:  You should plan to take it easy for the rest of today and you should NOT DRIVE or use heavy machinery until tomorrow (because of the sedation medicines used during the test).    FOLLOW UP: Our staff will call the number listed on your records the next business day following your procedure.  We will call around 7:15- 8:00 am to check on you and address any questions or concerns that you may have regarding the information given to you following your procedure. If we do not reach you, we will leave a message.     If any biopsies were taken you will be contacted by phone or by letter within the next 1-3 weeks.  Please call us at [phone] if you have not heard about the biopsies in 3 weeks.    SIGNATURES/CONFIDENTIALITY: You and/or your care partner have signed paperwork which will be entered into your electronic medical record.  These signatures attest to the fact that that the information above on your After Visit Summary has been reviewed and is understood.  Full responsibility of the confidentiality of this discharge information lies with you and/or your care-partner.

## 2023-03-03 NOTE — Progress Notes (Signed)
Sedate, gd SR, tolerated procedure well, VSS, report to RN 

## 2023-03-03 NOTE — Progress Notes (Signed)
Pt's states no medical or surgical changes since previsit or office visit. 

## 2023-03-03 NOTE — Op Note (Signed)
[facility] Patient Name: Scott PiggBarry Bowlby Procedure Date: 03/03/2023 1:54 PM MRN: [id number] Endoscopist: Beverley FiedlerJay M Diantha Paxson , MD, [phone] Age: [age] Referring MD:  Date of Birth: 07/14/1961 Gender: Male Account #: [id number] Procedure:                Colonoscopy Indications:              High risk colon cancer surveillance: Personal                            history of non-advanced adenoma, Last colonoscopy:                            February 2018 (no polyps), adenoma in 2016 (< 1 cm) Medicines:                Monitored Anesthesia Care Procedure:                Pre-Anesthesia Assessment:                           - Prior to the procedure, a History and Physical                            was performed, and patient medications and                            allergies were reviewed. The patient's tolerance of                            previous anesthesia was also reviewed. The risks                            and benefits of the procedure and the sedation                            options and risks were discussed with the patient.                            All questions were answered, and informed consent                            was obtained. Prior Anticoagulants: The patient has                            taken no anticoagulant or antiplatelet agents. ASA                            Grade Assessment: III - A patient with severe                            systemic disease. After reviewing the risks and                            benefits, the patient was deemed in satisfactory  condition to undergo the procedure.                           After obtaining informed consent, the colonoscope                            was passed under direct vision. Throughout the                            procedure, the patient's blood pressure, pulse, and                            oxygen saturations were monitored continuously. The                            CF [id number]  #[id number] was introduced through the anus                            and advanced to the terminal ileum. The colonoscopy                            was performed without difficulty. The patient                            tolerated the procedure well. The quality of the                            bowel preparation was good. The terminal ileum,                            ileocecal valve, appendiceal orifice, and rectum                            were photographed. Scope In: 2:00:41 PM Scope Out: 2:23:02 PM Scope Withdrawal Time: 0 hours 17 minutes 4 seconds  Total Procedure Duration: 0 hours 22 minutes 21 seconds  Findings:                 The digital rectal exam was normal.                           There was evidence of a prior end-to-side                            colo-colonic anastomosis in the ascending colon.                            This was patent and was characterized by healthy                            appearing mucosa. The anastomosis was traversed.                           The neo-terminal ileum appeared normal.  Multiple large-mouthed, medium-mouthed and                            small-mouthed diverticula were found in the sigmoid                            colon, descending colon, transverse colon and                            ascending colon.                           The exam was otherwise without abnormality on                            direct and retroflexion views. Complications:            No immediate complications. Estimated Blood Loss:     Estimated blood loss: none. Impression:               - Patent end-to-side colo-colonic anastomosis,                            characterized by healthy appearing mucosa.                           - The examined portion of the ileum was normal.                           - Moderate diverticulosis in the sigmoid colon, in                            the descending colon, in the transverse colon and                             in the ascending colon.                           - The examination was otherwise normal on direct                            and retroflexion views.                           - No specimens collected. Recommendation:           - Patient has a contact number available for                            emergencies. The signs and symptoms of potential                            delayed complications were discussed with the                            patient. Return to normal activities tomorrow.  Written discharge instructions were provided to the                            patient.                           - Resume previous diet.                           - Continue present medications.                           - Begin Linzess 290 mcg daily for chronic                            constipation. If not effective please call my                            office.                           - Repeat colonoscopy in 10 years for surveillance. Beverley Fiedler, MD 03/03/2023 2:28:40 PM This report has been signed electronically.

## 2023-03-06 ENCOUNTER — Telehealth: Payer: Self-pay

## 2023-03-06 NOTE — Telephone Encounter (Signed)
  Follow up Call-     03/03/2023    1:24 PM  Call back number  Post procedure Call Back phone  # [phone]  Permission to leave phone message Yes     Patient questions:  Do you have a fever, pain , or abdominal swelling? No. Pain Score  0 *  Have you tolerated food without any problems? Yes.    Have you been able to return to your normal activities? Yes.    Do you have any questions about your discharge instructions: Diet   No. Medications  No. Follow up visit  No.  Do you have questions or concerns about your Care? No.  Actions: * If pain score is 4 or above: No action needed, pain <4.

## 2023-03-09 ENCOUNTER — Telehealth: Payer: Self-pay | Admitting: Pharmacy Technician

## 2023-03-09 NOTE — Telephone Encounter (Signed)
Patient Advocate Encounter  Received notification from [facility] that prior authorization for LINZESS is required.   PA submitted on 4.11.24 Key [id number] Status is pending

## 2023-03-09 NOTE — Telephone Encounter (Signed)
PA has been APPROVED. Approval letter has been attached in patients documents. 

## 2023-03-13 ENCOUNTER — Encounter: Payer: Self-pay | Admitting: *Deleted

## 2023-03-21 ENCOUNTER — Other Ambulatory Visit: Payer: Self-pay | Admitting: Internal Medicine

## 2023-03-21 DIAGNOSIS — Z86010 Personal history of colonic polyps: Secondary | ICD-10-CM

## 2023-04-03 ENCOUNTER — Encounter: Payer: Self-pay | Admitting: Family Medicine

## 2023-04-03 ENCOUNTER — Ambulatory Visit (INDEPENDENT_AMBULATORY_CARE_PROVIDER_SITE_OTHER): Payer: Commercial Managed Care - PPO

## 2023-04-03 ENCOUNTER — Ambulatory Visit: Payer: Commercial Managed Care - PPO | Admitting: Family Medicine

## 2023-04-03 VITALS — BP 138/84 | HR 80 | Temp 98.4°F | Resp 20 | Ht 72.0 in | Wt 237.0 lb

## 2023-04-03 DIAGNOSIS — M545 Low back pain, unspecified: Secondary | ICD-10-CM | POA: Diagnosis not present

## 2023-04-03 MED ORDER — PREDNISONE 20 MG PO TABS
40.0000 mg | ORAL_TABLET | Freq: Every day | ORAL | 0 refills | Status: AC
Start: 2023-04-03 — End: 2023-04-08

## 2023-04-03 MED ORDER — METHOCARBAMOL 500 MG PO TABS
500.0000 mg | ORAL_TABLET | Freq: Three times a day (TID) | ORAL | 0 refills | Status: DC | PRN
Start: 2023-04-03 — End: 2023-11-01

## 2023-04-03 MED ORDER — HYDROCODONE-ACETAMINOPHEN 5-325 MG PO TABS
1.0000 | ORAL_TABLET | Freq: Four times a day (QID) | ORAL | 0 refills | Status: AC | PRN
Start: 2023-04-03 — End: 2023-04-08

## 2023-04-03 NOTE — Progress Notes (Signed)
Assessment & Plan:  1. Acute bilateral low back pain without sciatica Note provided for patient to be out of work the remainder of the week.  - DG Lumbar Spine 2-3 Views; Future - predniSONE (DELTASONE) 20 MG tablet; Take 2 tablets (40 mg total) by mouth daily for 5 days.  Dispense: 10 tablet; Refill: 0 - methocarbamol (ROBAXIN) 500 MG tablet; Take 1 tablet (500 mg total) by mouth every 8 (eight) hours as needed for muscle spasms.  Dispense: 30 tablet; Refill: 0 - HYDROcodone-acetaminophen (NORCO) 5-325 MG tablet; Take 1 tablet by mouth every 6 (six) hours as needed for up to 5 days for moderate pain.  Dispense: 20 tablet; Refill: 0   Follow up plan: Return if symptoms worsen or fail to improve.  Deliah Boston, MSN, APRN, FNP-C  Subjective:  HPI: [name] is a 61 y.o. male presenting on 04/03/2023 for Back Pain (Lower back pain x 2 weeks - lifted some very heavy weights on an incline )  Patient reports low back pain that started two weeks ago when he was lifting heavy weights on an incline. Pain worse the past few days; unable to sit, stand, walk, sleep, etc. 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 He has applied ice and heat. He has been doing low intensity exercising like walking. He has soaked in Epsom salt baths. Ibuprofen and pain medication have not been helpful.    ROS: Negative unless specifically indicated above in HPI.   Relevant past medical history reviewed and updated as indicated.   Allergies and medications reviewed and updated.   Current Outpatient Medications:    aspirin EC 81 MG tablet, Take 1 tablet (81 mg total) by mouth daily., Disp: 90 tablet, Rfl: 1   fluticasone (FLONASE) 50 MCG/ACT nasal spray, SPRAY 2 SPRAYS INTO EACH NOSTRIL EVERY DAY, Disp: 48 mL, Rfl: 1   linaclotide (LINZESS) 290 MCG CAPS capsule, Take 1 capsule (290 mcg total) by mouth daily before breakfast., Disp: 30  capsule, Rfl: 2   Multiple Vitamin (MULTI-VITAMIN) tablet, Take by mouth., Disp: , Rfl:    prednisoLONE acetate (PRED FORTE) 1 % ophthalmic suspension, Place 1 drop into the left eye 4 (four) times daily., Disp: , Rfl:    rosuvastatin (CRESTOR) 10 MG tablet, Take 1 tablet (10 mg total) by mouth daily., Disp: 90 tablet, Rfl: 1   tadalafil (CIALIS) 5 MG tablet, Take 5 mg by mouth daily., Disp: , Rfl:    doxycycline (AVIDOXY) 100 MG tablet, Take 1 tablet (100 mg total) by mouth 2 (two) times daily., Disp: 20 tablet, Rfl: 0   loratadine (CLARITIN) 10 MG tablet, Take 1 tablet (10 mg total) by mouth daily. (Patient not taking: Reported on 04/03/2023), Disp: 30 tablet, Rfl: 11   mupirocin ointment (BACTROBAN) 2 %, Apply 1 Application topically 2 (two) times daily. (Patient not taking: Reported on 03/03/2023), Disp: 22 g, Rfl: 0  Current Facility-Administered Medications:    0.9 %  sodium chloride infusion, 500 mL, Intravenous, Once, Pyrtle, Carie Caddy, MD  Allergies  Allergen Reactions   Penicillins Hives and Itching    Tolerated ancef without incident    Objective:   BP 138/84   Pulse 80   Temp 98.4 F (36.9 C)   Resp 20   Ht 6' (1.829 m)   Wt 237 lb (107.5 kg)   BMI 32.14 kg/m    Physical Exam  Vitals reviewed.  Constitutional:      General: He is not in acute distress.    Appearance: Normal appearance. He is not ill-appearing, toxic-appearing or diaphoretic.  HENT:     Head: Normocephalic and atraumatic.  Eyes:     General: No scleral icterus.       Right eye: No discharge.        Left eye: No discharge.     Conjunctiva/sclera: Conjunctivae normal.  Cardiovascular:     Rate and Rhythm: Normal rate.  Pulmonary:     Effort: Pulmonary effort is normal. No respiratory distress.  Musculoskeletal:        General: Normal range of motion.     Cervical back: Normal range of motion.     Lumbar back: Swelling, tenderness (R>L) and bony tenderness present. Negative right straight leg raise  test and negative left straight leg raise test.  Skin:    General: Skin is warm and dry.  Neurological:     Mental Status: He is alert and oriented to person, place, and time. Mental status is at baseline.  Psychiatric:        Mood and Affect: Mood normal.        Behavior: Behavior normal.        Thought Content: Thought content normal.        Judgment: Judgment normal.

## 2023-04-04 ENCOUNTER — Telehealth: Payer: Self-pay | Admitting: Internal Medicine

## 2023-04-04 NOTE — Telephone Encounter (Signed)
Note made in error

## 2023-07-25 ENCOUNTER — Other Ambulatory Visit: Payer: Self-pay | Admitting: Internal Medicine

## 2023-07-25 DIAGNOSIS — E785 Hyperlipidemia, unspecified: Secondary | ICD-10-CM

## 2023-10-12 ENCOUNTER — Telehealth: Payer: Self-pay | Admitting: Neurology

## 2023-10-12 NOTE — Telephone Encounter (Signed)
Pt LVM at 9:05am requesting to schedule an appointment. Called pt back and LVM to call back to schedule. Contacted pt to also inquire about the reason needed to schedule an appointment.

## 2023-10-18 ENCOUNTER — Telehealth: Payer: Self-pay | Admitting: Neurology

## 2023-10-18 NOTE — Telephone Encounter (Signed)
Pt called wanting to speak to the RN so that he can be advised on how he can get back on track with his sleep. Please advise.

## 2023-10-18 NOTE — Telephone Encounter (Signed)
Spoke to pt and he wants to restart using cpap.  He has machine and was not able to use mask/head gear.  ? If need to have appt or see dme for another mask set.  I sent message to [employer]. Awaiting word back from them.

## 2023-10-18 NOTE — Telephone Encounter (Signed)
RE: cpap user questions Received: Today New, Doristine Mango, RN; Thomas Hoff, [facility],  We will need f37f OV note showing usage and benefit. I did not see anything with the year showing that in his account. He will need an office vist.    Thank you,  Brad New     Previous Messages    ----- Message ----- From: Guy Begin, RN Sent: 10/18/2023   3:05 PM EST To: Tommie Sams; Rojelio Brenner; * Subject: cpap user questions                            Good afternoon,  This pt called, trying to get back on cpap. We saw him 01-24-2022 then he had sleep study.   He has a machine and we never saw for initial compliance appt. Do we need to see him again?, or can he start using machine and get another mask/ headgear mask refitting??    [name]. [name], 62 y.o., [birth year] MRN: [id number] Phone: [phone]  [name]

## 2023-10-19 NOTE — Telephone Encounter (Signed)
Pt called back. He cannot make the earlier time, needs after 1300.  I was able to move to 11-13-2023 at 1415.  He appreciated follow up.

## 2023-10-19 NOTE — Telephone Encounter (Signed)
I called pt and LMVM for pt that did reach out to [employer] and they will need an appt with provider.  I have held appt with Dr. Frances Furbish 11-09-2023 at 1115.  He needs to contact DME about any accounting issue.  He is to call me back about appt as soon as he picks up message.

## 2023-10-24 ENCOUNTER — Other Ambulatory Visit: Payer: Self-pay | Admitting: Internal Medicine

## 2023-10-24 DIAGNOSIS — E785 Hyperlipidemia, unspecified: Secondary | ICD-10-CM

## 2023-10-25 DIAGNOSIS — M65312 Trigger thumb, left thumb: Secondary | ICD-10-CM | POA: Diagnosis not present

## 2023-11-01 ENCOUNTER — Encounter: Payer: Self-pay | Admitting: Neurology

## 2023-11-01 ENCOUNTER — Ambulatory Visit (INDEPENDENT_AMBULATORY_CARE_PROVIDER_SITE_OTHER): Payer: BC Managed Care – PPO | Admitting: Neurology

## 2023-11-01 VITALS — BP 128/81 | HR 66 | Ht 72.0 in | Wt 238.2 lb

## 2023-11-01 DIAGNOSIS — Z789 Other specified health status: Secondary | ICD-10-CM | POA: Diagnosis not present

## 2023-11-01 DIAGNOSIS — G4733 Obstructive sleep apnea (adult) (pediatric): Secondary | ICD-10-CM | POA: Diagnosis not present

## 2023-11-01 NOTE — Progress Notes (Signed)
Subjective:    Patient ID: [name] is a 62 y.o. male.  HPI    Interim history:   [name] is a 62 year old male with an underlying medical history of hypertension, hyperlipidemia, sickle cell anemia, chronic rhinitis, coronary artery disease, reflux disease, prediabetes, BPH, smoking, overactive bladder and obesity, who presents for follow-up consultation of his obstructive sleep apnea.  The patient is unaccompanied today.  I first met him at the request of his primary care physician on 01/24/2022, at which time he reported snoring as well as witnessed dreams and acting out his dreams, and excessive daytime somnolence.  Advised to proceed with a sleep study.  He had a home sleep test through our office on 02/24/2022 which showed moderate obstructive sleep apnea with a total AHI of 15.1/hour and O2 nadir of 88%.  He was advised to proceed with home AutoPap therapy.  His set up date was 03/23/2022.  He has a ResMed air sense 11 AutoSet machine.  DME company is [employer].    Today, 11/01/2023: I reviewed his AutoPap compliance data for the past month, he has used his machine only 3 days briefly, average usage for days on treatment of only 22 minutes.  He reports having trouble with the mask.  He started off with a nasal cushion interface which did not work out as he is a Sport and exercise psychologist.  He then switched to an under the nose fullface mask which was uncomfortable.  He would like to get a traditional fullface mask but has not contacted his DME company yet for this.  He works as a Sports administrator.  He works 11 to 12 hours at a time, typically starts his work around midnight or 1 AM.  He goes to bed around 4:05 PM and may sleep till around 11 PM.  He reports having trouble sleeping.  He takes over-the-counter sleep aids nearly nightly.  With taking sleep aids he has less chance of vivid dreams.  He sometimes wakes up in the middle of the night with a headache and takes Goody powder.  He does drink  quite a bit of caffeine in the form of coffee, about 2 cups/day and at least a small, sometimes a large energy drink (red bull).  Previously:  01/24/2022: (He) reports snoring and excessive daytime somnolence as well as vivid dreams, and acting out his dreams.  I reviewed your office note from 11/03/2021.  His Epworth sleepiness score is 15 out of 24, fatigue severity score is 47 out of 63.  He had a sleep study in the past.  I was able to review his baseline sleep study report from study date 01/07/2009, study was interpreted by Dr. Marcelyn Bruins.  He was diagnosed with mild obstructive sleep apnea with an AHI of 6/h, O2 nadir of 87%.  He was found to have frequent PVCs at the time.  Sleep efficiency was 79%, REM latency 95 minutes, sleep onset latency 6 minutes.  He did not have any significant PLM's.  He is single, he lives alone.  He smokes cigarettes very occasionally and smokes cigars as well.  He has no pets in the household, he does not watch TV in his bedroom.  He drinks caffeine in the form of coffee, 1 or 2 cups/day and maybe 2 cups of tea per day.  He has occasionally woken up with a headache.  He goes to bed around 2 AM or earlier than that, rise time is around 6 AM.  He reports  that he is not able to sleep through the night, he works from 3 PM to midnight.  He is working on weight loss and since his sleep study he has lost weight, in the realm of 10 to 15 pounds.  He is not aware of any family history of sleep apnea. The patient's allergies, current medications, family history, past medical history, past social history, past surgical history and problem list were reviewed and updated as appropriate.    His Past Medical History Is Significant For: Past Medical History:  Diagnosis Date   BPH (benign prostatic hyperplasia)    Cataract    Chronic rhinitis    Coronary atherosclerosis of unspecified type of vessel, native or graft    Dysmetabolic syndrome X    Erectile dysfunction    Hypertrophy  of prostate with urinary obstruction and other lower urinary tract symptoms (LUTS)    Obstructive sleep apnea (adult) (pediatric)    no CPAP, has lost weight   Prediabetes    Primary hypertension 11/03/2021   Problems related to high-risk sexual behavior    Pure hypercholesterolemia    Sickle cell trait (HCC)    Sleep apnea    Tobacco use disorder     His Past Surgical History Is Significant For: Past Surgical History:  Procedure Laterality Date   COSMETIC SURGERY     HERNIA REPAIR     UHR  AT [facility]  <5 YRS AGO    INCISION AND DRAINAGE ABSCESS / HEMATOMA OF BURSA / KNEE / THIGH     I&D of complex abscess,left axilla. I&D of large infected pilonidal abscess   KNEE ARTHROSCOPY WITH MENISCAL REPAIR Right 02/08/2022   Procedure: RIGHT KNEE PARTIAL MEDIAL MENISCECTOMY;  Surgeon: Ernestina Columbia, MD;  Location: [facility];  Service: Orthopedics;  Laterality: Right;   LAPAROSCOPIC PARTIAL COLECTOMY N/A 03/10/2015   Procedure: LAPAROSCOPIC ASSISTED RIGHT COLECTOMY;  Surgeon: Emelia Loron, MD;  Location: [facility];  Service: General;  Laterality: N/A;   MASS EXCISION N/A 10/19/2015   Procedure: EXCISION BACK MASS;  Surgeon: Emelia Loron, MD;  Location: [facility];  Service: General;  Laterality: N/A;   PTCA  2010    His Family History Is Significant For: Family History  Problem Relation Age of Onset   Breast cancer Mother    Colon cancer Maternal Grandmother    Stroke Maternal Grandmother        Over 49   Colon cancer Maternal Grandfather    Prostate cancer Maternal Grandfather    Colon cancer Other        1st degree relative<60   Heart attack Father        Over 63   Esophageal cancer Neg Hx    Stomach cancer Neg Hx    Rectal cancer Neg Hx     His Social History Is Significant For: Social History   Socioeconomic History   Marital status: Divorced    Spouse name: Not on file   Number of children: 1   Years of education: Not on file    Highest education level: Not on file  Occupational History   Not on file  Tobacco Use   Smoking status: Some Days    Types: Cigars   Smokeless tobacco: Never  Vaping Use   Vaping status: Never Used  Substance and Sexual Activity   Alcohol use: Yes    Comment: pt states rare   Drug use: No   Sexual activity: Yes    Birth control/protection:  Condom  Other Topics Concern   Not on file  Social History Narrative   Caffeine: 1 2- cups,  Education: grad HS, [employer] (does Paramedic) Multimedia programmer. Divorced, one child,  3 grandkids.    Social Determinants of Health   Financial Resource Strain: Not on file  Food Insecurity: Not on file  Transportation Needs: Not on file  Physical Activity: Not on file  Stress: Not on file  Social Connections: Unknown (04/08/2022)   [facility], [facility]   Social Network    Social Network: Not on file    His Allergies Are:  Allergies  Allergen Reactions   Penicillins Hives and Itching    Tolerated ancef without incident  :   His Current Medications Are:  Outpatient Encounter Medications as of 11/01/2023  Medication Sig   aspirin EC 81 MG tablet Take 1 tablet (81 mg total) by mouth daily.   Multiple Vitamin (MULTI-VITAMIN) tablet Take by mouth.   rosuvastatin (CRESTOR) 10 MG tablet TAKE 1 TABLET BY MOUTH EVERY DAY   tadalafil (CIALIS) 5 MG tablet Take 5 mg by mouth daily.   fluticasone (FLONASE) 50 MCG/ACT nasal spray SPRAY 2 SPRAYS INTO EACH NOSTRIL EVERY DAY (Patient not taking: Reported on 11/01/2023)   linaclotide (LINZESS) 290 MCG CAPS capsule Take 1 capsule (290 mcg total) by mouth daily before breakfast. (Patient not taking: Reported on 11/01/2023)   [DISCONTINUED] doxycycline (AVIDOXY) 100 MG tablet Take 1 tablet (100 mg total) by mouth 2 (two) times daily.   [DISCONTINUED] loratadine (CLARITIN) 10 MG tablet Take 1 tablet (10 mg total) by mouth daily. (Patient not taking: Reported on 04/03/2023)    [DISCONTINUED] methocarbamol (ROBAXIN) 500 MG tablet Take 1 tablet (500 mg total) by mouth every 8 (eight) hours as needed for muscle spasms.   [DISCONTINUED] mupirocin ointment (BACTROBAN) 2 % Apply 1 Application topically 2 (two) times daily. (Patient not taking: Reported on 03/03/2023)   [DISCONTINUED] prednisoLONE acetate (PRED FORTE) 1 % ophthalmic suspension Place 1 drop into the left eye 4 (four) times daily.   Facility-Administered Encounter Medications as of 11/01/2023  Medication   0.9 %  sodium chloride infusion  :  Review of Systems:  Out of a complete 14 point review of systems, all are reviewed and negative with the exception of these symptoms as listed below:  Review of Systems  Neurological:        Restart process over.  Having issues with mask, (insurance issues), also sleep issue (dreams).  A lot of questions.   ESS 11.    Objective:  Neurological Exam  Physical Exam Physical Examination:   Vitals:   11/01/23 0755  BP: 128/81  Pulse: 66    General Examination: The patient is in no acute distress. He appears well-developed and well-nourished and well groomed.   HEENT: Normocephalic, atraumatic, pupils are reactive.  Face is symmetric with normal facial animation, speech is clear without dysarthria, hypophonia or voice tremor.  Neck shows full range of motion.  Airway examination reveals dentures in place, stable findings with moderate airway crowding.  Tongue protrudes centrally and palate elevates symmetrically.     Chest: Clear to auscultation without wheezing, rhonchi or crackles noted.   Heart: S1+S2+0, regular and normal without murmurs, rubs or gallops noted.    Abdomen: Soft, non-tender and non-distended.   Extremities: There is no pitting edema in the distal lower extremities bilaterally.    Skin: Warm and dry without trophic changes noted.  Musculoskeletal: exam reveals no obvious joint deformities, tenderness or joint swelling or erythema.     Neurologically:  Mental status: The patient is awake, alert and oriented in all 4 spheres. His immediate and remote memory, attention, language skills and fund of knowledge are appropriate. There is no evidence of aphasia, agnosia, apraxia or anomia. Speech is clear with normal prosody and enunciation. Thought process is linear. Mood is normal and affect is normal.  Cranial nerves II - XII are as described above under HEENT exam.  Motor exam: Normal appearing bulk, moving all 4 extremities without restriction.  No obvious action or resting tremor.   Fine motor skills and coordination: grossly intact.  Cerebellar testing: No dysmetria or intention tremor. There is no truncal or gait ataxia.  Gait, station and balance: He stands easily. No veering to one side is noted. No leaning to one side is noted. Posture is age-appropriate and stance is narrow based. Gait shows normal stride length and normal pace. No problems turning are noted.    Assessment and Plan:  In summary, [name] is a 62 year old male with an underlying medical history of hypertension, hyperlipidemia, sickle cell anemia, chronic rhinitis, coronary artery disease, reflux disease, prediabetes, BPH, smoking, overactive bladder and obesity, who presents for follow-up consultation of his obstructive sleep apnea.  His home sleep test from 02/24/2022 showed moderate obstructive sleep apnea with a total AHI of 15.1/hour and O2 nadir of 88%.  He was set up on home AutoPap therapy on 03/23/2022.  He has not been consistent and essentially has stopped using his AutoPap machine.  He has had trouble tolerating the nasal cushion interface and under the nose style fullface mask.  He is advised to get in touch with his DME provider about getting a traditional style fullface mask.  He is reminded to be consistent with AutoPap therapy for sleep apnea.  We talked about the risks and ramifications of untreated moderate obstructive sleep apnea.  He is  cautioned against the over-the-counter use consistently of sleep aids.  He is also cautioned against the use of energy drinks.  He is advised to follow-up in this clinic in about 3 months and we can see how he has done with the new mask.  He is motivated to continue with treatment.  I answered all his questions today and he was in agreement with our plan. I spent 20 minutes in total face-to-face time and in reviewing records during pre-charting, more than 50% of which was spent in counseling and coordination of care, reviewing test results, reviewing medications and treatment regimen and/or in discussing or reviewing the diagnosis of OSA, the prognosis and treatment options. Pertinent laboratory and imaging test results that were available during this visit with the patient were reviewed by me and considered in my medical decision making (see chart for details).

## 2023-11-01 NOTE — Patient Instructions (Signed)
I recommend that you restart using your AutoPap machine. While your insurance may require that you use PAP at least 4 hours each night on 70% of the nights, I recommend, that you not skip any nights (or days if you sleep during the day) and use it throughout the night if you can. Getting used to PAP and staying with the treatment long term does take time and patience and discipline. Untreated obstructive sleep apnea when it is moderate to severe can have an adverse impact on cardiovascular health and raise her risk for heart disease, arrhythmias, hypertension, congestive heart failure, stroke and diabetes. Untreated obstructive sleep apnea causes sleep disruption, nonrestorative sleep, and sleep deprivation. This can have an impact on your day to day functioning and cause daytime sleepiness and impairment of cognitive function, memory loss, mood disturbance, and problems focussing. Using PAP regularly can improve these symptoms. Please get in touch with your DME provider which is an [employer] [facility] base in [facility] as I understand.  You can talk to them about different styles of mask and get fitted for a fullface mask. Follow-up in 3 months.

## 2023-11-03 ENCOUNTER — Telehealth: Payer: Self-pay | Admitting: Neurology

## 2023-11-03 NOTE — Telephone Encounter (Signed)
Pt said, called [facility] and they have not received the order to be fitted for a mask. Would like a call from the nurse to verify if order has been sent. This is the phone number I called [phone]

## 2023-11-06 NOTE — Telephone Encounter (Signed)
New, Maryella Shivers, Otilio Jefferson, RN; Alain Honey; Jeris Penta, Luis Llorons Torres; 1 other Received, thank you!

## 2023-11-06 NOTE — Telephone Encounter (Signed)
Order sent to [facility].

## 2023-11-06 NOTE — Telephone Encounter (Signed)
I called the pt and LVM (ok per DPR) advising mask refit order has been sent to [facility]. I asked for pt to call us or [facility] if he hasn't heard from them by this Thursday morning.

## 2023-11-13 ENCOUNTER — Ambulatory Visit: Payer: Self-pay | Admitting: Neurology

## 2023-11-15 DIAGNOSIS — G4733 Obstructive sleep apnea (adult) (pediatric): Secondary | ICD-10-CM | POA: Diagnosis not present

## 2023-11-30 DIAGNOSIS — R350 Frequency of micturition: Secondary | ICD-10-CM | POA: Diagnosis not present

## 2023-11-30 DIAGNOSIS — E291 Testicular hypofunction: Secondary | ICD-10-CM | POA: Diagnosis not present

## 2023-11-30 DIAGNOSIS — N401 Enlarged prostate with lower urinary tract symptoms: Secondary | ICD-10-CM | POA: Diagnosis not present

## 2023-12-06 DIAGNOSIS — N401 Enlarged prostate with lower urinary tract symptoms: Secondary | ICD-10-CM | POA: Diagnosis not present

## 2023-12-06 DIAGNOSIS — R972 Elevated prostate specific antigen [PSA]: Secondary | ICD-10-CM | POA: Diagnosis not present

## 2023-12-06 DIAGNOSIS — R350 Frequency of micturition: Secondary | ICD-10-CM | POA: Diagnosis not present

## 2023-12-14 ENCOUNTER — Other Ambulatory Visit: Payer: Self-pay | Admitting: Urology

## 2023-12-14 DIAGNOSIS — R972 Elevated prostate specific antigen [PSA]: Secondary | ICD-10-CM

## 2023-12-16 DIAGNOSIS — G4733 Obstructive sleep apnea (adult) (pediatric): Secondary | ICD-10-CM | POA: Diagnosis not present

## 2023-12-18 DIAGNOSIS — H2010 Chronic iridocyclitis, unspecified eye: Secondary | ICD-10-CM | POA: Diagnosis not present

## 2023-12-20 ENCOUNTER — Telehealth: Payer: Self-pay | Admitting: Internal Medicine

## 2023-12-20 NOTE — Telephone Encounter (Signed)
Pt has CPE scheduled for 2/20 and lab appt for 2/14.  Please enter lab orders for this visit.

## 2023-12-27 ENCOUNTER — Telehealth: Payer: Self-pay | Admitting: Neurology

## 2023-12-27 NOTE — Telephone Encounter (Signed)
LVM and sent mychart msg informing pt of need to reschedule 02/26/24 appt - MD out

## 2023-12-30 ENCOUNTER — Other Ambulatory Visit: Payer: Self-pay | Admitting: Internal Medicine

## 2023-12-30 DIAGNOSIS — E785 Hyperlipidemia, unspecified: Secondary | ICD-10-CM

## 2023-12-30 DIAGNOSIS — N401 Enlarged prostate with lower urinary tract symptoms: Secondary | ICD-10-CM

## 2023-12-30 DIAGNOSIS — I10 Essential (primary) hypertension: Secondary | ICD-10-CM

## 2023-12-30 DIAGNOSIS — R7303 Prediabetes: Secondary | ICD-10-CM

## 2023-12-30 DIAGNOSIS — K5904 Chronic idiopathic constipation: Secondary | ICD-10-CM

## 2023-12-30 DIAGNOSIS — K219 Gastro-esophageal reflux disease without esophagitis: Secondary | ICD-10-CM

## 2024-01-03 ENCOUNTER — Encounter: Payer: Self-pay | Admitting: *Deleted

## 2024-01-12 ENCOUNTER — Other Ambulatory Visit: Payer: BC Managed Care – PPO

## 2024-01-12 DIAGNOSIS — E785 Hyperlipidemia, unspecified: Secondary | ICD-10-CM

## 2024-01-12 DIAGNOSIS — R7303 Prediabetes: Secondary | ICD-10-CM | POA: Diagnosis not present

## 2024-01-12 DIAGNOSIS — R351 Nocturia: Secondary | ICD-10-CM

## 2024-01-12 DIAGNOSIS — K219 Gastro-esophageal reflux disease without esophagitis: Secondary | ICD-10-CM | POA: Diagnosis not present

## 2024-01-12 DIAGNOSIS — I10 Essential (primary) hypertension: Secondary | ICD-10-CM

## 2024-01-12 DIAGNOSIS — K5904 Chronic idiopathic constipation: Secondary | ICD-10-CM

## 2024-01-12 DIAGNOSIS — N401 Enlarged prostate with lower urinary tract symptoms: Secondary | ICD-10-CM | POA: Diagnosis not present

## 2024-01-12 LAB — CBC WITH DIFFERENTIAL/PLATELET
Basophils Absolute: 0.1 10*3/uL (ref 0.0–0.1)
Basophils Relative: 1 % (ref 0.0–3.0)
Eosinophils Absolute: 0.2 10*3/uL (ref 0.0–0.7)
Eosinophils Relative: 3.2 % (ref 0.0–5.0)
HCT: 43.4 % (ref 39.0–52.0)
Hemoglobin: 14.4 g/dL (ref 13.0–17.0)
Lymphocytes Relative: 27.5 % (ref 12.0–46.0)
Lymphs Abs: 1.5 10*3/uL (ref 0.7–4.0)
MCHC: 33.1 g/dL (ref 30.0–36.0)
MCV: 85.5 fL (ref 78.0–100.0)
Monocytes Absolute: 0.7 10*3/uL (ref 0.1–1.0)
Monocytes Relative: 12.6 % — ABNORMAL HIGH (ref 3.0–12.0)
Neutro Abs: 3 10*3/uL (ref 1.4–7.7)
Neutrophils Relative %: 55.7 % (ref 43.0–77.0)
Platelets: 255 10*3/uL (ref 150.0–400.0)
RBC: 5.08 Mil/uL (ref 4.22–5.81)
RDW: 13.5 % (ref 11.5–15.5)
WBC: 5.3 10*3/uL (ref 4.0–10.5)

## 2024-01-12 LAB — URINALYSIS, ROUTINE W REFLEX MICROSCOPIC
Bilirubin Urine: NEGATIVE
Hgb urine dipstick: NEGATIVE
Ketones, ur: NEGATIVE
Leukocytes,Ua: NEGATIVE
Nitrite: NEGATIVE
Specific Gravity, Urine: 1.01 (ref 1.000–1.030)
Total Protein, Urine: NEGATIVE
Urine Glucose: NEGATIVE
Urobilinogen, UA: 2 — AB (ref 0.0–1.0)
pH: 6.5 (ref 5.0–8.0)

## 2024-01-12 LAB — HEPATIC FUNCTION PANEL
ALT: 19 U/L (ref 0–53)
AST: 17 U/L (ref 0–37)
Albumin: 4.1 g/dL (ref 3.5–5.2)
Alkaline Phosphatase: 87 U/L (ref 39–117)
Bilirubin, Direct: 0.1 mg/dL (ref 0.0–0.3)
Total Bilirubin: 0.5 mg/dL (ref 0.2–1.2)
Total Protein: 6.7 g/dL (ref 6.0–8.3)

## 2024-01-12 LAB — BASIC METABOLIC PANEL
BUN: 14 mg/dL (ref 6–23)
CO2: 29 meq/L (ref 19–32)
Calcium: 8.6 mg/dL (ref 8.4–10.5)
Chloride: 104 meq/L (ref 96–112)
Creatinine, Ser: 1.29 mg/dL (ref 0.40–1.50)
GFR: 60.26 mL/min (ref >60.00)
Glucose, Bld: 88 mg/dL (ref 70–99)
Potassium: 4 meq/L (ref 3.5–5.1)
Sodium: 140 meq/L (ref 135–145)

## 2024-01-12 LAB — LIPID PANEL
Cholesterol: 153 mg/dL (ref 0–200)
HDL: 43.1 mg/dL (ref >39.00)
LDL Cholesterol: 70 mg/dL (ref 0–99)
NonHDL: 109.57
Total CHOL/HDL Ratio: 4
Triglycerides: 196 mg/dL — ABNORMAL HIGH (ref 0.0–149.0)
VLDL: 39.2 mg/dL (ref 0.0–40.0)

## 2024-01-12 LAB — TSH: TSH: 0.83 u[IU]/mL (ref 0.35–5.50)

## 2024-01-12 LAB — PSA: PSA: 3.83 ng/mL (ref 0.10–4.00)

## 2024-01-12 LAB — HEMOGLOBIN A1C: Hgb A1c MFr Bld: 6.1 % (ref 4.6–6.5)

## 2024-01-16 DIAGNOSIS — G4733 Obstructive sleep apnea (adult) (pediatric): Secondary | ICD-10-CM | POA: Diagnosis not present

## 2024-01-18 ENCOUNTER — Encounter: Payer: Self-pay | Admitting: Internal Medicine

## 2024-01-18 ENCOUNTER — Ambulatory Visit: Payer: BC Managed Care – PPO | Admitting: Internal Medicine

## 2024-01-18 VITALS — BP 142/88 | HR 79 | Temp 98.0°F | Resp 16 | Ht 72.0 in | Wt 238.0 lb

## 2024-01-18 DIAGNOSIS — K5904 Chronic idiopathic constipation: Secondary | ICD-10-CM

## 2024-01-18 DIAGNOSIS — Z0001 Encounter for general adult medical examination with abnormal findings: Secondary | ICD-10-CM

## 2024-01-18 DIAGNOSIS — Z23 Encounter for immunization: Secondary | ICD-10-CM | POA: Diagnosis not present

## 2024-01-18 DIAGNOSIS — Z0000 Encounter for general adult medical examination without abnormal findings: Secondary | ICD-10-CM

## 2024-01-18 DIAGNOSIS — R0609 Other forms of dyspnea: Secondary | ICD-10-CM

## 2024-01-18 DIAGNOSIS — I10 Essential (primary) hypertension: Secondary | ICD-10-CM

## 2024-01-18 DIAGNOSIS — R768 Other specified abnormal immunological findings in serum: Secondary | ICD-10-CM | POA: Diagnosis not present

## 2024-01-18 DIAGNOSIS — Z860100 Personal history of colon polyps, unspecified: Secondary | ICD-10-CM

## 2024-01-18 DIAGNOSIS — I2510 Atherosclerotic heart disease of native coronary artery without angina pectoris: Secondary | ICD-10-CM

## 2024-01-18 LAB — BRAIN NATRIURETIC PEPTIDE: Pro B Natriuretic peptide (BNP): 9 pg/mL (ref 0.0–100.0)

## 2024-01-18 LAB — TROPONIN I (HIGH SENSITIVITY): High Sens Troponin I: 6 ng/L (ref 2–17)

## 2024-01-18 MED ORDER — ASPIRIN 81 MG PO TBEC: 81.0000 mg | DELAYED_RELEASE_TABLET | Freq: Every day | ORAL | 1 refills | Status: AC

## 2024-01-18 MED ORDER — ROSUVASTATIN CALCIUM 20 MG PO TABS
20.0000 mg | ORAL_TABLET | Freq: Every day | ORAL | 1 refills | Status: DC
Start: 2024-01-18 — End: 2024-02-19

## 2024-01-18 MED ORDER — LUBIPROSTONE 24 MCG PO CAPS: 24.0000 ug | ORAL_CAPSULE | Freq: Two times a day (BID) | ORAL | 1 refills | Status: AC

## 2024-01-18 NOTE — Patient Instructions (Signed)
 Health Maintenance, Male  Adopting a healthy lifestyle and getting preventive care are important in promoting health and wellness. Ask your health care provider about:  The right schedule for you to have regular tests and exams.  Things you can do on your own to prevent diseases and keep yourself healthy.  What should I know about diet, weight, and exercise?  Eat a healthy diet    Eat a diet that includes plenty of vegetables, fruits, low-fat dairy products, and lean protein.  Do not eat a lot of foods that are high in solid fats, added sugars, or sodium.  Maintain a healthy weight  Body mass index (BMI) is a measurement that can be used to identify possible weight problems. It estimates body fat based on height and weight. Your health care provider can help determine your BMI and help you achieve or maintain a healthy weight.  Get regular exercise  Get regular exercise. This is one of the most important things you can do for your health. Most adults should:  Exercise for at least 150 minutes each week. The exercise should increase your heart rate and make you sweat (moderate-intensity exercise).  Do strengthening exercises at least twice a week. This is in addition to the moderate-intensity exercise.  Spend less time sitting. Even light physical activity can be beneficial.  Watch cholesterol and blood lipids  Have your blood tested for lipids and cholesterol at 61 years of age, then have this test every 5 years.  You may need to have your cholesterol levels checked more often if:  Your lipid or cholesterol levels are high.  You are older than 61 years of age.  You are at high risk for heart disease.  What should I know about cancer screening?  Many types of cancers can be detected early and may often be prevented. Depending on your health history and family history, you may need to have cancer screening at various ages. This may include screening for:  Colorectal cancer.  Prostate cancer.  Skin cancer.  Lung  cancer.  What should I know about heart disease, diabetes, and high blood pressure?  Blood pressure and heart disease  High blood pressure causes heart disease and increases the risk of stroke. This is more likely to develop in people who have high blood pressure readings or are overweight.  Talk with your health care provider about your target blood pressure readings.  Have your blood pressure checked:  Every 3-5 years if you are 61-95 years of age.  Every year if you are 85 years old or older.  If you are between the ages of 29 and 29 and are a current or former smoker, ask your health care provider if you should have a one-time screening for abdominal aortic aneurysm (AAA).  Diabetes  Have regular diabetes screenings. This checks your fasting blood sugar level. Have the screening done:  Once every three years after age 23 if you are at a normal weight and have a low risk for diabetes.  More often and at a younger age if you are overweight or have a high risk for diabetes.  What should I know about preventing infection?  Hepatitis B  If you have a higher risk for hepatitis B, you should be screened for this virus. Talk with your health care provider to find out if you are at risk for hepatitis B infection.  Hepatitis C  Blood testing is recommended for:  Everyone born from [birth year] through 1963.  Anyone  with known risk factors for hepatitis C.  Sexually transmitted infections (STIs)  You should be screened each year for STIs, including gonorrhea and chlamydia, if:  You are sexually active and are younger than 62 years of age.  You are older than 61 years of age and your health care provider tells you that you are at risk for this type of infection.  Your sexual activity has changed since you were last screened, and you are at increased risk for chlamydia or gonorrhea. Ask your health care provider if you are at risk.  Ask your health care provider about whether you are at high risk for HIV. Your health care provider  may recommend a prescription medicine to help prevent HIV infection. If you choose to take medicine to prevent HIV, you should first get tested for HIV. You should then be tested every 3 months for as long as you are taking the medicine.  Follow these instructions at home:  Alcohol use  Do not drink alcohol if your health care provider tells you not to drink.  If you drink alcohol:  Limit how much you have to 0-2 drinks a day.  Know how much alcohol is in your drink. In the [place] one drink equals one 12 oz bottle of beer (355 mL), one 5 oz glass of wine (148 mL), or one 1 oz glass of hard liquor (44 mL).  Lifestyle  Do not use any products that contain nicotine or tobacco. These products include cigarettes, chewing tobacco, and vaping devices, such as e-cigarettes. If you need help quitting, ask your health care provider.  Do not use street drugs.  Do not share needles.  Ask your health care provider for help if you need support or information about quitting drugs.  General instructions  Schedule regular health, dental, and eye exams.  Stay current with your vaccines.  Tell your health care provider if:  You often feel depressed.  You have ever been abused or do not feel safe at home.  Summary  Adopting a healthy lifestyle and getting preventive care are important in promoting health and wellness.  Follow your health care provider's instructions about healthy diet, exercising, and getting tested or screened for diseases.  Follow your health care provider's instructions on monitoring your cholesterol and blood pressure.  This information is not intended to replace advice given to you by your health care provider. Make sure you discuss any questions you have with your health care provider.  Document Revised: 04/05/2021 Document Reviewed: 04/05/2021  Elsevier Patient Education  2024 [employer].

## 2024-01-18 NOTE — Progress Notes (Unsigned)
 Subjective:  Patient ID: [name], male    DOB: [birth year]  Age: 63 y.o. MRN: [id number]  CC: Annual Exam, Hypertension, Hyperlipidemia, and Coronary Artery Disease   HPI [name] presents for a CPX and f/up ----  Discussed the use of AI scribe software for clinical note transcription with the patient, who gave verbal consent to proceed.  History of Present Illness   [name] is a 63 year old male with prostate issues who presents with fatigue and urinary symptoms. He is accompanied by his partner.  He experiences persistent fatigue, feeling consistently tired and run down, with episodes of dozing off even while sitting. His partner has noticed these episodes, particularly when he is watching TV together. He uses a CPAP machine, which may be related to his fatigue.  He has urinary symptoms, including a stop-and-go flow when urinating and nocturia, waking up three to four times during the night to urinate. He sometimes feels an urgent need to urinate but struggles to start the flow. He is scheduled for an MRI of the prostate next week and last saw his urologist a couple of weeks ago.  He has a history of a positive ANA screen and is concerned about potential autoimmune conditions such as lupus or rheumatoid arthritis. He reports joint pain, particularly in his knee and wrist, with significant wrist pain about a week ago.  He mentions issues with his left eye, including redness and cloudiness, for which he was prescribed eye drops. His eye doctor has recommended a comprehensive blood test.  He has digestive issues, noting that it can take three to four days to have a bowel movement. He is considering restarting Linzess for this issue.  He reports occasional shortness of breath and cough. No chest pain, dizziness, or lightheadedness. He smokes occasionally.       Outpatient Medications Prior to Visit  Medication Sig Dispense Refill   fluticasone (FLONASE) 50 MCG/ACT nasal  spray SPRAY 2 SPRAYS INTO EACH NOSTRIL EVERY DAY (Patient taking differently: Place 2 sprays into both nostrils as needed for allergies.) 48 mL 1   Multiple Vitamin (MULTI-VITAMIN) tablet Take by mouth.     aspirin EC 81 MG tablet Take 1 tablet (81 mg total) by mouth daily. 90 tablet 1   tadalafil (CIALIS) 5 MG tablet Take 5 mg by mouth daily. (Patient not taking: Reported on 01/18/2024)     linaclotide (LINZESS) 290 MCG CAPS capsule Take 1 capsule (290 mcg total) by mouth daily before breakfast. (Patient not taking: Reported on 01/18/2024) 30 capsule 2   rosuvastatin (CRESTOR) 10 MG tablet TAKE 1 TABLET BY MOUTH EVERY DAY (Patient not taking: Reported on 01/18/2024) 90 tablet 0   No facility-administered medications prior to visit.    ROS Review of Systems  Constitutional:  Positive for fatigue. Negative for appetite change, chills, diaphoresis and fever.  HENT: Negative.    Eyes:  Positive for redness and visual disturbance. Negative for photophobia.  Respiratory:  Positive for apnea and shortness of breath. Negative for cough and wheezing.   Cardiovascular:  Negative for chest pain, palpitations and leg swelling.  Gastrointestinal:  Positive for constipation. Negative for abdominal pain, blood in stool, diarrhea, nausea and vomiting.  Genitourinary: Negative.  Negative for difficulty urinating, dysuria and hematuria.  Musculoskeletal:  Positive for arthralgias. Negative for joint swelling and myalgias.  Skin: Negative.  Negative for color change and rash.  Neurological: Negative.  Negative for dizziness, weakness, light-headedness and headaches.  Hematological:  Negative for adenopathy. Does not bruise/bleed easily.  Psychiatric/Behavioral: Negative.      Objective:  BP (!) 142/88 (BP Location: Left Arm, Patient Position: Sitting, Cuff Size: Large)   Pulse 79   Temp 98 F (36.7 C) (Oral)   Resp 16   Ht 6' (1.829 m)   Wt 238 lb (108 kg)   SpO2 97%   BMI 32.28 kg/m   BP Readings  from Last 3 Encounters:  01/18/24 (!) 142/88  11/01/23 128/81  04/03/23 138/84    Wt Readings from Last 3 Encounters:  01/18/24 238 lb (108 kg)  11/01/23 238 lb 3.2 oz (108 kg)  04/03/23 237 lb (107.5 kg)    Physical Exam Vitals reviewed.  Constitutional:      Appearance: Normal appearance.  HENT:     Nose: Nose normal.     Mouth/Throat:     Mouth: Mucous membranes are moist.  Eyes:     General: No scleral icterus.    Conjunctiva/sclera:     Right eye: Right conjunctiva is not injected. No chemosis, exudate or hemorrhage.    Left eye: Left conjunctiva is injected. No chemosis, exudate or hemorrhage. Cardiovascular:     Rate and Rhythm: Normal rate and regular rhythm.     Pulses: Normal pulses.     Heart sounds: Normal heart sounds, S1 normal and S2 normal. No murmur heard.    No friction rub. No gallop.     Comments: EKG- NSR,66 bpm No LVH, Q waves, or ST/T wave changes  Pulmonary:     Effort: Pulmonary effort is normal.     Breath sounds: No stridor. No wheezing, rhonchi or rales.  Abdominal:     General: Abdomen is flat.     Palpations: There is no mass.     Tenderness: There is no abdominal tenderness. There is no guarding.     Hernia: No hernia is present.  Musculoskeletal:     Cervical back: Neck supple.     Right lower leg: No edema.     Left lower leg: No edema.  Skin:    General: Skin is warm and dry.  Neurological:     General: No focal deficit present.     Mental Status: He is alert. Mental status is at baseline.  Psychiatric:        Mood and Affect: Mood normal.        Behavior: Behavior normal.     Lab Results  Component Value Date   WBC 5.3 01/12/2024   HGB 14.4 01/12/2024   HCT 43.4 01/12/2024   PLT 255.0 01/12/2024   GLUCOSE 88 01/12/2024   CHOL 153 01/12/2024   TRIG 196.0 (H) 01/12/2024   HDL 43.10 01/12/2024   LDLDIRECT 132.4 07/29/2008   LDLCALC 70 01/12/2024   ALT 19 01/12/2024   AST 17 01/12/2024   NA 140 01/12/2024   K 4.0  01/12/2024   CL 104 01/12/2024   CREATININE 1.29 01/12/2024   BUN 14 01/12/2024   CO2 29 01/12/2024   TSH 0.83 01/12/2024   PSA 3.83 01/12/2024   HGBA1C 6.1 01/12/2024   MICROALBUR 1.2 03/22/2012    [place] FNA BX THYROID 1ST LESION AFIRMA Result Date: 07/29/2022 INDICATION: Indeterminate thyroid nodules. EXAM: ULTRASOUND GUIDED FINE NEEDLE ASPIRATION OF INDETERMINATE ISTHMUS THYROID NODULE ULTRASOUND GUIDED FINE NEEDLE ASPIRATION OF INDETERMINATE RIGHT THYROID NODULE COMPARISON:  Thyroid ultrasound 07/26/2022 MEDICATIONS: None COMPLICATIONS: None immediate. TECHNIQUE: Informed written consent was obtained from the patient after a discussion of the risks,  benefits and alternatives to treatment. Questions regarding the procedure were encouraged and answered. A timeout was performed prior to the initiation of the procedure. Pre-procedural ultrasound scanning demonstrated unchanged size and appearance of the indeterminate nodules within the right thyroid lobe and isthmus. The procedure was planned. The neck was prepped in the usual sterile fashion, and a sterile drape was applied covering the operative field. A timeout was performed prior to the initiation of the procedure. Local anesthesia was provided with 1% lidocaine. Under direct ultrasound guidance, 5 fine-needle aspirations were performed in the isthmus nodule with 25 gauge needles. Two specimens were collected for Afirma. Multiple ultrasound images were saved for procedural documentation purposes. The samples were prepared and submitted to pathology. Under direct ultrasound guidance, 5 fine-needle aspirations were performed in the right thyroid nodule with 25 gauge needles. Two specimens were collected for Afirma. Multiple ultrasound images were saved for procedural documentation purposes. The samples were prepared and submitted to pathology. Limited post procedural scanning was negative for hematoma or additional complication. Dressings were placed. The  patient tolerated the above procedures procedure well without immediate postprocedural complication. FINDINGS: Nodule reference number based on prior diagnostic ultrasound: 1 Maximum size: 3.2 cm Location: Isthmus; Inferior ACR TI-RADS risk category: TR4 (4-6 points) Reason for biopsy: meets ACR TI-RADS criteria _________________________________________________________ Nodule reference number based on prior diagnostic ultrasound: 2 Maximum size: 4.1 cm Location: Right; Inferior ACR TI-RADS risk category: TR3 (3 points) Reason for biopsy: meets ACR TI-RADS criteria Ultrasound imaging confirms appropriate placement of the needles within the thyroid nodule. IMPRESSION: 1. Technically successful ultrasound guided fine needle aspiration of the isthmus nodule. 2. Technically successful ultrasound guided fine needle aspiration of the right inferior thyroid nodule. Electronically Signed   By: Richarda Overlie M.D.   On: 07/29/2022 11:54   [place] FNA BX THYROID 1ST LESION AFIRMA Result Date: 07/29/2022 INDICATION: Indeterminate thyroid nodules. EXAM: ULTRASOUND GUIDED FINE NEEDLE ASPIRATION OF INDETERMINATE ISTHMUS THYROID NODULE ULTRASOUND GUIDED FINE NEEDLE ASPIRATION OF INDETERMINATE RIGHT THYROID NODULE COMPARISON:  Thyroid ultrasound 07/26/2022 MEDICATIONS: None COMPLICATIONS: None immediate. TECHNIQUE: Informed written consent was obtained from the patient after a discussion of the risks, benefits and alternatives to treatment. Questions regarding the procedure were encouraged and answered. A timeout was performed prior to the initiation of the procedure. Pre-procedural ultrasound scanning demonstrated unchanged size and appearance of the indeterminate nodules within the right thyroid lobe and isthmus. The procedure was planned. The neck was prepped in the usual sterile fashion, and a sterile drape was applied covering the operative field. A timeout was performed prior to the initiation of the procedure. Local anesthesia was  provided with 1% lidocaine. Under direct ultrasound guidance, 5 fine-needle aspirations were performed in the isthmus nodule with 25 gauge needles. Two specimens were collected for Afirma. Multiple ultrasound images were saved for procedural documentation purposes. The samples were prepared and submitted to pathology. Under direct ultrasound guidance, 5 fine-needle aspirations were performed in the right thyroid nodule with 25 gauge needles. Two specimens were collected for Afirma. Multiple ultrasound images were saved for procedural documentation purposes. The samples were prepared and submitted to pathology. Limited post procedural scanning was negative for hematoma or additional complication. Dressings were placed. The patient tolerated the above procedures procedure well without immediate postprocedural complication. FINDINGS: Nodule reference number based on prior diagnostic ultrasound: 1 Maximum size: 3.2 cm Location: Isthmus; Inferior ACR TI-RADS risk category: TR4 (4-6 points) Reason for biopsy: meets ACR TI-RADS criteria _________________________________________________________ Nodule reference number based on prior diagnostic  ultrasound: 2 Maximum size: 4.1 cm Location: Right; Inferior ACR TI-RADS risk category: TR3 (3 points) Reason for biopsy: meets ACR TI-RADS criteria Ultrasound imaging confirms appropriate placement of the needles within the thyroid nodule. IMPRESSION: 1. Technically successful ultrasound guided fine needle aspiration of the isthmus nodule. 2. Technically successful ultrasound guided fine needle aspiration of the right inferior thyroid nodule. Electronically Signed   By: Richarda Overlie M.D.   On: 07/29/2022 11:54    Assessment & Plan:   Atherosclerosis of native coronary artery of native heart without angina pectoris -     EKG 12-Lead -     Brain natriuretic peptide; Future -     Troponin I (High Sensitivity); Future -     CT CORONARY MORPH W/CTA COR W/SCORE W/CA W/CM &/OR WO/CM;  Future -     Aspirin; Take 1 tablet (81 mg total) by mouth daily. Swallow whole.  Dispense: 90 tablet; Refill: 1 -     Rosuvastatin Calcium; Take 1 tablet (20 mg total) by mouth daily.  Dispense: 90 tablet; Refill: 1  DOE (dyspnea on exertion)- EKG and labs are reassuring. Will evaluate with a CA CT scan. -     Brain natriuretic peptide; Future -     Troponin I (High Sensitivity); Future -     CT CORONARY MORPH W/CTA COR W/SCORE W/CA W/CM &/OR WO/CM; Future  ANA positive- Will recheck. -     ANA, IFA Comprehensive Panel; Future  Immunization due -     Pneumococcal conjugate vaccine 20-valent  Encounter for general adult medical examination with abnormal findings - Exam completed, labs reviewed, vaccines reviewed and updated, cancer screenings are UTD, pt ed material was given.   Chronic idiopathic constipation -     Lubiprostone; Take 1 capsule (24 mcg total) by mouth 2 (two) times daily with a meal.  Dispense: 180 capsule; Refill: 1  Primary hypertension- EKG is negative for LVH. He is working on his lifestyle modifications.     Follow-up: Return in about 6 months (around 07/17/2024).  Sanda Linger, MD

## 2024-01-19 ENCOUNTER — Telehealth: Payer: Self-pay | Admitting: Pharmacy Technician

## 2024-01-19 ENCOUNTER — Other Ambulatory Visit (HOSPITAL_COMMUNITY): Payer: Self-pay

## 2024-01-19 NOTE — Telephone Encounter (Signed)
 Pharmacy Patient Advocate Encounter  Received notification from [facility] that Prior Authorization for LUBIPROSTONE CAPSULES has been APPROVED from 01/19/2024 to 01/18/2027. Unable to obtain price due to refill too soon rejection, last fill date 01/19/2024 next available fill date04/30/2025   PA #/Case ID/Reference #: [id number]

## 2024-01-20 LAB — ANA, IFA COMPREHENSIVE PANEL
Anti Nuclear Antibody (ANA): POSITIVE — AB
ENA SM Ab Ser-aCnc: <1 AI
SM/RNP: <1 AI
SSA (Ro) (ENA) Antibody, IgG: <1 AI
SSB (La) (ENA) Antibody, IgG: <1 AI
Scleroderma (Scl-70) (ENA) Antibody, IgG: <1 AI
ds DNA Ab: <1 [IU]/mL

## 2024-01-20 LAB — ANTI-NUCLEAR AB-TITER (ANA TITER): ANA Titer 1: 1:80 {titer} — ABNORMAL HIGH

## 2024-01-21 ENCOUNTER — Encounter: Payer: Self-pay | Admitting: Internal Medicine

## 2024-01-22 ENCOUNTER — Other Ambulatory Visit: Payer: Self-pay | Admitting: Internal Medicine

## 2024-01-22 DIAGNOSIS — R768 Other specified abnormal immunological findings in serum: Secondary | ICD-10-CM

## 2024-01-23 ENCOUNTER — Telehealth: Payer: Self-pay | Admitting: Internal Medicine

## 2024-01-23 NOTE — Telephone Encounter (Signed)
 Copied from CRM [id number]. Topic: General - Other >> Jan 23, 2024  2:53 PM [place] A wrote: Reason for CRM: Patient would like to know if he is to schedule appt with Rhuemotogist or does he need a referral with a doctor that specializes in Lupus. Please call after 1PM when able to call patient

## 2024-01-24 ENCOUNTER — Other Ambulatory Visit: Payer: Self-pay

## 2024-01-24 ENCOUNTER — Ambulatory Visit
Admission: RE | Admit: 2024-01-24 | Discharge: 2024-01-24 | Disposition: A | Discharge status: Home / Self Care | Payer: BC Managed Care – PPO | Source: Ambulatory Visit | Attending: Urology | Admitting: Urology

## 2024-01-24 DIAGNOSIS — R972 Elevated prostate specific antigen [PSA]: Secondary | ICD-10-CM

## 2024-01-24 MED ORDER — GADOPICLENOL 0.5 MMOL/ML IV SOLN
10.0000 mL | Freq: Once | INTRAVENOUS | Status: AC | PRN
  Administered 2024-01-24: 10 mL via INTRAVENOUS

## 2024-01-24 MED ORDER — TADALAFIL 5 MG PO TABS: 5.0000 mg | ORAL_TABLET | Freq: Every day | ORAL | 1 refills | Status: AC

## 2024-01-24 NOTE — Telephone Encounter (Signed)
 Clarified with the patient that he does need to schedule the appointment with the rheumatologist and they are the doctors that decides if he has lupus or not. He gave a verbal understanding.

## 2024-01-29 ENCOUNTER — Telehealth: Payer: Self-pay | Admitting: Neurology

## 2024-01-29 NOTE — Telephone Encounter (Signed)
 I called pt and AL/NP had appt for afternoon at 1430 on 02-05-2024.  He appreciated call back.  I told him to bring machine and powercord just in case we needed to DL his information.  He verbalized understanding

## 2024-01-29 NOTE — Telephone Encounter (Signed)
 Pt cancel appointment due to work schedule conflict. Patient ask if can switch physician for a afternoon appointment because of job related conflict. Would like a call back.  Dr. Frances Furbish did not have an available appointment

## 2024-01-29 NOTE — Telephone Encounter (Signed)
 He can schedule appt with NP.

## 2024-01-31 NOTE — Progress Notes (Signed)
 PATIENT: [name] DOB: 02-10-1961  REASON FOR VISIT: follow up HISTORY FROM: patient  Chief Complaint  Patient presents with   Obstructive Sleep Apnea    Rm1, alone, Osa on cpap: complaint to ise cpap, ESS SCORE OF 10     HISTORY OF PRESENT ILLNESS:  02/05/24 ALL:  [name] is a 63 y.o. male here today for follow up for OSA on CPAP.  He was seen in consult with Dr Frances Furbish 12/2021 for concerns of snoring, excessive daytime sleepiness and vivid dreams. He had a previous history of mild OSA diagnosed in 2010. HST 01/2022 showed moderate obstructive sleep apnea with a total AHI of 15.1/hour and O2 nadir of 88%.  He was advised to proceed with home AutoPap therapy.  His set up date was 03/23/2022.  He has a ResMed air sense 11 AutoSet machine.  DME company is [employer]. He was last seen in follow up 10/2023 and had not used CPAP consistently. He was referred to DME for consideration of FFM.   Since, he reports doing better. He is using CPAP more consistently. He denies concerns with supplies or machine. He admits that he does not use CPAP when he is not at home. He may stay away form home 1-2 times a week. He does wake with headache when not using therapy.     HISTORY: (copied from Dr Teofilo Pod previous note)  Dear Dr. Yetta Barre,   I saw your patient, [name], upon your kind request in my sleep clinic today for initial consultation of his sleep disorder, in particular, concern for underlying obstructive sleep apnea.  The patient is unaccompanied today.  As you know, [name] is a 63 year old right-handed gentleman with an underlying medical history of hypertension, hyperlipidemia, sickle cell anemia, chronic rhinitis, coronary artery disease, reflux disease, prediabetes, BPH, smoking, overactive bladder and obesity, who reports snoring and excessive daytime somnolence as well as vivid dreams, and acting out his dreams.  I reviewed your office note from 11/03/2021.  His Epworth  sleepiness score is 15 out of 24, fatigue severity score is 47 out of 63.  He had a sleep study in the past.  I was able to review his baseline sleep study report from study date 01/07/2009, study was interpreted by Dr. Marcelyn Bruins.  He was diagnosed with mild obstructive sleep apnea with an AHI of 6/h, O2 nadir of 87%.  He was found to have frequent PVCs at the time.  Sleep efficiency was 79%, REM latency 95 minutes, sleep onset latency 6 minutes.  He did not have any significant PLM's.  He is single, he lives alone.  He smokes cigarettes very occasionally and smokes cigars as well.  He has no pets in the household, he does not watch TV in his bedroom.  He drinks caffeine in the form of coffee, 1 or 2 cups/day and maybe 2 cups of tea per day.  He has occasionally woken up with a headache.  He goes to bed around 2 AM or earlier than that, rise time is around 6 AM.  He reports that he is not able to sleep through the night, he works from 3 PM to midnight.  He is working on weight loss and since his sleep study he has lost weight, in the realm of 10 to 15 pounds.  He is not aware of any family history of sleep apnea.     REVIEW OF SYSTEMS: Out of a complete 14 system review of  symptoms, the patient complains only of the following symptoms, headaches, vivid dreams, and all other reviewed systems are negative.  ESS: 10/24  ALLERGIES: Allergies  Allergen Reactions   Penicillins Hives and Itching    Tolerated ancef without incident    HOME MEDICATIONS: Outpatient Medications Prior to Visit  Medication Sig Dispense Refill   aspirin EC 81 MG tablet Take 1 tablet (81 mg total) by mouth daily. Swallow whole. 90 tablet 1   fluticasone (FLONASE) 50 MCG/ACT nasal spray SPRAY 2 SPRAYS INTO EACH NOSTRIL EVERY DAY (Patient taking differently: Place 2 sprays into both nostrils as needed for allergies.) 48 mL 1   lubiprostone (AMITIZA) 24 MCG capsule Take 1 capsule (24 mcg total) by mouth 2 (two) times daily with  a meal. 180 capsule 1   Multiple Vitamin (MULTI-VITAMIN) tablet Take by mouth.     rosuvastatin (CRESTOR) 20 MG tablet Take 1 tablet (20 mg total) by mouth daily. 90 tablet 1   tadalafil (CIALIS) 5 MG tablet Take 1 tablet (5 mg total) by mouth daily. 10 tablet 1   No facility-administered medications prior to visit.    PAST MEDICAL HISTORY: Past Medical History:  Diagnosis Date   BPH (benign prostatic hyperplasia)    Cataract    Chronic rhinitis    Coronary atherosclerosis of unspecified type of vessel, native or graft    Dysmetabolic syndrome X    Erectile dysfunction    Hypertrophy of prostate with urinary obstruction and other lower urinary tract symptoms (LUTS)    Obstructive sleep apnea (adult) (pediatric)    no CPAP, has lost weight   Prediabetes    Primary hypertension 11/03/2021   Problems related to high-risk sexual behavior    Pure hypercholesterolemia    Sickle cell trait (HCC)    Sleep apnea    Tobacco use disorder     PAST SURGICAL HISTORY: Past Surgical History:  Procedure Laterality Date   COSMETIC SURGERY     HERNIA REPAIR     UHR  AT [facility]  <5 YRS AGO    INCISION AND DRAINAGE ABSCESS / HEMATOMA OF BURSA / KNEE / THIGH     I&D of complex abscess,left axilla. I&D of large infected pilonidal abscess   KNEE ARTHROSCOPY WITH MENISCAL REPAIR Right 02/08/2022   Procedure: RIGHT KNEE PARTIAL MEDIAL MENISCECTOMY;  Surgeon: Ernestina Columbia, MD;  Location: [facility];  Service: Orthopedics;  Laterality: Right;   LAPAROSCOPIC PARTIAL COLECTOMY N/A 03/10/2015   Procedure: LAPAROSCOPIC ASSISTED RIGHT COLECTOMY;  Surgeon: Emelia Loron, MD;  Location: [facility];  Service: General;  Laterality: N/A;   MASS EXCISION N/A 10/19/2015   Procedure: EXCISION BACK MASS;  Surgeon: Emelia Loron, MD;  Location: [facility];  Service: General;  Laterality: N/A;   PTCA  2010    FAMILY HISTORY: Family History  Problem Relation Age of Onset   Breast  cancer Mother    Colon cancer Maternal Grandmother    Stroke Maternal Grandmother        Over 42   Colon cancer Maternal Grandfather    Prostate cancer Maternal Grandfather    Colon cancer Other        1st degree relative<60   Heart attack Father        Over 65   Esophageal cancer Neg Hx    Stomach cancer Neg Hx    Rectal cancer Neg Hx     SOCIAL HISTORY: Social History   Socioeconomic History   Marital status: Divorced  Spouse name: Not on file   Number of children: 1   Years of education: Not on file   Highest education level: Not on file  Occupational History   Not on file  Tobacco Use   Smoking status: Some Days    Types: Cigars   Smokeless tobacco: Never  Vaping Use   Vaping status: Never Used  Substance and Sexual Activity   Alcohol use: Yes    Comment: pt states rare   Drug use: No   Sexual activity: Yes    Birth control/protection: Condom  Other Topics Concern   Not on file  Social History Narrative   Caffeine: 1 2- cups,  Education: grad HS, [employer] (does Paramedic) Multimedia programmer. Divorced, one child,  3 grandkids.    Social Drivers of Corporate investment banker Strain: Not on file  Food Insecurity: Not on file  Transportation Needs: Not on file  Physical Activity: Not on file  Stress: Not on file  Social Connections: Unknown (04/08/2022)   Received from [facility]   Social Network    Social Network: Not on file  Intimate Partner Violence: Unknown (02/28/2022)   Received from [facility], [facility]   HITS    Physically Hurt: Not on file    Insult or Talk Down To: Not on file    Threaten Physical Harm: Not on file    Scream or Curse: Not on file     PHYSICAL EXAM  Vitals:   02/05/24 1423  BP: 116/83  Pulse: 69  Weight: 241 lb (109.3 kg)  Height: 6' (1.829 m)   Body mass index is 32.69 kg/m.  Generalized: Well developed, in no acute distress  Cardiology: normal rate and rhythm, no murmur  noted Respiratory: clear to auscultation bilaterally  Neurological examination  Mentation: Alert oriented to time, place, history taking. Follows all commands speech and language fluent Cranial nerve II-XII: Pupils were equal round reactive to light. Extraocular movements were full, visual field were full  Motor: The motor testing reveals 5 over 5 strength of all 4 extremities. Good symmetric motor tone is noted throughout.  Gait and station: Gait is normal.    DIAGNOSTIC DATA (LABS, IMAGING, TESTING) - I reviewed patient records, labs, notes, testing and imaging myself where available.      No data to display           Lab Results  Component Value Date   WBC 5.3 01/12/2024   HGB 14.4 01/12/2024   HCT 43.4 01/12/2024   MCV 85.5 01/12/2024   PLT 255.0 01/12/2024      Component Value Date/Time   NA 140 01/12/2024 1513   K 4.0 01/12/2024 1513   CL 104 01/12/2024 1513   CO2 29 01/12/2024 1513   GLUCOSE 88 01/12/2024 1513   BUN 14 01/12/2024 1513   CREATININE 1.29 01/12/2024 1513   CALCIUM 8.6 01/12/2024 1513   PROT 6.7 01/12/2024 1513   ALBUMIN 4.1 01/12/2024 1513   AST 17 01/12/2024 1513   ALT 19 01/12/2024 1513   ALKPHOS 87 01/12/2024 1513   BILITOT 0.5 01/12/2024 1513   GFRNONAA >60 11/05/2016 1647   GFRAA >60 11/05/2016 1647   Lab Results  Component Value Date   CHOL 153 01/12/2024   HDL 43.10 01/12/2024   LDLCALC 70 01/12/2024   LDLDIRECT 132.4 07/29/2008   TRIG 196.0 (H) 01/12/2024   CHOLHDL 4 01/12/2024   Lab Results  Component Value Date   HGBA1C  6.1 01/12/2024   No results found for: "VITAMINB12" Lab Results  Component Value Date   TSH 0.83 01/12/2024     ASSESSMENT AND PLAN 63 y.o. year old male  has a past medical history of BPH (benign prostatic hyperplasia), Cataract, Chronic rhinitis, Coronary atherosclerosis of unspecified type of vessel, native or graft, Dysmetabolic syndrome X, Erectile dysfunction, Hypertrophy of prostate with urinary  obstruction and other lower urinary tract symptoms (LUTS), Obstructive sleep apnea (adult) (pediatric), Prediabetes, Primary hypertension (11/03/2021), Problems related to high-risk sexual behavior, Pure hypercholesterolemia, Sickle cell trait (HCC), Sleep apnea, and Tobacco use disorder. here with     ICD-10-CM   1. OSA (obstructive sleep apnea)  G47.33 For home use only DME continuous positive airway pressure (CPAP)       Faith Rogue is doing well on CPAP therapy. Compliance report reveals acceptable daily but sub optimal four hour usage. He was encouraged to continue using CPAP nightly and for greater than 4 hours each night. We will update supply orders as indicated. Risks of untreated sleep apnea review and education materials provided. Healthy lifestyle habits encouraged. He will follow up in 1 year, sooner if needed. He verbalizes understanding and agreement with this plan.    Orders Placed This Encounter  Procedures   For home use only DME continuous positive airway pressure (CPAP)    Heated Humidity with all supplies as needed    Length of Need:   Lifetime    Patient has OSA or probable OSA:   Yes    Is the patient currently using CPAP in the home:   Yes    Settings:   Other see comments    CPAP supplies needed:   Mask, headgear, cushions, filters, heated tubing and water chamber     No orders of the defined types were placed in this encounter.     Shawnie Dapper, FNP-C 02/05/2024, 3:00 PM [facility] [address], [address] [place], [place] [address] [phone]

## 2024-01-31 NOTE — Patient Instructions (Addendum)
 Please continue using your CPAP regularly. While your insurance requires that you use CPAP at least 4 hours each night on 70% of the nights, I recommend, that you not skip any nights and use it throughout the night if you can. Getting used to CPAP and staying with the treatment long term does take time and patience and discipline. Untreated obstructive sleep apnea when it is moderate to severe can have an adverse impact on cardiovascular health and raise her risk for heart disease, arrhythmias, hypertension, congestive heart failure, stroke and diabetes. Untreated obstructive sleep apnea causes sleep disruption, nonrestorative sleep, and sleep deprivation. This can have an impact on your day to day functioning and cause daytime sleepiness and impairment of cognitive function, memory loss, mood disturbance, and problems focussing. Using CPAP regularly can improve these symptoms.  We will update supply orders, today. Please continue working on using your machine consistently.   Follow up in 1 year

## 2024-02-05 ENCOUNTER — Ambulatory Visit (INDEPENDENT_AMBULATORY_CARE_PROVIDER_SITE_OTHER): Admitting: Family Medicine

## 2024-02-05 ENCOUNTER — Encounter: Payer: Self-pay | Admitting: Family Medicine

## 2024-02-05 VITALS — BP 116/83 | HR 69 | Ht 72.0 in | Wt 241.0 lb

## 2024-02-05 DIAGNOSIS — G4733 Obstructive sleep apnea (adult) (pediatric): Secondary | ICD-10-CM | POA: Diagnosis not present

## 2024-02-07 ENCOUNTER — Telehealth: Payer: Self-pay

## 2024-02-07 ENCOUNTER — Other Ambulatory Visit (HOSPITAL_COMMUNITY): Payer: Self-pay

## 2024-02-07 NOTE — Telephone Encounter (Signed)
 Pharmacy Patient Advocate Encounter  Received notification from [facility] that Prior Authorization for Tadalafil 5MG  tablets has been APPROVED from 02/07/24 to 02/06/27. Ran test claim, Copay is $3. This test claim was processed through [facility]- copay amounts may vary at other pharmacies due to pharmacy/plan contracts, or as the patient moves through the different stages of their insurance plan.   PA #/Case ID/Reference #: [id number]

## 2024-02-16 ENCOUNTER — Telehealth: Payer: Self-pay

## 2024-02-16 DIAGNOSIS — G4733 Obstructive sleep apnea (adult) (pediatric): Secondary | ICD-10-CM | POA: Diagnosis not present

## 2024-02-16 NOTE — Telephone Encounter (Signed)
 Copied from CRM [id number]. Topic: Clinical - Prescription Issue >> Feb 15, 2024  4:46 PM Sim Boast F wrote: Reason for CRM: Rx pharmacy called requesting that the rosuvastatin medications be changed to Atorvastatin due to the cost. [facility]

## 2024-02-19 ENCOUNTER — Encounter: Payer: Self-pay | Admitting: Internal Medicine

## 2024-02-19 ENCOUNTER — Other Ambulatory Visit: Payer: Self-pay | Admitting: Internal Medicine

## 2024-02-19 DIAGNOSIS — E785 Hyperlipidemia, unspecified: Secondary | ICD-10-CM

## 2024-02-19 DIAGNOSIS — I2510 Atherosclerotic heart disease of native coronary artery without angina pectoris: Secondary | ICD-10-CM

## 2024-02-19 MED ORDER — ATORVASTATIN CALCIUM 40 MG PO TABS: 40.0000 mg | ORAL_TABLET | Freq: Every day | ORAL | 0 refills | Status: DC

## 2024-02-19 NOTE — Telephone Encounter (Signed)
**Note De-identified  [place] Obfuscation** Please advise 

## 2024-02-26 ENCOUNTER — Ambulatory Visit: Payer: BC Managed Care – PPO | Admitting: Neurology

## 2024-02-28 ENCOUNTER — Ambulatory Visit: Payer: BC Managed Care – PPO | Admitting: Neurology

## 2024-02-28 DIAGNOSIS — N401 Enlarged prostate with lower urinary tract symptoms: Secondary | ICD-10-CM | POA: Diagnosis not present

## 2024-02-28 DIAGNOSIS — R350 Frequency of micturition: Secondary | ICD-10-CM | POA: Diagnosis not present

## 2024-03-06 DIAGNOSIS — N401 Enlarged prostate with lower urinary tract symptoms: Secondary | ICD-10-CM | POA: Diagnosis not present

## 2024-03-06 DIAGNOSIS — R972 Elevated prostate specific antigen [PSA]: Secondary | ICD-10-CM | POA: Diagnosis not present

## 2024-03-06 DIAGNOSIS — R350 Frequency of micturition: Secondary | ICD-10-CM | POA: Diagnosis not present

## 2024-03-13 ENCOUNTER — Telehealth: Payer: Self-pay | Admitting: Internal Medicine

## 2024-03-13 NOTE — Telephone Encounter (Signed)
 Copied from CRM [id number]. Topic: Clinical - Medication Question >> Mar 13, 2024 11:11 AM Allyne Areola wrote: Reason for CRM: [facility] is calling for a medication change on patient's atorvastatin (LIPITOR) 40 MG tablet due to savings. Best call back number [phone].

## 2024-03-14 NOTE — Telephone Encounter (Signed)
 Patient doesn't use this pharmacy. I will not be returning their call.

## 2024-03-18 DIAGNOSIS — G4733 Obstructive sleep apnea (adult) (pediatric): Secondary | ICD-10-CM | POA: Diagnosis not present

## 2024-04-15 ENCOUNTER — Ambulatory Visit: Admitting: Internal Medicine

## 2024-04-15 ENCOUNTER — Encounter: Payer: Self-pay | Admitting: Internal Medicine

## 2024-04-15 ENCOUNTER — Ambulatory Visit

## 2024-04-15 VITALS — BP 128/76 | HR 65 | Temp 98.2°F | Ht 72.0 in | Wt 234.0 lb

## 2024-04-15 DIAGNOSIS — M545 Low back pain, unspecified: Secondary | ICD-10-CM | POA: Diagnosis not present

## 2024-04-15 DIAGNOSIS — M47816 Spondylosis without myelopathy or radiculopathy, lumbar region: Secondary | ICD-10-CM | POA: Diagnosis not present

## 2024-04-15 DIAGNOSIS — I10 Essential (primary) hypertension: Secondary | ICD-10-CM

## 2024-04-15 DIAGNOSIS — F17200 Nicotine dependence, unspecified, uncomplicated: Secondary | ICD-10-CM

## 2024-04-15 DIAGNOSIS — M48061 Spinal stenosis, lumbar region without neurogenic claudication: Secondary | ICD-10-CM | POA: Diagnosis not present

## 2024-04-15 MED ORDER — TRAMADOL HCL 50 MG PO TABS: 50.0000 mg | ORAL_TABLET | Freq: Four times a day (QID) | ORAL | 0 refills | Status: DC | PRN

## 2024-04-15 MED ORDER — CYCLOBENZAPRINE HCL 5 MG PO TABS: 5.0000 mg | ORAL_TABLET | Freq: Three times a day (TID) | ORAL | 1 refills | Status: DC | PRN

## 2024-04-15 MED ORDER — PREDNISONE 10 MG PO TABS: ORAL_TABLET | ORAL | 0 refills | Status: DC

## 2024-04-15 NOTE — Assessment & Plan Note (Signed)
 Mod to occasionally severe, likely due to underlying lumbar djd ddd flare, and has mild right lower back paraspinal spasm as well - for tramadol  50 mg prn, prednisone  taper, flexeril  5 tid prn, to check LS spine films to reassess.  Declines MRI for now.  Work note given for off work x 1 wk, pt to f/u in 4 days to reassess.

## 2024-04-15 NOTE — Assessment & Plan Note (Signed)
 Pt counsled to quit, pt not ready

## 2024-04-15 NOTE — Patient Instructions (Signed)
 You are given the work note today.    Please take all new medication as prescribed - the pain medication, prednisone , and muscle relaxer as needed  Please continue all other medications as before, and refills have been done if requested.  Please have the pharmacy call with any other refills you may need.  Please keep your appointments with your specialists as you may have planned  Please go to the XRAY Department in the first floor for the x-ray testing  You will be contacted by phone if any changes need to be made immediately.  Otherwise, you will receive a letter about your results with an explanation, but please check with MyChart first.  Please make an Appointment to return in 4 days, or sooner if needed

## 2024-04-15 NOTE — Assessment & Plan Note (Signed)
 BP Readings from Last 3 Encounters:  04/15/24 128/76  02/05/24 116/83  01/18/24 (!) 142/88   Stable, pt to continue medical treatment  - diet, wt control

## 2024-04-15 NOTE — Progress Notes (Signed)
 Patient ID: [name], male   DOB: August 01, 1961, 63 y.o.   MRN: [id number]        Chief Complaint: follow up acute low back pain       HPI:  [name] is a 63 y.o. 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  Worse to stand, lie down (wakes him up), and sitting in a certain way, as well cough or sneeze, Seems better to be standing and moving about, and no falls, fever, or overt GI or GU symptoms.  Does have weakness to the LLE but he thinks this is more due to pain mod to severe, constant since thur may 15.  Not able to work since then.  Was authorized by email regarding visit today under workmans comp.  No recent MRI.  Did have plain films may 2024 with mild lumbar DDD DJD       Wt Readings from Last 3 Encounters:  04/15/24 234 lb (106.1 kg)  02/05/24 241 lb (109.3 kg)  01/18/24 238 lb (108 kg)   BP Readings from Last 3 Encounters:  04/15/24 128/76  02/05/24 116/83  01/18/24 (!) 142/88         Past Medical History:  Diagnosis Date   BPH (benign prostatic hyperplasia)    Cataract    Chronic rhinitis    Coronary atherosclerosis of unspecified type of vessel, native or graft    Dysmetabolic syndrome X    Erectile dysfunction    Hypertrophy of prostate with urinary obstruction and other lower urinary tract symptoms (LUTS)    Obstructive sleep apnea (adult) (pediatric)    no CPAP, has lost weight   Prediabetes    Primary hypertension 11/03/2021   Problems related to high-risk sexual behavior    Pure hypercholesterolemia    Sickle cell trait (HCC)    Sleep apnea    Tobacco use disorder    Past Surgical History:  Procedure Laterality Date   COSMETIC SURGERY     HERNIA REPAIR     UHR  AT [facility]  <5 YRS AGO    INCISION AND DRAINAGE ABSCESS / HEMATOMA OF BURSA / KNEE / THIGH     I&D of  complex abscess,left axilla. I&D of large infected pilonidal abscess   KNEE ARTHROSCOPY WITH MENISCAL REPAIR Right 02/08/2022   Procedure: RIGHT KNEE PARTIAL MEDIAL MENISCECTOMY;  Surgeon: Bettyjane Brunet, MD;  Location: [facility];  Service: Orthopedics;  Laterality: Right;   LAPAROSCOPIC PARTIAL COLECTOMY N/A 03/10/2015   Procedure: LAPAROSCOPIC ASSISTED RIGHT COLECTOMY;  Surgeon: Enid Harry, MD;  Location: [facility];  Service: General;  Laterality: N/A;   MASS EXCISION N/A 10/19/2015   Procedure: EXCISION BACK MASS;  Surgeon: Enid Harry, MD;  Location: [facility];  Service: General;  Laterality: N/A;   PTCA  2010    reports that he has been smoking cigars. He has never used smokeless tobacco. He reports current alcohol use. He reports that he does not use drugs. family history includes Breast cancer in his mother; Colon cancer in his maternal grandfather, maternal grandmother, and another family member; Heart attack in his father; Prostate cancer in his maternal grandfather; Stroke in his maternal grandmother. Allergies  Allergen Reactions   Penicillins Hives and Itching    Tolerated ancef   without incident   Current Outpatient Medications on File Prior to Visit  Medication Sig Dispense Refill   fluticasone  (FLONASE ) 50 MCG/ACT nasal spray SPRAY 2 SPRAYS INTO EACH NOSTRIL EVERY DAY (Patient taking differently: Place 2 sprays into both nostrils as needed for allergies.) 48 mL 1   Multiple Vitamin (MULTI-VITAMIN) tablet Take by mouth.     tadalafil  (CIALIS ) 5 MG tablet Take 1 tablet (5 mg total) by mouth daily. 10 tablet 1   aspirin  EC 81 MG tablet Take 1 tablet (81 mg total) by mouth daily. Swallow whole. (Patient not taking: Reported on 04/15/2024) 90 tablet 1   atorvastatin  (LIPITOR) 40 MG tablet Take 1 tablet (40 mg total) by mouth daily. (Patient not taking: Reported on 04/15/2024) 90 tablet 0   lubiprostone  (AMITIZA ) 24 MCG capsule Take 1 capsule (24  mcg total) by mouth 2 (two) times daily with a meal. (Patient not taking: Reported on 04/15/2024) 180 capsule 1   prednisoLONE acetate (PRED FORTE) 1 % ophthalmic suspension SMARTSIG:In Eye(s) (Patient not taking: Reported on 04/15/2024)     rosuvastatin  (CRESTOR ) 20 MG tablet Take 20 mg by mouth at bedtime. (Patient not taking: Reported on 04/15/2024)     No current facility-administered medications on file prior to visit.        ROS:  All others reviewed and negative.  Objective        PE:  BP 128/76 (BP Location: Left Arm, Patient Position: Sitting, Cuff Size: Normal)   Pulse 65   Temp 98.2 F (36.8 C) (Oral)   Ht 6' (1.829 m)   Wt 234 lb (106.1 kg)   SpO2 99%   BMI 31.74 kg/m                 Constitutional: Pt appears in NAD               HENT: Head: NCAT.                Right Ear: External ear normal.                 Left Ear: External ear normal.                Eyes: . Pupils are equal, round, and reactive to light. Conjunctivae and EOM are normal               Nose: without d/c or deformity               Neck: Neck supple. Gross normal ROM               Cardiovascular: Normal rate and regular rhythm.                 Pulmonary/Chest: Effort normal and breath sounds without rales or wheezing.                Abd:  Soft, NT, ND, + BS, no organomegaly               Neurological: Pt is alert. At baseline orientation, motor grossly intact except LLE 3+/5               Skin: Skin is warm. No rashes, no other new lesions, LE edema - none               Psychiatric: Pt behavior is normal without agitation   Micro: none  Cardiac tracings I have personally interpreted today:  none  Pertinent Radiological findings (summarize):  none   Lab Results  Component Value Date   WBC 5.3 01/12/2024   HGB 14.4 01/12/2024   HCT 43.4 01/12/2024   PLT 255.0 01/12/2024   GLUCOSE 88 01/12/2024   CHOL 153 01/12/2024   TRIG 196.0 (H) 01/12/2024   HDL 43.10 01/12/2024   LDLDIRECT 132.4 07/29/2008    LDLCALC 70 01/12/2024   ALT 19 01/12/2024   AST 17 01/12/2024   NA 140 01/12/2024   K 4.0 01/12/2024   CL 104 01/12/2024   CREATININE 1.29 01/12/2024   BUN 14 01/12/2024   CO2 29 01/12/2024   TSH 0.83 01/12/2024   PSA 3.83 01/12/2024   HGBA1C 6.1 01/12/2024   MICROALBUR 1.2 03/22/2012   Assessment/Plan:  ALI MOHL is a 63 y.o. Black or African American [2] male with  has a past medical history of BPH (benign prostatic hyperplasia), Cataract, Chronic rhinitis, Coronary atherosclerosis of unspecified type of vessel, native or graft, Dysmetabolic syndrome X, Erectile dysfunction, Hypertrophy of prostate with urinary obstruction and other lower urinary tract symptoms (LUTS), Obstructive sleep apnea (adult) (pediatric), Prediabetes, Primary hypertension (11/03/2021), Problems related to high-risk sexual behavior, Pure hypercholesterolemia, Sickle cell trait (HCC), Sleep apnea, and Tobacco use disorder.  Acute midline low back pain without sciatica Mod to occasionally severe, likely due to underlying lumbar djd ddd flare, and has mild right lower back paraspinal spasm as well - for tramadol  50 mg prn, prednisone  taper, flexeril  5 tid prn, to check LS spine films to reassess.  Declines MRI for now.  Work note given for off work x 1 wk, pt to f/u in 4 days to reassess.    Primary hypertension BP Readings from Last 3 Encounters:  04/15/24 128/76  02/05/24 116/83  01/18/24 (!) 142/88   Stable, pt to continue medical treatment  - diet, wt control   TOBACCO ABUSE Pt counsled to quit, pt not ready  Followup: Return in about 4 days (around 04/19/2024).  Rosalia Colonel, MD 04/15/2024 7:40 PM [facility]  Primary Care - [facility]

## 2024-04-17 DIAGNOSIS — G4733 Obstructive sleep apnea (adult) (pediatric): Secondary | ICD-10-CM | POA: Diagnosis not present

## 2024-04-19 ENCOUNTER — Ambulatory Visit (INDEPENDENT_AMBULATORY_CARE_PROVIDER_SITE_OTHER): Admitting: Internal Medicine

## 2024-04-19 ENCOUNTER — Encounter: Payer: Self-pay | Admitting: Internal Medicine

## 2024-04-19 VITALS — BP 136/80 | HR 60 | Temp 98.4°F | Ht 72.0 in | Wt 241.0 lb

## 2024-04-19 DIAGNOSIS — R7303 Prediabetes: Secondary | ICD-10-CM | POA: Diagnosis not present

## 2024-04-19 DIAGNOSIS — I10 Essential (primary) hypertension: Secondary | ICD-10-CM

## 2024-04-19 DIAGNOSIS — M545 Low back pain, unspecified: Secondary | ICD-10-CM | POA: Diagnosis not present

## 2024-04-19 NOTE — Progress Notes (Signed)
 Patient ID: [name], male   DOB: 01/06/1961, 63 y.o.   MRN: [id number]        Chief Complaint: follow up low back pain for return to work status       HPI:  [name] is a 63 y.o. male here with c/o much improved midline low back pain with only small milder pain at the left lumbar paraspinal    , without worsening LE pain, numb or weakness.  Pt denies chest pain, increased sob or doe, wheezing, orthopnea, PND, increased LE swelling, palpitations, dizziness or syncope.   Pt denies polydipsia, polyuria, or new focal neuro s/s.         Wt Readings from Last 3 Encounters:  04/19/24 241 lb (109.3 kg)  04/15/24 234 lb (106.1 kg)  02/05/24 241 lb (109.3 kg)   BP Readings from Last 3 Encounters:  04/19/24 136/80  04/15/24 128/76  02/05/24 116/83         Past Medical History:  Diagnosis Date   BPH (benign prostatic hyperplasia)    Cataract    Chronic rhinitis    Coronary atherosclerosis of unspecified type of vessel, native or graft    Dysmetabolic syndrome X    Erectile dysfunction    Hypertrophy of prostate with urinary obstruction and other lower urinary tract symptoms (LUTS)    Obstructive sleep apnea (adult) (pediatric)    no CPAP, has lost weight   Prediabetes    Primary hypertension 11/03/2021   Problems related to high-risk sexual behavior    Pure hypercholesterolemia    Sickle cell trait (HCC)    Sleep apnea    Tobacco use disorder    Past Surgical History:  Procedure Laterality Date   COSMETIC SURGERY     HERNIA REPAIR     UHR  AT [facility]  <5 YRS AGO    INCISION AND DRAINAGE ABSCESS / HEMATOMA OF BURSA / KNEE / THIGH     I&D of complex abscess,left axilla. I&D of large infected pilonidal abscess   KNEE ARTHROSCOPY WITH MENISCAL REPAIR Right 02/08/2022   Procedure: RIGHT KNEE PARTIAL MEDIAL MENISCECTOMY;  Surgeon: Bettyjane Brunet, MD;  Location: [facility];  Service: Orthopedics;  Laterality: Right;   LAPAROSCOPIC PARTIAL COLECTOMY N/A 03/10/2015    Procedure: LAPAROSCOPIC ASSISTED RIGHT COLECTOMY;  Surgeon: Enid Harry, MD;  Location: [facility];  Service: General;  Laterality: N/A;   MASS EXCISION N/A 10/19/2015   Procedure: EXCISION BACK MASS;  Surgeon: Enid Harry, MD;  Location: [facility];  Service: General;  Laterality: N/A;   PTCA  2010    reports that he has been smoking cigars. He has never used smokeless tobacco. He reports current alcohol use. He reports that he does not use drugs. family history includes Breast cancer in his mother; Colon cancer in his maternal grandfather, maternal grandmother, and another family member; Heart attack in his father; Prostate cancer in his maternal grandfather; Stroke in his maternal grandmother. Allergies  Allergen Reactions   Penicillins Hives and Itching    Tolerated ancef  without incident   Current Outpatient Medications on File Prior to Visit  Medication Sig Dispense Refill   cyclobenzaprine  (FLEXERIL ) 5 MG tablet Take 1 tablet (5 mg total) by mouth 3 (three) times daily as needed. 40 tablet 1   fluticasone  (FLONASE ) 50 MCG/ACT nasal spray SPRAY 2 SPRAYS INTO EACH NOSTRIL EVERY DAY (Patient taking differently: Place 2 sprays into both nostrils as needed for allergies.) 48 mL 1  Multiple Vitamin (MULTI-VITAMIN) tablet Take by mouth.     predniSONE  (DELTASONE ) 10 MG tablet 3 tabs by mouth per day for 3 days,2tabs per day for 3 days,1tab per day for 3 days 18 tablet 0   tadalafil  (CIALIS ) 5 MG tablet Take 1 tablet (5 mg total) by mouth daily. 10 tablet 1   traMADol  (ULTRAM ) 50 MG tablet Take 1 tablet (50 mg total) by mouth every 6 (six) hours as needed. 30 tablet 0   aspirin  EC 81 MG tablet Take 1 tablet (81 mg total) by mouth daily. Swallow whole. (Patient not taking: Reported on 04/19/2024) 90 tablet 1   atorvastatin  (LIPITOR) 40 MG tablet Take 1 tablet (40 mg total) by mouth daily. (Patient not taking: Reported on 04/19/2024) 90 tablet 0   lubiprostone  (AMITIZA ) 24 MCG  capsule Take 1 capsule (24 mcg total) by mouth 2 (two) times daily with a meal. (Patient not taking: Reported on 04/19/2024) 180 capsule 1   prednisoLONE acetate (PRED FORTE) 1 % ophthalmic suspension SMARTSIG:In Eye(s) (Patient not taking: Reported on 04/19/2024)     rosuvastatin  (CRESTOR ) 20 MG tablet Take 20 mg by mouth at bedtime. (Patient not taking: Reported on 04/19/2024)     No current facility-administered medications on file prior to visit.        ROS:  All others reviewed and negative.  Objective        PE:  BP 136/80 (BP Location: Right Arm, Patient Position: Sitting, Cuff Size: Normal)   Pulse 60   Temp 98.4 F (36.9 C) (Oral)   Ht 6' (1.829 m)   Wt 241 lb (109.3 kg)   SpO2 98%   BMI 32.69 kg/m                 Constitutional: Pt appears in NAD               HENT: Head: NCAT.                Right Ear: External ear normal.                 Left Ear: External ear normal.                Eyes: . Pupils are equal, round, and reactive to light. Conjunctivae and EOM are normal               Nose: without d/c or deformity               Neck: Neck supple. Gross normal ROM               Cardiovascular: Normal rate and regular rhythm.                 Pulmonary/Chest: Effort normal and breath sounds without rales or wheezing.                Spine nontender in midline, has mild left lumbar paraspinal tender               Neurological: Pt is alert. At baseline orientation, motor grossly intact               Skin: Skin is warm. No rashes, no other new lesions, LE edema - none               Psychiatric: Pt behavior is normal without agitation   Micro: none  Cardiac tracings I have personally interpreted today:  none  Pertinent Radiological findings (summarize):  none   Lab Results  Component Value Date   WBC 5.3 01/12/2024   HGB 14.4 01/12/2024   HCT 43.4 01/12/2024   PLT 255.0 01/12/2024   GLUCOSE 88 01/12/2024   CHOL 153 01/12/2024   TRIG 196.0 (H) 01/12/2024   HDL 43.10  01/12/2024   LDLDIRECT 132.4 07/29/2008   LDLCALC 70 01/12/2024   ALT 19 01/12/2024   AST 17 01/12/2024   NA 140 01/12/2024   K 4.0 01/12/2024   CL 104 01/12/2024   CREATININE 1.29 01/12/2024   BUN 14 01/12/2024   CO2 29 01/12/2024   TSH 0.83 01/12/2024   PSA 3.83 01/12/2024   HGBA1C 6.1 01/12/2024   MICROALBUR 1.2 03/22/2012   Assessment/Plan:  [name] is a 63 y.o. Black or African American [2] male with  has a past medical history of BPH (benign prostatic hyperplasia), Cataract, Chronic rhinitis, Coronary atherosclerosis of unspecified type of vessel, native or graft, Dysmetabolic syndrome X, Erectile dysfunction, Hypertrophy of prostate with urinary obstruction and other lower urinary tract symptoms (LUTS), Obstructive sleep apnea (adult) (pediatric), Prediabetes, Primary hypertension (11/03/2021), Problems related to high-risk sexual behavior, Pure hypercholesterolemia, Sickle cell trait (HCC), Sleep apnea, and Tobacco use disorder.  Acute midline low back pain without sciatica Much improved, LS spine films still pending, but ok for Voltaren  gel prn, and given ok for return to work  Primary hypertension BP Readings from Last 3 Encounters:  04/19/24 136/80  04/15/24 128/76  02/05/24 116/83   Stable, pt to continue medical treatment  - diet wt control   Prediabetes Lab Results  Component Value Date   HGBA1C 6.1 01/12/2024   Stable, pt to continue current medical treatment  - diet, wt control  Followup: Return if symptoms worsen or fail to improve.  Rosalia Colonel, MD 04/20/2024 8:47 PM [facility]

## 2024-04-19 NOTE — Patient Instructions (Signed)
 You are given the work note today - ok to return to work without restrictions  Please continue all other medications as before, and refills have been done if requested.  Please have the pharmacy call with any other refills you may need.  Please keep your appointments with your specialists as you may have planned

## 2024-04-20 ENCOUNTER — Encounter: Payer: Self-pay | Admitting: Internal Medicine

## 2024-04-20 NOTE — Assessment & Plan Note (Signed)
 Lab Results  Component Value Date   HGBA1C 6.1 01/12/2024   Stable, pt to continue current medical treatment  - diet, wt control

## 2024-04-20 NOTE — Assessment & Plan Note (Signed)
 Much improved, LS spine films still pending, but ok for Voltaren  gel prn, and given ok for return to work

## 2024-04-20 NOTE — Assessment & Plan Note (Signed)
 BP Readings from Last 3 Encounters:  04/19/24 136/80  04/15/24 128/76  02/05/24 116/83   Stable, pt to continue medical treatment  - diet wt control

## 2024-04-22 ENCOUNTER — Ambulatory Visit: Payer: Self-pay | Admitting: Internal Medicine

## 2024-05-06 ENCOUNTER — Telehealth: Payer: Self-pay | Admitting: Internal Medicine
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
## 2024-05-09 NOTE — Telephone Encounter (Signed)
 Form has been received and I will completed it on my admin day this Friday.

## 2024-05-24 NOTE — Telephone Encounter (Signed)
 This has been completed and patient was made aware. Cleaning out my inbasket

## 2024-06-28 ENCOUNTER — Encounter: Payer: BC Managed Care – PPO | Admitting: Rheumatology

## 2024-07-09 ENCOUNTER — Encounter: Payer: Self-pay | Admitting: Rheumatology

## 2024-07-25 ENCOUNTER — Ambulatory Visit: Payer: BC Managed Care – PPO | Admitting: Rheumatology
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
## 2024-10-18 ENCOUNTER — Encounter: Payer: Self-pay | Admitting: Rheumatology

## 2024-10-18 ENCOUNTER — Ambulatory Visit

## 2024-10-18 ENCOUNTER — Ambulatory Visit: Payer: Self-pay | Attending: Rheumatology | Admitting: Rheumatology

## 2024-10-18 VITALS — BP 124/80 | HR 79 | Temp 98.5°F | Resp 17 | Ht 71.0 in | Wt 236.4 lb

## 2024-10-18 DIAGNOSIS — M79641 Pain in right hand: Secondary | ICD-10-CM

## 2024-10-18 DIAGNOSIS — K5904 Chronic idiopathic constipation: Secondary | ICD-10-CM

## 2024-10-18 DIAGNOSIS — M79642 Pain in left hand: Secondary | ICD-10-CM

## 2024-10-18 DIAGNOSIS — N3281 Overactive bladder: Secondary | ICD-10-CM

## 2024-10-18 DIAGNOSIS — G4733 Obstructive sleep apnea (adult) (pediatric): Secondary | ICD-10-CM

## 2024-10-18 DIAGNOSIS — M170 Bilateral primary osteoarthritis of knee: Secondary | ICD-10-CM | POA: Diagnosis not present

## 2024-10-18 DIAGNOSIS — M5030 Other cervical disc degeneration, unspecified cervical region: Secondary | ICD-10-CM | POA: Diagnosis not present

## 2024-10-18 DIAGNOSIS — M51369 Other intervertebral disc degeneration, lumbar region without mention of lumbar back pain or lower extremity pain: Secondary | ICD-10-CM

## 2024-10-18 DIAGNOSIS — E041 Nontoxic single thyroid nodule: Secondary | ICD-10-CM

## 2024-10-18 DIAGNOSIS — I2510 Atherosclerotic heart disease of native coronary artery without angina pectoris: Secondary | ICD-10-CM

## 2024-10-18 DIAGNOSIS — E785 Hyperlipidemia, unspecified: Secondary | ICD-10-CM

## 2024-10-18 DIAGNOSIS — M5134 Other intervertebral disc degeneration, thoracic region: Secondary | ICD-10-CM

## 2024-10-18 DIAGNOSIS — R7303 Prediabetes: Secondary | ICD-10-CM

## 2024-10-18 DIAGNOSIS — I10 Essential (primary) hypertension: Secondary | ICD-10-CM

## 2024-10-18 DIAGNOSIS — R351 Nocturia: Secondary | ICD-10-CM

## 2024-10-18 DIAGNOSIS — R7689 Other specified abnormal immunological findings in serum: Secondary | ICD-10-CM | POA: Diagnosis not present

## 2024-10-18 DIAGNOSIS — K219 Gastro-esophageal reflux disease without esophagitis: Secondary | ICD-10-CM

## 2024-10-18 DIAGNOSIS — Z872 Personal history of diseases of the skin and subcutaneous tissue: Secondary | ICD-10-CM

## 2024-10-18 DIAGNOSIS — N401 Enlarged prostate with lower urinary tract symptoms: Secondary | ICD-10-CM

## 2024-10-18 DIAGNOSIS — F17200 Nicotine dependence, unspecified, uncomplicated: Secondary | ICD-10-CM

## 2024-10-18 NOTE — Patient Instructions (Signed)
 Low Back Sprain or Strain Rehab Ask your health care provider which exercises are safe for you. Do exercises exactly as told by your health care provider and adjust them as directed. It is normal to feel mild stretching, pulling, tightness, or discomfort as you do these exercises. Stop right away if you feel sudden pain or your pain gets worse. Do not begin these exercises until told by your health care provider. Stretching and range-of-motion exercises These exercises warm up your muscles and joints and improve the movement and flexibility of your back. These exercises also help to relieve pain, numbness, and tingling. Lumbar rotation  Lie on your back on a firm bed or the floor with your knees bent. Straighten your arms out to your sides so each arm forms a 90-degree angle (right angle) with a side of your body. Slowly move (rotate) both of your knees to one side of your body until you feel a stretch in your lower back (lumbar). Try not to let your shoulders lift off the floor. Hold this position for __________ seconds. Tense your abdominal muscles and slowly move your knees back to the starting position. Repeat this exercise on the other side of your body. Repeat __________ times. Complete this exercise __________ times a day. Single knee to chest  Lie on your back on a firm bed or the floor with both legs straight. Bend one of your knees. Use your hands to move your knee up toward your chest until you feel a gentle stretch in your lower back and buttock. Hold your leg in this position by holding on to the front of your knee. Keep your other leg as straight as possible. Hold this position for __________ seconds. Slowly return to the starting position. Repeat with your other leg. Repeat __________ times. Complete this exercise __________ times a day. Prone extension on elbows  Lie on your abdomen on a firm bed or the floor (prone position). Prop yourself up on your elbows. Use your arms  to help lift your chest up until you feel a gentle stretch in your abdomen and your lower back. This will place some of your body weight on your elbows. If this is uncomfortable, try stacking pillows under your chest. Your hips should stay down, against the surface that you are lying on. Keep your hip and back muscles relaxed. Hold this position for __________ seconds. Slowly relax your upper body and return to the starting position. Repeat __________ times. Complete this exercise __________ times a day. Strengthening exercises These exercises build strength and endurance in your back. Endurance is the ability to use your muscles for a long time, even after they get tired. Pelvic tilt This exercise strengthens the muscles that lie deep in the abdomen. Lie on your back on a firm bed or the floor with your legs extended. Bend your knees so they are pointing toward the ceiling and your feet are flat on the floor. Tighten your lower abdominal muscles to press your lower back against the floor. This motion will tilt your pelvis so your tailbone points up toward the ceiling instead of pointing to your feet or the floor. To help with this exercise, you may place a small towel under your lower back and try to push your back into the towel. Hold this position for __________ seconds. Let your muscles relax completely before you repeat this exercise. Repeat __________ times. Complete this exercise __________ times a day. Alternating arm and leg raises  Get on your hands  and knees on a firm surface. If you are on a hard floor, you may want to use padding, such as an exercise mat, to cushion your knees. Line up your arms and legs. Your hands should be directly below your shoulders, and your knees should be directly below your hips. Lift your left leg behind you. At the same time, raise your right arm and straighten it in front of you. Do not lift your leg higher than your hip. Do not lift your arm higher  than your shoulder. Keep your abdominal and back muscles tight. Keep your hips facing the ground. Do not arch your back. Keep your balance carefully, and do not hold your breath. Hold this position for __________ seconds. Slowly return to the starting position. Repeat with your right leg and your left arm. Repeat __________ times. Complete this exercise __________ times a day. Abdominal set with straight leg raise  Lie on your back on a firm bed or the floor. Bend one of your knees and keep your other leg straight. Tense your abdominal muscles and lift your straight leg up, 4-6 inches (10-15 cm) off the ground. Keep your abdominal muscles tight and hold this position for __________ seconds. Do not hold your breath. Do not arch your back. Keep it flat against the ground. Keep your abdominal muscles tense as you slowly lower your leg back to the starting position. Repeat with your other leg. Repeat __________ times. Complete this exercise __________ times a day. Single leg lower with bent knees Lie on your back on a firm bed or the floor. Tense your abdominal muscles and lift your feet off the floor, one foot at a time, so your knees and hips are bent in 90-degree angles (right angles). Your knees should be over your hips and your lower legs should be parallel to the floor. Keeping your abdominal muscles tense and your knee bent, slowly lower one of your legs so your toe touches the ground. Lift your leg back up to return to the starting position. Do not hold your breath. Do not let your back arch. Keep your back flat against the ground. Repeat with your other leg. Repeat __________ times. Complete this exercise __________ times a day. Posture and body mechanics Good posture and healthy body mechanics can help to relieve stress in your body's tissues and joints. Body mechanics refers to the movements and positions of your body while you do your daily activities. Posture is part of body  mechanics. Good posture means: Your spine is in its natural S-curve position (neutral). Your shoulders are pulled back slightly. Your head is not tipped forward (neutral). Follow these guidelines to improve your posture and body mechanics in your everyday activities. Standing  When standing, keep your spine neutral and your feet about hip-width apart. Keep a slight bend in your knees. Your ears, shoulders, and hips should line up. When you do a task in which you stand in one place for a long time, place one foot up on a stable object that is 2-4 inches (5-10 cm) high, such as a footstool. This helps keep your spine neutral. Sitting  When sitting, keep your spine neutral and keep your feet flat on the floor. Use a footrest, if necessary, and keep your thighs parallel to the floor. Avoid rounding your shoulders, and avoid tilting your head forward. When working at a desk or a computer, keep your desk at a height where your hands are slightly lower than your elbows. Slide your  chair under your desk so you are close enough to maintain good posture. When working at a computer, place your monitor at a height where you are looking straight ahead and you do not have to tilt your head forward or downward to look at the screen. Resting When lying down and resting, avoid positions that are most painful for you. If you have pain with activities such as sitting, bending, stooping, or squatting, lie in a position in which your body does not bend very much. For example, avoid curling up on your side with your arms and knees near your chest (fetal position). If you have pain with activities such as standing for a long time or reaching with your arms, lie with your spine in a neutral position and bend your knees slightly. Try the following positions: Lying on your side with a pillow between your knees. Lying on your back with a pillow under your knees. Lifting  When lifting objects, keep your feet at least  shoulder-width apart and tighten your abdominal muscles. Bend your knees and hips and keep your spine neutral. It is important to lift using the strength of your legs, not your back. Do not lock your knees straight out. Always ask for help to lift heavy or awkward objects. This information is not intended to replace advice given to you by your health care provider. Make sure you discuss any questions you have with your health care provider. Document Revised: 03/20/2023 Document Reviewed: 02/01/2021 [employer] Patient Education  2024 [employer]Thoracic Strain Rehab Ask your health care provider which exercises are safe for you. Do exercises exactly as told by your provider and adjust them as directed. It is normal to feel mild stretching, pulling, tightness, or discomfort as you do these exercises. Stop right away if you feel sudden pain or your pain gets worse. Do not begin these exercises until told by your provider. Stretching and range-of-motion exercise This exercise warms up your muscles and joints and improves the movement and flexibility of your back and shoulders. This exercise also helps to relieve pain. Chest and spine stretch  Lie down on your back on a firm surface. Roll a towel or a small blanket so it is about 4 inches (10 cm) in diameter. Put the towel under the middle of your back so it is under your spine, but not under your shoulder blades. Put your hands behind your head and let your elbows fall to your sides. This will increase your stretch. Take a deep breath (inhale). Hold for __________ seconds. Relax after you breathe out (exhale). Repeat __________ times. Complete this exercise __________ times a day. Strengthening exercises These exercises build strength and endurance in your back and your shoulder blade muscles. Endurance is the ability to use your muscles for a long time, even after they get tired. Alternating arm and leg raises  Get on your hands and knees on a  firm surface. If you are on a hard floor, you may want to use padding, such as an exercise mat, to cushion your knees. Line up your arms and legs. Your hands should be directly below your shoulders, and your knees should be directly below your hips. Lift your left leg behind you. At the same time, raise your right arm and straighten it in front of you. Do not lift your leg higher than your hip. Do not lift your arm higher than your shoulder. Keep your abdominal and back muscles tight. Keep your hips facing the ground. Do  not arch your back. Carefully stay balanced. Do not hold your breath. Hold for __________ seconds. Slowly return to the starting position and repeat with your right leg and your left arm. Repeat __________ times. Complete this exercise __________ times a day. Straight arm rows This exercise is also called the shoulder extension exercise. Stand with your feet shoulder width apart. Secure an exercise band to a stable object in front of you so the band is at or above shoulder height. Hold one end of the exercise band in each hand. Straighten your elbows and lift your hands up to shoulder height. Step back, away from the secured end of the exercise band, until the band stretches. Squeeze your shoulder blades together and pull your hands down to the sides of your thighs. Stop when your hands are straight down by your sides. This is shoulder extension. Do not let your hands go behind your body. Hold for __________ seconds. Slowly return to the starting position. Repeat __________ times. Complete this exercise __________ times a day. Rowing scapular retraction This is an exercise in which the shoulder blades (scapulae) are pulled toward each other (retraction). Sit in a stable chair without armrests, or stand up. Secure an exercise band to a stable object in front of you so the band is at shoulder height. Hold one end of the exercise band in each hand. Your palms should face  toward each other. Bring your arms out straight in front of you. Step back, away from the secured end of the exercise band, until the band stretches. Pull the band backward. As you do this, bend your elbows and squeeze your shoulder blades together, but avoid letting the rest of your body move. Do not shrug your shoulders upward while you do this. Stop when your elbows are at your sides or slightly behind your body. Hold for __________ seconds. Slowly straighten your arms to return to the starting position. Repeat __________ times. Complete this exercise __________ times a day. Posture and body mechanics Good posture and healthy body mechanics can help to relieve stress in your body's tissues and joints. Body mechanics refers to the movements and positions of your body while you do your daily activities. Posture is part of body mechanics. Good posture means: Your spine is in its natural S-curve position (neutral). Your shoulders are pulled back slightly. Your head is not tipped forward. Follow these guidelines to improve your posture and body mechanics in your everyday activities. Standing  When standing, keep your spine neutral and your feet about hip width apart. Keep a slight bend in your knees. Your ears, shoulders, and hips should line up with each other. When you do a task in which you lean forward while standing in one place for a long time, place one foot up on a stable object that is 2-4 inches (5-10 cm) high, such as a footstool. This helps keep your spine neutral. Sitting  When sitting, keep your spine neutral and keep your feet flat on the floor. Use a footrest if needed. Keep your thighs parallel to the floor. Avoid rounding your shoulders, and avoid tilting your head forward. When working at a desk or a computer, keep your desk at a height where your hands are slightly lower than your elbows. Slide your chair under your desk so you are close enough to maintain good posture. When  working at a computer, place your monitor at a height where you are looking straight ahead and you do not have to tilt  your head forward or downward to look at the screen. Resting When lying down and resting, avoid positions that are most painful for you. If you have pain with activities such as sitting, bending, stooping, or squatting (flexion-basedactivities), lie in a position in which your body does not bend very much. For example, avoid curling up on your side with your arms and knees near your chest (fetal position). If you have pain with activities such as standing for a long time or reaching with your arms (extension-basedactivities), lie with your spine in a neutral position and bend your knees slightly. Try the following positions: Lie on your side with a pillow between your knees. Lie on your back with a pillow under your knees.  Lifting  When lifting objects, keep your feet at least shoulder width apart and tighten your abdominal muscles. Bend your knees and hips and keep your spine neutral. It is important to lift using the strength of your legs, not your back. Do not lock your knees straight out. Always ask for help to lift heavy or awkward objects. This information is not intended to replace advice given to you by your health care provider. Make sure you discuss any questions you have with your health care provider. Document Revised: 04/28/2023 Document Reviewed: 07/04/2022 [employer] Patient Education  2024 [employer]Cervical Strain and Sprain Rehab Ask your health care provider which exercises are safe for you. Do exercises exactly as told by your health care provider and adjust them as directed. It is normal to feel mild stretching, pulling, tightness, or discomfort as you do these exercises. Stop right away if you feel sudden pain or your pain gets worse. Do not begin these exercises until told by your health care provider. Stretching and range-of-motion exercises Cervical side  bending  Using good posture, sit on a stable chair or stand up. Without moving your shoulders, slowly tilt your left / right ear to your shoulder until you feel a stretch in the neck muscles on the opposite side. You should be looking straight ahead. Hold for __________ seconds. Repeat with the other side of your neck. Repeat __________ times. Complete this exercise __________ times a day. Cervical rotation  Using good posture, sit on a stable chair or stand up. Slowly turn your head to the side as if you are looking over your left / right shoulder. Keep your eyes level with the ground. Stop when you feel a stretch along the side and the back of your neck. Hold for __________ seconds. Repeat this by turning to your other side. Repeat __________ times. Complete this exercise __________ times a day. Thoracic extension and pectoral stretch  Roll a towel or a small blanket so it is about 4 inches (10 cm) in diameter. Lie down on your back on a firm surface. Put the towel in the middle of your back across your spine. It should not be under your shoulder blades. Put your hands behind your head and let your elbows fall out to your sides. Hold for __________ seconds. Repeat __________ times. Complete this exercise __________ times a day. Strengthening exercises Upper cervical flexion  Lie on your back with a thin pillow behind your head or a small, rolled-up towel under your neck. Gently tuck your chin toward your chest and nod your head down to look toward your feet. Do not lift your head off the pillow. Hold for __________ seconds. Release the tension slowly. Relax your neck muscles completely before you repeat this exercise. Repeat __________  times. Complete this exercise __________ times a day. Cervical extension  Stand about 6 inches (15 cm) away from a wall, with your back facing the wall. Place a soft object, about 6-8 inches (15-20 cm) in diameter, between the back of your head and  the wall. A soft object could be a small pillow, a ball, or a folded towel. Gently tilt your head back and press into the soft object. Keep your jaw and forehead relaxed. Hold for __________ seconds. Release the tension slowly. Relax your neck muscles completely before you repeat this exercise. Repeat __________ times. Complete this exercise __________ times a day. Posture and body mechanics Body mechanics refer to the movements and positions of your body while you do your daily activities. Posture is part of body mechanics. Good posture and healthy body mechanics can help to relieve stress in your body's tissues and joints. Good posture means that your spine is in its natural S-curve position (your spine is neutral), your shoulders are pulled back slightly, and your head is not tipped forward. The following are general guidelines for using improved posture and body mechanics in your everyday activities. Sitting  When sitting, keep your spine neutral and keep your feet flat on the floor. Use a footrest, if needed, and keep your thighs parallel to the floor. Avoid rounding your shoulders. Avoid tilting your head forward. When working at a desk or a computer, keep your desk at a height where your hands are slightly lower than your elbows. Slide your chair under your desk so you are close enough to maintain good posture. When working at a computer, place your monitor at a height where you are looking straight ahead and you do not have to tilt your head forward or downward to look at the screen. Standing  When standing, keep your spine neutral and keep your feet about hip-width apart. Keep a slight bend in your knees. Your ears, shoulders, and hips should line up. When you do a task in which you stand in one place for a long time, place one foot up on a stable object that is 2-4 inches (5-10 cm) high, such as a footstool. This helps keep your spine neutral. Resting When lying down and resting, avoid  positions that are most painful for you. Try to support your neck in a neutral position. You can use a contour pillow or a small rolled-up towel. Your pillow should support your neck but not push on it. This information is not intended to replace advice given to you by your health care provider. Make sure you discuss any questions you have with your health care provider. Document Revised: 03/20/2023 Document Reviewed: 06/06/2022 [employer] Patient Education  2024 [employer].

## 2024-11-14 ENCOUNTER — Ambulatory Visit: Payer: Self-pay

## 2024-11-14 ENCOUNTER — Emergency Department (HOSPITAL_COMMUNITY)
Admission: EM | Admit: 2024-11-14 | Discharge: 2024-11-14 | Disposition: A | Discharge status: Home / Self Care | Attending: Emergency Medicine | Admitting: Emergency Medicine

## 2024-11-14 ENCOUNTER — Emergency Department (HOSPITAL_COMMUNITY)

## 2024-11-14 DIAGNOSIS — F17200 Nicotine dependence, unspecified, uncomplicated: Secondary | ICD-10-CM | POA: Insufficient documentation

## 2024-11-14 DIAGNOSIS — Z7982 Long term (current) use of aspirin: Secondary | ICD-10-CM | POA: Insufficient documentation

## 2024-11-14 DIAGNOSIS — R0789 Other chest pain: Secondary | ICD-10-CM | POA: Diagnosis present

## 2024-11-14 DIAGNOSIS — I10 Essential (primary) hypertension: Secondary | ICD-10-CM | POA: Insufficient documentation

## 2024-11-14 LAB — CBC
HCT: 41.9 % (ref 39.0–52.0)
Hemoglobin: 14.4 g/dL (ref 13.0–17.0)
MCH: 28.8 pg (ref 26.0–34.0)
MCHC: 34.4 g/dL (ref 30.0–36.0)
MCV: 83.8 fL (ref 80.0–100.0)
Platelets: 260 K/uL (ref 150–400)
RBC: 5 MIL/uL (ref 4.22–5.81)
RDW: 12.9 % (ref 11.5–15.5)
WBC: 5.6 K/uL (ref 4.0–10.5)
nRBC: 0 % (ref 0.0–0.2)

## 2024-11-14 LAB — BASIC METABOLIC PANEL WITH GFR
Anion gap: 10 (ref 5–15)
BUN: 14 mg/dL (ref 8–23)
CO2: 23 mmol/L (ref 22–32)
Calcium: 8.8 mg/dL — ABNORMAL LOW (ref 8.9–10.3)
Chloride: 108 mmol/L (ref 98–111)
Creatinine, Ser: 1.11 mg/dL (ref 0.61–1.24)
GFR, Estimated: >60 mL/min (ref >60)
Glucose, Bld: 86 mg/dL (ref 70–99)
Potassium: 3.8 mmol/L (ref 3.5–5.1)
Sodium: 141 mmol/L (ref 135–145)

## 2024-11-14 LAB — TROPONIN T, HIGH SENSITIVITY: Troponin T High Sensitivity: <15 ng/L (ref 0–19)

## 2024-11-14 MED ORDER — KETOROLAC TROMETHAMINE 30 MG/ML IJ SOLN
30.0000 mg | Freq: Once | INTRAMUSCULAR | Status: AC
  Administered 2024-11-14: 18:00:00 30 mg via INTRAVENOUS
  Filled 2024-11-14: qty 1

## 2024-11-14 MED ADMIN — Iohexol IV Soln 350 MG/ML: 100 mL | INTRAVENOUS | @ 19:00:00 | NDC 00407141491

## 2024-11-14 NOTE — Discharge Instructions (Addendum)
 Please call to schedule follow-up appointment with a cardiologist.  Please be aware that a single normal workup in the ER does not rule out all serious or life-threatening disease.  Is important that you follow-up as an outpatient.

## 2024-11-14 NOTE — ED Provider Notes (Signed)
 [place] AT [facility] Provider Note   CSN: [id number] Arrival date & time: 11/14/24  1542     Patient presents with: Chest Pain   [name] is a 63 y.o. male with a history of high blood pressure, presented to ED with complaint of chest pain.  Patient reports that he is a regular weightlifter and about 2 weeks ago, he had an excessively hard day of chest exercises.  He said he was pushing himself to the limit.  He says the next morning he felt okay, but on the second day after that workout, he began noticing a pain on the left side of his chest.  Is been very persistent now for 2 weeks.  It is worse with bending forward and also taking a deep breath.  He does not had a panic this before.  He said it felt like it shifted a little bit the other day down to the left lateral breast, but now came up to the parasternal region where it started.  He denies that it radiates down his arm or into his jaw.  He reports he has been smoking for nearly 40 years, about a pack a day.  Denies any known personal or family history of aneurysms.  He reports he had a heart workup maybe 18 years ago when he was told and might need a stent in the future but he has not followed up with the cardiologist since then.  He says he is typically extremely active, exercises regularly at the gym and plays basketball.  He does not typically get exertional chest pain.  Denies any calf pain or swelling.  Denies any known personal or family history of DVT or PE.  He did take some NSAIDs over the past week for a back injury and did not feel that it helped his chest pain at all.   HPI     Prior to Admission medications  Medication Sig Start Date End Date Taking? Authorizing Provider  lidocaine  (LIDODERM ) 5 % Place 1 patch onto the skin daily as needed for up to 14 days. Remove & Discard patch within 12 hours or as directed by MD 11/14/24 11/28/24 Yes Xareni Kelch, Donnice PARAS, MD  aspirin  EC 81 MG  tablet Take 1 tablet (81 mg total) by mouth daily. Swallow whole. 01/18/24   Joshua Debby CROME, MD  fluticasone  (FLONASE ) 50 MCG/ACT nasal spray SPRAY 2 SPRAYS INTO EACH NOSTRIL EVERY DAY Patient taking differently: Place 2 sprays into both nostrils as needed for allergies. 12/12/22   Henson, Vickie L, NP-C  lubiprostone  (AMITIZA ) 24 MCG capsule Take 1 capsule (24 mcg total) by mouth 2 (two) times daily with a meal. Patient taking differently: Take 24 mcg by mouth as needed. 01/18/24   Joshua Debby CROME, MD  Multiple Vitamin (MULTI-VITAMIN) tablet Take by mouth.    [provider]  tadalafil  (CIALIS ) 5 MG tablet Take 1 tablet (5 mg total) by mouth daily. Patient not taking: Reported on 10/18/2024 01/24/24   Joshua Debby CROME, MD    Allergies: Penicillins    Review of Systems  Updated Vital Signs BP 138/80   Pulse 67   Temp 98.1 F (36.7 C) (Oral)   Resp 16   SpO2 100%   Physical Exam Constitutional:      General: He is not in acute distress.    Comments: Muscular build  HENT:     Head: Normocephalic and atraumatic.  Eyes:     Conjunctiva/sclera: Conjunctivae  normal.     Pupils: Pupils are equal, round, and reactive to light.  Cardiovascular:     Rate and Rhythm: Normal rate and regular rhythm.  Pulmonary:     Effort: Pulmonary effort is normal. No respiratory distress.  Abdominal:     General: There is no distension.     Tenderness: There is no abdominal tenderness.  Musculoskeletal:     Comments: Left chest wall focal pectoral tenderness on exam  Skin:    General: Skin is warm and dry.  Neurological:     General: No focal deficit present.     Mental Status: He is alert. Mental status is at baseline.  Psychiatric:        Mood and Affect: Mood normal.        Behavior: Behavior normal.     (all labs ordered are listed, but only abnormal results are displayed) Labs Reviewed  BASIC METABOLIC PANEL WITH GFR - Abnormal; Notable for the following components:      Result  Value   Calcium  8.8 (*)    All other components within normal limits  CBC  TROPONIN T, HIGH SENSITIVITY    EKG: EKG Interpretation Date/Time:  Thursday November 14 2024 15:55:46 EST Ventricular Rate:  79 PR Interval:  148 QRS Duration:  82 QT Interval:  370 QTC Calculation: 424 R Axis:   8  Text Interpretation: Normal sinus rhythm When compared with ECG of 05-Nov-2016 16:17, PREVIOUS ECG IS PRESENT No STEMI Confirmed by Cottie Cough [phone]) on 11/14/2024 4:05:43 PM  Radiology: CT Angio Chest/Abd/Pel for Dissection W and/or Wo Contrast Result Date: 11/14/2024 EXAM: CTA CHEST AND ABDOMEN WITH AND WITHOUT CONTRAST 11/14/2024 06:58:34 PM TECHNIQUE: CTA images of the chest and abdomen without and with intravenous contrast. 100 mL (iohexol  (OMNIPAQUE ) 350 MG/ML injection 100 mL IOHEXOL  350 MG/ML SOLN) was administered. Three-dimensional MIP/volume rendered formations were performed. Automated exposure control, iterative reconstruction, and/or weight based adjustment of the mA/kV was utilized to reduce the radiation dose to as low as reasonably achievable. COMPARISON: Same day x-ray and CT 11/05/2016. CLINICAL HISTORY: Acute aortic syndrome (AAS) suspected. Chest pain and shortness of breath FINDINGS: VASCULATURE: AORTA: No evidence of intramural hematoma in the aorta on the noncontrast CT of the chest. Contrast-enhanced CT of the chest, abdomen, and pelvis demonstrates no acute aortic syndrome. Mild aortic atherosclerosis. No abdominal aortic aneurysm. No dissection. PULMONARY ARTERIES: No pulmonary embolism. ARCH VESSELS: No acute finding. No dissection. No arterial occlusion or significant stenosis. CELIAC TRUNK: No acute finding. No occlusion or significant stenosis. SUPERIOR MESENTERIC ARTERY: No acute finding. No occlusion or significant stenosis. INFERIOR MESENTERIC ARTERY: No acute finding. No occlusion or significant stenosis. RENAL ARTERIES: No acute finding. No occlusion or significant  stenosis. CHEST: MEDIASTINUM: Heart and pericardium are unremarkable. The central airways are clear. Multinodular thyroid  previously evaluated by ultrasound in 2023. LYMPH NODES: No mediastinal, hilar or axillary lymphadenopathy. LUNGS AND PLEURA: No focal consolidation or pulmonary edema. No pleural effusion or pneumothorax. SOFT TISSUES AND BONES: No acute abnormality of the bones or soft tissues. ABDOMEN: LIVER: The liver is unremarkable. GALLBLADDER AND BILE DUCTS: Gallbladder is unremarkable. No biliary ductal dilatation. SPLEEN: The spleen is unremarkable. PANCREAS: The pancreas is unremarkable. ADRENAL GLANDS: Bilateral adrenal glands demonstrate no acute abnormality. KIDNEYS: The kidneys are unremarkable without hydronephrosis. GI AND BOWEL: Stomach and visualized bowel are unremarkable. Postoperative change about the right colon. PERITONEUM AND RETROPERITONEUM: No ascites or free air. LYMPH NODES: No lymphadenopathy. BONES AND SOFT TISSUES: No acute  abnormality of the bones. No acute soft tissue abnormality. IMPRESSION: 1. No evidence of acute aortic syndrome. Electronically signed by: Norman Gatlin MD 11/14/2024 07:15 PM EST RP Workstation: [id number]   DG Chest 2 View Result Date: 11/14/2024 EXAM: 2 VIEW(S) XRAY OF THE CHEST 11/14/2024 04:27:00 PM COMPARISON: None available. CLINICAL HISTORY: chest pain FINDINGS: LUNGS AND PLEURA: No focal pulmonary opacity. No pleural effusion. No pneumothorax. HEART AND MEDIASTINUM: No acute abnormality of the cardiac and mediastinal silhouettes. BONES AND SOFT TISSUES: No acute osseous abnormality. IMPRESSION: 1. No acute process. Electronically signed by: Greig Pique MD 11/14/2024 04:49 PM EST RP Workstation: [id number]     Procedures   Medications Ordered in the ED  ketorolac  (TORADOL ) 30 MG/ML injection 30 mg (30 mg Intravenous Given 11/14/24 1735)  iohexol  (OMNIPAQUE ) 350 MG/ML injection 100 mL (100 mLs Intravenous Contrast Given 11/14/24 1851)     Clinical Course as of 11/14/24 2252  Thu Nov 14, 2024  1947 BP 120's/80's on reassessment, not persistently hypertensive, stable for discharge with cardiology referral given his risk factor including extensive smoking history [MT]    Clinical Course User Index [MT] Orella Cushman, Donnice PARAS, MD                                 Medical Decision Making Amount and/or Complexity of Data Reviewed Labs: ordered. Radiology: ordered.  Risk Prescription drug management.   This patient presents to the Emergency Department with complaint of chest pain. This involves an extensive number of treatment options, and is a complaint that carries with it a high risk of complications and morbidity, given the patient's comorbidity, including high blood pressure, smoking.The differential diagnosis includes ACS vs Pneumothorax vs Reflux/Gastritis vs MSK pain vs Pneumonia vs other.  I felt PE was less likely given that patient has no risk factors acute PE, no tachycardia or hypoxia  I ordered, reviewed, and interpreted labs.  Pertinent results include no emergent findings.  Troponin is undetectable with several days of ongoing symptoms, I do not believe need to repeat troponin per ACEP clinical guidelines  I ordered medication IV toradol  for chest pain I ordered imaging studies which included x-ray of the chest, CT dissection study chest abdomen pelvis I independently visualized and interpreted imaging which showed no emergent finding and the monitor tracing which showed sinus rhythm. I agree with the radiologist interpretation External records obtained and reviewed showingCardiology evaluation most recently from 2016, with heart care, noting Cath 2006 showing normal LM, 40% LAD, normal LCX, normal RCA. I personally reviewed the patients ECG which showed sinus rhythm with no acute ischemic findings  After the interventions stated above, I reevaluated the patient and found that they  had no worsening  symptoms  Based on the patient's clinical exam, vital signs, risk factors, and ED testing, I felt that the patient's overall risk of life-threatening emergency such as ACS, PE, sepsis, or infection was low.  At this time, I felt the patient's presentation was most clinically consistent with chest wall pain, but explained to the patient that this evaluation was not a definitive diagnostic workup.  I discussed outpatient follow up with primary care provider, and provided specialist office number on the patient's discharge paper if a referral was deemed necessary.  Return precautions were discussed with the patient.  I felt the patient was clinically stable for discharge.  HEART score of 3 - low risk of MACE but encouraged to follow up  with cardiology given hx of smoking and known CAD in the past.  Patient verbalized understanding.     Final diagnoses:  Chest wall pain  TOBACCO ABUSE    ED Discharge Orders          Ordered    lidocaine  (LIDODERM ) 5 %  Daily PRN        11/14/24 1946    Ambulatory referral to Cardiology        11/14/24 1947               Cottie Donnice PARAS, MD 11/14/24 2253

## 2024-11-14 NOTE — ED Triage Notes (Signed)
 Patient reports mid sternal CP for 2 weeks, SOB, pain with inhalation/cough/stretching. Patient is alert and oriented x 4. Airway patent, respirations even and unlabored. Skin normal, warm and dry.

## 2024-11-14 NOTE — Telephone Encounter (Signed)
 FYI Only or Action Required?: FYI only for provider: ED advised.  Patient was last seen in primary care on 04/19/2024 by Norleen Lynwood ORN, MD.  Called Nurse Triage reporting Chest Pain.  Symptoms began several weeks ago.  Interventions attempted: OTC medications: arthritis , Prescription medications: muscle relaxer, and Rest, hydration, or home remedies.  Symptoms are: unchanged.  Triage Disposition: Go to ED Now (or PCP Triage)  Patient/caregiver understands and will follow disposition?: Yes   Copied from CRM #[id number]. Topic: Clinical - Red Word Triage >> Nov 14, 2024  2:12 PM Rosina BIRCH wrote: Red Word that prompted transfer to Nurse Triage: muscle pain on his left side in his peck and every time he yawn it hurts more. It is also tender to  touch on the inside and when he looks in the mirror it looks bigger.  It has been going on for a week n half Reason for Disposition  Taking a deep breath makes pain worse  Answer Assessment - Initial Assessment Questions Pt states he thinks he has a strained pec muscle on the left middle inside of pec. He states it is tender to touch, hurts more with breathing or yawning and looks swollen. He states pain has been ongoing for about 1.5 weeks. He states he was doing some chest press but not that heavy 235-240lbs. Pt states it does not radiate into his jaw, neck or arm. If he lifts his arm up it hurts. He states he just finished some muscle relaxers for his back while dealing with this pain and it hasn't helped. When asked if he is having shortness of breath he said hard to tell he hasn't been paying attention to that just the pain. It's a constant 5-6/10 but with any movement goes up to 8/10 then when resting back to 5-6. He states he can even feel it when he's walking.  Given that it is worse with a deep breath or yawn and hasn't gotten better with arthritis medication and muscle relaxer, RN advised pt to go too the ER to be evaluated. He asked about UC, Rn  gave rationale for why ER vs UC. He stated understanding and said he was going to get off work and then go.   1. LOCATION: Where does it hurt?       Left chest middle towards sternum 2. RADIATION: Does the pain go anywhere else? (e.g., into neck, jaw, arms, back)     no 3. ONSET: When did the chest pain begin? (Minutes, hours or days)      1.5 weeks maybe longer 4. PATTERN: Does the pain come and go, or has it been constant since it started?  Does it get worse with exertion?      Constant but intensity changes 5. DURATION: How long does it last (e.g., seconds, minutes, hours)     constant 6. SEVERITY: How bad is the pain?  (e.g., Scale 1-10; mild, moderate, or severe)     5-6 at rest 8 with movemtn  7. CARDIAC RISK FACTORS: Do you have any history of heart problems or risk factors for heart disease? (e.g., angina, prior heart attack; diabetes, high blood pressure, high cholesterol, smoker, or strong family history of heart disease)     Atherosclerosis, htn 8. PULMONARY RISK FACTORS: Do you have any history of lung disease?  (e.g., blood clots in lung, asthma, emphysema, birth control pills)     denies 9. CAUSE: What do you think is causing the chest pain?  unknown 10. OTHER SYMPTOMS: Do you have any other symptoms? (e.g., dizziness, nausea, vomiting, sweating, fever, difficulty breathing, cough)      States he hasn't paid attention to any shortness of breath  Protocols used: Chest Pain-A-AH

## 2024-11-15 NOTE — Telephone Encounter (Signed)
 Just a FYI the patient was seen in the ED yesterday.

## 2024-11-19 ENCOUNTER — Other Ambulatory Visit: Payer: Self-pay | Admitting: Urology

## 2024-12-02 NOTE — Progress Notes (Deleted)
 " Cardiology Office Note:    Date:  12/02/2024   ID:  Scott, [name] Apr 28, 1961, MRN [id number]  PCP:  Joshua Debby CROME, MD   [facility] Providers Cardiologist:  None { Click to update primary MD,subspecialty MD or APP then REFRESH:1}    Referring MD: Cottie Donnice PARAS, MD   No chief complaint on file. ***  History of Present Illness:    [name] is a 64 y.o. male is seen at the request of Dr Cottie for evaluation of chest pain. He has a history of OSA, HTN, HLD and tobacco abuse. He has a history of nonobstructive CAD last seen by cardiology in 2016. Cardiac Cath 2006 showed normal LM, 40 LAD, normal LCX, and normal RCA; EF 60. Nuclear study 2011 showed inferior thinning and no ischemia; EF 53. Nuclear study 1/16 showed EF 52 showed inferior attenuation and no ischemia.   Past Medical History:  Diagnosis Date   BPH (benign prostatic hyperplasia)    Cataract    Chronic rhinitis    Coronary atherosclerosis of unspecified type of vessel, native or graft    Dysmetabolic syndrome X    Erectile dysfunction    Hypertrophy of prostate with urinary obstruction and other lower urinary tract symptoms (LUTS)    Obstructive sleep apnea (adult) (pediatric)    no CPAP, has lost weight   Prediabetes    Primary hypertension 11/03/2021   Problems related to high-risk sexual behavior    Pure hypercholesterolemia    Sickle cell trait    Sleep apnea    Tobacco use disorder     Past Surgical History:  Procedure Laterality Date   COSMETIC SURGERY     HERNIA REPAIR     UHR  AT [facility]  <5 YRS AGO    INCISION AND DRAINAGE ABSCESS / HEMATOMA OF BURSA / KNEE / THIGH     I&D of complex abscess,left axilla. I&D of large infected pilonidal abscess   KNEE ARTHROSCOPY WITH MENISCAL REPAIR Right 02/08/2022   Procedure: RIGHT KNEE PARTIAL MEDIAL MENISCECTOMY;  Surgeon: Doll Skates, MD;  Location: [facility];  Service: Orthopedics;  Laterality: Right;   LAPAROSCOPIC  PARTIAL COLECTOMY N/A 03/10/2015   Procedure: LAPAROSCOPIC ASSISTED RIGHT COLECTOMY;  Surgeon: Donnice Bury, MD;  Location: [facility];  Service: General;  Laterality: N/A;   MASS EXCISION N/A 10/19/2015   Procedure: EXCISION BACK MASS;  Surgeon: Donnice Bury, MD;  Location: [facility];  Service: General;  Laterality: N/A;   PTCA  2010    Current Medications: Active Medications[1]   Allergies:   Penicillins   Social History   Socioeconomic History   Marital status: Divorced    Spouse name: Not on file   Number of children: 1   Years of education: Not on file   Highest education level: Not on file  Occupational History   Not on file  Tobacco Use   Smoking status: Some Days    Types: Cigars    Passive exposure: Current (on occasion)   Smokeless tobacco: Never  Vaping Use   Vaping status: Never Used  Substance and Sexual Activity   Alcohol use: Yes    Comment: pt states rare   Drug use: No   Sexual activity: Yes    Birth control/protection: Condom  Other Topics Concern   Not on file  Social History Narrative   Caffeine: 1 2- cups,  Education: grad HS, [employer] (does paramedic) multimedia programmer. Divorced,  one child,  3 grandkids.    Social Drivers of Health   Tobacco Use: High Risk (10/18/2024)   Patient History    Smoking Tobacco Use: Some Days    Smokeless Tobacco Use: Never    Passive Exposure: Current  Financial Resource Strain: Not on file  Food Insecurity: Not on file  Transportation Needs: Not on file  Physical Activity: Not on file  Stress: Not on file  Social Connections: Unknown (04/08/2022)   Received [facility]   Social Network    Social Network: Not on file  Depression (PHQ2-9): Low Risk (04/19/2024)   Depression (PHQ2-9)    PHQ-2 Score: 0  Alcohol Screen: Not on file  Housing: Not on file  Utilities: Not on file  Health Literacy: Not on file     Family History: The patient's ***family history includes Breast  cancer in his daughter and mother; Colon cancer in his maternal grandfather, maternal grandmother, and another family member; Heart attack in his father; Prostate cancer in his maternal grandfather; Stroke in his maternal grandmother. There is no history of Esophageal cancer, Stomach cancer, or Rectal cancer.  ROS:   Please see the history of present illness.    *** All other systems reviewed and are negative.  EKGs/Labs/Other Studies Reviewed:    The following studies were reviewed today: ***      Recent Labs: 01/12/2024: ALT 19; TSH 0.83 01/18/2024: Pro B Natriuretic peptide (BNP) 9.0 11/14/2024: BUN 14; Creatinine, Ser 1.11; Hemoglobin 14.4; Platelets 260; Potassium 3.8; Sodium 141  Recent Lipid Panel    Component Value Date/Time   CHOL 153 01/12/2024 1513   TRIG 196.0 (H) 01/12/2024 1513   HDL 43.10 01/12/2024 1513   CHOLHDL 4 01/12/2024 1513   VLDL 39.2 01/12/2024 1513   LDLCALC 70 01/12/2024 1513   LDLDIRECT 132.4 07/29/2008 0852     Risk Assessment/Calculations:   {Does this patient have ATRIAL FIBRILLATION?:[phone]}  No BP recorded.  {Refresh Note OR Click here to enter BP  :1}***         Physical Exam:    VS:  There were no vitals taken for this visit.    Wt Readings from Last 3 Encounters:  10/18/24 236 lb 6.4 oz (107.2 kg)  04/19/24 241 lb (109.3 kg)  04/15/24 234 lb (106.1 kg)     GEN: *** Well nourished, well developed in no acute distress HEENT: Normal NECK: No JVD; No carotid bruits LYMPHATICS: No lymphadenopathy CARDIAC: ***RRR, no murmurs, rubs, gallops RESPIRATORY:  Clear to auscultation without rales, wheezing or rhonchi  ABDOMEN: Soft, non-tender, non-distended MUSCULOSKELETAL:  No edema; No deformity  SKIN: Warm and dry NEUROLOGIC:  Alert and oriented x 3 PSYCHIATRIC:  Normal affect   ASSESSMENT:    No diagnosis found. PLAN:    In order of problems listed above:  ***      {Are you ordering a CV Procedure (e.g. stress test,  cath, DCCV, TEE, etc)?   Press F2        :[id number]}    Medication Adjustments/Labs and Tests Ordered: Current medicines are reviewed at length with the patient today.  Concerns regarding medicines are outlined above.  No orders of the defined types were placed in this encounter.  No orders of the defined types were placed in this encounter.   There are no Patient Instructions on file for this visit.   Signed, Mareta Chesnut, MD  12/02/2024 8:28 AM    [facility]     [1]  No outpatient medications have been marked as taking for the 12/04/24 encounter (Appointment) with Sullivan Jacuinde M, MD.   "

## 2024-12-04 ENCOUNTER — Ambulatory Visit: Attending: Cardiology | Admitting: Cardiology

## 2024-12-06 ENCOUNTER — Encounter: Payer: Self-pay | Admitting: Cardiology

## 2024-12-06 NOTE — Progress Notes (Signed)
 Date of COVID positive in last 90 days:  PCP - Debby Molt, MD Cardiologist - Redell Shallow, MD LOV 2016 for CP, pt did not f/u Neurologist- Greig Forbes, NP  Chest x-ray - N/A EKG - 11/15/24 Epic Stress Test - 12/09/14 Epic  ECHO - N/A Cardiac Cath - N/A Pacemaker/ICD device last checked:N/A Spinal Cord Stimulator:N/A  Bowel Prep - N/A  Sleep Study - yes  CPAP - yes, except for weekends  Fasting Blood Sugar - preDM Checks Blood Sugar _____ times a day  Last dose of GLP1 agonist-  N/A GLP1 instructions:  Do not take after     Last dose of SGLT-2 inhibitors-  N/A SGLT-2 instructions:  Do not take after     Blood Thinner Instructions: N/A Last dose:   Time: Aspirin  Instructions: ASA 81, hold 5 days Last Dose:  Activity level: Can go up a flight of stairs and perform activities of daily living without stopping and without symptoms of chest pain or shortness of breath.   Anesthesia review: coronary atherosclerosis, HTN, OSA, in ED for CP 11/14/24  Patient denies shortness of breath, fever, cough and chest pain at PAT appointment  Patient verbalized understanding of instructions that were given to them at the PAT appointment. Patient was also instructed that they will need to review over the PAT instructions again at home before surgery.

## 2024-12-06 NOTE — Patient Instructions (Signed)
 SURGICAL WAITING ROOM VISITATION  Patients having surgery or a procedure may have no more than 2 support people in the waiting area - these visitors may rotate.    Children ages 63 and under will not be able to visit patients in [facility] under most circumstances.   Visitors with respiratory illnesses are discouraged from visiting and should remain at home.  If the patient needs to stay at the hospital during part of their recovery, the visitor guidelines for inpatient rooms apply. Pre-op nurse will coordinate an appropriate time for 1 support person to accompany patient in pre-op.  This support person may not rotate.    Please refer to the [facility] website for the visitor guidelines for Inpatients (after your surgery is over and you are in a regular room).    Your procedure is scheduled on: 12/20/24   Report to [facility] Main Entrance    Report to admitting at 7:45 AM   Call this number if you have problems the morning of surgery [phone]   Do not eat food or drink liquids :After Midnight.          If you have questions, please contact your surgeons office.   FOLLOW BOWEL PREP AND ANY ADDITIONAL PRE OP INSTRUCTIONS YOU RECEIVED FROM YOUR SURGEON'S OFFICE!!!     Oral Hygiene is also important to reduce your risk of infection.                                    Remember - BRUSH YOUR TEETH THE MORNING OF SURGERY WITH YOUR REGULAR TOOTHPASTE  DENTURES WILL BE REMOVED PRIOR TO SURGERY PLEASE DO NOT APPLY Poly grip OR ADHESIVES!!!   Do NOT smoke after Midnight   Stop all vitamins and herbal supplements 7 days before surgery.   Take these medicines the morning of surgery with A SIP OF WATER: None   DO NOT TAKE ANY ORAL DIABETIC MEDICATIONS DAY OF YOUR SURGERY  Bring CPAP mask and tubing day of surgery.                              You may not have any metal on your body including jewelry, and body piercing             Do not wear lotions,  powders, cologne, or deodorant              Men may shave face and neck.   Do not bring valuables to the hospital. [facility] IS NOT             RESPONSIBLE   FOR VALUABLES.   Contacts, glasses, dentures or bridgework may not be worn into surgery.  DO NOT BRING YOUR HOME MEDICATIONS TO THE HOSPITAL. PHARMACY WILL DISPENSE MEDICATIONS LISTED ON YOUR MEDICATION LIST TO YOU DURING YOUR ADMISSION IN THE HOSPITAL!    Patients discharged on the day of surgery will not be allowed to drive home.  Someone NEEDS to stay with you for the first 24 hours after anesthesia.   Special Instructions: Bring a copy of your healthcare power of attorney and living will documents the day of surgery if you haven't scanned them before.              Please read over the following fact sheets you were given: IF YOU HAVE QUESTIONS ABOUT YOUR PRE-OP  INSTRUCTIONS PLEASE CALL [phone] [address] [place].   If you received a COVID test during your pre-op visit  it is requested that you wear a mask when out in public, stay away from anyone that may not be feeling well and notify your surgeon if you develop symptoms. If you test positive for Covid or have been in contact with anyone that has tested positive in the last 10 days please notify you surgeon.    [place] - Preparing for Surgery Before surgery, you can play an important role.  Because skin is not sterile, your skin needs to be as free of germs as possible.  You can reduce the number of germs on your skin by washing with CHG (chlorahexidine gluconate) soap before surgery.  CHG is an antiseptic cleaner which kills germs and bonds with the skin to continue killing germs even after washing. Please DO NOT use if you have an allergy to CHG or antibacterial soaps.  If your skin becomes reddened/irritated stop using the CHG and inform your nurse when you arrive at Short Stay. Do not shave (including legs and underarms) for at least 48 hours prior to the first CHG shower.  You  may shave your face/neck.  Please follow these instructions carefully:  1.  Shower with CHG Soap the night before surgery ONLY (DO NOT USE THE SOAP THE MORNING OF SURGERY).  2.  If you choose to wash your hair, wash your hair first as usual with your normal  shampoo.  3.  After you shampoo, rinse your hair and body thoroughly to remove the shampoo.                             4.  Use CHG as you would any other liquid soap.  You can apply chg directly to the skin and wash.  Gently with a scrungie or clean washcloth.  5.  Apply the CHG Soap to your body ONLY FROM THE NECK DOWN.   Do   not use on face/ open                           Wound or open sores. Avoid contact with eyes, ears mouth and   genitals (private parts).                       Wash face,  Genitals (private parts) with your normal soap.             6.  Wash thoroughly, paying special attention to the area where your    surgery  will be performed.  7.  Thoroughly rinse your body with warm water from the neck down.  8.  DO NOT shower/wash with your normal soap after using and rinsing off the CHG Soap.                9.  Pat yourself dry with a clean towel.            10.  Wear clean pajamas.            11.  Place clean sheets on your bed the night of your first shower and do not  sleep with pets. Day of Surgery : Do not apply any CHG, lotions/deodorants the morning of surgery.  Please wear clean clothes to the hospital/surgery center.  FAILURE TO FOLLOW THESE INSTRUCTIONS MAY RESULT IN THE CANCELLATION OF  YOUR SURGERY  PATIENT SIGNATURE_________________________________  NURSE SIGNATURE__________________________________  ________________________________________________________________________

## 2024-12-09 ENCOUNTER — Encounter (HOSPITAL_COMMUNITY): Payer: Self-pay

## 2024-12-09 ENCOUNTER — Other Ambulatory Visit: Payer: Self-pay

## 2024-12-09 ENCOUNTER — Encounter (HOSPITAL_COMMUNITY)
Admission: RE | Admit: 2024-12-09 | Discharge: 2024-12-09 | Disposition: A | Source: Ambulatory Visit | Attending: Urology

## 2024-12-09 VITALS — BP 137/101 | HR 71 | Temp 98.5°F | Resp 16 | Ht 72.0 in | Wt 235.0 lb

## 2024-12-09 DIAGNOSIS — Z01812 Encounter for preprocedural laboratory examination: Secondary | ICD-10-CM | POA: Diagnosis present

## 2024-12-09 DIAGNOSIS — I10 Essential (primary) hypertension: Secondary | ICD-10-CM | POA: Diagnosis not present

## 2024-12-09 HISTORY — DX: Unspecified osteoarthritis, unspecified site: M19.90

## 2024-12-09 HISTORY — DX: Diverticulitis of intestine, part unspecified, without perforation or abscess without bleeding: K57.92

## 2024-12-09 LAB — BASIC METABOLIC PANEL WITH GFR
Anion gap: 10 (ref 5–15)
BUN: 10 mg/dL (ref 8–23)
CO2: 27 mmol/L (ref 22–32)
Calcium: 8.8 mg/dL — ABNORMAL LOW (ref 8.9–10.3)
Chloride: 105 mmol/L (ref 98–111)
Creatinine, Ser: 1.08 mg/dL (ref 0.61–1.24)
GFR, Estimated: >60 mL/min
Glucose, Bld: 121 mg/dL — ABNORMAL HIGH (ref 70–99)
Potassium: 3.9 mmol/L (ref 3.5–5.1)
Sodium: 142 mmol/L (ref 135–145)

## 2024-12-09 LAB — CBC
HCT: 42.6 % (ref 39.0–52.0)
Hemoglobin: 14.4 g/dL (ref 13.0–17.0)
MCH: 28.4 pg (ref 26.0–34.0)
MCHC: 33.8 g/dL (ref 30.0–36.0)
MCV: 84 fL (ref 80.0–100.0)
Platelets: 230 K/uL (ref 150–400)
RBC: 5.07 MIL/uL (ref 4.22–5.81)
RDW: 12.5 % (ref 11.5–15.5)
WBC: 5.3 K/uL (ref 4.0–10.5)
nRBC: 0 % (ref 0.0–0.2)

## 2024-12-10 NOTE — H&P (Addendum)
 H&P update: See scanned H&P. [name] has presented for robotic water  jet ablation of the prostate. I discussed with the patient the nature, potential benefits, risks and alternatives to robotic water  jet ablation of the prostate, including side effects of the proposed treatment, the likelihood of the patient achieving the goals of the procedure, and any potential problems that might occur during the procedure or recuperation.  He would like to try and preserve ejaculation.  He ask if the procedure would improve erectile function and we discussed it would not.  But the goal is to not decrease erectile function, bleeding, incontinence, infection among other risks.  All questions answered. Patient elects to proceed.  He has been well without dysuria or gross hematuria.  No fever or congestion.  No changes to H&P.  Patient stable for surgery.

## 2024-12-11 ENCOUNTER — Telehealth (HOSPITAL_BASED_OUTPATIENT_CLINIC_OR_DEPARTMENT_OTHER): Payer: Self-pay | Admitting: *Deleted

## 2024-12-11 ENCOUNTER — Ambulatory Visit: Attending: Cardiology | Admitting: Cardiology

## 2024-12-11 ENCOUNTER — Encounter: Payer: Self-pay | Admitting: Cardiology

## 2024-12-11 VITALS — BP 122/90 | HR 96 | Resp 16 | Ht 72.0 in | Wt 236.4 lb

## 2024-12-11 DIAGNOSIS — F17210 Nicotine dependence, cigarettes, uncomplicated: Secondary | ICD-10-CM

## 2024-12-11 DIAGNOSIS — E782 Mixed hyperlipidemia: Secondary | ICD-10-CM | POA: Diagnosis not present

## 2024-12-11 DIAGNOSIS — G4730 Sleep apnea, unspecified: Secondary | ICD-10-CM

## 2024-12-11 DIAGNOSIS — R072 Precordial pain: Secondary | ICD-10-CM

## 2024-12-11 DIAGNOSIS — Z01810 Encounter for preprocedural cardiovascular examination: Secondary | ICD-10-CM

## 2024-12-11 DIAGNOSIS — I10 Essential (primary) hypertension: Secondary | ICD-10-CM | POA: Diagnosis not present

## 2024-12-11 DIAGNOSIS — I2510 Atherosclerotic heart disease of native coronary artery without angina pectoris: Secondary | ICD-10-CM

## 2024-12-11 NOTE — Telephone Encounter (Signed)
 Our office received preop request, 5 pages. Though in the 5 pages the preop request did not state what the procedure is, etc. I called Dr. Nieves office and confirmed procedure with Powell, Triage RN.   I did state the pt has a new pt appt with Dr. Michele today. Once the pt has been cleared by MD, they will be sure to have their nurse or their CMA fax notes.      Pre-operative Risk Assessment    Patient Name: [name]  DOB: 1961/11/27 MRN: [id number]   Date of last office visit: NONE Date of next office visit: 12/11/24 DR. TOLIA-NEW PT APPT    Request for Surgical Clearance    Procedure:  ABLATION, PROSTATE, TRANSURETHRAL USING WATER JET  Date of Surgery:  Clearance TBD                                Surgeon:  DR. MATTHEW ESKRIDGE Surgeon's Group or Practice Name:  [facility] Phone number:  [phone] Fax number:  [phone]   Type of Clearance Requested:   - Medical  - Pharmacy:  [facility]      Type of Anesthesia:  General    Additional requests/questions:    Bonney Niels Jest   12/11/2024, 9:31 AM

## 2024-12-11 NOTE — Patient Instructions (Signed)
 Medication Instructions:  Your physician recommends that you continue on your current medications as directed. Please refer to the Current Medication list given to you today.  *If you need a refill on your cardiac medications before your next appointment, please call your pharmacy*  Lab Work: None ordered If you have labs (blood work) drawn today and your tests are completely normal, you will receive your results only by: MyChart Message (if you have MyChart) OR A paper copy in the mail If you have any lab test that is abnormal or we need to change your treatment, we will call you to review the results.  Testing/Procedures: Echocardiogram  Exercise Tolerance Test  Follow-Up: At [facility], you and your health needs are our priority.  As part of our continuing mission to provide you with exceptional heart care, our providers are all part of one team.  This team includes your primary Cardiologist (physician) and Advanced Practice Providers or APPs (Physician Assistants and Nurse Practitioners) who all work together to provide you with the care you need, when you need it.  Your next appointment:   1 year(s)  Provider:   Madonna Large, DO    We recommend signing up for the patient portal called MyChart.  Sign up information is provided on this After Visit Summary.  MyChart is used to connect with patients for Virtual Visits (Telemedicine).  Patients are able to view lab/test results, encounter notes, upcoming appointments, etc.  Non-urgent messages can be sent to your provider as well.   To learn more about what you can do with MyChart, go to [email or web address].   Other Instructions Your physician has requested that you have an echocardiogram. Echocardiography is a painless test that uses sound waves to create images of your heart. It provides your doctor with information about the size and shape of your heart and how well your hearts chambers and valves are working. This  procedure takes approximately one hour. There are no restrictions for this procedure. Please do NOT wear cologne, perfume, aftershave, or lotions (deodorant is allowed). Please arrive 15 minutes prior to your appointment time.  Please note: We ask at that you not bring children with you during ultrasound (echo/ vascular) testing. Due to room size and safety concerns, children are not allowed in the ultrasound rooms during exams. Our front office staff cannot provide observation of children in our lobby area while testing is being conducted. An adult accompanying a patient to their appointment will only be allowed in the ultrasound room at the discretion of the ultrasound technician under special circumstances. We apologize for any inconvenience.   - DO NOT EAT, DRINK, OR USE TOBACCO PRODUCTS WITHIN 4 HOURS OF THE TEST - DRESS PREPARED TO EXERCISE IN A COMFORTABLE, 2 PIECE CLOTHING OUTFIT AND WALKING SHOES - BRING ANY PRESCRIPTION MEDICATIONS WITH YOU  - NOTIFY THE OFFICE 24 HOURS IN ADVANCE IF YOU NEED TO CANCEL - CALL THE OFFICE [phone] IF YOU HAVE ANY QUESTIONS

## 2024-12-11 NOTE — Progress Notes (Signed)
 "   Cardiology Office Note:    Date:  12/11/2024  NAME:  [name]    MRN: [id number] DOB:  12-16-1960   PCP:  Joshua Debby CROME, MD  Former Cardiology Providers: Dr. Pietro Primary Cardiologist:  Madonna Large, DO, [facility] (established care 12/11/2024) Electrophysiologist:  None   Referring MD: Joshua Debby CROME, MD  Reason of Consult: Preop risk stratification  Chief Complaint  Patient presents with   Chest Pain   Pre-op Exam   New Patient (Initial Visit)    History of Present Illness:    [name] is a 64 y.o. African-American male whose past medical history and cardiovascular risk factors includes: Nonobstructive CAD, hyperlipidemia, cigarette smoker, sleep apnea on CPAP , erectile dysfunction,.   He is being seen today for the evaluation of chest pain at the request of preop/anesthesia.  In the past patient was seen by Dr. Pietro, last office visit October 2016.  Reviewed the last progress note which notes he underwent left heart catheterization in 2006 which noted nonobstructive disease in the LAD.  Patient presents today for preoperative risk assessment as he is being considered for ablation/prostate/transurethral using water jet under the care of Dr. Nieves with [facility].  Date of surgery is to be determined.  Requesting provider is requesting clearance and assistance with aspirin  management (hold aspirin  for 5 days) and type of anesthesia being considered  is general.  Given his recent ED visit in December 2025 for chest pain he was asked to follow-up with cardiology prior to scheduling surgery.  Chest Pain In December 2025 patient stated that he was working out at [employer] and was lifting heavier weights than his usual regimen.  Several days later he started experiencing chest pain without any significant improvement and therefore goes to the ED on 11/14/2024 for evaluation.  High sensitive troponins were negative x 1.  CT dissection protocol negative for acute  coronary syndrome but notable for mild aortic atherosclerosis.  He was asked to follow-up as outpatient but symptoms thereafter resolved.  Now he is being considered for surgery and anesthesia recommended cardiology preoperative risk assessment prior to general anesthesia.  He denies anginal chest pain or heart failure symptoms.  Patient works out at [employer] and does cardiovascular workouts 30 minutes 3 times a week on average.  He is supposed to be on statin therapy for hyperlipidemia but has stopped taking it.  He continues to smoke with plans of this quitting and he is down to couple cigarettes per day.  In February 2025 when he followed up with his PCP he was recommended to undergo coronary CTA due to concerns for shortness of breath with effort related activities for reasons unknown patient has not completed testing.  Current Medications: Active Medications[1]   Allergies:    Penicillins   Past Medical History: Past Medical History:  Diagnosis Date   Arthritis    BPH (benign prostatic hyperplasia)    Cataract    Chronic rhinitis    Complication of anesthesia    patient stated was told high tolerance last time   Coronary atherosclerosis of unspecified type of vessel, native or graft    Diverticulitis    Dysmetabolic syndrome X    Erectile dysfunction    Hypertrophy of prostate with urinary obstruction and other lower urinary tract symptoms (LUTS)    Obstructive sleep apnea (adult) (pediatric)    no CPAP, has lost weight   Prediabetes    Primary hypertension 11/03/2021  Problems related to high-risk sexual behavior    Pure hypercholesterolemia    Sickle cell trait    Sleep apnea    Tobacco use disorder     Past Surgical History: Past Surgical History:  Procedure Laterality Date   CATARACT EXTRACTION Bilateral    COSMETIC SURGERY     HERNIA REPAIR     UHR  AT [facility]  <5 YRS AGO    INCISION AND DRAINAGE ABSCESS / HEMATOMA OF BURSA / KNEE / THIGH     I&D of complex  abscess,left axilla. I&D of large infected pilonidal abscess   KNEE ARTHROSCOPY WITH MENISCAL REPAIR Right 02/08/2022   Procedure: RIGHT KNEE PARTIAL MEDIAL MENISCECTOMY;  Surgeon: Doll Skates, MD;  Location: [facility];  Service: Orthopedics;  Laterality: Right;   LAPAROSCOPIC PARTIAL COLECTOMY N/A 03/10/2015   Procedure: LAPAROSCOPIC ASSISTED RIGHT COLECTOMY;  Surgeon: Donnice Bury, MD;  Location: [facility];  Service: General;  Laterality: N/A;   MASS EXCISION N/A 10/19/2015   Procedure: EXCISION BACK MASS;  Surgeon: Donnice Bury, MD;  Location: [facility];  Service: General;  Laterality: N/A;   PTCA  11/28/2008    Social History: Social History[2]  Family History: Family History  Problem Relation Age of Onset   Breast cancer Mother    Heart attack Father        Over 11   Colon cancer Maternal Grandmother    Stroke Maternal Grandmother        Over 64   Colon cancer Maternal Grandfather    Prostate cancer Maternal Grandfather    Breast cancer Daughter    Colon cancer Other        1st degree relative<60   Esophageal cancer Neg Hx    Stomach cancer Neg Hx    Rectal cancer Neg Hx     ROS:   Review of Systems  Cardiovascular:  Negative for chest pain, claudication, irregular heartbeat, leg swelling, near-syncope, orthopnea, palpitations, paroxysmal nocturnal dyspnea and syncope.  Respiratory:  Negative for shortness of breath.   Hematologic/Lymphatic: Negative for bleeding problem.    Studies Reviewed:   EKG: EKG Interpretation Date/Time:  Wednesday December 11 2024 15:30:44 EST Ventricular Rate:  80 PR Interval:  182 QRS Duration:  82 QT Interval:  360 QTC Calculation: 415 R Axis:   32  Text Interpretation: Normal sinus rhythm Normal ECG When compared with ECG of 14-Nov-2024 15:55, No significant change was found Confirmed by Michele Richardson [phone]) on 12/11/2024 3:35:34 PM  Echocardiogram: Will order  Stress Testing:  Will  order  Labs:    Latest Ref Rng & Units 12/09/2024   11:18 AM 11/14/2024    4:22 PM 01/12/2024    3:13 PM  CBC  WBC 4.0 - 10.5 K/uL 5.3  5.6  5.3   Hemoglobin 13.0 - 17.0 g/dL 85.5  85.5  85.5   Hematocrit 39.0 - 52.0 % 42.6  41.9  43.4   Platelets 150 - 400 K/uL 230  260  255.0        Latest Ref Rng & Units 12/09/2024   11:18 AM 11/14/2024    4:22 PM 01/12/2024    3:13 PM  BMP  Glucose 70 - 99 mg/dL 878  86  88   BUN 8 - 23 mg/dL 10  14  14    Creatinine 0.61 - 1.24 mg/dL 8.91  8.88  8.70   Sodium 135 - 145 mmol/L 142  141  140   Potassium 3.5 - 5.1 mmol/L  3.9  3.8  4.0   Chloride 98 - 111 mmol/L 105  108  104   CO2 22 - 32 mmol/L 27  23  29    Calcium  8.9 - 10.3 mg/dL 8.8  8.8  8.6       Latest Ref Rng & Units 12/09/2024   11:18 AM 11/14/2024    4:22 PM 01/12/2024    3:13 PM  CMP  Glucose 70 - 99 mg/dL 878  86  88   BUN 8 - 23 mg/dL 10  14  14    Creatinine 0.61 - 1.24 mg/dL 8.91  8.88  8.70   Sodium 135 - 145 mmol/L 142  141  140   Potassium 3.5 - 5.1 mmol/L 3.9  3.8  4.0   Chloride 98 - 111 mmol/L 105  108  104   CO2 22 - 32 mmol/L 27  23  29    Calcium  8.9 - 10.3 mg/dL 8.8  8.8  8.6   Total Protein 6.0 - 8.3 g/dL   6.7   Total Bilirubin 0.2 - 1.2 mg/dL   0.5   Alkaline Phos 39 - 117 U/L   87   AST 0 - 37 U/L   17   ALT 0 - 53 U/L   19     Lab Results  Component Value Date   CHOL 153 01/12/2024   HDL 43.10 01/12/2024   LDLCALC 70 01/12/2024   LDLDIRECT 132.4 07/29/2008   TRIG 196.0 (H) 01/12/2024   CHOLHDL 4 01/12/2024   No results for input(s): LIPOA in the last 8760 hours. No components found for: NTPROBNP Recent Labs    01/18/24 1604  PROBNP 9.0   Recent Labs    01/12/24 1513  TSH 0.83    Physical Exam:    Today's Vitals   12/11/24 1528  BP: (!) 122/90  Pulse: 96  Resp: 16  SpO2: 96%  Weight: 236 lb 6.4 oz (107.2 kg)  Height: 6' (1.829 m)   Body mass index is 32.06 kg/m. Wt Readings from Last 3 Encounters:  12/11/24 236 lb 6.4 oz  (107.2 kg)  12/09/24 235 lb (106.6 kg)  10/18/24 236 lb 6.4 oz (107.2 kg)    Physical Exam  Constitutional: No distress.  hemodynamically stable  Neck: No JVD present.  Cardiovascular: Normal rate, regular rhythm, S1 normal and S2 normal. Exam reveals no gallop, no S3 and no S4.  No murmur heard. Pulmonary/Chest: Effort normal and breath sounds normal. No stridor. He has no wheezes. He has no rales.  Musculoskeletal:        General: No edema.     Cervical back: Neck supple.  Skin: Skin is warm.     Impression & Recommendation(s):  Impression:   ICD-10-CM   1. Precordial pain  R07.2 EKG 12-Lead    Exercise Tolerance Test    ECHOCARDIOGRAM COMPLETE    2. Nonobstructive atherosclerosis of coronary artery  I25.10     3. Preop cardiovascular exam  Z01.810 Exercise Tolerance Test    ECHOCARDIOGRAM COMPLETE    4. Primary hypertension  I10     5. Mixed hyperlipidemia  E78.2     6. Episodic cigarette smoking dependence  F17.210     7. Sleep apnea in adult  G47.30        Recommendation(s):  Precordial pain Nonobstructive atherosclerosis of coronary artery Precordial pain predominately noncardiac based on symptoms. EKG is nonischemic. History of nonobstructive coronary artery disease with no recent follow-up or testing. In February 2025  he was endorsing shortness of breath with exertion for which PCP ordered coronary CTA but patient has not completed it for reasons unknown, he had an ER visit in December for chest pain, and has risk factors of cigarette smoking, hyperlipidemia (stopped taking medications). Now anesthesia has referred him to cardiology for preoperative risk assessment. Echo will be ordered to evaluate for structural heart disease and left ventricular systolic function. Exercise treadmill stress test As long as the echocardiogram notes preserved LVEF and no significant valvular heart disease and GXT is low risk no additional testing will be warranted.  Upon  completion of the tests preop letter will be provided to the patient. Patient is advised to hold aspirin  for 5 to 7 days prior to his surgery as per anesthesia recommendations and to restart once cleared by his surgeon. Reemphasized importance of complete smoking cessation and improving his modifiable cardiovascular risk factors  Preop cardiovascular exam Forthcoming upon completion of test results  Primary hypertension Office blood pressures are acceptable, diastolic blood pressure is above goal Follow-up with PCP  Mixed hyperlipidemia Lipids were well-controlled while he was on rosuvastatin . For reasons of note he stopped taking lipid-lowering agents Patient has upcoming yearly physical with PCP-recommend having his lipids checked and to restart medical therapy as clinically warranted.  Episodic cigarette smoking dependence Reemphasized importance of complete cessation of smoking  Sleep apnea in adult Patient endorses compliance with CPAP.   Orders Placed:  Orders Placed This Encounter  Procedures   Exercise Tolerance Test    Standing Status:   Future    Expected Date:   12/18/2024    Expiration Date:   12/11/2025    Where [facility] this [facility] performed[facility]    Stress with pharmacologic or treadmill ?:   Treadmill w/ exercise   EKG 12-Lead   ECHOCARDIOGRAM COMPLETE    Standing Status:   Future    Expected Date:   12/18/2024    Expiration Date:   12/11/2025    Where should this test be performed:   [facility]    Does the patient weigh less than or greater than 250 lbs?:   Patient weighs less than 250 lbs    Perflutren DEFINITY (image enhancing agent) should be administered unless hypersensitivity or allergy exist:   Administer Perflutren    Reason for exam-Echo:   Other-Full Diagnosis List    Full ICD-10/Reason for Exam:   Preop cardiovascular exam [[id number]]     Final Medication List:   No orders of the defined types were placed in this  encounter.   There are no discontinued medications.  Current Medications[3]  Consent:   Informed Consent   Shared Decision Making/Informed Consent The risks [chest pain, shortness of breath, cardiac arrhythmias, dizziness, blood pressure fluctuations, myocardial infarction, stroke/transient ischemic attack, and life-threatening complications (estimated to be 1 in 10,000)], benefits (risk stratification, diagnosing coronary artery disease, treatment guidance) and alternatives of an exercise tolerance test were discussed in detail with [name] and he agrees to proceed.     Disposition:   1 year follow-up sooner if needed  Signed, Madonna Large, DO, [facility]  A Division of [facility] [address], [place], [place] [address]      [1]  Current Meds  Medication Sig   aspirin  EC 81 MG tablet Take 1 tablet (81 mg total) by mouth daily. Swallow whole.   fluticasone  (FLONASE ) 50 MCG/ACT nasal spray SPRAY 2 SPRAYS  INTO EACH NOSTRIL EVERY DAY (Patient taking differently: Place 2 sprays into both nostrils daily as needed for allergies.)   lubiprostone  (AMITIZA ) 24 MCG capsule Take 1 capsule (24 mcg total) by mouth 2 (two) times daily with a meal.   Multiple Vitamin (MULTI-VITAMIN) tablet Take 1 tablet by mouth daily.   OVER THE COUNTER MEDICATION Take 1 Dose by mouth daily. [place]   tadalafil  (CIALIS ) 5 MG tablet Take 1 tablet (5 mg total) by mouth daily. (Patient taking differently: Take 5 mg by mouth daily as needed for erectile dysfunction.)  [2]  Social History Tobacco Use   Smoking status: Every Day    Types: Cigars    Passive exposure: Current (on occasion)   Smokeless tobacco: Never  Vaping Use   Vaping status: Never Used  Substance Use Topics   Alcohol use: Yes    Comment: pt states rare   Drug use: No  [3]  Current Outpatient Medications:    aspirin  EC 81 MG tablet, Take 1 tablet (81 mg total) by mouth daily. Swallow whole., Disp: 90  tablet, Rfl: 1   fluticasone  (FLONASE ) 50 MCG/ACT nasal spray, SPRAY 2 SPRAYS INTO EACH NOSTRIL EVERY DAY (Patient taking differently: Place 2 sprays into both nostrils daily as needed for allergies.), Disp: 48 mL, Rfl: 1   lubiprostone  (AMITIZA ) 24 MCG capsule, Take 1 capsule (24 mcg total) by mouth 2 (two) times daily with a meal., Disp: 180 capsule, Rfl: 1   Multiple Vitamin (MULTI-VITAMIN) tablet, Take 1 tablet by mouth daily., Disp: , Rfl:    OVER THE COUNTER MEDICATION, Take 1 Dose by mouth daily. [place], Disp: , Rfl:    tadalafil  (CIALIS ) 5 MG tablet, Take 1 tablet (5 mg total) by mouth daily. (Patient taking differently: Take 5 mg by mouth daily as needed for erectile dysfunction.), Disp: 10 tablet, Rfl: 1  "

## 2024-12-13 ENCOUNTER — Ambulatory Visit (HOSPITAL_COMMUNITY)
Admission: RE | Admit: 2024-12-13 | Discharge: 2024-12-13 | Disposition: A | Discharge status: Home / Self Care | Source: Ambulatory Visit | Attending: Cardiology | Admitting: Cardiology

## 2024-12-13 ENCOUNTER — Telehealth: Payer: Self-pay | Admitting: Cardiology

## 2024-12-13 ENCOUNTER — Other Ambulatory Visit: Payer: Self-pay | Admitting: Urology

## 2024-12-13 ENCOUNTER — Ambulatory Visit (HOSPITAL_COMMUNITY)

## 2024-12-13 DIAGNOSIS — Z01810 Encounter for preprocedural cardiovascular examination: Secondary | ICD-10-CM | POA: Diagnosis present

## 2024-12-13 DIAGNOSIS — R072 Precordial pain: Secondary | ICD-10-CM

## 2024-12-13 LAB — EXERCISE TOLERANCE TEST
Angina Index: 0
Duke Treadmill Score: 10
Estimated workload: 10.9
Exercise duration (min): 9 min
Exercise duration (sec): 30 s
MPHR: 159 {beats}/min
Peak HR: 146 {beats}/min
Percent HR: 91 %
RPE: 18
Rest HR: 81 {beats}/min
ST Depression (mm): 0 mm

## 2024-12-13 LAB — ECHOCARDIOGRAM COMPLETE
Area-P 1/2: 3.66 cm2
Calc EF: 50.6 %
S' Lateral: 3.5 cm
Single Plane A2C EF: 55.3 %
Single Plane A4C EF: 45.1 %

## 2024-12-13 NOTE — Telephone Encounter (Signed)
 Goodrich, Callie E, PA-C    12/13/24 12:56 PM Note Based off Dr. Tyree recent note, it looks like he is planning on sending a preop letting after completion of tests and after he has reviewed the results.    Thank you!      12/13/24 11:30 AM You routed this conversation to [facility] Preop   Me    12/13/24 11:30 AM Note I will send this to preop APP to see the notes from requesting office today.        12/13/24  9:11 AM Verdon Manifold M routed this conversation to [facility] Preop Callback   Verdon Manifold [facility]   12/13/24  9:11 AM Note Shona from [facility] wants know if she can clear the patient after he has Echo/ Stress Test today. She would like  c/b regarding this matter. Please advise       [facility] to Verdon Manifold [facility]    12/13/24  9:10 AM Ext. [phone]

## 2024-12-13 NOTE — Telephone Encounter (Signed)
 Based off Dr. Tyree recent note, it looks like he is planning on sending a preop letting after completion of tests and after he has reviewed the results.   Thank you!

## 2024-12-13 NOTE — Telephone Encounter (Signed)
 Rhonda from [facility] wants know if she can clear the patient after he has Echo/ Stress Test today. She would like  c/b regarding this matter. Please advise

## 2024-12-13 NOTE — Telephone Encounter (Signed)
 I will send this to preop APP to see the notes from requesting office today.

## 2024-12-16 ENCOUNTER — Ambulatory Visit: Payer: Self-pay | Admitting: Cardiology

## 2024-12-16 ENCOUNTER — Encounter: Payer: Self-pay | Admitting: Cardiology

## 2024-12-16 ENCOUNTER — Ambulatory Visit

## 2024-12-19 ENCOUNTER — Encounter (HOSPITAL_COMMUNITY): Payer: Self-pay

## 2024-12-19 NOTE — Anesthesia Preprocedure Evaluation (Signed)
"                                    Anesthesia Evaluation  Patient identified by MRN, date of birth, ID band Patient awake    Reviewed: Allergy & Precautions, NPO status , Patient's Chart, lab work & pertinent test results, reviewed documented beta blocker date and time   History of Anesthesia Complications (+) history of anesthetic complications  Airway Mallampati: II  TM Distance: >3 FB     Dental  (+) Upper Dentures   Pulmonary sleep apnea and Continuous Positive Airway Pressure Ventilation , neg COPD, Current Smoker and Patient abstained from smoking.   breath sounds clear to auscultation       Cardiovascular hypertension, + CAD  (-) Past MI, (-) Cardiac Stents and (-) CABG  Rhythm:Regular Rate:Normal  IMPRESSIONS     1. Left ventricular ejection fraction, by estimation, is 50 to 55%. Left  ventricular ejection fraction by 2D MOD biplane is 50.6 %. The left  ventricle has low normal function. The left ventricle has no regional wall  motion abnormalities. Left  ventricular diastolic parameters were normal. The average left ventricular  global longitudinal strain is -16.7 %. The global longitudinal strain is  abnormal.   2. Right ventricular systolic function is normal. The right ventricular  size is normal.   3. The mitral valve is normal in structure. No evidence of mitral valve  regurgitation. No evidence of mitral stenosis.   4. The aortic valve is normal in structure. Aortic valve regurgitation is  not visualized. No aortic stenosis is present.   5. The inferior vena cava is normal in size with <50% respiratory  variability, suggesting right atrial pressure of 8 mmHg.     Neuro/Psych neg Seizures    GI/Hepatic ,GERD  ,,(+) neg Cirrhosis        Endo/Other    Renal/GU Renal disease     Musculoskeletal  (+) Arthritis ,    Abdominal   Peds  Hematology   Anesthesia Other Findings   Reproductive/Obstetrics                               Anesthesia Physical Anesthesia Plan  ASA: 2  Anesthesia Plan: General   Post-op Pain Management:    Induction: Intravenous  PONV Risk Score and Plan: 1 and Ondansetron  and Dexamethasone   Airway Management Planned: Oral ETT  Additional Equipment:   Intra-op Plan:   Post-operative Plan: Extubation in OR  Informed Consent: I have reviewed the patients History and Physical, chart, labs and discussed the procedure including the risks, benefits and alternatives for the proposed anesthesia with the patient or authorized representative who has indicated his/her understanding and acceptance.     Dental advisory given  Plan Discussed with: CRNA  Anesthesia Plan Comments: (See PAT note from 1/12)         Anesthesia Quick Evaluation  "

## 2024-12-19 NOTE — Progress Notes (Signed)
 Unable to speak with patient via telephone to update him of his arrival time for surgery. I left a voicemail and sent updated instructions through Mr. emanuele [email or web address].

## 2024-12-19 NOTE — Progress Notes (Signed)
 " Case: [id number] Date/Time: 12/20/24 1145   Procedure: ABLATION, PROSTATE, TRANSURETHRAL, USING WATERJET   Anesthesia type: General   Diagnosis: Enlarged prostate with urinary obstruction [N40.1, N13.8]   Pre-op diagnosis: BENIGN PROSTATIC HYPERPLASIA   Location: [facility] / [facility]   Surgeons: Nieves Cough, MD       DISCUSSION: [name] is a 64 yo male with PMH of current smoking, HTN, nonobstructive CAD, OSA (uses CPAP), arthritis, BPH  Patient with hx of nonobstructive CAD and does not regularly follow with Cardiology. Patient was seen in the ED for chest pain on 11/14/24 and was referred for cardiac clearance. He was seen by Dr. Michele on 12/11/24. He reported no recurrence of the chest pain that he had in December. He was advised to undergo a stress test and Echo. Per Dr. Michele:  Precordial pain Nonobstructive atherosclerosis of coronary artery Precordial pain predominately noncardiac based on symptoms. EKG is nonischemic. History of nonobstructive coronary artery disease with no recent follow-up or testing. In February 2025 he was endorsing shortness of breath with exertion for which PCP ordered coronary CTA but patient has not completed it for reasons unknown, he had an ER visit in December for chest pain, and has risk factors of cigarette smoking, hyperlipidemia (stopped taking medications). Now anesthesia has referred him to cardiology for preoperative risk assessment. Echo will be ordered to evaluate for structural heart disease and left ventricular systolic function. Exercise treadmill stress test As long as the echocardiogram notes preserved LVEF and no significant valvular heart disease and GXT is low risk no additional testing will be warranted.  Upon completion of the tests preop letter will be provided to the patient. Patient is advised to hold aspirin  for 5 to 7 days prior to his surgery as per anesthesia recommendations and to restart once cleared by his  surgeon. Reemphasized importance of complete smoking cessation and improving his modifiable cardiovascular risk factors  Echo 12/13/2024:  IMPRESSIONS    1. Left ventricular ejection fraction, by estimation, is 50 to 55%. Left ventricular ejection fraction by 2D MOD biplane is 50.6 %. The left ventricle has low normal function. The left ventricle has no regional wall motion abnormalities. Left ventricular diastolic parameters were normal. The average left ventricular global longitudinal strain is -16.7 %. The global longitudinal strain is abnormal.  2. Right ventricular systolic function is normal. The right ventricular size is normal.  3. The mitral valve is normal in structure. No evidence of mitral valve regurgitation. No evidence of mitral stenosis.  4. The aortic valve is normal in structure. Aortic valve regurgitation is not visualized. No aortic stenosis is present.  5. The inferior vena cava is normal in size with <50% respiratory variability, suggesting right atrial pressure of 8 mmHg.  Exercise stress test 12/13/2024:    No ST deviation was noted. The ECG was negative for ischemia.   A Bruce protocol stress test was performed. Exercise capacity was excellent. Patient exercised for 9 min and 30 sec. Maximum HR of 146 bpm. MPHR 91.0%. Peak METS 10.9. The patient experienced no angina during the test. The test was stopped because the patient experienced dyspnea. The patient reported no symptoms during the stress test. Normal blood pressure and normal heart rate response noted during stress. Heart rate recovery was normal.   Prior study not available for comparison.   VS: BP (!) 137/101   Pulse 71   Temp 36.9 C (Oral)   Resp 16   Ht 6' (1.829 m)  Wt 106.6 kg   SpO2 99%   BMI 31.87 kg/m   PROVIDERS: Joshua Debby CROME, MD   LABS: Labs reviewed: Acceptable for surgery. (all labs ordered are listed, but only abnormal results are displayed)  Labs Reviewed  BASIC  METABOLIC PANEL WITH GFR - Abnormal; Notable for the following components:      Result Value   Glucose, Bld 121 (*)    Calcium  8.8 (*)    All other components within normal limits  CBC   CTA chest/abd/pelvis 12/18/125:  IMPRESSION: 1. No evidence of acute aortic syndrome.  EKG 12/11/24:  NSR    Past Medical History:  Diagnosis Date   Arthritis    BPH (benign prostatic hyperplasia)    Cataract    Chronic rhinitis    Complication of anesthesia    patient stated was told high tolerance last time   Coronary atherosclerosis of unspecified type of vessel, native or graft    Diverticulitis    Dysmetabolic syndrome X    Erectile dysfunction    Hypertrophy of prostate with urinary obstruction and other lower urinary tract symptoms (LUTS)    Obstructive sleep apnea (adult) (pediatric)    no CPAP, has lost weight   Prediabetes    Primary hypertension 11/03/2021   Problems related to high-risk sexual behavior    Pure hypercholesterolemia    Sickle cell trait    Sleep apnea    Tobacco use disorder     Past Surgical History:  Procedure Laterality Date   CATARACT EXTRACTION Bilateral    COSMETIC SURGERY     HERNIA REPAIR     UHR  AT [facility]  <5 YRS AGO    INCISION AND DRAINAGE ABSCESS / HEMATOMA OF BURSA / KNEE / THIGH     I&D of complex abscess,left axilla. I&D of large infected pilonidal abscess   KNEE ARTHROSCOPY WITH MENISCAL REPAIR Right 02/08/2022   Procedure: RIGHT KNEE PARTIAL MEDIAL MENISCECTOMY;  Surgeon: Doll Skates, MD;  Location: [facility];  Service: Orthopedics;  Laterality: Right;   LAPAROSCOPIC PARTIAL COLECTOMY N/A 03/10/2015   Procedure: LAPAROSCOPIC ASSISTED RIGHT COLECTOMY;  Surgeon: Donnice Bury, MD;  Location: [facility];  Service: General;  Laterality: N/A;   MASS EXCISION N/A 10/19/2015   Procedure: EXCISION BACK MASS;  Surgeon: Donnice Bury, MD;  Location: [facility];  Service: General;  Laterality: N/A;   PTCA   11/28/2008    MEDICATIONS:  aspirin  EC 81 MG tablet   fluticasone  (FLONASE ) 50 MCG/ACT nasal spray   lubiprostone  (AMITIZA ) 24 MCG capsule   Multiple Vitamin (MULTI-VITAMIN) tablet   OVER THE COUNTER MEDICATION   tadalafil  (CIALIS ) 5 MG tablet   No current facility-administered medications for this encounter.    Burnard CHRISTELLA Odis DEVONNA [facility] Short Stay/Anesthesiology [facility] Phone [phone] 12/19/2024 3:27 PM      "

## 2024-12-20 ENCOUNTER — Ambulatory Visit (HOSPITAL_COMMUNITY): Admission: RE | Admit: 2024-12-20 | Source: Ambulatory Visit | Admitting: Urology

## 2024-12-20 ENCOUNTER — Encounter (HOSPITAL_COMMUNITY)
Admission: RE | Disposition: A | Discharge status: Home / Self Care | Payer: Self-pay | Source: Home / Self Care | Attending: Urology

## 2024-12-20 ENCOUNTER — Encounter (HOSPITAL_COMMUNITY): Admission: RE | Payer: Self-pay | Source: Ambulatory Visit

## 2024-12-20 ENCOUNTER — Other Ambulatory Visit: Payer: Self-pay

## 2024-12-20 ENCOUNTER — Ambulatory Visit (HOSPITAL_COMMUNITY): Payer: Self-pay | Admitting: Medical

## 2024-12-20 ENCOUNTER — Ambulatory Visit (HOSPITAL_COMMUNITY): Payer: Self-pay

## 2024-12-20 ENCOUNTER — Encounter (HOSPITAL_COMMUNITY): Payer: Self-pay | Admitting: Urology

## 2024-12-20 ENCOUNTER — Ambulatory Visit (HOSPITAL_COMMUNITY)
Admission: RE | Admit: 2024-12-20 | Discharge: 2024-12-20 | Disposition: A | Discharge status: Home / Self Care | Attending: Urology | Admitting: Urology

## 2024-12-20 DIAGNOSIS — N138 Other obstructive and reflux uropathy: Secondary | ICD-10-CM | POA: Diagnosis present

## 2024-12-20 DIAGNOSIS — F17200 Nicotine dependence, unspecified, uncomplicated: Secondary | ICD-10-CM | POA: Insufficient documentation

## 2024-12-20 DIAGNOSIS — I10 Essential (primary) hypertension: Secondary | ICD-10-CM | POA: Insufficient documentation

## 2024-12-20 DIAGNOSIS — M1990 Unspecified osteoarthritis, unspecified site: Secondary | ICD-10-CM | POA: Insufficient documentation

## 2024-12-20 DIAGNOSIS — R350 Frequency of micturition: Secondary | ICD-10-CM | POA: Diagnosis not present

## 2024-12-20 DIAGNOSIS — I2510 Atherosclerotic heart disease of native coronary artery without angina pectoris: Secondary | ICD-10-CM | POA: Diagnosis not present

## 2024-12-20 DIAGNOSIS — R3912 Poor urinary stream: Secondary | ICD-10-CM | POA: Diagnosis not present

## 2024-12-20 DIAGNOSIS — N400 Enlarged prostate without lower urinary tract symptoms: Secondary | ICD-10-CM

## 2024-12-20 DIAGNOSIS — G4730 Sleep apnea, unspecified: Secondary | ICD-10-CM | POA: Insufficient documentation

## 2024-12-20 DIAGNOSIS — N401 Enlarged prostate with lower urinary tract symptoms: Secondary | ICD-10-CM | POA: Diagnosis not present

## 2024-12-20 DIAGNOSIS — K219 Gastro-esophageal reflux disease without esophagitis: Secondary | ICD-10-CM | POA: Insufficient documentation

## 2024-12-20 SURGERY — ABLATION, PROSTATE, TRANSURETHRAL, USING WATERJET
Anesthesia: General

## 2024-12-20 MED ORDER — DEXAMETHASONE SOD PHOSPHATE PF 10 MG/ML IJ SOLN
INTRAMUSCULAR | Status: DC | PRN
  Administered 2024-12-20: 5 mg via INTRAVENOUS

## 2024-12-20 MED ORDER — FENTANYL CITRATE (PF) 100 MCG/2ML IJ SOLN
INTRAMUSCULAR | Status: AC
  Filled 2024-12-20: qty 2

## 2024-12-20 MED ORDER — FENTANYL CITRATE (PF) 100 MCG/2ML IJ SOLN
INTRAMUSCULAR | Status: DC | PRN
  Administered 2024-12-20 (×4): 50 ug via INTRAVENOUS
  Administered 2024-12-20: 100 ug via INTRAVENOUS

## 2024-12-20 MED ORDER — SUGAMMADEX SODIUM 200 MG/2ML IV SOLN
INTRAVENOUS | Status: AC
  Filled 2024-12-20: qty 2

## 2024-12-20 MED ORDER — OXYCODONE HCL 5 MG PO TABS
5.0000 mg | ORAL_TABLET | Freq: Once | ORAL | Status: AC | PRN
  Administered 2024-12-20: 5 mg via ORAL

## 2024-12-20 MED ORDER — ONDANSETRON HCL 4 MG/2ML IJ SOLN: 4.0000 mg | Freq: Once | INTRAMUSCULAR | Status: DC | PRN

## 2024-12-20 MED ORDER — DEXAMETHASONE SOD PHOSPHATE PF 10 MG/ML IJ SOLN
INTRAMUSCULAR | Status: AC
  Filled 2024-12-20: qty 1

## 2024-12-20 MED ORDER — ONDANSETRON HCL 4 MG/2ML IJ SOLN
INTRAMUSCULAR | Status: DC | PRN
  Administered 2024-12-20: 4 mg via INTRAVENOUS

## 2024-12-20 MED ORDER — ONDANSETRON HCL 4 MG/2ML IJ SOLN
INTRAMUSCULAR | Status: AC
  Filled 2024-12-20: qty 2

## 2024-12-20 MED ORDER — SODIUM CHLORIDE 0.9 % IV SOLN
2.0000 g | INTRAVENOUS | Status: AC
  Administered 2024-12-20: 2 g via INTRAVENOUS
  Filled 2024-12-20: qty 20

## 2024-12-20 MED ORDER — OXYCODONE HCL 5 MG/5ML PO SOLN: 5.0000 mg | Freq: Once | ORAL | Status: AC | PRN

## 2024-12-20 MED ORDER — PROPOFOL 10 MG/ML IV BOLUS
INTRAVENOUS | Status: DC | PRN
  Administered 2024-12-20: 200 ug via INTRAVENOUS

## 2024-12-20 MED ORDER — TRANEXAMIC ACID-NACL 1000-0.7 MG/100ML-% IV SOLN
1000.0000 mg | INTRAVENOUS | Status: AC
  Administered 2024-12-20: 1000 mg via INTRAVENOUS
  Filled 2024-12-20: qty 100

## 2024-12-20 MED ORDER — LACTATED RINGERS IV SOLN: INTRAVENOUS | Status: DC

## 2024-12-20 MED ORDER — SODIUM CHLORIDE 0.9 % IR SOLN
Status: DC | PRN
  Administered 2024-12-20: 9000 mL

## 2024-12-20 MED ORDER — PROPOFOL 10 MG/ML IV BOLUS
INTRAVENOUS | Status: AC
  Filled 2024-12-20: qty 20

## 2024-12-20 MED ORDER — ACETAMINOPHEN 10 MG/ML IV SOLN
1000.0000 mg | Freq: Once | INTRAVENOUS | Status: DC | PRN
  Administered 2024-12-20: 1000 mg via INTRAVENOUS

## 2024-12-20 MED ORDER — STERILE WATER FOR IRRIGATION IR SOLN
Status: DC | PRN
  Administered 2024-12-20: 500 mL

## 2024-12-20 MED ORDER — MIDAZOLAM HCL 2 MG/2ML IJ SOLN
INTRAMUSCULAR | Status: AC
  Filled 2024-12-20: qty 2

## 2024-12-20 MED ORDER — LIDOCAINE HCL (PF) 2 % IJ SOLN
INTRAMUSCULAR | Status: AC
  Filled 2024-12-20: qty 5

## 2024-12-20 MED ORDER — ROCURONIUM BROMIDE 10 MG/ML (PF) SYRINGE
PREFILLED_SYRINGE | INTRAVENOUS | Status: AC
  Filled 2024-12-20: qty 10

## 2024-12-20 MED ORDER — CHLORHEXIDINE GLUCONATE 0.12 % MT SOLN
15.0000 mL | Freq: Once | OROMUCOSAL | Status: AC
  Administered 2024-12-20: 15 mL via OROMUCOSAL

## 2024-12-20 MED ORDER — DOXYCYCLINE HYCLATE 100 MG PO CAPS: 100.0000 mg | ORAL_CAPSULE | Freq: Every day | ORAL | 0 refills | Status: AC

## 2024-12-20 MED ORDER — OXYCODONE HCL 5 MG PO TABS
ORAL_TABLET | ORAL | Status: AC
  Filled 2024-12-20: qty 1

## 2024-12-20 MED ORDER — LIDOCAINE HCL (PF) 2 % IJ SOLN
INTRAMUSCULAR | Status: DC | PRN
  Administered 2024-12-20: 100 mg via INTRADERMAL

## 2024-12-20 MED ORDER — SUGAMMADEX SODIUM 200 MG/2ML IV SOLN
INTRAVENOUS | Status: DC | PRN
  Administered 2024-12-20: 200 mg via INTRAVENOUS

## 2024-12-20 MED ORDER — ACETAMINOPHEN 10 MG/ML IV SOLN
INTRAVENOUS | Status: AC
  Filled 2024-12-20: qty 100

## 2024-12-20 MED ORDER — 0.9 % SODIUM CHLORIDE (POUR BTL) OPTIME
TOPICAL | Status: DC | PRN
  Administered 2024-12-20: 1000 mL

## 2024-12-20 MED ORDER — ROCURONIUM BROMIDE 10 MG/ML (PF) SYRINGE
PREFILLED_SYRINGE | INTRAVENOUS | Status: DC | PRN
  Administered 2024-12-20: 20 mg via INTRAVENOUS
  Administered 2024-12-20: 60 mg via INTRAVENOUS

## 2024-12-20 MED ORDER — FENTANYL CITRATE (PF) 50 MCG/ML IJ SOSY
25.0000 ug | PREFILLED_SYRINGE | INTRAMUSCULAR | Status: DC | PRN
  Administered 2024-12-20 (×2): 50 ug via INTRAVENOUS

## 2024-12-20 MED ORDER — FENTANYL CITRATE (PF) 50 MCG/ML IJ SOSY
PREFILLED_SYRINGE | INTRAMUSCULAR | Status: AC
  Filled 2024-12-20: qty 2

## 2024-12-20 MED ORDER — MIDAZOLAM HCL (PF) 2 MG/2ML IJ SOLN
INTRAMUSCULAR | Status: DC | PRN
  Administered 2024-12-20: 2 mg via INTRAVENOUS

## 2024-12-20 NOTE — Anesthesia Procedure Notes (Signed)
 Procedure Name: Intubation Date/Time: 12/20/2024 12:08 PM  Performed by: Franchot Delon RAMAN, CRNAPre-anesthesia Checklist: Patient identified, Emergency Drugs available, Suction available and Patient being monitored Patient Re-evaluated:Patient Re-evaluated prior to induction Oxygen Delivery Method: Circle system utilized Induction Type: Inhalational induction Ventilation: Oral airway inserted - appropriate to patient size and Two handed mask ventilation required Laryngoscope Size: Miller and 2 Grade View: Grade I Tube type: Oral Tube size: 7.5 mm Number of attempts: 1 Airway Equipment and Method: Stylet Placement Confirmation: ETT inserted through vocal cords under direct vision, positive ETCO2 and breath sounds checked- equal and bilateral Tube secured with: Tape Dental Injury: Teeth and Oropharynx as per pre-operative assessment

## 2024-12-20 NOTE — Op Note (Signed)
 Preoperative diagnosis: BPH with lower urinary tract symptoms, weak stream, frequency  Postoperative diagnosis: Same   Procedure: Robotic water  jet ablation of the prostate   Surgeon: Nieves   Anesthesia: General   Indication for procedure: [name] is a 64 year old male with a history of symptomatic BPH. He presents today for surgical intervention.  Discussed preserving ejaculation not 100% guarantee but we will plan verumontanum protection.    Findings:  EUA-prostate about 50 g and smooth without hard area or nodule. Cystoscopy revealed obstructing lateral lobe hypertrophy.  No median lobe or high bladder neck.  Description of procedure:  He was brought to the operating room and placed supine on the operating table.  After adequate anesthesia he was placed lithotomy position. Timeout was performed to confirm the patient and procedure. The TRUS Stepper was mounted to the Articulating Arm and secured to OR bed. The ultrasound probe was attached to the stepper. Exam under anesthesia was performed and the TRUS was inserted per rectum.  There was no resistance. The ultrasound probe was aligned, and confirmation made that the prostate is centered and aligned using both transverse and sagittal views. The bladder neck, verumontanum and the central/transition zones were identified.  Genitalia were prepped and draped in the usual sterile fashion. The 57F AQUABEAM Handpiece is inserted into the prostatic urethra and a complete cystoscopic evaluation was performed by inspecting the prostate, bladder, and identifying the location of the verumontanum/external sphincter. The AQUABEAM Handpiece was secured to the Handpiece Articulating Arm. Confirmed alignment of AQUABEAM Handpiece and TRUS Probe to be parallel and colinear. Confirmation that AQUABEAM nozzle is centered and anterior of the bladder neck or the median lobe. The cystoscope was then retracted to visualize the verumontanum and external sphincter and the  cystoscope tip was positioned just proximal to the external sphincter. Reconfirmed alignment of the TRUS probe with the AQUABEAM Handpiece and compression applied with TRUS probe. Horizontal alignment of the Handpiece waterjet nozzle was performed. The Aquablation treatment zones were planned utilizing real-time TRUS to visualize the contour of the prostate and the depth and radial angles of resection were defined in the transverse view. In the sagittal view, the AQUABEAM nozzle is identified and position registered with software. The treatment contours were then adjusted to conform to the intended resection margins. The median lobe, bladder neck and verumontanum were marked and confirmed in the treatment contour.  We left a generous Veru protection zone.  The Aquablation Treatment was then started following the resection contour confirmed under ultrasound guidance.  TOTAL AQUABLATION RESECTION TIME: First pass 2 minutes 38 seconds, second pass 2 minutes 26 seconds  Once Aquablation resection was complete the aquabeam handpiece was carefully removed.  The continuous-flow sheath with the visual obturator was passed and then the loop and handle.  The trigone and the ureteral orifices were identified.  Resection of a very small amount of residual bladder neck tissue was done.  The bladder neck was identified at 6:00 and this was taken up to 12:00 with fulguration of the bladder neck and prostate for hemostasis.  Slight amount of anterior tissue was resected.  Similarly from 6:00 up to 12:00 on the left side of the bladder neck was identified by resecting some of the ablated tissue to identify the bladder neck and cauterize any bleeding.  Some anterior tissue on the left was resected.  This created excellent hemostasis.  All the chips were evacuated.  Ureteral orifices again identified and noted to be normal without injury.  The scope was  backed out and a 20 French hematuria catheter was placed with 30 cc in the  balloon.  The balloon was seated at the bladder neck and it was irrigated on light traction and noted to be clear to pink.  He was hooked up to CBI.  He was cleaned up and placed supine.  Catheter was placed on traction.  He was awakened and taken to the cover room in stable condition.  Complications: None  Blood loss: 30 mL  Specimens: None  Drains: 20 French three-way hematuria catheter with 30 cc in the balloon  Disposition: Patient stable to PACU-discussed the procedure, postop care follow-up with Lenora.

## 2024-12-20 NOTE — Anesthesia Postprocedure Evaluation (Signed)
"   Anesthesia Post Note  Patient: [name]  Procedure(s) Performed: ABLATION, PROSTATE, TRANSURETHRAL, USING WATERJET     Patient location during evaluation: PACU Anesthesia Type: General Level of consciousness: awake and alert Pain management: pain level controlled Vital Signs Assessment: post-procedure vital signs reviewed and stable Respiratory status: spontaneous breathing, nonlabored ventilation, respiratory function stable and patient connected to nasal cannula oxygen Cardiovascular status: blood pressure returned to baseline and stable Postop Assessment: no apparent nausea or vomiting Anesthetic complications: no   There were no known notable events for this encounter.  Last Vitals:  Vitals:   12/20/24 1430 12/20/24 1445  BP: (!) 151/118 (!) 141/92  Pulse: 67 61  Resp: (!) 21 15  Temp:    SpO2: 98% 97%    Last Pain:  Vitals:   12/20/24 1415  TempSrc:   PainSc: Asleep                 Lynwood MARLA Cornea      "

## 2024-12-20 NOTE — Transfer of Care (Signed)
 Immediate Anesthesia Transfer of Care Note  Patient: [name]  Procedure(s) Performed: ABLATION, PROSTATE, TRANSURETHRAL, USING WATERJET  Patient Location: PACU  Anesthesia Type:General  Level of Consciousness: awake, alert , and oriented  Airway & Oxygen Therapy: Patient Spontanous Breathing and Patient connected to face mask oxygen  Post-op Assessment: Report given to RN and Post -op Vital signs reviewed and stable  Post vital signs: Reviewed and stable  Last Vitals:  Vitals Value Taken Time  BP 144/98 12/20/24 13:18  Temp    Pulse 74 12/20/24 13:21  Resp 14 12/20/24 13:21  SpO2 100 % 12/20/24 13:21  Vitals shown include unfiled device data.  Last Pain:  Vitals:   12/20/24 1038  TempSrc:   PainSc: 0-No pain         Complications: There were no known notable events for this encounter.

## 2024-12-20 NOTE — Discharge Instructions (Signed)
 Robotic water  jet ablation of the Prostate, Care After The following information offers guidance on how to care for yourself after your procedure. Your health care provider may also give you more specific instructions. If you have problems or questions, contact your health care provider. What can I expect after the procedure? After the procedure, it is common to have: Mild pain in your lower abdomen. Soreness or mild discomfort in your penis or when you urinate. This is from having the catheter inserted during the procedure. A sudden urge to urinate (urgency). A need to urinate often. A small amount of blood in your urine. You may notice some small blood clots in your urine. These are normal. Follow these instructions at home: Medicines Take over-the-counter and prescription medicines only as told by your health care provider. If you were prescribed an antibiotic medicine, take it as told by your health care provider. Do not stop taking the antibiotic even if you start to feel better. Activity  Rest as told by your health care provider. Avoid sitting for a long time without moving. Get up to take short walks every 1-2 hours. This is important to improve blood flow and breathing. Ask for help if you feel weak or unsteady. You may increase your physical activity gradually as you start to feel better. Do not drive or operate machinery until your health care provider says that it is safe. Do not ride in a car for long periods of time, or as told by your health care provider. Avoid intense physical activity for as long as told by your health care provider. Do not lift anything that is heavier than 10 lb (4.5 kg), or the limit that you are told, until your health care provider says that it is safe. Do not have sex until your health care provider approves. Return to your normal activities as told by your health care provider. Ask your health care provider what activities are safe for you. Preventing  constipation You may need to take these actions to prevent or treat constipation: Drink enough fluid to keep your urine pale yellow. Take over-the-counter or prescription medicines. Eat foods that are high in fiber, such as beans, whole grains, and fresh fruits and vegetables. Limit foods that are high in fat and processed sugars, such as fried or sweet foods.   General instructions Do not strain when you have a bowel movement. Straining may lead to bleeding from the prostate. This may cause blood clots and trouble urinating. Do not use any products that contain nicotine or tobacco. These products include cigarettes, chewing tobacco, and vaping devices, such as e-cigarettes. If you need help quitting, ask your health care provider. If you go home with a tube draining your urine (urinary catheter), care for the catheter as told by your health care provider. Wear compression stockings as told by your health care provider. These stockings help to prevent blood clots and reduce swelling in your legs. Keep all follow-up visits. This is important. Contact a health care provider if: You have signs of infection, such as: Fever or chills. Urine that smells very bad. Swelling around your urethra that is getting worse. Swelling in your penis or testicles. You have difficulty urinating. You have pain that gets worse or does not improve with medicine. You have blood in your urine that does not go away after 1 week of resting and drinking more fluids. You have trouble having a bowel movement. You have trouble having or keeping an erection. No semen  comes out during orgasm (dry ejaculation). You have a urinary catheter in place, and you have: Spasms or pain. Problems with your catheter or your catheter is blocked. Get help right away if: You are unable to urinate. You are having more blood clots in your urine instead of fewer. You have: Large blood clots. A lot of blood in your urine. Pain in your  back or lower abdomen. You have difficulty breathing or shortness of breath. You develop swelling or pain in your leg. These symptoms may be an emergency. Get help right away. Call 911. Do not wait to see if the symptoms will go away. Do not drive yourself to the hospital. Summary After the procedure, it is common to have a small amount of blood in your urine. Follow restrictions about lifting and sexual activity as told by your health care provider. Ask what activities are safe for you. Keep all follow-up visits. This is important. This information is not intended to replace advice given to you by your health care provider. Make sure you discuss any questions you have with your health care provider. Document Revised: 08/10/2021 Document Reviewed: 08/10/2021 Elsevier Patient Education  2025 [employer].

## 2024-12-24 ENCOUNTER — Telehealth: Payer: Self-pay

## 2024-12-24 NOTE — Telephone Encounter (Signed)
 Copied from CRM #[id number]. Topic: Clinical - Medication Question >> Dec 24, 2024  4:04 PM Eva FALCON wrote: Reason for CRM: Pt states his heart doctor needs him to restart his cholesterol medication, he's unsure the name of it, but states his pcp has prescribed it for him. Pt is wondering if provider could assist and send it to [facility] on [address]? Please call patient when complete or any other questions or concerns.

## 2024-12-24 NOTE — Telephone Encounter (Signed)
 Unable to reach patient. LMTRC

## 2024-12-24 NOTE — Telephone Encounter (Signed)
 He needs to be seen

## 2024-12-24 NOTE — Telephone Encounter (Signed)
 Please advise.

## 2024-12-27 NOTE — Telephone Encounter (Signed)
"  Patient has been scheduled for an OV  "

## 2025-02-03 ENCOUNTER — Encounter: Admitting: Internal Medicine

## 2025-02-10 ENCOUNTER — Ambulatory Visit: Admitting: Family Medicine
